# Patient Record
Sex: Male | Born: 1960 | Race: Black or African American | Hispanic: No | Marital: Married | Smoking: Former smoker
Health system: Southern US, Community
[De-identification: ages and names within clinical notes are randomized; demographics above are authoritative.]

## PROBLEM LIST (undated history)

## (undated) ENCOUNTER — Emergency Department (HOSPITAL_COMMUNITY): Discharge status: Absent without leave | Payer: 59

## (undated) DIAGNOSIS — D496 Neoplasm of unspecified behavior of brain: Secondary | ICD-10-CM

## (undated) DIAGNOSIS — M542 Cervicalgia: Secondary | ICD-10-CM

## (undated) DIAGNOSIS — E7800 Pure hypercholesterolemia, unspecified: Secondary | ICD-10-CM

## (undated) DIAGNOSIS — G4730 Sleep apnea, unspecified: Secondary | ICD-10-CM

## (undated) DIAGNOSIS — H538 Other visual disturbances: Secondary | ICD-10-CM

## (undated) DIAGNOSIS — M25519 Pain in unspecified shoulder: Secondary | ICD-10-CM

## (undated) DIAGNOSIS — I10 Essential (primary) hypertension: Secondary | ICD-10-CM

## (undated) DIAGNOSIS — R42 Dizziness and giddiness: Secondary | ICD-10-CM

## (undated) HISTORY — PX: ANKLE SURGERY: SHX546

## (undated) HISTORY — PX: SINOSCOPY: SHX187

## (undated) HISTORY — PX: OTHER SURGICAL HISTORY: SHX169

---

## 2008-03-06 HISTORY — PX: HERNIA REPAIR: SHX51

## 2012-03-06 HISTORY — PX: OTHER SURGICAL HISTORY: SHX169

## 2012-08-07 DIAGNOSIS — I10 Essential (primary) hypertension: Secondary | ICD-10-CM

## 2012-09-20 DIAGNOSIS — D329 Benign neoplasm of meninges, unspecified: Secondary | ICD-10-CM | POA: Insufficient documentation

## 2012-09-28 ENCOUNTER — Emergency Department (HOSPITAL_COMMUNITY)
Admission: EM | Admit: 2012-09-28 | Discharge: 2012-09-28 | Disposition: A | Discharge status: Home / Self Care | Payer: BC Managed Care – PPO | Attending: Emergency Medicine | Admitting: Emergency Medicine

## 2012-09-28 ENCOUNTER — Emergency Department (HOSPITAL_COMMUNITY): Payer: BC Managed Care – PPO

## 2012-09-28 ENCOUNTER — Encounter (HOSPITAL_COMMUNITY): Payer: Self-pay

## 2012-09-28 DIAGNOSIS — G9389 Other specified disorders of brain: Secondary | ICD-10-CM

## 2012-09-28 DIAGNOSIS — Z8639 Personal history of other endocrine, nutritional and metabolic disease: Secondary | ICD-10-CM | POA: Insufficient documentation

## 2012-09-28 DIAGNOSIS — Z862 Personal history of diseases of the blood and blood-forming organs and certain disorders involving the immune mechanism: Secondary | ICD-10-CM | POA: Insufficient documentation

## 2012-09-28 DIAGNOSIS — R220 Localized swelling, mass and lump, head: Secondary | ICD-10-CM | POA: Insufficient documentation

## 2012-09-28 DIAGNOSIS — Z85841 Personal history of malignant neoplasm of brain: Secondary | ICD-10-CM | POA: Insufficient documentation

## 2012-09-28 DIAGNOSIS — G936 Cerebral edema: Secondary | ICD-10-CM | POA: Insufficient documentation

## 2012-09-28 DIAGNOSIS — Z9889 Other specified postprocedural states: Secondary | ICD-10-CM | POA: Insufficient documentation

## 2012-09-28 DIAGNOSIS — G8918 Other acute postprocedural pain: Secondary | ICD-10-CM

## 2012-09-28 DIAGNOSIS — I10 Essential (primary) hypertension: Secondary | ICD-10-CM | POA: Insufficient documentation

## 2012-09-28 HISTORY — DX: Pure hypercholesterolemia, unspecified: E78.00

## 2012-09-28 HISTORY — DX: Neoplasm of unspecified behavior of brain: D49.6

## 2012-09-28 HISTORY — DX: Essential (primary) hypertension: I10

## 2012-09-28 LAB — BASIC METABOLIC PANEL
BUN: 16 mg/dL (ref 6–23)
GFR calc non Af Amer: 71 mL/min — ABNORMAL LOW (ref >90)
Glucose, Bld: 112 mg/dL — ABNORMAL HIGH (ref 70–99)
Potassium: 3.4 mEq/L — ABNORMAL LOW (ref 3.5–5.1)

## 2012-09-28 LAB — CBC WITH DIFFERENTIAL/PLATELET
Eosinophils Absolute: 0.1 10*3/uL (ref 0.0–0.7)
Eosinophils Relative: 2 % (ref 0–5)
HCT: 34.5 % — ABNORMAL LOW (ref 39.0–52.0)
Hemoglobin: 12.3 g/dL — ABNORMAL LOW (ref 13.0–17.0)
Lymphs Abs: 1.6 10*3/uL (ref 0.7–4.0)
MCH: 32.5 pg (ref 26.0–34.0)
MCV: 91 fL (ref 78.0–100.0)
Monocytes Relative: 8 % (ref 3–12)
Neutrophils Relative %: 65 % (ref 43–77)
RBC: 3.79 MIL/uL — ABNORMAL LOW (ref 4.22–5.81)

## 2012-09-28 IMAGING — CT CT HEAD W/O CM
1 series · 15 of 30 positions shown, 19 images · non-contrast
Comparison: Head CT [DATE].

CLINICAL DATA: Headache with swelling at surgical site status post
craniotomy for brain tumor removal [DATE].

CT HEAD WITHOUT CONTRAST
TECHNIQUE: Contiguous axial images were obtained from the base of
the skull through the vertex without contrast.

[Series 2: headseq 4.8 h37s · axial · 0.46mm/px · z∈[+143,+301]mm · 15 of 36 slices shown, 19 images]
[im 2/36  brain]
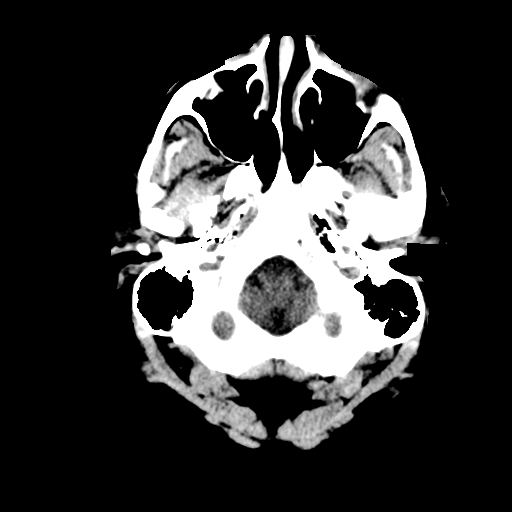
[im 2/36  bone]
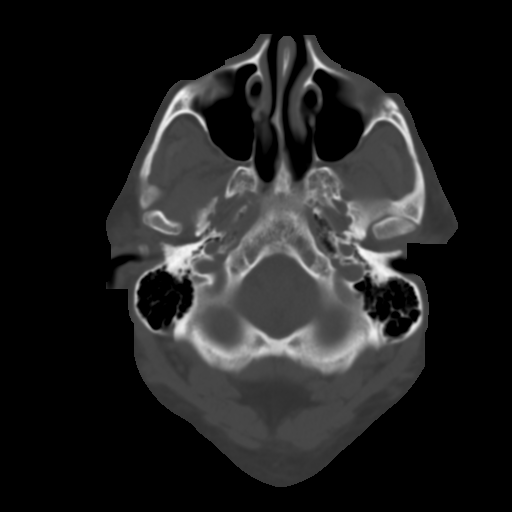
[im 4/36  brain]
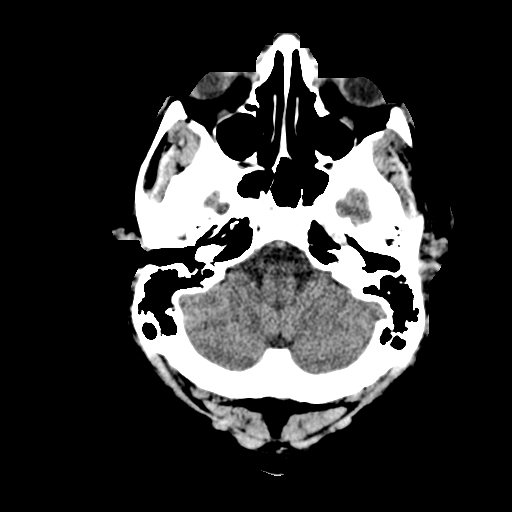
[im 7/36  brain]
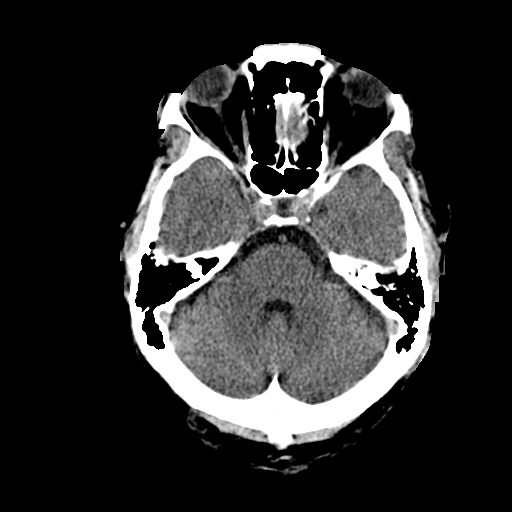
[im 9/36  brain]
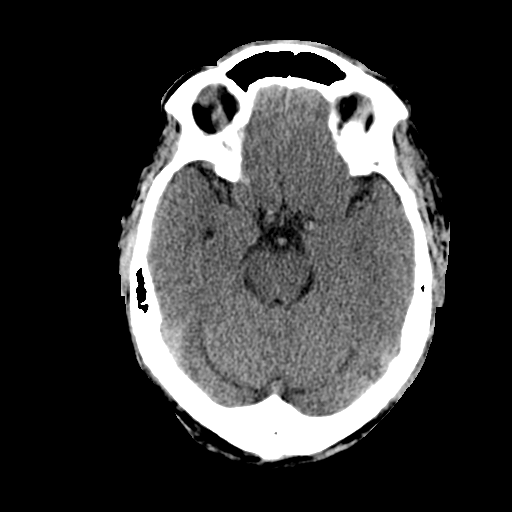
[im 11/36  brain]
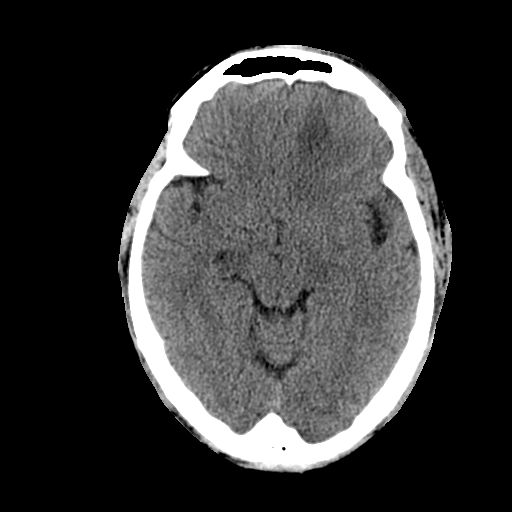
[im 11/36  bone]
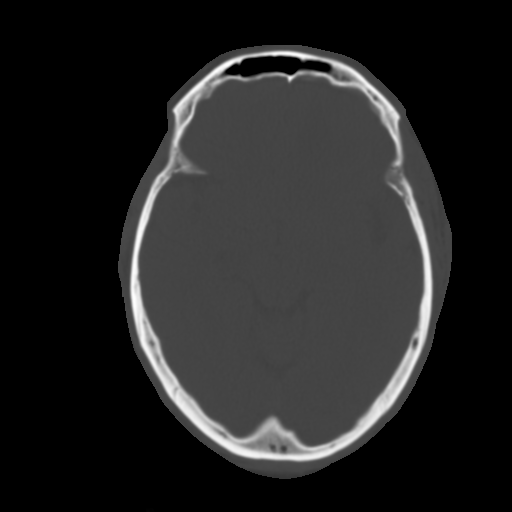
[im 14/36  brain]
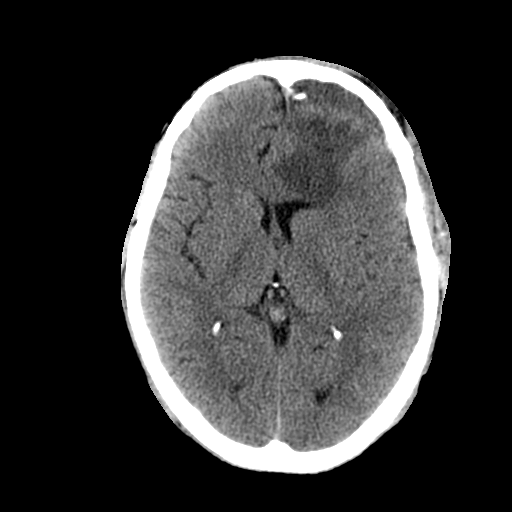
[im 16/36  brain]
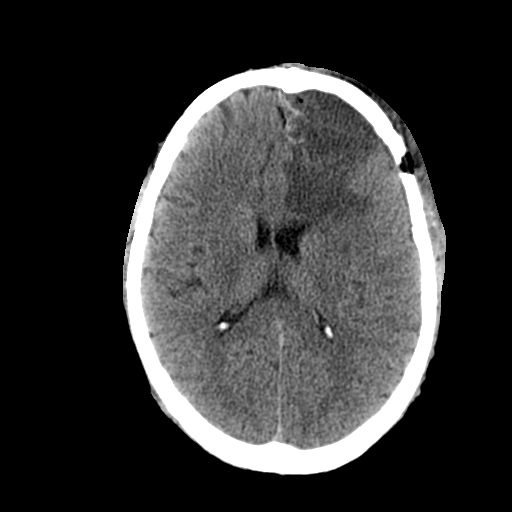
[im 19/36  brain]
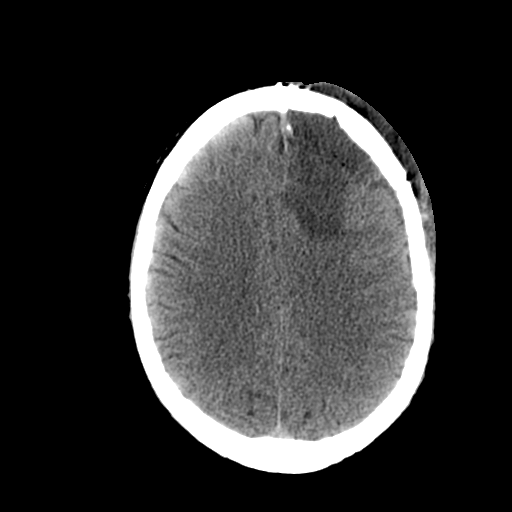
[im 20/36  brain]
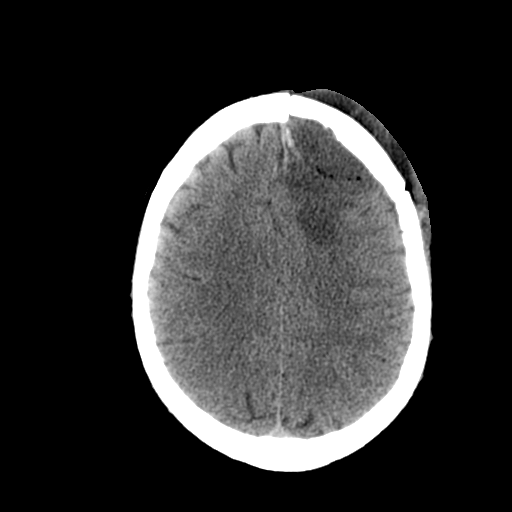
[im 20/36  bone]
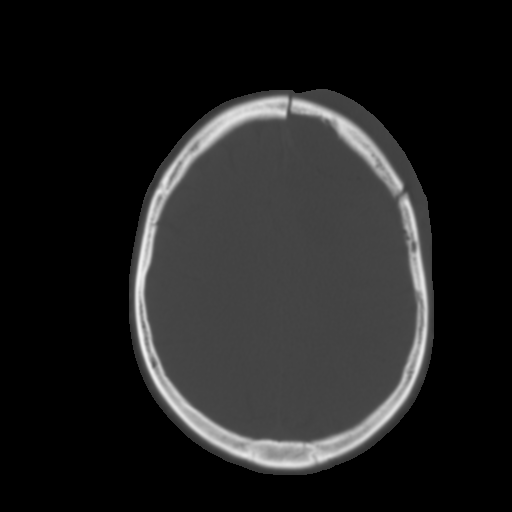
[im 22/36  brain]
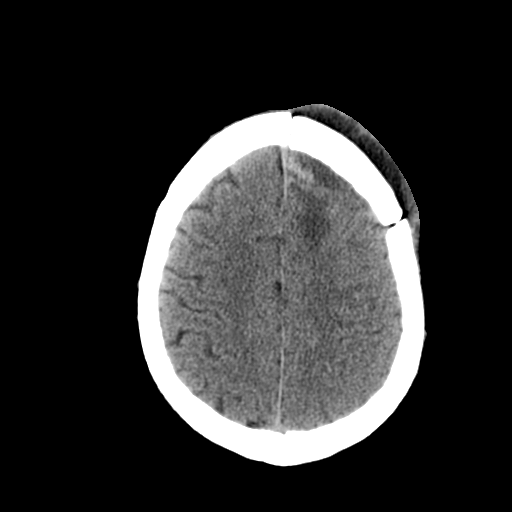
[im 25/36  brain]
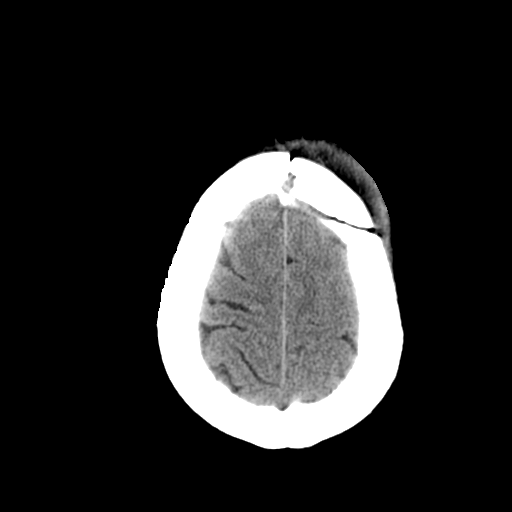
[im 27/36  brain]
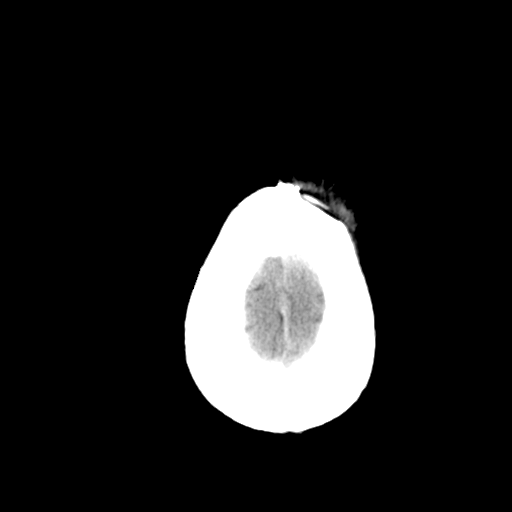
[im 29/36  brain]
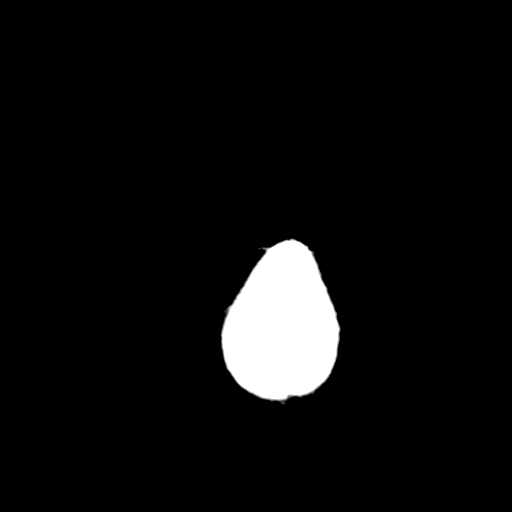
[im 29/36  bone]
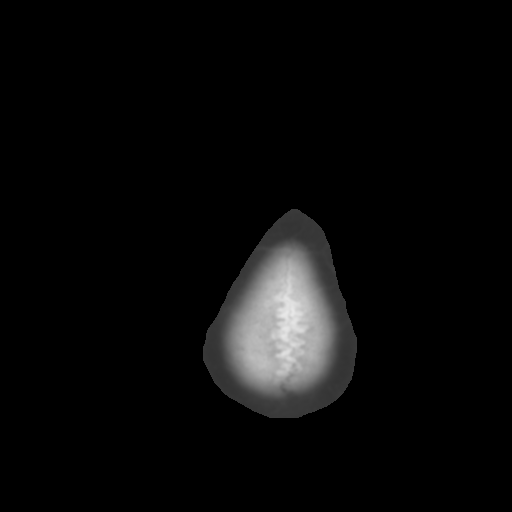
[im 32/36  brain]
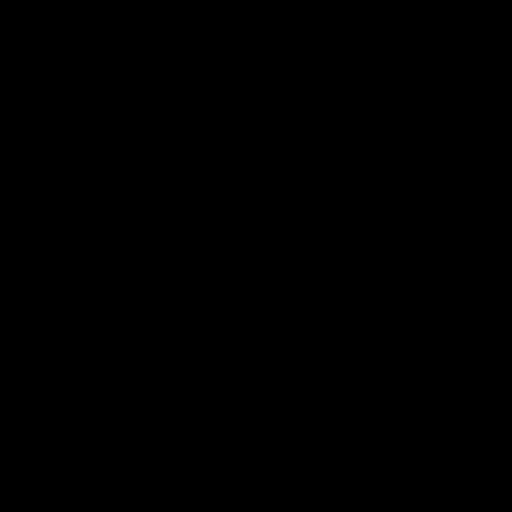
[im 34/36  brain]
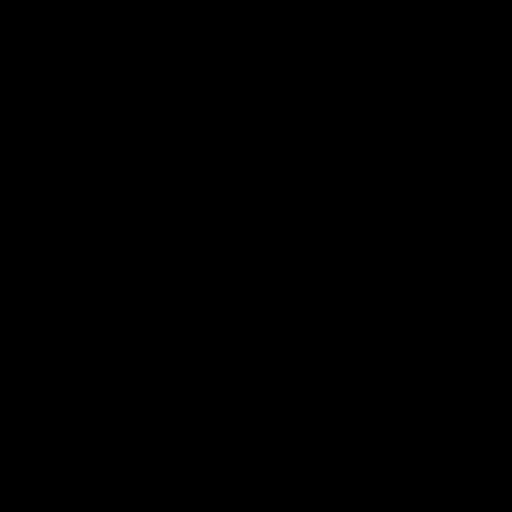

[15 of 30 positions shown; findings below may reference images not displayed]

FINDINGS: Patient is status post interval left frontal craniotomy
for resection of a hyperdense left frontal lobe mass.  There is
residual low density within the left frontal white matter.  There
is some residual left to right midline shift, now measuring 4 mm.
At the site of the resected tumor, there is an extra-axial fluid
collection measuring 1.7 x 4.7 cm on image 20.  There are small air
bubbles within this collection.  There is no high density component
to suggest recent hemorrhage.  There is an adjacent subgaleal fluid
collection in the left frontal scalp measuring up to 8 mm in
thickness.

No additional extra-axial fluid collections are demonstrated.
There is no evidence of acute infarction or hydrocephalus.  The
visualized paranasal sinuses, mastoid air cells and middle ears are
clear.  No osseous destruction is identified at the craniotomy.
IMPRESSION: 1.  Interval craniotomy for resection of a left frontal lobe mass.
2.  Lenticular shaped extra-axial fluid collection within the
craniotomy bed contains small air bubbles and is associated with an
adjacent subgaleal fluid collection.  These findings are suspicious
for possible postoperative infection with epidural abscess
formation.
3.  No evidence of acute intracranial hemorrhage or progressive
mass effect.

These results were called by telephone on [DATE] at [4R] hours
to Dr. FRUSTI, who verbally acknowledged these results.

## 2012-09-28 MED ORDER — ACETAMINOPHEN 325 MG PO TABS
650.0000 mg | ORAL_TABLET | Freq: Once | ORAL | Status: AC
  Administered 2012-09-28: 650 mg via ORAL
  Filled 2012-09-28: qty 2

## 2012-09-28 NOTE — ED Notes (Signed)
Pt had removal of a brain tumor on July 8th at [facility].  [place] were removed July 18th.  Pt noticed swelling around incision worse since last night.  Also c/o pain at the site and intermittent sharp pain in left eye.  Pt has problems with dizziness and walking, but is not new since the surgery per EMS.

## 2012-09-28 NOTE — ED Provider Notes (Signed)
CSN: [id number]     Arrival date & time 09/28/12  1432 History  This chart was scribed for Enid Skeens, MD by Bennett Scrape, ED Scribe. This patient was seen in room APA07/APA07 and the patient's care was started at 3:00 PM.   First MD Initiated Contact with Patient 09/28/12 1457     Chief Complaint  Patient presents with  . Edema    The history is provided by the patient. No language interpreter was used.    HPI Comments: Micheal Ross is a 52 y.o. male brought in by ambulance, who presents to the Emergency Department complaining of waxing and waning angioedema around the left forehead and perioribtal region near a brain surgery site that started overnight last night. Pt denies having prior episodes of similar symptoms. He recently had a brain tumor removed on 09/10/12 at [facility] with the staples removed on 09/20/12 with no complications. He states that he has ongoing intermittently felt pain behind the left eye described as sharp and stabbing since the surgery but denies any changes. He has a follow up appointment on 09/26/12 and denies any changes around the surgery site until today. He denies weakness, numbness, urinary symptoms, visual changes, rash, itching, fevers or chills as associated symptoms. He denies having a h/o CHF or kidney disease. Pt denies smoking and alcohol use.  Past Medical History  Diagnosis Date  . Brain tumor   . Hypertension   . Hypercholesterolemia    Past Surgical History  Procedure Laterality Date  . Brain tumor removal    . Hernia repair    . Ankle surgery     No family history on file. History  Substance Use Topics  . Smoking status: Never Smoker   . Smokeless tobacco: Not on file  . Alcohol Use: No    Review of Systems  HENT: Positive for facial swelling.   Eyes: Negative for visual disturbance.  Gastrointestinal: Negative for abdominal pain.  Neurological: Negative for speech difficulty, weakness and numbness.  All other systems  reviewed and are negative.    Allergies  Review of patient's allergies indicates no known allergies.  Home Medications  No current outpatient prescriptions on file.  Triage Vitals: BP 141/89  Pulse 111  Temp(Src) 98.8 F (37.1 C) (Oral)  Resp 23  SpO2 96%  Physical Exam  Nursing note and vitals reviewed. Constitutional: He is oriented to person, place, and time. He appears well-developed and well-nourished. No distress.  HENT:  Head: Atraumatic.  Swelling and mild tenderness to the left forehead that extends to the left upper orbit  Eyes: Pupils are equal, round, and reactive to light.  Neck: Neck supple. No tracheal deviation present.  Cardiovascular: Normal rate and regular rhythm.   No murmur heard. Pulmonary/Chest: Effort normal and breath sounds normal. No respiratory distress. He has no wheezes.  Abdominal: Soft. Bowel sounds are normal. There is no tenderness.  Musculoskeletal: Normal range of motion. He exhibits edema (no ankle swelling). He exhibits no tenderness.  Neurological: He is alert and oriented to person, place, and time. No cranial nerve deficit or sensory deficit. Coordination normal. GCS eye subscore is 4. GCS verbal subscore is 5. GCS motor subscore is 6.  No arm drift, sensation is grossly intact, equal smile  Skin: Skin is warm and dry. No rash noted.  Psychiatric: He has a normal mood and affect. His behavior is normal.    ED Course   Procedures (including critical care time)  DIAGNOSTIC STUDIES: Oxygen Saturation is  96% on room air, normal by my interpretation.    COORDINATION OF CARE: 3:03 PM-Advised pt that the symptoms are most likely changes from surgery. Discussed treatment plan which includes CT of head with pt at bedside and pt agreed to plan. Advised pt that he will have to follow up as scheduled and pt agreed.  Labs Reviewed  CBC WITH DIFFERENTIAL - Abnormal; Notable for the following:    RBC 3.79 (*)    Hemoglobin 12.3 (*)    HCT  34.5 (*)    All other components within normal limits  BASIC METABOLIC PANEL - Abnormal; Notable for the following:    Potassium 3.4 (*)    Glucose, Bld 112 (*)    GFR calc non Af Amer 71 (*)    GFR calc Af Amer 82 (*)    All other components within normal limits  CULTURE, BLOOD (ROUTINE X 2)  CULTURE, BLOOD (ROUTINE X 2)   No results found. No diagnosis found.  MDM  I personally performed the services described in this documentation, which was scribed in my presence. The recorded information has been reviewed and is accurate.  Pt well appearing, mild fluid collection without clinical signs of infection. No fevers, normal WBC.  Spoke with radiologist concern for post op fluid collection vs abscess/ infection.  Spoke with NSGY on call at [facility], this is common on CT, as long as no clinical signs of infection will fup in office, no ax at this time, no transfer.   Updated pt.  Well appearing.  DC Ct Head Wo Contrast  09/28/2012   *RADIOLOGY REPORT*  Clinical Data: Headache with swelling at surgical site status post craniotomy for brain tumor removal 09/10/2012.  CT HEAD WITHOUT CONTRAST  Technique:  Contiguous axial images were obtained from the base of the skull through the vertex without contrast.  Comparison: Head CT 09/08/2012.  Findings: Patient is status post interval left frontal craniotomy for resection of a hyperdense left frontal lobe mass.  There is residual low density within the left frontal white matter.  There is some residual left to right midline shift, now measuring 4 mm. At the site of the resected tumor, there is an extra-axial fluid collection measuring 1.7 x 4.7 cm on image 20.  There are small air bubbles within this collection.  There is no high density component to suggest recent hemorrhage.  There is an adjacent subgaleal fluid collection in the left frontal scalp measuring up to 8 mm in thickness.  No additional extra-axial fluid collections are demonstrated. There is  no evidence of acute infarction or hydrocephalus.  The visualized paranasal sinuses, mastoid air cells and middle ears are clear.  No osseous destruction is identified at the craniotomy.  IMPRESSION:  1.  Interval craniotomy for resection of a left frontal lobe mass. 2.  Lenticular shaped extra-axial fluid collection within the craniotomy bed contains small air bubbles and is associated with an adjacent subgaleal fluid collection.  These findings are suspicious for possible postoperative infection with epidural abscess formation. 3.  No evidence of acute intracranial hemorrhage or progressive mass effect.  These results were called by telephone on 09/28/2012 at 1625 hours to Dr. Jodi Mourning, who verbally acknowledged these results.   Original Report Authenticated By: Carey Bullocks, M.D.    Enid Skeens, MD 09/28/12 [phone]

## 2012-09-28 NOTE — ED Notes (Signed)
Pt presents with c/o post-op swelling/edema at frontal scalp incision that was placed on 7/8 and staple removal 7/18. Pt ambulates with unsteady gait, spouse reports pts gait is improving as well is his speech.  Denies N/V and fever at this time. PERTL. NAD noted

## 2012-10-03 LAB — CULTURE, BLOOD (ROUTINE X 2)
Culture: NO GROWTH
Culture: NO GROWTH

## 2017-04-20 DIAGNOSIS — R197 Diarrhea, unspecified: Secondary | ICD-10-CM | POA: Insufficient documentation

## 2017-04-20 DIAGNOSIS — J101 Influenza due to other identified influenza virus with other respiratory manifestations: Secondary | ICD-10-CM | POA: Insufficient documentation

## 2017-04-20 DIAGNOSIS — J159 Unspecified bacterial pneumonia: Secondary | ICD-10-CM | POA: Insufficient documentation

## 2018-03-22 ENCOUNTER — Ambulatory Visit: Payer: Self-pay | Admitting: Family Medicine

## 2018-03-29 ENCOUNTER — Ambulatory Visit (INDEPENDENT_AMBULATORY_CARE_PROVIDER_SITE_OTHER): Payer: PRIVATE HEALTH INSURANCE | Admitting: Family Medicine

## 2018-03-29 ENCOUNTER — Encounter: Payer: Self-pay | Admitting: Family Medicine

## 2018-03-29 VITALS — BP 119/72 | HR 80 | Temp 97.0°F | Ht 70.0 in | Wt 321.6 lb

## 2018-03-29 DIAGNOSIS — Z125 Encounter for screening for malignant neoplasm of prostate: Secondary | ICD-10-CM

## 2018-03-29 DIAGNOSIS — Z1159 Encounter for screening for other viral diseases: Secondary | ICD-10-CM

## 2018-03-29 DIAGNOSIS — E782 Mixed hyperlipidemia: Secondary | ICD-10-CM

## 2018-03-29 DIAGNOSIS — I10 Essential (primary) hypertension: Secondary | ICD-10-CM | POA: Insufficient documentation

## 2018-03-29 DIAGNOSIS — E785 Hyperlipidemia, unspecified: Secondary | ICD-10-CM | POA: Insufficient documentation

## 2018-03-29 MED ORDER — LOVASTATIN 20 MG PO TABS: 20.0000 mg | ORAL_TABLET | Freq: Every morning | ORAL | 1 refills | Status: DC

## 2018-03-29 MED ORDER — LISINOPRIL-HYDROCHLOROTHIAZIDE 20-25 MG PO TABS: 1.0000 | ORAL_TABLET | Freq: Every day | ORAL | 1 refills | Status: DC

## 2018-03-29 NOTE — Progress Notes (Signed)
BP 119/72   Pulse 80   Temp (!) 97 F (36.1 C) (Oral)   Ht 5' 10"  (1.778 m)   Wt (!) 321 lb 9.6 oz (145.9 kg)   BMI 46.14 kg/m    Subjective:    Patient ID: Micheal Sabal., male    DOB: April 02, 1960, 58 y.o.   MRN: [id number]  HPI: Micheal Banks. is a 58 y.o. male presenting on 03/29/2018 for New Patient (Initial Visit) (Patient has no complaints today- normal check up . Patient is fasting for lab work.) and Establish Care   HPI Hypertension Patient is currently on lisinopril hydrochlorothiazide, and their blood pressure today is 119/72. Patient denies any lightheadedness or dizziness. Patient denies headaches, blurred vision, chest pains, shortness of breath, or weakness. Denies any side effects from medication and is content with current medication.   Hyperlipidemia Patient is coming in for recheck of his hyperlipidemia. The patient is currently taking lovastatin. They deny any issues with myalgias or history of liver damage from it. They deny any focal numbness or weakness or chest pain.   Possible hepatitis C exposure, his wife was just diagnosed with hepatitis C chronic and they have been married for 36 years so he does not know if he has been exposed to or not and has never been tested.  Relevant past medical, surgical, family and social history reviewed and updated as indicated. Interim medical history since our last visit reviewed. Allergies and medications reviewed and updated.  Review of Systems  Constitutional: Negative for chills and fever.  HENT: Negative for ear pain and tinnitus.   Eyes: Negative for pain and discharge.  Respiratory: Negative for cough, shortness of breath and wheezing.   Cardiovascular: Negative for chest pain, palpitations and leg swelling.  Gastrointestinal: Negative for abdominal pain, blood in stool, constipation and diarrhea.  Genitourinary: Negative for dysuria and hematuria.  Musculoskeletal: Negative for back pain, gait problem  and myalgias.  Skin: Negative for rash.  Neurological: Negative for dizziness, weakness and headaches.  Psychiatric/Behavioral: Negative for suicidal ideas.  All other systems reviewed and are negative.  Per HPI unless specifically indicated above  Social History   Socioeconomic History  . Marital status: Married    Spouse name: Not on file  . Number of children: Not on file  . Years of education: Not on file  . Highest education level: Not on file  Occupational History  . Not on file  Social Needs  . Financial resource strain: Not on file  . Food insecurity:    Worry: Not on file    Inability: Not on file  . Transportation needs:    Medical: Not on file    Non-medical: Not on file  Tobacco Use  . Smoking status: Former Smoker    Packs/day: 0.50    Years: 2.00    Pack years: 1.00    Last attempt to quit: 03/30/1983    Years since quitting: 35.0  . Smokeless tobacco: Never Used  Substance and Sexual Activity  . Alcohol use: Yes    Comment: occ  . Drug use: No  . Sexual activity: Not on file    Comment: married for 1985, 6 grown kids  Lifestyle  . Physical activity:    Days per week: Not on file    Minutes per session: Not on file  . Stress: Not on file  Relationships  . Social connections:    Talks on phone: Not on file    Gets together: Not  on file    Attends religious service: Not on file    Active member of club or organization: Not on file    Attends meetings of clubs or organizations: Not on file    Relationship status: Not on file  . Intimate partner violence:    Fear of current or ex partner: Not on file    Emotionally abused: Not on file    Physically abused: Not on file    Forced sexual activity: Not on file  Other Topics Concern  . Not on file  Social History Narrative  . Not on file    Past Surgical History:  Procedure Laterality Date  . ANKLE SURGERY    . brain tumor removal    . HERNIA REPAIR  9371   umbilical    Family History    Problem Relation Age of Onset  . Thyroid disease Mother   . COPD Father   . Cancer Maternal Aunt   . Cancer Maternal Uncle        back  . Cancer Paternal Uncle        throat   . Cancer Cousin        prostate    Allergies as of 03/29/2018   No Known Allergies     Medication List       Accurate as of March 29, 2018 10:30 AM. Always use your most recent med list.        lisinopril-hydrochlorothiazide 20-25 MG tablet Commonly known as:  PRINZIDE,ZESTORETIC Take 1 tablet by mouth daily.   lovastatin 20 MG tablet Commonly known as:  MEVACOR Take 1 tablet (20 mg total) by mouth every morning.   ONE-A-DAY MENS 50+ ADVANTAGE Tabs Take 1 tablet by mouth daily.          Objective:    BP 119/72   Pulse 80   Temp (!) 97 F (36.1 C) (Oral)   Ht 5' 10"  (1.778 m)   Wt (!) 321 lb 9.6 oz (145.9 kg)   BMI 46.14 kg/m   Wt Readings from Last 3 Encounters:  03/29/18 (!) 321 lb 9.6 oz (145.9 kg)    Physical Exam Vitals signs reviewed.  Constitutional:      General: He is not in acute distress.    Appearance: He is well-developed. He is not diaphoretic.  HENT:     Right Ear: External ear normal.     Left Ear: External ear normal.     Nose: Nose normal.     Mouth/Throat:     Pharynx: No oropharyngeal exudate.  Eyes:     General: No scleral icterus.    Conjunctiva/sclera: Conjunctivae normal.  Neck:     Musculoskeletal: Neck supple.     Thyroid: No thyromegaly.  Cardiovascular:     Rate and Rhythm: Normal rate and regular rhythm.     Heart sounds: Normal heart sounds. No murmur.  Pulmonary:     Effort: Pulmonary effort is normal. No respiratory distress.     Breath sounds: Normal breath sounds. No wheezing.  Abdominal:     General: Bowel sounds are normal. There is no distension.     Palpations: Abdomen is soft.     Tenderness: There is no abdominal tenderness. There is no guarding or rebound.  Musculoskeletal: Normal range of motion.  Lymphadenopathy:      Cervical: No cervical adenopathy.  Skin:    General: Skin is warm and dry.     Findings: No rash.  Neurological:  Mental Status: He is alert and oriented to person, place, and time.     Coordination: Coordination normal.  Psychiatric:        Behavior: Behavior normal.        Assessment & Plan:   Problem List Items Addressed This Visit      Cardiovascular and Mediastinum   Hypertension - Primary   Relevant Medications   lisinopril-hydrochlorothiazide (PRINZIDE,ZESTORETIC) 20-25 MG tablet   lovastatin (MEVACOR) 20 MG tablet   Other Relevant Orders   CBC with Differential/Platelet   CMP14+EGFR     Other   Hyperlipidemia   Relevant Medications   lisinopril-hydrochlorothiazide (PRINZIDE,ZESTORETIC) 20-25 MG tablet   lovastatin (MEVACOR) 20 MG tablet   Other Relevant Orders   Lipid panel    Other Visit Diagnoses    Need for hepatitis C screening test       Relevant Orders   Hepatitis C antibody   Prostate cancer screening       Relevant Orders   PSA, total and free       Follow up plan: Return in about 1 year (around 03/30/2019), or if symptoms worsen or fail to improve, for Hypertension cholesterol and physical.  Caryl Pina, MD [facility] 03/29/2018, 10:30 AM

## 2018-03-30 LAB — CMP14+EGFR
A/G RATIO: 2 (ref 1.2–2.2)
ALT: 29 IU/L (ref 0–44)
AST: 25 IU/L (ref 0–40)
Albumin: 4.8 g/dL (ref 3.8–4.9)
Alkaline Phosphatase: 66 IU/L (ref 39–117)
BUN/Creatinine Ratio: 10 (ref 9–20)
BUN: 11 mg/dL (ref 6–24)
Bilirubin Total: 0.4 mg/dL (ref 0.0–1.2)
CALCIUM: 9.8 mg/dL (ref 8.7–10.2)
CO2: 25 mmol/L (ref 20–29)
Chloride: 100 mmol/L (ref 96–106)
Creatinine, Ser: 1.14 mg/dL (ref 0.76–1.27)
GFR, EST AFRICAN AMERICAN: 81 mL/min/{1.73_m2} (ref >59)
GFR, EST NON AFRICAN AMERICAN: 70 mL/min/{1.73_m2} (ref >59)
Globulin, Total: 2.4 g/dL (ref 1.5–4.5)
Glucose: 95 mg/dL (ref 65–99)
POTASSIUM: 4.1 mmol/L (ref 3.5–5.2)
Sodium: 142 mmol/L (ref 134–144)
TOTAL PROTEIN: 7.2 g/dL (ref 6.0–8.5)

## 2018-03-30 LAB — LIPID PANEL
Chol/HDL Ratio: 4.1 ratio (ref 0.0–5.0)
Cholesterol, Total: 169 mg/dL (ref 100–199)
HDL: 41 mg/dL (ref >39)
LDL CALC: 109 mg/dL — AB (ref 0–99)
Triglycerides: 97 mg/dL (ref 0–149)
VLDL CHOLESTEROL CAL: 19 mg/dL (ref 5–40)

## 2018-03-30 LAB — CBC WITH DIFFERENTIAL/PLATELET
Basophils Absolute: 0 10*3/uL (ref 0.0–0.2)
Basos: 1 %
EOS (ABSOLUTE): 0.1 10*3/uL (ref 0.0–0.4)
Eos: 2 %
Hematocrit: 40.3 % (ref 37.5–51.0)
Hemoglobin: 13.6 g/dL (ref 13.0–17.7)
IMMATURE GRANULOCYTES: 0 %
Immature Grans (Abs): 0 10*3/uL (ref 0.0–0.1)
LYMPHS ABS: 1.3 10*3/uL (ref 0.7–3.1)
LYMPHS: 29 %
MCH: 31.5 pg (ref 26.6–33.0)
MCHC: 33.7 g/dL (ref 31.5–35.7)
MCV: 93 fL (ref 79–97)
MONOCYTES: 8 %
Monocytes Absolute: 0.4 10*3/uL (ref 0.1–0.9)
NEUTROS PCT: 60 %
Neutrophils Absolute: 2.7 10*3/uL (ref 1.4–7.0)
PLATELETS: 219 10*3/uL (ref 150–450)
RBC: 4.32 x10E6/uL (ref 4.14–5.80)
RDW: 13.2 % (ref 11.6–15.4)
WBC: 4.5 10*3/uL (ref 3.4–10.8)

## 2018-03-30 LAB — HEPATITIS C ANTIBODY: Hep C Virus Ab: <0.1 s/co ratio (ref 0.0–0.9)

## 2018-03-30 LAB — PSA, TOTAL AND FREE
PROSTATE SPECIFIC AG, SERUM: 0.7 ng/mL (ref 0.0–4.0)
PSA, Free Pct: 52.9 %
PSA, Free: 0.37 ng/mL

## 2018-07-15 ENCOUNTER — Telehealth: Payer: Self-pay | Admitting: Family Medicine

## 2018-07-15 ENCOUNTER — Other Ambulatory Visit: Payer: Self-pay

## 2018-07-15 ENCOUNTER — Ambulatory Visit (INDEPENDENT_AMBULATORY_CARE_PROVIDER_SITE_OTHER): Payer: PRIVATE HEALTH INSURANCE | Admitting: Family Medicine

## 2018-07-15 ENCOUNTER — Encounter: Payer: Self-pay | Admitting: Family Medicine

## 2018-07-15 DIAGNOSIS — H9313 Tinnitus, bilateral: Secondary | ICD-10-CM | POA: Diagnosis not present

## 2018-07-15 DIAGNOSIS — R0981 Nasal congestion: Secondary | ICD-10-CM

## 2018-07-15 DIAGNOSIS — K219 Gastro-esophageal reflux disease without esophagitis: Secondary | ICD-10-CM | POA: Diagnosis not present

## 2018-07-15 MED ORDER — CETIRIZINE HCL 10 MG PO TABS: 10.0000 mg | ORAL_TABLET | Freq: Every day | ORAL | 1 refills | Status: DC

## 2018-07-15 MED ORDER — FLUTICASONE PROPIONATE 50 MCG/ACT NA SUSP: 1.0000 | Freq: Two times a day (BID) | NASAL | 6 refills | Status: DC | PRN

## 2018-07-15 MED ORDER — FAMOTIDINE 20 MG PO TABS: 20.0000 mg | ORAL_TABLET | Freq: Two times a day (BID) | ORAL | 1 refills | Status: DC

## 2018-07-15 NOTE — Telephone Encounter (Signed)
Aware. 

## 2018-07-15 NOTE — Progress Notes (Signed)
Virtual Visit via telephone Note  I connected with Micheal Ross. on 07/15/18 at 1438 by telephone and verified that I am speaking with the correct person using two identifiers. Micheal Ross. is currently located at home and no other people are currently with her during visit. The provider, Fransisca Kaufmann Sheikh Leverich, MD is located in their office at time of visit.  Call ended at 1451  I discussed the limitations, risks, security and privacy concerns of performing an evaluation and management service by telephone and the availability of in person appointments. I also discussed with the patient that there may be a patient responsible charge related to this service. The patient expressed understanding and agreed to proceed.   History and Present Illness: Patient has been a having a loud ringing in Micheal Ross head and ears.  Sometimes it gets louder.  He has been having nasal congestion and cough with mucous at night.  The cough is improving but still has been causing him issues.  Hs upper part of Micheal Ross stomach has been bothering him over the past 2 weeks.  Denies n/v/d/c.  He gets orthostatic hypotension sometimes.  He denies fevers or chills.  No diagnosis found.  Outpatient Encounter Medications as of 07/15/2018  Medication Sig  . lisinopril-hydrochlorothiazide (PRINZIDE,ZESTORETIC) 20-25 MG tablet Take 1 tablet by mouth daily.  [name] Kitchen lovastatin (MEVACOR) 20 MG tablet Take 1 tablet (20 mg total) by mouth every morning.  . Multiple Vitamins-Minerals (ONE-A-DAY MENS 50+ ADVANTAGE) TABS Take 1 tablet by mouth daily.   No facility-administered encounter medications on file as of 07/15/2018.     Review of Systems  Constitutional: Negative for chills and fever.  HENT: Positive for congestion, sinus pressure and tinnitus. Negative for sinus pain, sneezing and sore throat.   Respiratory: Positive for cough. Negative for shortness of breath and wheezing.   Cardiovascular: Negative for chest pain and leg  swelling.  Musculoskeletal: Negative for back pain and gait problem.  Skin: Negative for rash.  All other systems reviewed and are negative.   Observations/Objective: Patient sounds comfortable and in no acute distress  Assessment and Plan: Problem List Items Addressed This Visit    None    Visit Diagnoses    Tinnitus of both ears    -  Primary   Relevant Medications   cetirizine (ZYRTEC) 10 MG tablet   fluticasone (FLONASE) 50 MCG/ACT nasal spray   Gastroesophageal reflux disease without esophagitis       Relevant Medications   famotidine (PEPCID) 20 MG tablet   Sinus congestion       Relevant Orders   MyChart Temperature FLOWSHEET   MYCHART COVID-19 HOME MONITORING PROGRAM   Temperature monitoring       Follow Up Instructions:  There is a possibility coronavirus so recommended quarantine  Ordered monitoring for covid   I discussed the assessment and treatment plan with the patient. The patient was provided an opportunity to ask questions and all were answered. The patient agreed with the plan and demonstrated an understanding of the instructions.   The patient was advised to call back or seek an in-person evaluation if the symptoms worsen or if the condition fails to improve as anticipated.  The above assessment and management plan was discussed with the patient. The patient verbalized understanding of and has agreed to the management plan. Patient is aware to call the clinic if symptoms persist or worsen. Patient is aware when to return to the clinic for a follow-up visit. Patient educated  on when it is appropriate to go to the emergency department.    I provided 13 minutes of non-face-to-face time during this encounter.    [name], MD

## 2018-07-25 ENCOUNTER — Encounter: Payer: Self-pay | Admitting: Family Medicine

## 2018-07-25 ENCOUNTER — Ambulatory Visit (INDEPENDENT_AMBULATORY_CARE_PROVIDER_SITE_OTHER): Payer: PRIVATE HEALTH INSURANCE | Admitting: Family Medicine

## 2018-07-25 ENCOUNTER — Other Ambulatory Visit: Payer: Self-pay

## 2018-07-25 ENCOUNTER — Telehealth: Payer: Self-pay | Admitting: Hematology

## 2018-07-25 DIAGNOSIS — R0981 Nasal congestion: Secondary | ICD-10-CM

## 2018-07-25 DIAGNOSIS — H9313 Tinnitus, bilateral: Secondary | ICD-10-CM

## 2018-07-25 DIAGNOSIS — Z20822 Contact with and (suspected) exposure to covid-19: Secondary | ICD-10-CM

## 2018-07-25 NOTE — Telephone Encounter (Signed)
Request received from Dr. Warrick Parisian for [place] testing / Screening ordered for 07-26-2018 at 8:30 /

## 2018-07-25 NOTE — Progress Notes (Signed)
Virtual Visit via telephone Note  I connected with Micheal Ross. on 07/25/18 at 1540 by telephone and verified that I am speaking with the correct person using two identifiers. Micheal Ross. is currently located at home and no other people are currently with her during visit. The provider, Fransisca Kaufmann Ray Gervasi, MD is located in their office at time of visit.  Call ended at 1558  I discussed the limitations, risks, security and privacy concerns of performing an evaluation and management service by telephone and the availability of in person appointments. I also discussed with the patient that there may be a patient responsible charge related to this service. The patient expressed understanding and agreed to proceed.   History and Present Illness: Ringing is still there but sinus pressure is gone and headaches are gone but still having cough and phlegm.  Patient denies any fevers or chills.  His indigestion is much better and the pain in his stomach.  He was seen 07/15/2018 for this and is still having cough that is productive.  He does not feel short of breath or wheezing.  He does not have as much of a headache or sinus congestion and that is been improving.  No diagnosis found.  Outpatient Encounter Medications as of 07/25/2018  Medication Sig  . aspirin EC 81 MG tablet Take 81 mg by mouth every other day.  . cetirizine (ZYRTEC) 10 MG tablet Take 1 tablet (10 mg total) by mouth daily.  . famotidine (PEPCID) 20 MG tablet Take 1 tablet (20 mg total) by mouth 2 (two) times daily.  . fluticasone (FLONASE) 50 MCG/ACT nasal spray Place 1 spray into both nostrils 2 (two) times daily as needed for allergies or rhinitis.  [name] Kitchen lisinopril-hydrochlorothiazide (PRINZIDE,ZESTORETIC) 20-25 MG tablet Take 1 tablet by mouth daily.  [name] Kitchen lovastatin (MEVACOR) 20 MG tablet Take 1 tablet (20 mg total) by mouth every morning.  . Multiple Vitamins-Minerals (ONE-A-DAY MENS 50+ ADVANTAGE) TABS Take 1 tablet by  mouth daily.   No facility-administered encounter medications on file as of 07/25/2018.     Review of Systems  Constitutional: Negative for chills and fever.  HENT: Positive for congestion, postnasal drip, rhinorrhea, sinus pressure, sneezing and sore throat. Negative for ear discharge, ear pain and voice change.   Eyes: Negative for pain, discharge, redness and visual disturbance.  Respiratory: Positive for cough. Negative for shortness of breath and wheezing.   Cardiovascular: Negative for chest pain and leg swelling.  Musculoskeletal: Negative for back pain and gait problem.  Skin: Negative for rash.  All other systems reviewed and are negative.   Observations/Objective: Patient sounds comfortable on the phone and in no acute distress  Assessment and Plan: Problem List Items Addressed This Visit    None    Visit Diagnoses    Tinnitus of both ears    -  Primary   Sinus congestion           Follow Up Instructions:  Will refer patient to the [facility] for possible coronavirus testing because they are testing almost everybody that is symptomatic.   I discussed the assessment and treatment plan with the patient. The patient was provided an opportunity to ask questions and all were answered. The patient agreed with the plan and demonstrated an understanding of the instructions.   The patient was advised to call back or seek an in-person evaluation if the symptoms worsen or if the condition fails to improve as anticipated.  The above assessment and  management plan was discussed with the patient. The patient verbalized understanding of and has agreed to the management plan. Patient is aware to call the clinic if symptoms persist or worsen. Patient is aware when to return to the clinic for a follow-up visit. Patient educated on when it is appropriate to go to the emergency department.    I provided 18 minutes of non-face-to-face time during this encounter.    [name],  MD

## 2018-07-26 ENCOUNTER — Other Ambulatory Visit: Payer: Self-pay | Admitting: Family Medicine

## 2018-07-26 ENCOUNTER — Ambulatory Visit: Payer: PRIVATE HEALTH INSURANCE | Admitting: Family Medicine

## 2018-07-26 ENCOUNTER — Other Ambulatory Visit: Payer: Self-pay

## 2018-07-26 DIAGNOSIS — Z20822 Contact with and (suspected) exposure to covid-19: Secondary | ICD-10-CM

## 2018-07-31 ENCOUNTER — Telehealth: Payer: Self-pay | Admitting: Family Medicine

## 2018-07-31 LAB — NOVEL CORONAVIRUS, NAA: SARS-CoV-2, NAA: NOT DETECTED

## 2018-07-31 NOTE — Telephone Encounter (Signed)
Patient aware results are not back yet.

## 2018-08-01 ENCOUNTER — Telehealth: Payer: Self-pay | Admitting: Family Medicine

## 2018-08-01 NOTE — Telephone Encounter (Signed)
Letter faxed- patient aware.  

## 2018-08-01 NOTE — Telephone Encounter (Signed)
Please have your nurse address.

## 2018-08-01 NOTE — Telephone Encounter (Signed)
Yes that is fine, we can do a letter for his work stating that the results for the COVID-19 test were negative and that if he is asymptomatic with no further respiratory symptoms he can return to work.

## 2018-08-17 ENCOUNTER — Other Ambulatory Visit: Payer: Self-pay | Admitting: Family Medicine

## 2018-08-17 DIAGNOSIS — H9313 Tinnitus, bilateral: Secondary | ICD-10-CM

## 2018-09-02 ENCOUNTER — Ambulatory Visit (INDEPENDENT_AMBULATORY_CARE_PROVIDER_SITE_OTHER): Payer: PRIVATE HEALTH INSURANCE | Admitting: Otolaryngology

## 2018-09-02 DIAGNOSIS — H9209 Otalgia, unspecified ear: Secondary | ICD-10-CM

## 2018-09-02 DIAGNOSIS — H9313 Tinnitus, bilateral: Secondary | ICD-10-CM

## 2018-09-02 DIAGNOSIS — H6121 Impacted cerumen, right ear: Secondary | ICD-10-CM

## 2018-09-24 ENCOUNTER — Telehealth: Payer: Self-pay | Admitting: Family Medicine

## 2018-09-24 DIAGNOSIS — Z1211 Encounter for screening for malignant neoplasm of colon: Secondary | ICD-10-CM

## 2018-09-24 NOTE — Telephone Encounter (Signed)
Go ahead and do a referral for colonoscopy for screening.

## 2018-09-24 NOTE — Telephone Encounter (Signed)
Referral placed patient aware  

## 2018-09-25 ENCOUNTER — Encounter: Payer: Self-pay | Admitting: Internal Medicine

## 2018-10-21 ENCOUNTER — Telehealth: Payer: Self-pay | Admitting: Family Medicine

## 2018-10-21 NOTE — Telephone Encounter (Signed)
Yes I am fine for you to go ahead and do this letter.

## 2018-10-21 NOTE — Telephone Encounter (Signed)
Spoke with patient and he stated that he had a positive test at [facility] in [place]. Advised that we would write letter and fax to requested number. Patient verbalized understanding.

## 2018-11-05 ENCOUNTER — Telehealth: Payer: Self-pay | Admitting: Family Medicine

## 2018-11-05 DIAGNOSIS — I10 Essential (primary) hypertension: Secondary | ICD-10-CM

## 2018-11-05 DIAGNOSIS — E782 Mixed hyperlipidemia: Secondary | ICD-10-CM

## 2018-11-05 MED ORDER — LISINOPRIL-HYDROCHLOROTHIAZIDE 20-25 MG PO TABS: 1.0000 | ORAL_TABLET | Freq: Every day | ORAL | 0 refills | Status: DC

## 2018-11-05 MED ORDER — LOVASTATIN 20 MG PO TABS: 20.0000 mg | ORAL_TABLET | Freq: Every morning | ORAL | 0 refills | Status: DC

## 2018-11-05 NOTE — Telephone Encounter (Signed)
Prescription sent to pharmacy. Needs follow up appt.

## 2018-11-05 NOTE — Telephone Encounter (Signed)
Last chronic check up was 03/29/18.  Please advise

## 2018-11-05 NOTE — Telephone Encounter (Signed)
What is the name of the medication? Lisinopril 25 mg, Lovstatin 20 mg 3 month supply  Have you contacted your pharmacy to request a refill? yes  Which pharmacy would you like this sent to? [facility] in [place]   Patient notified that their request is being sent to the clinical staff for review and that they should receive a call once it is complete. If they do not receive a call within 24 hours they can check with their pharmacy or our office.

## 2018-11-06 ENCOUNTER — Other Ambulatory Visit: Payer: Self-pay

## 2018-11-07 ENCOUNTER — Ambulatory Visit (INDEPENDENT_AMBULATORY_CARE_PROVIDER_SITE_OTHER): Payer: PRIVATE HEALTH INSURANCE | Admitting: *Deleted

## 2018-11-07 DIAGNOSIS — Z23 Encounter for immunization: Secondary | ICD-10-CM | POA: Diagnosis not present

## 2018-11-12 NOTE — Progress Notes (Signed)
Referring Provider: Dettinger, Micheal Kaufmann, MD Primary Care Physician:  Micheal Ross, Micheal Kaufmann, MD Primary Gastroenterologist:  Dr. Gala Romney  Chief Complaint  Patient presents with  . Colonoscopy    last tcs approx 13 yrs ago in [place], [place]    HPI:   Micheal Popke. is a 58 y.o. male presenting today at the request of Micheal Ross, Micheal Kaufmann, MD for screening colonoscopy. Patient was brought in office due to BMI. Past medical history significant for HTN, HLD, and meningioma s/p resection in 2014.   Today he states he has no concerns at this time. Denies abdominal pain, constipation, diarrhea, bright red blood per rectum, melena, heartburn, acid reflux, indigestion, dysphasia, nausea, and vomiting.  No unintentional weight loss.  Bowel movements are daily.  Last colonoscopy about 13 years ago in [place], [place].  Reports 1-3 polyps.  States "Everything turned out good." Doesn't know how frequently he was supposed to have surveillance.   No fever, chills, cold or flu like symptoms, lightheadedness or dizziness. No chest pain, palpitations, or shortness of breath at rest. Admits to SOB with exertion at times. Denies cough or wheezing. No sleep apnea that he is aware of. He does snore at times.   Alcohol maybe once of month. No drug use.   No family history of colon cancer or colon polyps.   Past Medical History:  Diagnosis Date  . Brain tumor ([employer])    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)  . Hypercholesterolemia   . Hypertension     Past Surgical History:  Procedure Laterality Date  . ANKLE SURGERY    . brain tumor removal  2014  . HERNIA REPAIR  [id number]   umbilical    Current Outpatient Medications  Medication Sig Dispense Refill  . aspirin EC 81 MG tablet Take 81 mg by mouth every other day.    . lisinopril-hydrochlorothiazide (ZESTORETIC) 20-25 MG tablet Take 1 tablet by mouth daily. 90 tablet 0  . lovastatin (MEVACOR) 20 MG tablet Take 1 tablet (20 mg total) by mouth every morning.  90 tablet 0  . Multiple Vitamins-Minerals (ONE-A-DAY MENS 50+ ADVANTAGE) TABS Take 1 tablet by mouth daily.    . polyethylene glycol-electrolytes (NULYTELY/GOLYTELY) 420 g solution Take 4,000 mLs by mouth once for 1 dose. 4000 mL 0   No current facility-administered medications for this visit.     Allergies as of 11/14/2018  . (No Known Allergies)    Family History  Problem Relation Age of Onset  . Thyroid disease Mother   . COPD Father   . Cancer Maternal Aunt   . Cancer Maternal Uncle        back  . Cancer Paternal Uncle        throat   . Cancer Cousin        prostate  . Colon cancer Neg Hx   . Colon polyps Neg Hx     Social History   Socioeconomic History  . Marital status: Married    Spouse name: Not on file  . Number of children: Not on file  . Years of education: Not on file  . Highest education level: Not on file  Occupational History  . Not on file  Social Needs  . Financial resource strain: Not on file  . Food insecurity    Worry: Not on file    Inability: Not on file  . Transportation needs    Medical: Not on file    Non-medical: Not on file  Tobacco Use  .  Smoking status: Former Smoker    Packs/day: 0.50    Years: 2.00    Pack years: 1.00    Quit date: 03/30/1983    Years since quitting: 35.6  . Smokeless tobacco: Never Used  Substance and Sexual Activity  . Alcohol use: Yes    Comment: occ  . Drug use: No  . Sexual activity: Not on file    Comment: married for 1985, 6 grown kids  Lifestyle  . Physical activity    Days per week: Not on file    Minutes per session: Not on file  . Stress: Not on file  Relationships  . Social Herbalist on phone: Not on file    Gets together: Not on file    Attends religious service: Not on file    Active member of club or organization: Not on file    Attends meetings of clubs or organizations: Not on file    Relationship status: Not on file  . Intimate partner violence    Fear of current or ex  partner: Not on file    Emotionally abused: Not on file    Physically abused: Not on file    Forced sexual activity: Not on file  Other Topics Concern  . Not on file  Social History Narrative  . Not on file    Review of Systems: Gen: See HPI CV: See HPI  Resp: See HPI GI: See HPI GU : Denies urinary burning, urinary frequency, urinary hesitancy  MS: Denies joint pain, muscle weakness Derm: Denies rash, itching, dry skin Psych: Denies depression, anxiety Heme: Denies bruising, bleeding  Physical Exam: BP (!) 152/92   Pulse 96   Temp (!) 97.3 F (36.3 C) (Temporal)   Ht 5\' 10"  (1.778 m)   Wt (!) 326 lb 3.2 oz (148 kg)   BMI 46.80 kg/m  General:   Alert and oriented. Pleasant and cooperative. Morbid obesity.  Head:  Normocephalic and atraumatic. Eyes:  Without icterus, sclera clear and conjunctiva pink.  Ears:  Normal auditory acuity. Nose:  No deformity, discharge,  or lesions. Lungs:  Clear to auscultation bilaterally. No wheezes, rales, or rhonchi. No distress.  Heart:  S1, S2 present without murmurs appreciated.  Abdomen: Large abdomen. +BS, soft, non-tender. No HSM noted. No guarding or rebound. No masses appreciated.  Rectal:  Deferred to time of colonoscopy Msk:  Symmetrical without gross deformities. Normal posture. Extremities:  Without edema. Neurologic:  Alert and  oriented x4;  grossly normal neurologically. Skin:  Intact without significant lesions or rashes. Psych: Normal mood and affect.

## 2018-11-14 ENCOUNTER — Other Ambulatory Visit: Payer: Self-pay | Admitting: *Deleted

## 2018-11-14 ENCOUNTER — Telehealth: Payer: Self-pay | Admitting: *Deleted

## 2018-11-14 ENCOUNTER — Other Ambulatory Visit: Payer: Self-pay

## 2018-11-14 ENCOUNTER — Encounter: Payer: Self-pay | Admitting: Gastroenterology

## 2018-11-14 ENCOUNTER — Encounter: Payer: Self-pay | Admitting: *Deleted

## 2018-11-14 ENCOUNTER — Ambulatory Visit (INDEPENDENT_AMBULATORY_CARE_PROVIDER_SITE_OTHER): Payer: PRIVATE HEALTH INSURANCE | Admitting: Gastroenterology

## 2018-11-14 DIAGNOSIS — Z86010 Personal history of colonic polyps: Secondary | ICD-10-CM

## 2018-11-14 MED ORDER — PEG 3350-KCL-NA BICARB-NACL 420 G PO SOLR: 4000.0000 mL | Freq: Once | ORAL | 0 refills | Status: AC

## 2018-11-14 NOTE — Assessment & Plan Note (Addendum)
57 y.o. male with past medical history significant for HTN, HLD, meningioma, and colon polyps who presents to schedule his surveillance colonoscopy. He is overdue at this time. Last TCS 13 years ago in [place], [place].  Patient reports he had 1-3 polyps.  Does not remember the polyp type but states, "Everything turned out good."  He is not sure how frequently he was supposed to have surveillance.  He is without any upper or lower GI symptoms at this time.  No alarm symptoms.  No known sleep apnea but does report snoring.  No family history of colon cancer or colon polyps.  We will proceed with surveillance colonoscopy with propofol with Dr. Gala Romney in the near future.  Propofol due to BMI. The risks, benefits, and alternatives have been discussed in detail with patient. They have stated understanding and desire to proceed.  Follow-up after procedure.

## 2018-11-14 NOTE — Telephone Encounter (Signed)
No PA is required through [facility] for TCS since we are in network.

## 2018-11-14 NOTE — Patient Instructions (Signed)
We will get you scheduled for colonoscopy with Dr. Gala Romney in the near future.   We will see you back after your procedure.   Aliene Altes, PA-C [facility]

## 2018-11-15 ENCOUNTER — Encounter: Payer: Self-pay | Admitting: Family Medicine

## 2018-11-15 ENCOUNTER — Ambulatory Visit (INDEPENDENT_AMBULATORY_CARE_PROVIDER_SITE_OTHER): Payer: PRIVATE HEALTH INSURANCE | Admitting: Family Medicine

## 2018-11-15 VITALS — BP 130/87 | HR 105 | Temp 97.5°F | Ht 70.0 in | Wt 325.4 lb

## 2018-11-15 DIAGNOSIS — I10 Essential (primary) hypertension: Secondary | ICD-10-CM | POA: Diagnosis not present

## 2018-11-15 NOTE — Progress Notes (Signed)
BP 130/87   Pulse (!) 105   Temp (!) 97.5 F (36.4 C) (Temporal)   Ht 5\' 10"  (1.778 m)   Wt (!) 325 lb 6.4 oz (147.6 kg)   BMI 46.69 kg/m    Subjective:   Patient ID: Micheal Sabal., male    DOB: 1960-04-23, 58 y.o.   MRN: [id number]  HPI: Micheal Beyers. is a 58 y.o. male presenting on 11/15/2018 for Hypertension (150/92- yesterday )   HPI Hypertension Patient is currently on hydrochlorothiazide, and their blood pressure today is 130/87, he was concerned because yesterday was another doctor's office and it was 150/90 but it has looked good at our office.  He denies any headache or blurred vision. Patient denies any lightheadedness or dizziness. Patient denies headaches, blurred vision, chest pains, shortness of breath, or weakness. Denies any side effects from medication and is content with current medication.    Relevant past medical, surgical, family and social history reviewed and updated as indicated. Interim medical history since our last visit reviewed. Allergies and medications reviewed and updated.  Review of Systems  Constitutional: Negative for chills and fever.  Respiratory: Negative for shortness of breath and wheezing.   Cardiovascular: Negative for chest pain, palpitations and leg swelling.  Musculoskeletal: Negative for back pain and gait problem.  Skin: Negative for rash.  Neurological: Negative for dizziness, weakness and light-headedness.  All other systems reviewed and are negative.   Per HPI unless specifically indicated above   Allergies as of 11/15/2018   No Known Allergies     Medication List       Accurate as of November 15, 2018  2:52 PM. If you have any questions, ask your nurse or doctor.        aspirin EC 81 MG tablet Take 81 mg by mouth every other day.   lisinopril-hydrochlorothiazide 20-25 MG tablet Commonly known as: ZESTORETIC Take 1 tablet by mouth daily.   lovastatin 20 MG tablet Commonly known as: MEVACOR Take 1  tablet (20 mg total) by mouth every morning.   One-A-Day Mens 50+ Advantage Tabs Take 1 tablet by mouth daily.        Objective:   BP 130/87   Pulse (!) 105   Temp (!) 97.5 F (36.4 C) (Temporal)   Ht 5\' 10"  (1.778 m)   Wt (!) 325 lb 6.4 oz (147.6 kg)   BMI 46.69 kg/m   Wt Readings from Last 3 Encounters:  11/15/18 (!) 325 lb 6.4 oz (147.6 kg)  11/14/18 (!) 326 lb 3.2 oz (148 kg)  03/29/18 (!) 321 lb 9.6 oz (145.9 kg)    Physical Exam Vitals signs and nursing note reviewed.  Constitutional:      General: He is not in acute distress.    Appearance: He is well-developed. He is not diaphoretic.  Eyes:     General: No scleral icterus.    Conjunctiva/sclera: Conjunctivae normal.  Neck:     Musculoskeletal: Neck supple.     Thyroid: No thyromegaly.  Cardiovascular:     Rate and Rhythm: Normal rate and regular rhythm.     Heart sounds: Normal heart sounds. No murmur.  Pulmonary:     Effort: Pulmonary effort is normal. No respiratory distress.     Breath sounds: Normal breath sounds. No wheezing.  Musculoskeletal: Normal range of motion.  Lymphadenopathy:     Cervical: No cervical adenopathy.  Skin:    General: Skin is warm and dry.     Findings: No  rash.  Neurological:     Mental Status: He is alert and oriented to person, place, and time.     Coordination: Coordination normal.  Psychiatric:        Behavior: Behavior normal.       Assessment & Plan:   Problem List Items Addressed This Visit      Cardiovascular and Mediastinum   Hypertension - Primary      No change in blood pressure medication, he Artie has an appointment to come back next year some time, keep that appointment. Follow up plan: Return in about 6 months (around 05/15/2019), or if symptoms worsen or fail to improve, for Hypertension and cholesterol.  Counseling provided for all of the vaccine components No orders of the defined types were placed in this encounter.   Caryl Pina, MD  [facility] 11/15/2018, 2:52 PM

## 2018-12-18 ENCOUNTER — Other Ambulatory Visit: Payer: Self-pay

## 2018-12-19 ENCOUNTER — Ambulatory Visit (INDEPENDENT_AMBULATORY_CARE_PROVIDER_SITE_OTHER): Payer: PRIVATE HEALTH INSURANCE | Admitting: Family Medicine

## 2018-12-19 ENCOUNTER — Encounter: Payer: Self-pay | Admitting: Family Medicine

## 2018-12-19 ENCOUNTER — Other Ambulatory Visit: Payer: Self-pay

## 2018-12-19 VITALS — BP 130/89 | HR 100 | Temp 98.6°F | Ht 70.0 in | Wt 327.0 lb

## 2018-12-19 DIAGNOSIS — I10 Essential (primary) hypertension: Secondary | ICD-10-CM | POA: Diagnosis not present

## 2018-12-19 DIAGNOSIS — E782 Mixed hyperlipidemia: Secondary | ICD-10-CM

## 2018-12-19 DIAGNOSIS — H359 Unspecified retinal disorder: Secondary | ICD-10-CM

## 2018-12-19 MED ORDER — LOVASTATIN 20 MG PO TABS: 20.0000 mg | ORAL_TABLET | Freq: Every morning | ORAL | 3 refills | Status: DC

## 2018-12-19 MED ORDER — LISINOPRIL-HYDROCHLOROTHIAZIDE 20-25 MG PO TABS: 1.0000 | ORAL_TABLET | Freq: Every day | ORAL | 3 refills | Status: DC

## 2018-12-19 NOTE — Progress Notes (Signed)
BP 130/89   Pulse 100   Temp 98.6 F (37 C) (Temporal)   Ht 5' 10"  (1.778 m)   Wt (!) 327 lb (148.3 kg)   SpO2 96%   BMI 46.92 kg/m    Subjective:   Patient ID: Micheal Ross., male    DOB: 31-Mar-1960, 58 y.o.   MRN: [id number]  HPI: Micheal Ross. is a 58 y.o. male presenting on 12/19/2018 for Medical Management of Chronic Issues (Went to eye doctor and they said that he should get his cholesterol checked due to his eye pressure)   HPI Hypertension Patient is currently on lisinopril hydrochlorothiazide, and their blood pressure today is 130/89. Patient denies any lightheadedness or dizziness. Patient denies headaches, blurred vision, chest pains, shortness of breath, or weakness. Denies any side effects from medication and is content with current medication.   Hyperlipidemia Patient is coming in for recheck of his hyperlipidemia. The patient is currently taking lovastatin. They deny any issues with myalgias or history of liver damage from it. They deny any focal numbness or weakness or chest pain.   Patient said that his optometrist noticed retinal changes that he was concerned about cholesterol and probably his carotids and wanted to have these done.  Will order carotid ultrasound and check his cholesterol again.  Relevant past medical, surgical, family and social history reviewed and updated as indicated. Interim medical history since our last visit reviewed. Allergies and medications reviewed and updated.  Review of Systems  Constitutional: Negative for chills and fever.  Eyes: Negative for visual disturbance.  Respiratory: Negative for shortness of breath and wheezing.   Cardiovascular: Negative for chest pain and leg swelling.  Musculoskeletal: Negative for back pain and gait problem.  Skin: Negative for rash.  Neurological: Negative for dizziness, weakness and light-headedness.  All other systems reviewed and are negative.   Per HPI unless specifically  indicated above   Allergies as of 12/19/2018   No Known Allergies     Medication List       Accurate as of December 19, 2018  4:31 PM. If you have any questions, ask your nurse or doctor.        aspirin EC 81 MG tablet Take 81 mg by mouth every other day.   lisinopril-hydrochlorothiazide 20-25 MG tablet Commonly known as: ZESTORETIC Take 1 tablet by mouth daily.   lovastatin 20 MG tablet Commonly known as: MEVACOR Take 1 tablet (20 mg total) by mouth every morning.   One-A-Day Mens 50+ Advantage Tabs Take 1 tablet by mouth daily.        Objective:   BP 130/89   Pulse 100   Temp 98.6 F (37 C) (Temporal)   Ht 5' 10"  (1.778 m)   Wt (!) 327 lb (148.3 kg)   SpO2 96%   BMI 46.92 kg/m   Wt Readings from Last 3 Encounters:  12/19/18 (!) 327 lb (148.3 kg)  11/15/18 (!) 325 lb 6.4 oz (147.6 kg)  11/14/18 (!) 326 lb 3.2 oz (148 kg)    Physical Exam Vitals signs and nursing note reviewed.  Constitutional:      General: He is not in acute distress.    Appearance: He is well-developed. He is not diaphoretic.  Eyes:     General: No scleral icterus.    Conjunctiva/sclera: Conjunctivae normal.  Neck:     Musculoskeletal: Neck supple.     Thyroid: No thyromegaly.  Cardiovascular:     Rate and Rhythm: Normal rate and regular  rhythm.     Heart sounds: Normal heart sounds. No murmur.  Pulmonary:     Effort: Pulmonary effort is normal. No respiratory distress.     Breath sounds: Normal breath sounds. No wheezing.  Musculoskeletal: Normal range of motion.  Lymphadenopathy:     Cervical: No cervical adenopathy.  Skin:    General: Skin is warm and dry.     Findings: No rash.  Neurological:     Mental Status: He is alert and oriented to person, place, and time.     Coordination: Coordination normal.  Psychiatric:        Behavior: Behavior normal.       Assessment & Plan:   Problem List Items Addressed This Visit      Cardiovascular and Mediastinum    Hypertension - Primary   Relevant Medications   lisinopril-hydrochlorothiazide (ZESTORETIC) 20-25 MG tablet   lovastatin (MEVACOR) 20 MG tablet   Other Relevant Orders   CMP14+EGFR (Completed)     Other   Hyperlipidemia   Relevant Medications   lisinopril-hydrochlorothiazide (ZESTORETIC) 20-25 MG tablet   lovastatin (MEVACOR) 20 MG tablet   Other Relevant Orders   US Carotid Duplex Bilateral (Completed)   Lipid panel (Completed)    Other Visit Diagnoses    Retinal change       Relevant Orders   US Carotid Duplex Bilateral (Completed)   Lipid panel (Completed)      Ultrasound carotid because of ophthalmology recommendation.  Blood pressure looks great and will check cholesterol, continue current medication Follow up plan: Return in about 6 months (around 06/19/2019), or if symptoms worsen or fail to improve, for Hyperlipidemia and hypertension.  Counseling provided for all of the vaccine components No orders of the defined types were placed in this encounter.   Caryl Pina, MD [facility] 12/19/2018, 4:31 PM

## 2018-12-20 LAB — CMP14+EGFR
ALT: 32 IU/L (ref 0–44)
AST: 26 IU/L (ref 0–40)
Albumin/Globulin Ratio: 1.7 (ref 1.2–2.2)
Albumin: 4.7 g/dL (ref 3.8–4.9)
Alkaline Phosphatase: 71 IU/L (ref 39–117)
BUN/Creatinine Ratio: 12 (ref 9–20)
BUN: 14 mg/dL (ref 6–24)
Bilirubin Total: <0.2 mg/dL (ref 0.0–1.2)
CO2: 21 mmol/L (ref 20–29)
Calcium: 9.8 mg/dL (ref 8.7–10.2)
Chloride: 101 mmol/L (ref 96–106)
Creatinine, Ser: 1.17 mg/dL (ref 0.76–1.27)
GFR calc Af Amer: 78 mL/min/{1.73_m2} (ref >59)
GFR calc non Af Amer: 68 mL/min/{1.73_m2} (ref >59)
Globulin, Total: 2.8 g/dL (ref 1.5–4.5)
Glucose: 93 mg/dL (ref 65–99)
Potassium: 4.2 mmol/L (ref 3.5–5.2)
Sodium: 143 mmol/L (ref 134–144)
Total Protein: 7.5 g/dL (ref 6.0–8.5)

## 2018-12-20 LAB — LIPID PANEL
Chol/HDL Ratio: 4.8 ratio (ref 0.0–5.0)
Cholesterol, Total: 233 mg/dL — ABNORMAL HIGH (ref 100–199)
HDL: 49 mg/dL (ref >39)
LDL Chol Calc (NIH): 139 mg/dL — ABNORMAL HIGH (ref 0–99)
Triglycerides: 248 mg/dL — ABNORMAL HIGH (ref 0–149)
VLDL Cholesterol Cal: 45 mg/dL — ABNORMAL HIGH (ref 5–40)

## 2018-12-24 ENCOUNTER — Ambulatory Visit (HOSPITAL_COMMUNITY)
Admission: RE | Admit: 2018-12-24 | Discharge: 2018-12-24 | Disposition: A | Discharge status: Home / Self Care | Payer: PRIVATE HEALTH INSURANCE | Source: Ambulatory Visit | Attending: Family Medicine | Admitting: Family Medicine

## 2018-12-24 ENCOUNTER — Telehealth: Payer: Self-pay | Admitting: Family Medicine

## 2018-12-24 ENCOUNTER — Other Ambulatory Visit: Payer: Self-pay

## 2018-12-24 DIAGNOSIS — H359 Unspecified retinal disorder: Secondary | ICD-10-CM | POA: Insufficient documentation

## 2018-12-24 DIAGNOSIS — E782 Mixed hyperlipidemia: Secondary | ICD-10-CM | POA: Insufficient documentation

## 2018-12-24 IMAGING — US US CAROTID DUPLEX BILAT
1 series · 13 of 24 positions shown · non-contrast
Comparison: None.

CLINICAL DATA: Retinal changes by ophthalmic exam, hypertension,
hyperlipidemia, smoker

EXAM:
BILATERAL CAROTID DUPLEX ULTRASOUND
TECHNIQUE: Gray scale imaging, color Doppler and duplex ultrasound were
performed of bilateral carotid and vertebral arteries in the neck.

[Series 1: us carotid duplex bilat · 13 of 71 slices shown]
[im 1/71]
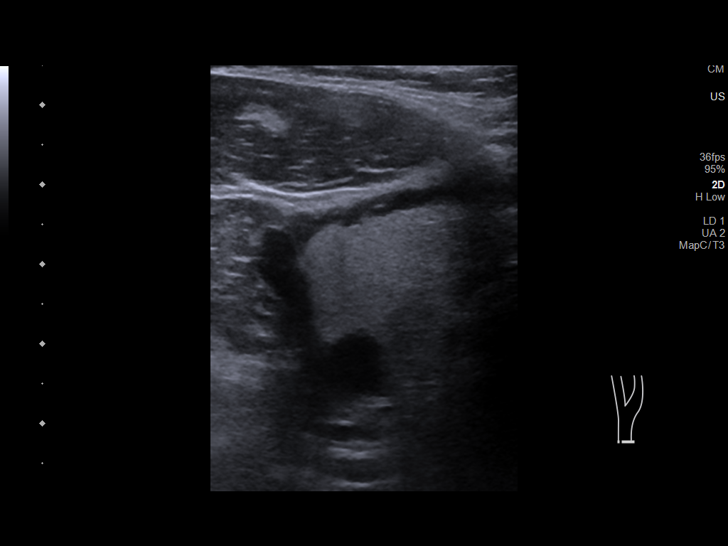
[im 7/71]
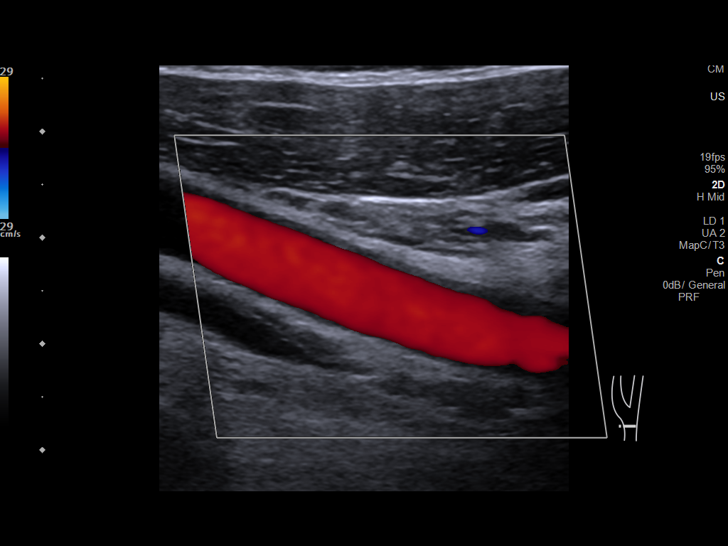
[im 13/71]
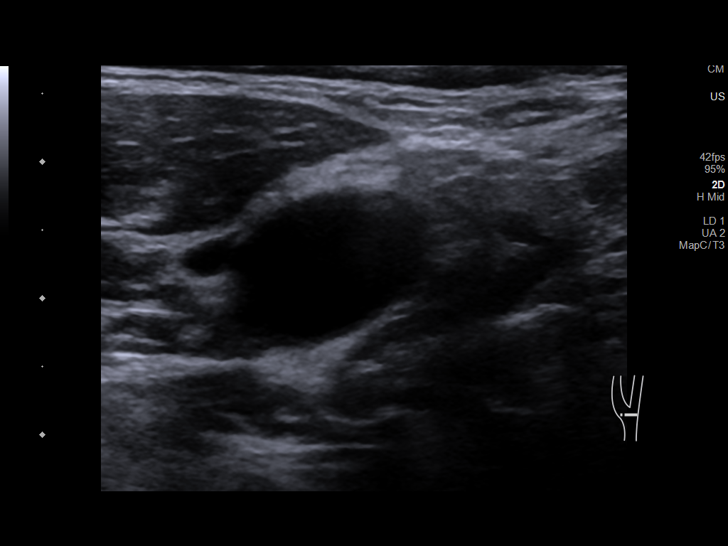
[im 19/71]
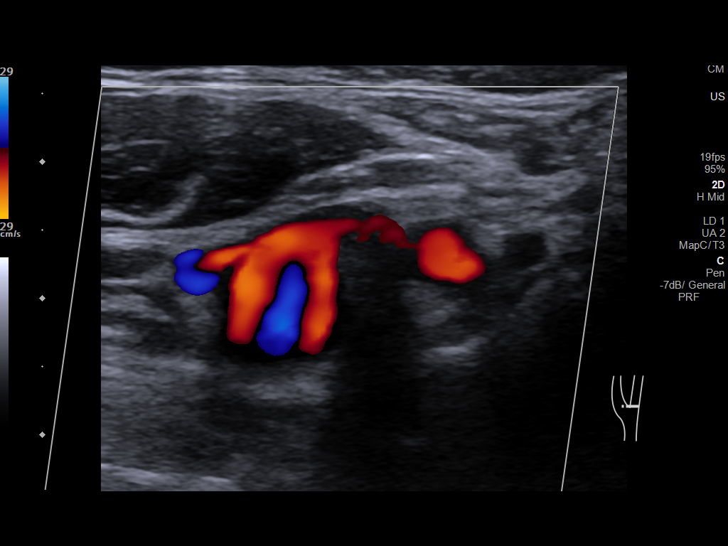
[im 25/71]
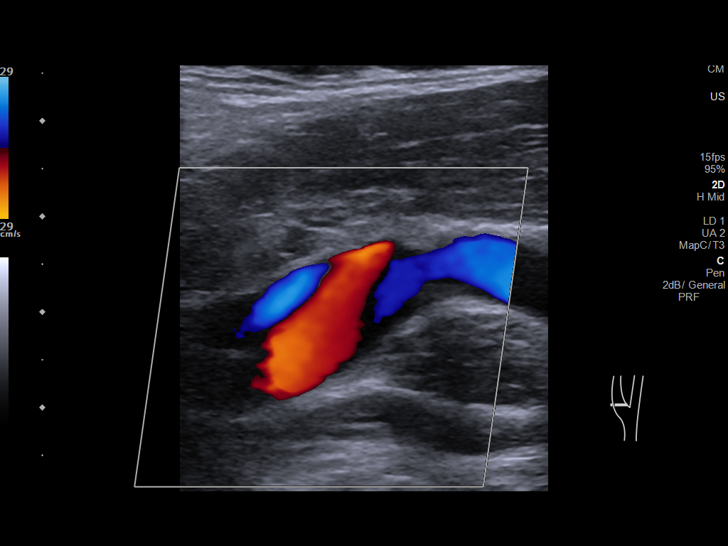
[im 31/71]
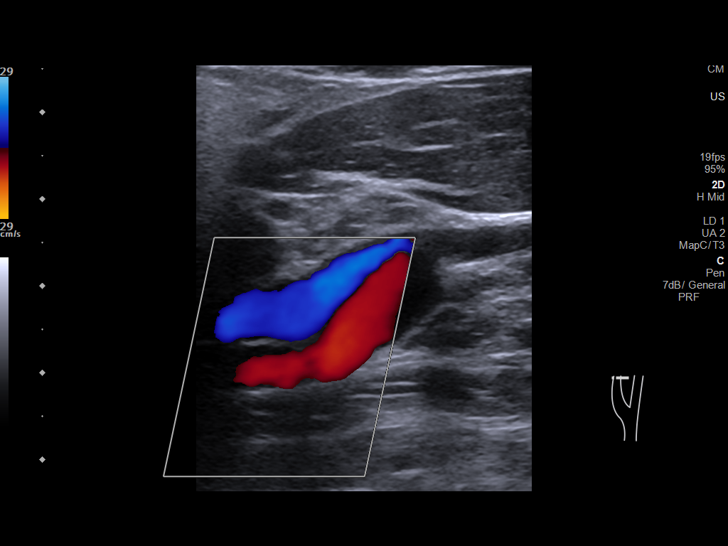
[im 37/71]
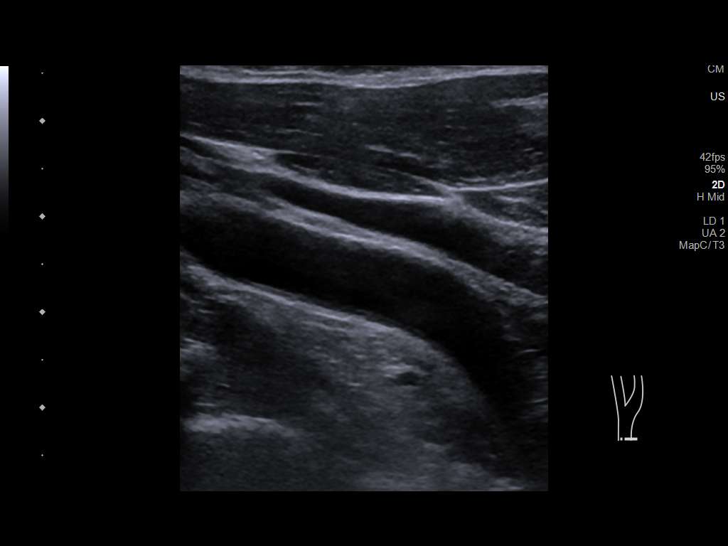
[im 40/71]
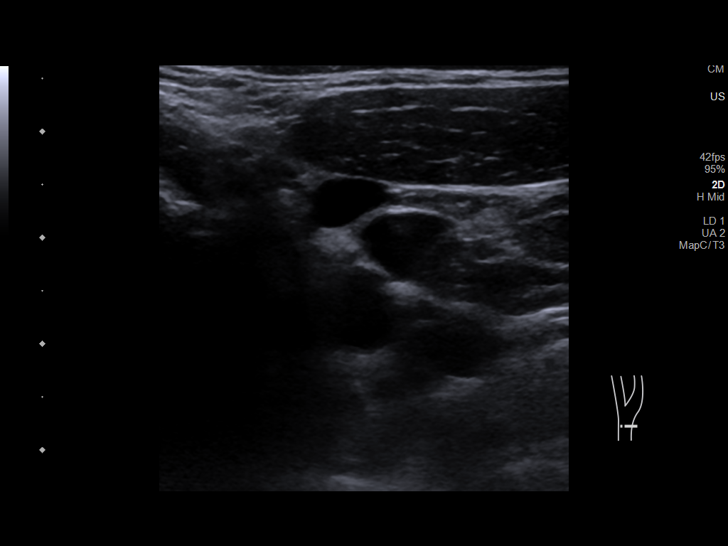
[im 46/71]
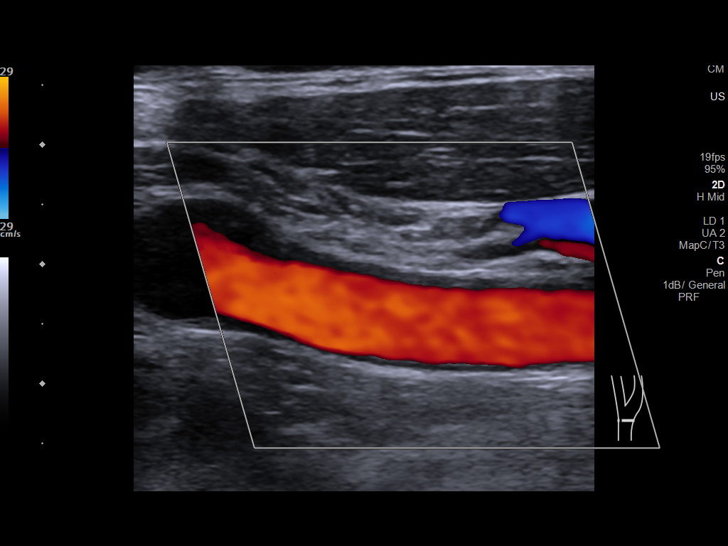
[im 52/71]
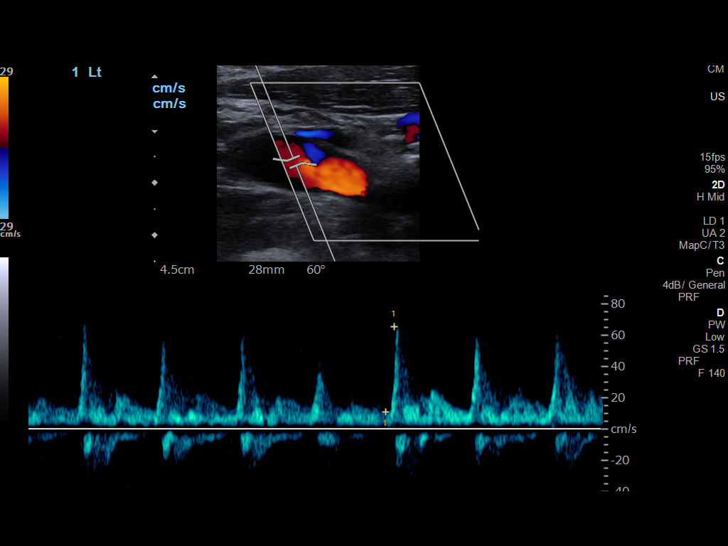
[im 58/71]
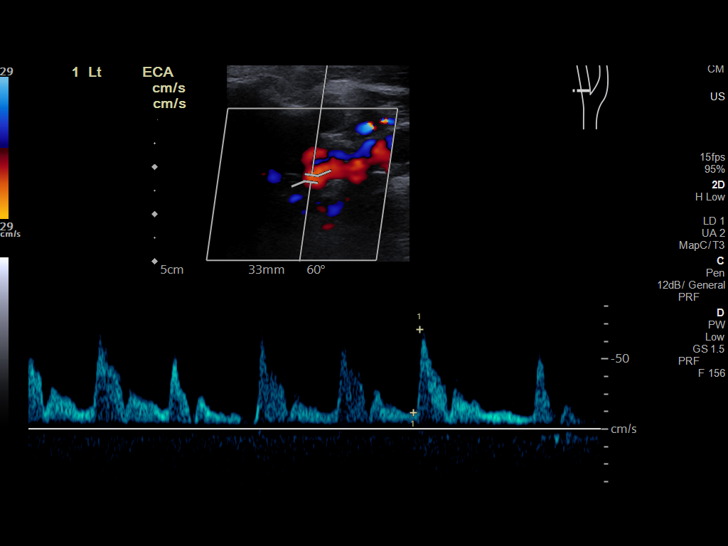
[im 64/71]
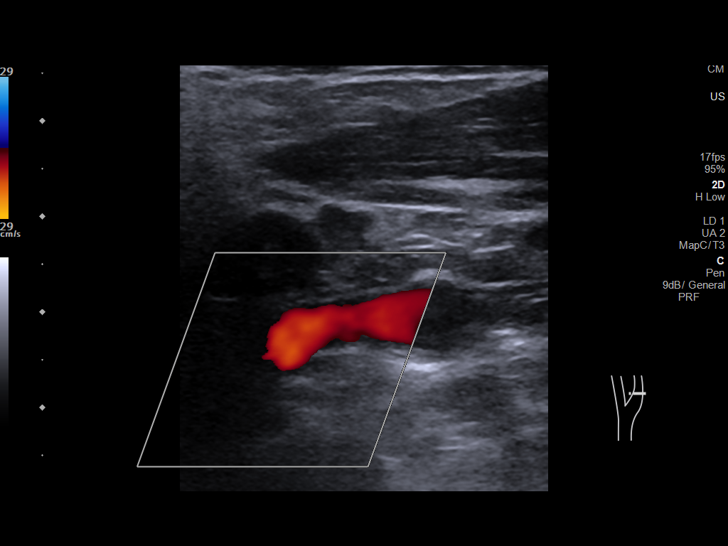
[im 71/71]
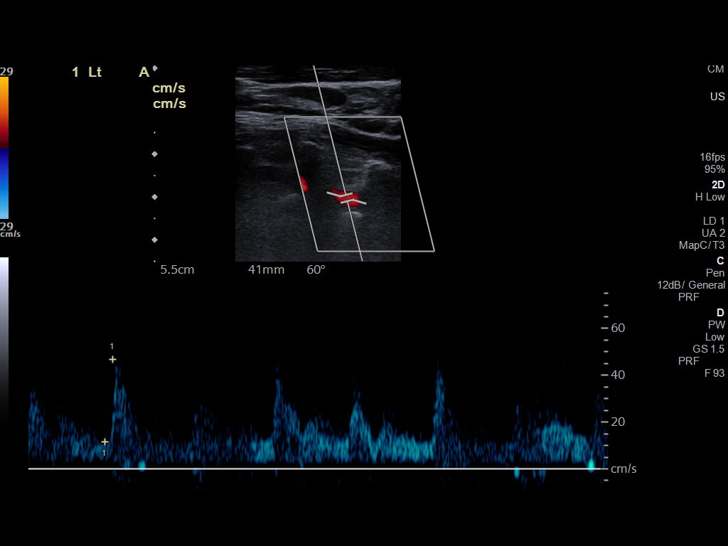

[13 of 24 positions shown; findings below may reference images not displayed]

FINDINGS: Criteria: Quantification of carotid stenosis is based on velocity
parameters that correlate the residual internal carotid diameter
with NASCET-based stenosis levels, using the diameter of the distal
internal carotid lumen as the denominator for stenosis measurement.

The following velocity measurements were obtained:

RIGHT

ICA: 86/15 cm/sec

CCA: 84/21 cm/sec

SYSTOLIC ICA/CCA RATIO:

ECA: 112 cm/sec

LEFT

ICA: 46/13 cm/sec

CCA: 87/12 cm/sec

SYSTOLIC ICA/CCA RATIO:

ECA: 71 cm/sec

RIGHT CAROTID ARTERY: Minor echogenic shadowing plaque formation. No
hemodynamically significant right ICA stenosis, velocity elevation,
or turbulent flow. Degree of narrowing less than 50%.

RIGHT VERTEBRAL ARTERY:  Antegrade

LEFT CAROTID ARTERY: Similar scattered minor echogenic plaque
formation. No hemodynamically significant left ICA stenosis,
velocity elevation, or turbulent flow.

LEFT VERTEBRAL ARTERY:  Antegrade
IMPRESSION: Minor carotid atherosclerosis. No hemodynamically significant ICA
stenosis by ultrasound. Degree of narrowing estimated less than 50%
bilaterally.

Patent antegrade vertebral flow bilaterally

## 2018-12-24 MED ORDER — BACLOFEN 10 MG PO TABS: 10.0000 mg | ORAL_TABLET | Freq: Three times a day (TID) | ORAL | 0 refills | Status: DC

## 2018-12-24 NOTE — Telephone Encounter (Signed)
Baclofen Prescription sent to pharmacy  ? ?

## 2018-12-24 NOTE — Telephone Encounter (Signed)
Aware of results. 

## 2018-12-24 NOTE — Telephone Encounter (Signed)
Patient states his back went this morning he has been here twice recently asking if he can get a muscle relaxer called intp [facility] in [place] with out him coming in.

## 2018-12-24 NOTE — Telephone Encounter (Signed)
Patient aware and verbalized understanding. °

## 2019-01-09 ENCOUNTER — Encounter (HOSPITAL_COMMUNITY): Payer: Self-pay

## 2019-01-09 ENCOUNTER — Other Ambulatory Visit: Payer: Self-pay

## 2019-01-09 ENCOUNTER — Telehealth: Payer: Self-pay | Admitting: Internal Medicine

## 2019-01-09 NOTE — Telephone Encounter (Signed)
Patient wanted how much he will have to pay for his procedure on Monday. I provided him with pre service center #

## 2019-01-09 NOTE — Telephone Encounter (Signed)
Pt said he is scheduled procedure with [facility] on Monday and he has questions about his Insurance. He called [facility] and was told to call us. I asked him if he had called his insurance and he said no. [phone]

## 2019-01-10 ENCOUNTER — Encounter (HOSPITAL_COMMUNITY)
Admission: RE | Admit: 2019-01-10 | Discharge: 2019-01-10 | Disposition: A | Discharge status: Home / Self Care | Payer: PRIVATE HEALTH INSURANCE | Source: Ambulatory Visit | Attending: Internal Medicine | Admitting: Internal Medicine

## 2019-01-10 ENCOUNTER — Other Ambulatory Visit (HOSPITAL_COMMUNITY)
Admission: RE | Admit: 2019-01-10 | Discharge: 2019-01-10 | Disposition: A | Discharge status: Home / Self Care | Payer: PRIVATE HEALTH INSURANCE | Source: Ambulatory Visit | Attending: Internal Medicine | Admitting: Internal Medicine

## 2019-01-10 DIAGNOSIS — Z20828 Contact with and (suspected) exposure to other viral communicable diseases: Secondary | ICD-10-CM | POA: Insufficient documentation

## 2019-01-10 DIAGNOSIS — Z01812 Encounter for preprocedural laboratory examination: Secondary | ICD-10-CM | POA: Diagnosis present

## 2019-01-10 LAB — SARS CORONAVIRUS 2 (TAT 6-24 HRS): SARS Coronavirus 2: NEGATIVE

## 2019-01-13 ENCOUNTER — Ambulatory Visit (HOSPITAL_COMMUNITY)
Admission: RE | Admit: 2019-01-13 | Discharge: 2019-01-13 | Disposition: A | Discharge status: Home / Self Care | Payer: PRIVATE HEALTH INSURANCE | Attending: Internal Medicine | Admitting: Internal Medicine

## 2019-01-13 ENCOUNTER — Encounter (HOSPITAL_COMMUNITY)
Admission: RE | Disposition: A | Discharge status: Home / Self Care | Payer: Self-pay | Source: Home / Self Care | Attending: Internal Medicine

## 2019-01-13 ENCOUNTER — Ambulatory Visit (HOSPITAL_COMMUNITY): Payer: PRIVATE HEALTH INSURANCE | Admitting: Anesthesiology

## 2019-01-13 ENCOUNTER — Encounter (HOSPITAL_COMMUNITY): Payer: Self-pay | Admitting: *Deleted

## 2019-01-13 DIAGNOSIS — Z87891 Personal history of nicotine dependence: Secondary | ICD-10-CM | POA: Diagnosis not present

## 2019-01-13 DIAGNOSIS — I10 Essential (primary) hypertension: Secondary | ICD-10-CM | POA: Diagnosis not present

## 2019-01-13 DIAGNOSIS — Z7982 Long term (current) use of aspirin: Secondary | ICD-10-CM | POA: Insufficient documentation

## 2019-01-13 DIAGNOSIS — Z86010 Personal history of colonic polyps: Secondary | ICD-10-CM

## 2019-01-13 DIAGNOSIS — Q438 Other specified congenital malformations of intestine: Secondary | ICD-10-CM | POA: Insufficient documentation

## 2019-01-13 DIAGNOSIS — D120 Benign neoplasm of cecum: Secondary | ICD-10-CM | POA: Diagnosis not present

## 2019-01-13 DIAGNOSIS — Z79899 Other long term (current) drug therapy: Secondary | ICD-10-CM | POA: Insufficient documentation

## 2019-01-13 DIAGNOSIS — Z8719 Personal history of other diseases of the digestive system: Secondary | ICD-10-CM | POA: Diagnosis not present

## 2019-01-13 DIAGNOSIS — Z1211 Encounter for screening for malignant neoplasm of colon: Secondary | ICD-10-CM | POA: Diagnosis present

## 2019-01-13 DIAGNOSIS — E7800 Pure hypercholesterolemia, unspecified: Secondary | ICD-10-CM | POA: Diagnosis not present

## 2019-01-13 DIAGNOSIS — K635 Polyp of colon: Secondary | ICD-10-CM | POA: Diagnosis not present

## 2019-01-13 HISTORY — PX: COLONOSCOPY WITH PROPOFOL: SHX5780

## 2019-01-13 HISTORY — PX: POLYPECTOMY: SHX5525

## 2019-01-13 SURGERY — COLONOSCOPY WITH PROPOFOL
Anesthesia: General

## 2019-01-13 MED ORDER — KETAMINE HCL 50 MG/5ML IJ SOSY
PREFILLED_SYRINGE | INTRAMUSCULAR | Status: AC
  Filled 2019-01-13: qty 5

## 2019-01-13 MED ORDER — LACTATED RINGERS IV SOLN
INTRAVENOUS | Status: DC
  Administered 2019-01-13: 12:00:00 via INTRAVENOUS

## 2019-01-13 MED ORDER — PROPOFOL 10 MG/ML IV BOLUS
INTRAVENOUS | Status: DC | PRN
  Administered 2019-01-13 (×2): 20 mg via INTRAVENOUS

## 2019-01-13 MED ORDER — PROPOFOL 10 MG/ML IV BOLUS
INTRAVENOUS | Status: AC
  Filled 2019-01-13: qty 60

## 2019-01-13 MED ORDER — PROMETHAZINE HCL 25 MG/ML IJ SOLN: 6.2500 mg | INTRAMUSCULAR | Status: DC | PRN

## 2019-01-13 MED ORDER — HYDROCODONE-ACETAMINOPHEN 7.5-325 MG PO TABS: 1.0000 | ORAL_TABLET | Freq: Once | ORAL | Status: DC | PRN

## 2019-01-13 MED ORDER — PROPOFOL 500 MG/50ML IV EMUL
INTRAVENOUS | Status: DC | PRN
  Administered 2019-01-13: 100 ug/kg/min via INTRAVENOUS
  Administered 2019-01-13: 75 ug/kg/min via INTRAVENOUS
  Administered 2019-01-13: 100 ug/kg/min via INTRAVENOUS
  Administered 2019-01-13: 200 ug/kg/min via INTRAVENOUS
  Administered 2019-01-13: 75 ug/kg/min via INTRAVENOUS

## 2019-01-13 MED ORDER — MIDAZOLAM HCL 2 MG/2ML IJ SOLN: 0.5000 mg | Freq: Once | INTRAMUSCULAR | Status: DC | PRN

## 2019-01-13 MED ORDER — CHLORHEXIDINE GLUCONATE CLOTH 2 % EX PADS: 6.0000 | MEDICATED_PAD | Freq: Once | CUTANEOUS | Status: DC

## 2019-01-13 MED ORDER — KETAMINE HCL 10 MG/ML IJ SOLN
INTRAMUSCULAR | Status: DC | PRN
  Administered 2019-01-13: 15 mg via INTRAVENOUS
  Administered 2019-01-13 (×8): 5 mg via INTRAVENOUS

## 2019-01-13 MED ORDER — HYDROMORPHONE HCL 1 MG/ML IJ SOLN: 0.2500 mg | INTRAMUSCULAR | Status: DC | PRN

## 2019-01-13 NOTE — Anesthesia Procedure Notes (Signed)
Date/Time: 01/13/2019 12:32 PM Performed by: Vista Deck, CRNA Pre-anesthesia Checklist: Patient identified, Emergency Drugs available, Suction available, Timeout performed and Patient being monitored Patient Re-evaluated:Patient Re-evaluated prior to induction Oxygen Delivery Method: Nasal Cannula

## 2019-01-13 NOTE — Discharge Instructions (Signed)
Colonoscopy Discharge Instructions  Read the instructions outlined below and refer to this sheet in the next few weeks. These discharge instructions provide you with general information on caring for yourself after you leave the hospital. Your doctor may also give you specific instructions. While your treatment has been planned according to the most current medical practices available, unavoidable complications occasionally occur. If you have any problems or questions after discharge, call Dr. Gala Romney at [phone]. ACTIVITY  You may resume your regular activity, but move at a slower pace for the next 24 hours.   Take frequent rest periods for the next 24 hours.   Walking will help get rid of the air and reduce the bloated feeling in your belly (abdomen).   No driving for 24 hours (because of the medicine (anesthesia) used during the test).    Do not sign any important legal documents or operate any machinery for 24 hours (because of the anesthesia used during the test).  NUTRITION  Drink plenty of fluids.   You may resume your normal diet as instructed by your doctor.   Begin with a light meal and progress to your normal diet. Heavy or fried foods are harder to digest and may make you feel sick to your stomach (nauseated).   Avoid alcoholic beverages for 24 hours or as instructed.  MEDICATIONS  You may resume your normal medications unless your doctor tells you otherwise.  WHAT YOU CAN EXPECT TODAY  Some feelings of bloating in the abdomen.   Passage of more gas than usual.   Spotting of blood in your stool or on the toilet paper.  IF YOU HAD POLYPS REMOVED DURING THE COLONOSCOPY:  No aspirin products for 7 days or as instructed.   No alcohol for 7 days or as instructed.   Eat a soft diet for the next 24 hours.  FINDING OUT THE RESULTS OF YOUR TEST Not all test results are available during your visit. If your test results are not back during the visit, make an appointment  with your caregiver to find out the results. Do not assume everything is normal if you have not heard from your caregiver or the medical facility. It is important for you to follow up on all of your test results.  SEEK IMMEDIATE MEDICAL ATTENTION IF:  You have more than a spotting of blood in your stool.   Your belly is swollen (abdominal distention).   You are nauseated or vomiting.   You have a temperature over 101.   You have abdominal pain or discomfort that is severe or gets worse throughout the day.    You had a very large polyp in your cecum (upstream end of your colon).  It was difficult to remove.  It is possible some of this polyp remains.  At a minimum, you will need a repeat colonoscopy in 4 months to look at the site to make sure all of it was removed.  If the specimen submitted to the lab show any high-grade findings such as early cancer, you will need an operation to remove this part of your colon.  Absolutely do not take any dose of aspirin or NSAIDs like Advil or Aleve for the next 10 days  Tylenol products will be okay  Further recommendations to follow.  At patient request, I spoke to Laurie Panda, wife, at length about findings [phone]      Monitored Anesthesia Care, Care After These instructions provide you with information about caring for yourself after  your procedure. Your health care provider may also give you more specific instructions. Your treatment has been planned according to current medical practices, but problems sometimes occur. Call your health care provider if you have any problems or questions after your procedure. What can I expect after the procedure? After your procedure, you may:  Feel sleepy for several hours.  Feel clumsy and have poor balance for several hours.  Feel forgetful about what happened after the procedure.  Have poor judgment for several hours.  Feel nauseous or vomit.  Have a sore throat if you had a breathing  tube during the procedure. Follow these instructions at home: For at least 24 hours after the procedure:      Have a responsible adult stay with you. It is important to have someone help care for you until you are awake and alert.  Rest as needed.  Do not: ? Participate in activities in which you could fall or become injured. ? Drive. ? Use heavy machinery. ? Drink alcohol. ? Take sleeping pills or medicines that cause drowsiness. ? Make important decisions or sign legal documents. ? Take care of children on your own. Eating and drinking  Follow the diet that is recommended by your health care provider.  If you vomit, drink water, juice, or soup when you can drink without vomiting.  Make sure you have little or no nausea before eating solid foods. General instructions  Take over-the-counter and prescription medicines only as told by your health care provider.  If you have sleep apnea, surgery and certain medicines can increase your risk for breathing problems. Follow instructions from your health care provider about wearing your sleep device: ? Anytime you are sleeping, including during daytime naps. ? While taking prescription pain medicines, sleeping medicines, or medicines that make you drowsy.  If you smoke, do not smoke without supervision.  Keep all follow-up visits as told by your health care provider. This is important. Contact a health care provider if:  You keep feeling nauseous or you keep vomiting.  You feel light-headed.  You develop a rash.  You have a fever. Get help right away if:  You have trouble breathing. Summary  For several hours after your procedure, you may feel sleepy and have poor judgment.  Have a responsible adult stay with you for at least 24 hours or until you are awake and alert. This information is not intended to replace advice given to you by your health care provider. Make sure you discuss any questions you have with your health  care provider. Document Released: 06/13/2015 Document Revised: 05/21/2017 Document Reviewed: 06/13/2015 Elsevier Patient Education  2020 [employer].

## 2019-01-13 NOTE — Op Note (Signed)
[facility] Patient Name: Micheal Ross Procedure Date: 01/13/2019 12:13 PM MRN: [id number] Date of Birth: Apr 06, 1960 Attending MD: Norvel Richards , MD CSN: [id number] Age: 58 Admit Type: Outpatient Procedure:                Colonoscopy Indications:              High risk colon cancer surveillance: Personal                            history of colonic polyps Providers:                Norvel Richards, MD, Gwynneth Albright RN,                            RN, Randa Spike, Technician Referring MD:              Medicines:                Propofol per Anesthesia Complications:            No immediate complications. Estimated Blood Loss:     Estimated blood loss was minimal. Procedure:                Pre-Anesthesia Assessment:                           - Prior to the procedure, a History and Physical                            was performed, and patient medications and                            allergies were reviewed. The patient's tolerance of                            previous anesthesia was also reviewed. The risks                            and benefits of the procedure and the sedation                            options and risks were discussed with the patient.                            All questions were answered, and informed consent                            was obtained. Prior Anticoagulants: The patient has                            taken no previous anticoagulant or antiplatelet                            agents. ASA Grade Assessment: III - A patient with  severe systemic disease. After reviewing the risks                            and benefits, the patient was deemed in                            satisfactory condition to undergo the procedure.                           After obtaining informed consent, the colonoscope                            was passed under direct vision. Throughout the   procedure, the patient's blood pressure, pulse, and                            oxygen saturations were monitored continuously. The                            CF-[id number]) scope was introduced through                            the anus and advanced to the the cecum, identified                            by appendiceal orifice and ileocecal valve. The                            colonoscopy was performed without difficulty. The                            patient tolerated the procedure well. The quality                            of the bowel preparation was adequate. The                            ileocecal valve, appendiceal orifice, and rectum                            were photographed. Scope In: 12:39:04 PM Scope Out: 1:34:51 PM Scope Withdrawal Time: 0 hours 51 minutes 46 seconds  Total Procedure Duration: 0 hours 55 minutes 47 seconds  Findings:      Redundant elongated colon.The perianal and digital rectal examinations       were normal.      A 20 x 20 mm polyp was found in the cecum. Approach was difficult.       Respiratory excursions made resection challenging. This lesion lifted       nicely away from the colonic wall with 11.5 cc of Eloview injected at       the periphery of the lesion and dynamically within the center.       Subsequently, piecemeal hot snare polypectomy ensued multiple polyps       fragments recovered for the pathologist. It was difficult to say if this  lesion was removed in its entirety but certainly the vast majority of it       was removed. The polyp was removed with a hot snare. I suspect nearly       all the tissue was removed but complete resection could not be assured.       Estimated blood loss was minimal.      The exam was otherwise without abnormality on direct and retroflexion       views. Impression:               - One 20 mm polyp in the cecum, removed with a hot                            snare. Incomplete resection. Resected  tissue                            retrieved. Done:                           - The examination was otherwise normal on direct                            and retroflexion views. Moderate Sedation:      Moderate (conscious) sedation was personally administered by an       anesthesia professional. The following parameters were monitored: oxygen       saturation, heart rate, blood pressure, respiratory rate, EKG, adequacy       of pulmonary ventilation, and response to care. Recommendation:           - Patient has a contact number available for                            emergencies. The signs and symptoms of potential                            delayed complications were discussed with the                            patient. Return to normal activities tomorrow.                            Written discharge instructions were provided to the                            patient.                           - Resume previous diet.                           - Continue present medications. No aspirin or                            NSAIDs for 10 days                           - Repeat colonoscopy in 4 months  for surveillance.                            Discussed with wife. If pathology has high-grade                            aplasia or overt cancer, patient will need a right                            hemicolectomy.- Return to GI office in 4 months. Procedure Code(s):        --- Professional ---                           [phone], Colonoscopy, flexible; with removal of                            tumor(s), polyp(s), or other lesion(s) by snare                            technique Diagnosis Code(s):        --- Professional ---                           Z86.010, Personal history of colonic polyps                           K63.5, Polyp of colon CPT copyright 2019 American Medical Association. All rights reserved. The codes documented in this report are preliminary and upon coder review may  be revised to  meet current compliance requirements. [name]. [name], MD Norvel Richards, MD 01/13/2019 2:04:31 PM This report has been signed electronically. Number of Addenda: 0

## 2019-01-13 NOTE — Anesthesia Postprocedure Evaluation (Signed)
Anesthesia Post Note  Patient: Micheal Ross.  Procedure(s) Performed: COLONOSCOPY WITH PROPOFOL (N/A ) POLYPECTOMY  Patient location during evaluation: PACU Anesthesia Type: General Level of consciousness: awake and alert and patient cooperative Pain management: satisfactory to patient Vital Signs Assessment: post-procedure vital signs reviewed and stable Cardiovascular status: stable Postop Assessment: no apparent nausea or vomiting Anesthetic complications: no     Last Vitals:  Vitals:   01/13/19 1343 01/13/19 1345  BP: 129/84 131/81  Pulse: 98 100  Resp: (!) 23 (!) 21  Temp: 36.9 C   SpO2: 93% 93%    Last Pain:  Vitals:   01/13/19 1343  TempSrc:   PainSc: 0-No pain                 Hines Kloss

## 2019-01-13 NOTE — Transfer of Care (Signed)
Immediate Anesthesia Transfer of Care Note  Patient: Micheal Ross.  Procedure(s) Performed: COLONOSCOPY WITH PROPOFOL (N/A ) POLYPECTOMY  Patient Location: PACU  Anesthesia Type:General  Level of Consciousness: awake and patient cooperative  Airway & Oxygen Therapy: Patient Spontanous Breathing  Post-op Assessment: Report given to RN and Post -op Vital signs reviewed and stable  Post vital signs: Reviewed and stable  Last Vitals:  Vitals Value Taken Time  BP    Temp    Pulse 99 01/13/19 1343  Resp 23 01/13/19 1343  SpO2 92 % 01/13/19 1343  Vitals shown include unvalidated device data.  Last Pain:  Vitals:   01/13/19 1151  TempSrc: Oral  PainSc: 0-No pain         Complications: No apparent anesthesia complications  SEE PACU FLOW SHEETS FOR VITAL SIGNS

## 2019-01-13 NOTE — Anesthesia Preprocedure Evaluation (Signed)
Anesthesia Evaluation  Patient identified by MRN, date of birth, ID band Patient awake    Reviewed: Allergy & Precautions, NPO status , Patient's Chart, lab work & pertinent test results  Airway Mallampati: II  TM Distance: >3 FB Neck ROM: Full    Dental no notable dental hx. (+) Partial Upper   Pulmonary neg pulmonary ROS, former smoker,  Denies OSA -never formally tested -snores   Pulmonary exam normal breath sounds clear to auscultation       Cardiovascular Exercise Tolerance: Good hypertension, + DOE  Normal cardiovascular examI Rhythm:Regular Rate:Normal     Neuro/Psych Reports h/o meningioma resection in 2014 without incident or sequelae  negative neurological ROS  negative psych ROS   GI/Hepatic negative GI ROS, Neg liver ROS,   Endo/Other  Morbid obesity  Renal/GU negative Renal ROS  negative genitourinary   Musculoskeletal negative musculoskeletal ROS (+)   Abdominal   Peds negative pediatric ROS (+)  Hematology negative hematology ROS (+)   Anesthesia Other Findings   Reproductive/Obstetrics negative OB ROS                             Anesthesia Physical Anesthesia Plan  ASA: III  Anesthesia Plan: General   Post-op Pain Management:    Induction: Intravenous  PONV Risk Score and Plan: 2 and Propofol infusion, TIVA, Treatment may vary due to age or medical condition and Ondansetron  Airway Management Planned: Simple Face Mask and Nasal Cannula  Additional Equipment:   Intra-op Plan:   Post-operative Plan: Extubation in OR  Informed Consent: I have reviewed the patients History and Physical, chart, labs and discussed the procedure including the risks, benefits and alternatives for the proposed anesthesia with the patient or authorized representative who has indicated his/her understanding and acceptance.     Dental advisory given  Plan Discussed with:  CRNA  Anesthesia Plan Comments: (Plan Full PPE use  Plan GA with GETA as needed d/w pt -WTP with same after Q&A)        Anesthesia Quick Evaluation

## 2019-01-13 NOTE — H&P (Signed)
@LOGO @   Primary Care Physician:  Dettinger, Fransisca Kaufmann, MD Primary Gastroenterologist:  Dr. Gala Romney  Pre-Procedure History & Physical: HPI:  Micheal Ross. is a 58 y.o. male here for surveillance colonoscopy.  History of polyps removed in [place] 13 years ago.  Currently, no GI symptoms.  Past Medical History:  Diagnosis Date  . Brain tumor ([place])    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)  . Hypercholesterolemia   . Hypertension     Past Surgical History:  Procedure Laterality Date  . ANKLE SURGERY    . brain tumor removal  2014  . HERNIA REPAIR  [id number]   umbilical    Prior to Admission medications   Medication Sig Start Date End Date Taking? Authorizing Provider  aspirin EC 81 MG tablet Take 81 mg by mouth every other day.   Yes [provider]  lisinopril-hydrochlorothiazide (ZESTORETIC) 20-25 MG tablet Take 1 tablet by mouth daily. 12/19/18  Yes Dettinger, Fransisca Kaufmann, MD  lovastatin (MEVACOR) 20 MG tablet Take 1 tablet (20 mg total) by mouth every morning. 12/19/18  Yes Dettinger, Fransisca Kaufmann, MD  Multiple Vitamins-Minerals (ONE-A-DAY MENS 50+ ADVANTAGE) TABS Take 1 tablet by mouth daily.   Yes [provider]    Allergies as of 11/14/2018  . (No Known Allergies)    Family History  Problem Relation Age of Onset  . Thyroid disease Mother   . COPD Father   . Cancer Maternal Aunt   . Cancer Maternal Uncle        back  . Cancer Paternal Uncle        throat   . Cancer Cousin        prostate  . Colon cancer Neg Hx   . Colon polyps Neg Hx     Social History   Socioeconomic History  . Marital status: Married    Spouse name: Not on file  . Number of children: Not on file  . Years of education: Not on file  . Highest education level: Not on file  Occupational History  . Not on file  Social Needs  . Financial resource strain: Not on file  . Food insecurity    Worry: Not on file    Inability: Not on file  . Transportation needs    Medical: Not  on file    Non-medical: Not on file  Tobacco Use  . Smoking status: Former Smoker    Packs/day: 0.50    Years: 2.00    Pack years: 1.00    Quit date: 03/30/1983    Years since quitting: 35.8  . Smokeless tobacco: Never Used  Substance and Sexual Activity  . Alcohol use: Yes    Comment: occ  . Drug use: No  . Sexual activity: Yes    Comment: married for 1985, 6 grown kids  Lifestyle  . Physical activity    Days per week: Not on file    Minutes per session: Not on file  . Stress: Not on file  Relationships  . Social Herbalist on phone: Not on file    Gets together: Not on file    Attends religious service: Not on file    Active member of club or organization: Not on file    Attends meetings of clubs or organizations: Not on file    Relationship status: Not on file  . Intimate partner violence    Fear of current or ex partner: Not on file    Emotionally  abused: Not on file    Physically abused: Not on file    Forced sexual activity: Not on file  Other Topics Concern  . Not on file  Social History Narrative  . Not on file    Review of Systems: See HPI, otherwise negative ROS  Physical Exam: BP (!) 134/93   Pulse 99   Temp 98.4 F (36.9 C) (Oral)   Resp 18   SpO2 94%  General:   Alert,  Well-developed, well-nourished, pleasant and cooperative in NAD Neck:  Supple; no masses or thyromegaly. No significant cervical adenopathy. Lungs:  Clear throughout to auscultation.   No wheezes, crackles, or rhonchi. No acute distress. Heart:  Regular rate and rhythm; no murmurs, clicks, rubs,  or gallops. Abdomen: Non-distended, normal bowel sounds.  Soft and nontender without appreciable mass or hepatosplenomegaly.  Pulses:  Normal pulses noted. Extremities:  Without clubbing or edema.  Impression/Plan:  *Pleasant 58 year old gentleman here for surveillance colonoscopy.  History colonic polyps.  The risks, benefits, limitations, alternatives and imponderables have  been reviewed with the patient. Questions have been answered. All parties are agreeable.      Notice: This dictation was prepared with Dragon dictation along with smaller phrase technology. Any transcriptional errors that result from this process are unintentional and may not be corrected upon review.

## 2019-01-14 ENCOUNTER — Encounter: Payer: Self-pay | Admitting: Internal Medicine

## 2019-01-14 LAB — SURGICAL PATHOLOGY

## 2019-01-14 NOTE — Addendum Note (Signed)
Addendum  created 01/14/19 1034 by Mickel Baas, CRNA   Charge Capture section accepted

## 2019-01-15 ENCOUNTER — Ambulatory Visit (INDEPENDENT_AMBULATORY_CARE_PROVIDER_SITE_OTHER): Payer: PRIVATE HEALTH INSURANCE | Admitting: Gastroenterology

## 2019-01-15 ENCOUNTER — Other Ambulatory Visit: Payer: Self-pay

## 2019-01-15 ENCOUNTER — Encounter: Payer: Self-pay | Admitting: Gastroenterology

## 2019-01-15 ENCOUNTER — Telehealth: Payer: Self-pay

## 2019-01-15 ENCOUNTER — Encounter: Payer: Self-pay | Admitting: Family Medicine

## 2019-01-15 ENCOUNTER — Ambulatory Visit (INDEPENDENT_AMBULATORY_CARE_PROVIDER_SITE_OTHER): Payer: PRIVATE HEALTH INSURANCE | Admitting: Family Medicine

## 2019-01-15 VITALS — BP 139/92 | HR 82 | Temp 97.1°F | Ht 70.0 in | Wt 327.2 lb

## 2019-01-15 DIAGNOSIS — R29898 Other symptoms and signs involving the musculoskeletal system: Secondary | ICD-10-CM | POA: Insufficient documentation

## 2019-01-15 DIAGNOSIS — R0681 Apnea, not elsewhere classified: Secondary | ICD-10-CM | POA: Diagnosis not present

## 2019-01-15 DIAGNOSIS — J029 Acute pharyngitis, unspecified: Secondary | ICD-10-CM

## 2019-01-15 DIAGNOSIS — K219 Gastro-esophageal reflux disease without esophagitis: Secondary | ICD-10-CM | POA: Diagnosis not present

## 2019-01-15 DIAGNOSIS — K625 Hemorrhage of anus and rectum: Secondary | ICD-10-CM

## 2019-01-15 DIAGNOSIS — M542 Cervicalgia: Secondary | ICD-10-CM | POA: Diagnosis not present

## 2019-01-15 DIAGNOSIS — D369 Benign neoplasm, unspecified site: Secondary | ICD-10-CM

## 2019-01-15 DIAGNOSIS — R0602 Shortness of breath: Secondary | ICD-10-CM | POA: Insufficient documentation

## 2019-01-15 LAB — CBC WITH DIFFERENTIAL/PLATELET
Absolute Monocytes: 495 cells/uL (ref 200–950)
Basophils Absolute: 33 cells/uL (ref 0–200)
Basophils Relative: 0.5 %
Eosinophils Absolute: 112 cells/uL (ref 15–500)
Eosinophils Relative: 1.7 %
HCT: 39.3 % (ref 38.5–50.0)
Hemoglobin: 13.4 g/dL (ref 13.2–17.1)
Lymphs Abs: 1439 cells/uL (ref 850–3900)
MCH: 31.8 pg (ref 27.0–33.0)
MCHC: 34.1 g/dL (ref 32.0–36.0)
MCV: 93.1 fL (ref 80.0–100.0)
MPV: 12.6 fL — ABNORMAL HIGH (ref 7.5–12.5)
Monocytes Relative: 7.5 %
Neutro Abs: 4521 cells/uL (ref 1500–7800)
Neutrophils Relative %: 68.5 %
Platelets: 213 10*3/uL (ref 140–400)
RBC: 4.22 10*6/uL (ref 4.20–5.80)
RDW: 13.3 % (ref 11.0–15.0)
Total Lymphocyte: 21.8 %
WBC: 6.6 10*3/uL (ref 3.8–10.8)

## 2019-01-15 MED ORDER — AMOXICILLIN 500 MG PO CAPS: 500.0000 mg | ORAL_CAPSULE | Freq: Two times a day (BID) | ORAL | 0 refills | Status: DC

## 2019-01-15 NOTE — Progress Notes (Signed)
Virtual Visit via telephone Note  I connected with Micheal Ross. on 01/15/19 at 1700 by telephone and verified that I am speaking with the correct person using two identifiers. Micheal Ross. is currently located at home and no other people are currently with her during visit. The provider, Fransisca Kaufmann Dettinger, MD is located in their office at time of visit.  Call ended at 1710  I discussed the limitations, risks, security and privacy concerns of performing an evaluation and management service by telephone and the availability of in person appointments. I also discussed with the patient that there may be a patient responsible charge related to this service. The patient expressed understanding and agreed to proceed.   History and Present Illness: Patient is calling in for swelling in his neck and throat after having moved him around a lot in the colonoscopy.  His throat is swollen and she feels like something white in the back of his throat.  This started the day after. He denies any fevers or chills. They were concerned about sleep apnea in the procedure.  He has been having a sore throat since then and during the procedure they had to move around a lot and were concerned about apnea and that may be why his neck is sore but he feels like he is swollen on the inside of his tonsils as well.  No diagnosis found.  Outpatient Encounter Medications as of 01/15/2019  Medication Sig  . lisinopril-hydrochlorothiazide (ZESTORETIC) 20-25 MG tablet Take 1 tablet by mouth daily.  [name] Kitchen lovastatin (MEVACOR) 20 MG tablet Take 1 tablet (20 mg total) by mouth every morning.  . Multiple Vitamins-Minerals (ONE-A-DAY MENS 50+ ADVANTAGE) TABS Take 1 tablet by mouth daily.   No facility-administered encounter medications on file as of 01/15/2019.     Review of Systems  Constitutional: Negative for chills and fever.  HENT: Positive for congestion, sore throat and trouble swallowing. Negative for mouth  sores, sinus pain and sneezing.   Eyes: Negative for visual disturbance.  Respiratory: Negative for cough, shortness of breath and wheezing.   Cardiovascular: Negative for chest pain and leg swelling.  Musculoskeletal: Negative for back pain and gait problem.  Skin: Negative for rash.  All other systems reviewed and are negative.   Observations/Objective: Patient sounds comfortable in no acute distress  Assessment and Plan: Problem List Items Addressed This Visit    None    Visit Diagnoses    Pharyngitis, unspecified etiology    -  Primary   Apnea           Follow Up Instructions: Follow up as needed    I discussed the assessment and treatment plan with the patient. The patient was provided an opportunity to ask questions and all were answered. The patient agreed with the plan and demonstrated an understanding of the instructions.   The patient was advised to call back or seek an in-person evaluation if the symptoms worsen or if the condition fails to improve as anticipated.  The above assessment and management plan was discussed with the patient. The patient verbalized understanding of and has agreed to the management plan. Patient is aware to call the clinic if symptoms persist or worsen. Patient is aware when to return to the clinic for a follow-up visit. Patient educated on when it is appropriate to go to the emergency department.    I provided 10 minutes of non-face-to-face time during this encounter.    [name], MD

## 2019-01-15 NOTE — Telephone Encounter (Signed)
Spoke with RMR, pt needs to be seen in office at 3 pm today 01/15/2019. Pt is aware and will see [place] per RMR.

## 2019-01-15 NOTE — Assessment & Plan Note (Addendum)
58 year old male who recently had colonoscopy 2 days ago who presents today with a constellation of new symptoms.  He reports worsening shortness of breath with exertion and fatigue. No shortness of breath at rest. No chest pain, heart palpitations, cough, or other URI symptoms. He does have submandibular tenderness to palpation.  Reports he had submandibular swelling yesterday that has improved today.  Also reporting bilateral weakness/soreness in his thighs.  Do not suspect the onset of the symptoms are related to his procedure.  On exam, he had significant submandibular tenderness to palpation, small white plaque versus tonsil stone in the left posterior pharynx (difficult to visualize), lungs were clear to auscultation bilaterally, regular heart rate and rhythm, no swelling in lower extremities, and strength was 5 out of 5 in lower extremities. No history of clotting or bleeding disorders. Do not suspect PE as patient is without tachycardia and without shortness of breath in the office.  Query whether patient may be developing a viral illness.  He did have Covid testing on 01/10/2019 that was negative.  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  Denies going out to any public places since his procedure.   Advised patient to proceed to the emergency room if he develops any worsening shortness of breath. He was also advised to call his PCP today to discuss his shortness of breath, leg weakness, and submandibular tenderness.  Patient reported he was supposed to be talking to his PCP this afternoon anyway.

## 2019-01-15 NOTE — Telephone Encounter (Signed)
Per RMR-  Send letter to patient.  Send copy of letter with path to referring provider and PCP.   Needs office visit in 4 months to set up [place]

## 2019-01-15 NOTE — Telephone Encounter (Signed)
Pt called and had a TCS on Monday 01/13/2019. Pt noticed swelling on lt side of neck. Pt states he recalls the nurse saying his neck needed to repositioned a few times and he may have some soreness. Pt had a bowel movement this morning and noticed a far amount of blood. Pt is very fatigue and is getting a little worse. Pt was lightheaded yesterday and it's a little better this morning. Pt reports mild upper and lower abdominal pain.  [phone]

## 2019-01-15 NOTE — Patient Instructions (Addendum)
Continue monitoring for bright red blood per rectum. Call us if this occurs again.   Continue a clear liquid diet today. If you continue to do well the rest of the day without any further bleeding or significant abdominal pain, you may advance your diet tomorrow.   Continue to avoid aspirin and other NSAIDs including ibuprofen, Alive, Advil, and goody powders.   Follow a GERD diet. Avoid fatty, greasy, fried, spicy, and citrus foods. Avoid carbonated beverages, chocolate, and caffeine. Do not eat within 3 hours of laying down. Prop the head of your bed up 6 inches with wood or bricks. See handout below.   Please call you PCP today to let them know about your shortness of breath, leg weakness, and neck/throat soreness.   I suspect your neck soreness may be related to the jaw thrust that had to be performed during your procedure.   You should proceed to the emergency room if your shortness of breath worsens!   We will plan to see you back in 3 months. Call if you have any questions or concerns prior to your next visit. We will get you scheduled for repeat colonoscopy at next visit.   Aliene Altes, PA-C [facility]    Food Choices for Gastroesophageal Reflux Disease, Adult When you have gastroesophageal reflux disease (GERD), the foods you eat and your eating habits are very important. Choosing the right foods can help ease your discomfort. Think about working with a nutrition specialist (dietitian) to help you make good choices. What are tips for following this plan?  Meals  Choose healthy foods that are low in fat, such as fruits, vegetables, whole grains, low-fat dairy products, and lean meat, fish, and poultry.  Eat small meals often instead of 3 large meals a day. Eat your meals slowly, and in a place where you are relaxed. Avoid bending over or lying down until 2-3 hours after eating.  Avoid eating meals 2-3 hours before bed.  Avoid drinking a lot of liquid with  meals.  Cook foods using methods other than frying. Bake, grill, or broil food instead.  Avoid or limit: ? Chocolate. ? Peppermint or spearmint. ? Alcohol. ? Pepper. ? Black and decaffeinated coffee. ? Black and decaffeinated tea. ? Bubbly (carbonated) soft drinks. ? Caffeinated energy drinks and soft drinks.  Limit high-fat foods such as: ? Fatty meat or fried foods. ? Whole milk, cream, butter, or ice cream. ? Nuts and nut butters. ? Pastries, donuts, and sweets made with butter or shortening.  Avoid foods that cause symptoms. These foods may be different for everyone. Common foods that cause symptoms include: ? Tomatoes. ? Oranges, lemons, and limes. ? Peppers. ? Spicy food. ? Onions and garlic. ? Vinegar. Lifestyle  Maintain a healthy weight. Ask your doctor what weight is healthy for you. If you need to lose weight, work with your doctor to do so safely.  Exercise for at least 30 minutes for 5 or more days each week, or as told by your doctor.  Wear loose-fitting clothes.  Do not smoke. If you need help quitting, ask your doctor.  Sleep with the head of your bed higher than your feet. Use a wedge under the mattress or blocks under the bed frame to raise the head of the bed. Summary  When you have gastroesophageal reflux disease (GERD), food and lifestyle choices are very important in easing your symptoms.  Eat small meals often instead of 3 large meals a day. Eat your meals slowly,  and in a place where you are relaxed.  Limit high-fat foods such as fatty meat or fried foods.  Avoid bending over or lying down until 2-3 hours after eating.  Avoid peppermint and spearmint, caffeine, alcohol, and chocolate. This information is not intended to replace advice given to you by your health care provider. Make sure you discuss any questions you have with your health care provider. Document Released: 08/22/2011 Document Revised: 06/13/2018 Document Reviewed: 03/28/2016  Elsevier Patient Education  2020 [employer].

## 2019-01-15 NOTE — Assessment & Plan Note (Signed)
New onset of GERD symptoms since his colonoscopy 2 days ago. Doubt this is related to his procedure and more of a coincidental occurrence. Most suspicious that this is related to body habitus and dietary choices. Patient admits to symptoms occurring after having fatty meals and at night when laying down.  When asking to recall the last 24 hours, he had symptoms after having sausage for breakfast, after having [employer] last night, and after mashed potatoes and gravy this morning. No dysphagia. Minimal upper abdominal pain since his colonoscopy that he only feels when getting up. No current NSAIDs. Had been on daily aspirin. Offered starting patient on a PPI but he prefers to make dietary changes first.   Discussed GERD diet and lifestyle and provided handout.  Follow up in 3 months.

## 2019-01-15 NOTE — Telephone Encounter (Signed)
Noted. Spoke with pt. He is going to have labs done this morning. Orders placed and released. Pt will call back today at 3-4 pm. Pt is also going to do a clear liquid diet and avoid NSAIDS today as directed.

## 2019-01-15 NOTE — Assessment & Plan Note (Signed)
58 year old male who recently underwent colonoscopy on 01/13/2019 with 20 mm polyp found in the cecum that was removed via piecemeal hot snare polypectomy.  Difficult to say if lesion was removed in its entirety.  Pathology revealed tubulovillous adenoma.  He was advised to have repeat colonoscopy in 4 months.  Today patient presents due to 1 episode of bright red blood per rectum this morning.  No bright red blood per rectum since.  Stat CBC today with hemoglobin stable at 13.4.  He is without any significant abdominal pain.  Suspect this was a minor post polypectomy bleed secondary to softening of eschar.  Patient was advised to continue to monitor for any ongoing bleeding and call our office if this returns.  He will also continue clear liquid diet today and advance his diet tomorrow if he has no further bright red blood per rectum or abdominal pain.  He will continue to avoid aspirin and other NSAIDs for the next 10 days.  Follow-up in 3 months.  We will plan to schedule repeat colonoscopy at that time.

## 2019-01-15 NOTE — Assessment & Plan Note (Signed)
Patient reporting weakness/soreness in his thighs bilaterally that started this morning. Etiology is not clear. No injury or fall. He had colonoscopy Monday requiring jaw thrust and he does have submandibular jaw pain with palpation. Reports his neck was swollen in this area but has improved today. He has no neck pain without palpation. No pain with rotation of his neck. No posterior cervical neck pain. On exam, he has full 5/5 strength in the lower extremities. Symtpoms are not consistent with a cervical neck/spinal injury. Suspect this is more of a coincidental occurrence. Do not suspect this is related to his procedure. He is on lovastatin but has been on this for years. Urine is pale yellow to clear.   Advised he call and discuss this further with his PCP along with his other constellation of new symptoms including SOB with exertion and neck soreness. Patient reports he is supposed to be talking with his PCP later today.

## 2019-01-15 NOTE — Assessment & Plan Note (Signed)
58 y.o. male presenting with 1 episode of rectal bleeding follow his colonoscopy on Monday, 11/9, with piecemeal hot snare polypectomy of 20 mm cecal polyp. Patient has had 3-4 BMs since his procedure that were mushy without bright red blood or melena. This morning he had one BM with bright red blood that turned the toilet water red. No BM since this morning at 9:30 am. No melena. No significant abdominal pain. Mild subjective mid lower abdominal pain when changing positions but no tenderness to palpation of the lower abdomen on exam. He has not resumed his aspirin and will not for at least 10 days. Stat CBC today with hemoglobin stable at 13.4.   Suspect patient may have had mild post polypectomy bleeding from sloughing off of an eschar. Do not suspect this is a significant bleed as hemoglobin is stable and he has had no further bleeding today. Recommend he continue a clear diet today and monitor for any ongoing bleeding. Should he have any further bleeding he is to call us. If he continues without any significant abdominal pain tomorrow, he can advance his diet.  Continue to avoid ASA and other NSAIDs for the next 10 days.

## 2019-01-15 NOTE — Progress Notes (Signed)
Referring Provider: Dettinger, Fransisca Kaufmann, MD Primary Care Physician:  Dettinger, Fransisca Kaufmann, MD Primary GI Physician: Dr. Gala Romney  Chief Complaint  Patient presents with  . Rectal Bleeding    had procedure done Monday. reports RB with BM this morning, bright red    HPI:   Micheal Ross. is a 58 y.o. male presenting today for acute visit following TCS on 01/13/19 with 20 mm polyp found in the cecum that was removed via piecemeal hot snare polypectomy. Difficult to say if lesion was removed in its entirety. Pathology with tubulovillous adenoma. Advised no NSAIDs for 10 days. Repeat TCS in 4 months for surveillance. Patient called the office today reporting neck pain, rectal bleeding with BM this morning, mild upper and lower abdominal pain, fatigue, and lightheadedness. CBC was ordered, patient was advised to back off to clear liquid diet, and advised to come into the office at 3 PM to be seen today.   CBC today: Hemoglobin stable at 13.4.   Patient reports since the procedure swelling in his neck with soreness of both sides. Pointing to his jaw line.This started the day after procedure. No sore throat. No nasal congestion or coughing. No pain with rotation of his neck. No posterior neck pain. Doesn't feel pain if he isn't touching his neck. Swelling has improved some today.   Had terrible headache after procedure. This has resolved today.   Mild acid reflux. This started Tuesday after his procedure. This started yesterday morning. Will drink water and this helps some. States it is a stinging feeling in his esophagus. No nausea or vomiting. No dysphagia. Had fried chicken the night of his procedure. Then scrambled egg with [place] sausage for breakfast. Last night had [employer] (burger and fries). Occurs a few hours after eating. Worse when laying down.    Soreness in legs and blood in stool started this morning. Has had about 3-4 BMs since procedure. This morning was the first time he saw  bright red blood. Occurred at 9:30am. No other BM since. Turned toilet water red. Couldn't tell if blood was in the stool. Stool was mushy. No straining. No rectal pain or burning. No black stools. This was the first time he has ever experienced anything like this. No abdominal pain. Isolated mild soreness in the epigastric area and mid lower abdomen. States it is a very light pain. Doesn't really bother him.  Just known's it is there at times. Depends on what he is doing and how he moves. When going from sitting to standing. Doesn't feel it when he is walking around, sitting,or laying down. Only with position change. Present since the colonoscopy.   Reports his legs feel more weak in his thighs than normal. No injury or fall. Some soreness. Feels he is more fatigued. Feels his energy is low. Averages about 4-5 hours of sleep a night. This is his baseline. States just getting groceries from the car to the house this morning, he became very winded. This is knew. No history of clotting or bleeding disorders. 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 Has not been around anyone who has been sick. Has not been out to any stores etc since his procedure. COVID test negative on 01/10/19. No shortness of breath at rest. No swelling in legs. No chest pain or heart palpitations. No fever or chills. Some lightheadedness. Started yesterday. Symptoms occur when he stop moving. Feels like he is still  moving for a couple seconds. Does not have the sensation that he may pass out.    Past Medical History:  Diagnosis Date  . 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 Hypercholesterolemia   . Hypertension     Past Surgical History:  Procedure Laterality Date  . ANKLE SURGERY    . brain tumor removal  2014  . COLONOSCOPY WITH PROPOFOL  01/13/2019   20 mm polyp found in the cecum that was removed via piecemeal hot snare polypectomy. Repeat in 4 months.    [name] Kitchen HERNIA REPAIR  [id number]   umbilical    Current Outpatient Medications  Medication Sig Dispense Refill  . lisinopril-hydrochlorothiazide (ZESTORETIC) 20-25 MG tablet Take 1 tablet by mouth daily. 90 tablet 3  . lovastatin (MEVACOR) 20 MG tablet Take 1 tablet (20 mg total) by mouth every morning. 90 tablet 3  . Multiple Vitamins-Minerals (ONE-A-DAY MENS 50+ ADVANTAGE) TABS Take 1 tablet by mouth daily.     No current facility-administered medications for this visit.     Allergies as of 01/15/2019  . (No Known Allergies)    Family History  Problem Relation Age of Onset  . Thyroid disease Mother   . COPD Father   . Cancer Maternal Aunt   . Cancer Maternal Uncle        back  . Cancer Paternal Uncle        throat   . Cancer Cousin        prostate  . Colon cancer Neg Hx   . Colon polyps Neg Hx     Social History   Socioeconomic History  . Marital status: Married    Spouse name: Not on file  . Number of children: Not on file  . Years of education: Not on file  . Highest education level: Not on file  Occupational History  . Not on file  Social Needs  . Financial resource strain: Not on file  . Food insecurity    Worry: Not on file    Inability: Not on file  . Transportation needs    Medical: Not on file    Non-medical: Not on file  Tobacco Use  . Smoking status: Former Smoker    Packs/day: 0.50    Years: 2.00    Pack years: 1.00    Quit date: 03/30/1983    Years since quitting: 35.8  . Smokeless tobacco: Never Used  Substance and Sexual Activity  . Alcohol use: Yes    Comment: occ  . Drug use: No  . Sexual activity: Yes    Comment: married for 1985, 6 grown kids  Lifestyle  . Physical activity    Days per week: Not on file    Minutes per session: Not on file  . Stress: Not on file  Relationships  . Social Herbalist on phone: Not on file    Gets together: Not on file    Attends religious service: Not on file    Active member of club or  organization: Not on file    Attends meetings of clubs or organizations: Not on file    Relationship status: Not on file  Other Topics Concern  . Not on file  Social History Narrative  . Not on file    Review of Systems: Gen: See HPI CV: See HPI Resp: See HPI GI: See HPI GU: Urine is pale yellow to clear.  Derm: Denies rash Psych: Denies depression, anxiety Heme:  See HPI  Physical Exam: BP (!) 139/92   Pulse 82   Temp (!) 97.1 F (36.2 C) (Oral)   Ht 5\' 10"  (1.778 m)   Wt (!) 327 lb 3.2 oz (148.4 kg)   BMI 46.95 kg/m  General:   Alert and oriented. No distress noted. Pleasant and cooperative.   Head:  Normocephalic and atraumatic. Eyes:  Conjuctiva clear without scleral icterus. Mouth:  Oral mucosa pink and moist. Difficult to visualize posterior pharynx; however, brief visualization of white lesion in left posterior pharynx, tonsil stone vs white plaque.  Neck: Tenderness to palpation around the angle of the mandible/submandibular area. No tenderness to palpation in the area of anterior or posterior cervical lymph node chains. No pain with 90 degree neck rotation to the left and right. No posterior cervical spine tenderness to palpation.  Heart:  S1, S2 present without murmurs appreciated. Lungs:  Clear to auscultation bilaterally. No wheezes, rales, or rhonchi. No distress.  Abdomen:  +BS, soft, and non-distended. Minimal tenderness to deep palpation in the epigastric area. No rebound or guarding. No HSM or masses noted. Msk:  Symmetrical without gross deformities. Normal posture. Strength 5/5 in the lower extremities.  Extremities:  Without edema. Neurologic:  Alert and  oriented x4 Psych:  Normal mood and affect.

## 2019-01-15 NOTE — Telephone Encounter (Signed)
Back off to a clear liquid diet.  CBC now; progress report later today 3 or 4 PM.  Neck discomfort should go away.  Remember, no aspirin Advil or Aleve. Review letter with patient.

## 2019-01-15 NOTE — Assessment & Plan Note (Addendum)
Patient reports new onset of neck swelling/pain in the submandibular area that started after his colonoscopy. Swelling has improved. Pain is only present with palpation around the mandible/submandiobuklar area. No pain with neck rotation or posterior cervical neck tenderness on exam. No pain/trouble with with swallowing. No trouble breathing. Difficult to visualize the back of his throat on exam. Small white area briefly seen in the left posterior pharnyx, may have bene a tonsil stone. Denies sore throat or other URI symptoms. No Of note, after discussing with patient and Dr. Gala Romney, sounds like anesthesia did have to do a jaw thrust during procedure to help with airway management as patient likely has sleep apnea.   Suspect patients submandibular pain may be secondary to jaw thrust and will continue to improve with time. However, I have advised he call his PCP to discuss further. Can't rule out infectious process.

## 2019-01-16 ENCOUNTER — Encounter: Payer: Self-pay | Admitting: Internal Medicine

## 2019-01-16 ENCOUNTER — Telehealth: Payer: Self-pay | Admitting: Internal Medicine

## 2019-01-16 ENCOUNTER — Encounter (HOSPITAL_COMMUNITY): Payer: Self-pay | Admitting: Internal Medicine

## 2019-01-16 NOTE — Telephone Encounter (Signed)
Noted. Pt notified of RMR's recommendations.

## 2019-01-16 NOTE — Telephone Encounter (Signed)
Spoke with pt. Pt is still seeing  Blood in the toilet and on the tissue 3 times today. Blood is dark red. The amount of blood see is about a tablespoon. Blood isn't pouring. Pt feels a little better than he was feeling yesterday.

## 2019-01-16 NOTE — Telephone Encounter (Signed)
Hemoglobin looks really good yesterday.  Please make sure he is avoiding aspirin and all nonsteroidal agents.  I suspect this is a very small amount residual from the large polypectomy site.  Should disappear in the next 24 to 48 hours

## 2019-01-16 NOTE — Telephone Encounter (Signed)
OV made °

## 2019-01-16 NOTE — Telephone Encounter (Signed)
Pt was here yesterday and was calling [place] to let her know how is bowels were doing. [phone]

## 2019-01-17 ENCOUNTER — Other Ambulatory Visit: Payer: Self-pay | Admitting: Internal Medicine

## 2019-01-17 ENCOUNTER — Telehealth: Payer: Self-pay

## 2019-01-17 ENCOUNTER — Other Ambulatory Visit: Payer: Self-pay

## 2019-01-17 DIAGNOSIS — K625 Hemorrhage of anus and rectum: Secondary | ICD-10-CM

## 2019-01-17 LAB — CBC WITH DIFFERENTIAL/PLATELET
Absolute Monocytes: 748 cells/uL (ref 200–950)
Basophils Absolute: 26 cells/uL (ref 0–200)
Basophils Relative: 0.3 %
Eosinophils Absolute: 106 cells/uL (ref 15–500)
Eosinophils Relative: 1.2 %
HCT: 37.8 % — ABNORMAL LOW (ref 38.5–50.0)
Hemoglobin: 12.9 g/dL — ABNORMAL LOW (ref 13.2–17.1)
Lymphs Abs: 1478 cells/uL (ref 850–3900)
MCH: 31.9 pg (ref 27.0–33.0)
MCHC: 34.1 g/dL (ref 32.0–36.0)
MCV: 93.6 fL (ref 80.0–100.0)
MPV: 12.5 fL (ref 7.5–12.5)
Monocytes Relative: 8.5 %
Neutro Abs: 6442 cells/uL (ref 1500–7800)
Neutrophils Relative %: 73.2 %
Platelets: 249 10*3/uL (ref 140–400)
RBC: 4.04 10*6/uL — ABNORMAL LOW (ref 4.20–5.80)
RDW: 13.4 % (ref 11.0–15.0)
Total Lymphocyte: 16.8 %
WBC: 8.8 10*3/uL (ref 3.8–10.8)

## 2019-01-17 NOTE — Telephone Encounter (Signed)
Patient presented to the office and showed our nurse some cell phone photos of blood in his toilet.  I looked at the pictures as well.  Went to see him in room 1.  Appeared just fine ambulatory.  He has no abdominal pain.  He has been eating okay.  I reviewed the procedure note findings and the path with him I showed him pictures and printed him a copy of his colonoscopy report.  CBC on Wednesday look just fine.  He does have some ongoing trickling no doubt from the excavation side of the large cecal polyp removed.  I told him that it is likely that it may still stop on its own.  Worse case scenario, he may need a therapeutic colonoscopy in the near future.  At a minimum, he will need to follow-up colonoscopy in 4 months.  We will do a repeat CBC to today and see where he stands.  No need to go to the emergency room at this time.

## 2019-01-17 NOTE — Telephone Encounter (Signed)
Routing to [employer] for [employer]

## 2019-01-17 NOTE — Telephone Encounter (Signed)
Called the patient. Slight drop in hgb (from 13.4 to 12.9). Still with some trickling bleeding.  I gave him ER precautions/on-call GI precautions for any worsening or voluminous bleeding over the weekend especially if associated with chest pain, dyspnea, weakness, fatigue, dizziness, syncope.  AM: Please call the patient and check on him to see if he's still having bleeding. ALSO, he mentioned recent antibiotics by PCP and having some diarrhea with that. Likely antibiotic effect. If still having diarrhea (especially worsening) he may need stool studies.  Cc: RMR, JL for FYI Cc: [place] for follow-up on Monday

## 2019-01-17 NOTE — Telephone Encounter (Signed)
Per RMR- pt needs stat cbc and copy of procedure report. Orders done. Orders and copy of procedure and path given to the pt.

## 2019-01-17 NOTE — Telephone Encounter (Signed)
Pt came to the office, he is still seeing a lot of blood with bowel movements. Pt had a picture on his phone which shows quite a bit of blood in the toilet bowl. Pt is not having any pain or cramping. He is not currently dizzy or SOB. He stated right after he has a BM he does get a little "light headed" but that goes away. Showed picture to Dr.Rourk, who is going to go in and speak with the pt.

## 2019-01-18 NOTE — Telephone Encounter (Signed)
communication noted

## 2019-01-19 ENCOUNTER — Telehealth (INDEPENDENT_AMBULATORY_CARE_PROVIDER_SITE_OTHER): Payer: Self-pay | Admitting: Internal Medicine

## 2019-01-19 ENCOUNTER — Observation Stay (HOSPITAL_COMMUNITY)
Admission: EM | Admit: 2019-01-19 | Discharge: 2019-01-21 | Disposition: A | Discharge status: Home / Self Care | Payer: PRIVATE HEALTH INSURANCE | Attending: Gastroenterology | Admitting: Gastroenterology

## 2019-01-19 ENCOUNTER — Encounter (HOSPITAL_COMMUNITY): Payer: Self-pay | Admitting: *Deleted

## 2019-01-19 ENCOUNTER — Other Ambulatory Visit: Payer: Self-pay

## 2019-01-19 DIAGNOSIS — E7800 Pure hypercholesterolemia, unspecified: Secondary | ICD-10-CM | POA: Diagnosis not present

## 2019-01-19 DIAGNOSIS — Z87891 Personal history of nicotine dependence: Secondary | ICD-10-CM | POA: Diagnosis not present

## 2019-01-19 DIAGNOSIS — Z8042 Family history of malignant neoplasm of prostate: Secondary | ICD-10-CM | POA: Diagnosis not present

## 2019-01-19 DIAGNOSIS — I10 Essential (primary) hypertension: Secondary | ICD-10-CM | POA: Diagnosis not present

## 2019-01-19 DIAGNOSIS — E785 Hyperlipidemia, unspecified: Secondary | ICD-10-CM | POA: Insufficient documentation

## 2019-01-19 DIAGNOSIS — K922 Gastrointestinal hemorrhage, unspecified: Secondary | ICD-10-CM | POA: Diagnosis present

## 2019-01-19 DIAGNOSIS — Z79899 Other long term (current) drug therapy: Secondary | ICD-10-CM | POA: Diagnosis not present

## 2019-01-19 DIAGNOSIS — R000 Tachycardia, unspecified: Secondary | ICD-10-CM | POA: Insufficient documentation

## 2019-01-19 DIAGNOSIS — Z825 Family history of asthma and other chronic lower respiratory diseases: Secondary | ICD-10-CM | POA: Insufficient documentation

## 2019-01-19 DIAGNOSIS — Q438 Other specified congenital malformations of intestine: Secondary | ICD-10-CM | POA: Diagnosis not present

## 2019-01-19 DIAGNOSIS — D120 Benign neoplasm of cecum: Secondary | ICD-10-CM | POA: Diagnosis not present

## 2019-01-19 DIAGNOSIS — Z20828 Contact with and (suspected) exposure to other viral communicable diseases: Secondary | ICD-10-CM | POA: Diagnosis not present

## 2019-01-19 DIAGNOSIS — Y839 Surgical procedure, unspecified as the cause of abnormal reaction of the patient, or of later complication, without mention of misadventure at the time of the procedure: Secondary | ICD-10-CM | POA: Diagnosis not present

## 2019-01-19 DIAGNOSIS — K625 Hemorrhage of anus and rectum: Secondary | ICD-10-CM | POA: Diagnosis not present

## 2019-01-19 DIAGNOSIS — N179 Acute kidney failure, unspecified: Secondary | ICD-10-CM | POA: Diagnosis not present

## 2019-01-19 DIAGNOSIS — Z6841 Body Mass Index (BMI) 40.0 and over, adult: Secondary | ICD-10-CM | POA: Diagnosis not present

## 2019-01-19 DIAGNOSIS — R0602 Shortness of breath: Secondary | ICD-10-CM | POA: Insufficient documentation

## 2019-01-19 DIAGNOSIS — K219 Gastro-esophageal reflux disease without esophagitis: Secondary | ICD-10-CM | POA: Diagnosis not present

## 2019-01-19 DIAGNOSIS — K633 Ulcer of intestine: Secondary | ICD-10-CM | POA: Diagnosis not present

## 2019-01-19 DIAGNOSIS — Z800 Family history of malignant neoplasm of digestive organs: Secondary | ICD-10-CM | POA: Diagnosis not present

## 2019-01-19 DIAGNOSIS — K91840 Postprocedural hemorrhage and hematoma of a digestive system organ or structure following a digestive system procedure: Principal | ICD-10-CM | POA: Insufficient documentation

## 2019-01-19 DIAGNOSIS — D62 Acute posthemorrhagic anemia: Secondary | ICD-10-CM | POA: Insufficient documentation

## 2019-01-19 DIAGNOSIS — Z8349 Family history of other endocrine, nutritional and metabolic diseases: Secondary | ICD-10-CM | POA: Insufficient documentation

## 2019-01-19 DIAGNOSIS — K644 Residual hemorrhoidal skin tags: Secondary | ICD-10-CM | POA: Insufficient documentation

## 2019-01-19 DIAGNOSIS — K648 Other hemorrhoids: Secondary | ICD-10-CM | POA: Insufficient documentation

## 2019-01-19 DIAGNOSIS — Z86010 Personal history of colonic polyps: Secondary | ICD-10-CM | POA: Diagnosis not present

## 2019-01-19 HISTORY — DX: Sleep apnea, unspecified: G47.30

## 2019-01-19 LAB — CBC WITH DIFFERENTIAL/PLATELET
Abs Immature Granulocytes: 0.03 10*3/uL (ref 0.00–0.07)
Basophils Absolute: 0.1 10*3/uL (ref 0.0–0.1)
Basophils Relative: 1 %
Eosinophils Absolute: 0.1 10*3/uL (ref 0.0–0.5)
Eosinophils Relative: 1 %
HCT: 30.5 % — ABNORMAL LOW (ref 39.0–52.0)
Hemoglobin: 10.2 g/dL — ABNORMAL LOW (ref 13.0–17.0)
Immature Granulocytes: 0 %
Lymphocytes Relative: 21 %
Lymphs Abs: 1.7 10*3/uL (ref 0.7–4.0)
MCH: 32.2 pg (ref 26.0–34.0)
MCHC: 33.4 g/dL (ref 30.0–36.0)
MCV: 96.2 fL (ref 80.0–100.0)
Monocytes Absolute: 0.6 10*3/uL (ref 0.1–1.0)
Monocytes Relative: 8 %
Neutro Abs: 5.7 10*3/uL (ref 1.7–7.7)
Neutrophils Relative %: 69 %
Platelets: 207 10*3/uL (ref 150–400)
RBC: 3.17 MIL/uL — ABNORMAL LOW (ref 4.22–5.81)
RDW: 12.5 % (ref 11.5–15.5)
WBC: 8.2 10*3/uL (ref 4.0–10.5)
nRBC: 0 % (ref 0.0–0.2)

## 2019-01-19 LAB — BASIC METABOLIC PANEL
Anion gap: 8 (ref 5–15)
BUN: 18 mg/dL (ref 6–20)
CO2: 26 mmol/L (ref 22–32)
Calcium: 8.8 mg/dL — ABNORMAL LOW (ref 8.9–10.3)
Chloride: 102 mmol/L (ref 98–111)
Creatinine, Ser: 1.45 mg/dL — ABNORMAL HIGH (ref 0.61–1.24)
GFR calc Af Amer: >60 mL/min (ref >60)
GFR calc non Af Amer: 52 mL/min — ABNORMAL LOW (ref >60)
Glucose, Bld: 136 mg/dL — ABNORMAL HIGH (ref 70–99)
Potassium: 3.4 mmol/L — ABNORMAL LOW (ref 3.5–5.1)
Sodium: 136 mmol/L (ref 135–145)

## 2019-01-19 LAB — I-STAT CHEM 8, ED
BUN: 19 mg/dL (ref 6–20)
Calcium, Ion: 0.96 mmol/L — ABNORMAL LOW (ref 1.15–1.40)
Chloride: 101 mmol/L (ref 98–111)
Creatinine, Ser: 1.6 mg/dL — ABNORMAL HIGH (ref 0.61–1.24)
Glucose, Bld: 128 mg/dL — ABNORMAL HIGH (ref 70–99)
HCT: 30 % — ABNORMAL LOW (ref 39.0–52.0)
Hemoglobin: 10.2 g/dL — ABNORMAL LOW (ref 13.0–17.0)
Potassium: 4 mmol/L (ref 3.5–5.1)
Sodium: 136 mmol/L (ref 135–145)
TCO2: 28 mmol/L (ref 22–32)

## 2019-01-19 LAB — PROTIME-INR
INR: 1 (ref 0.8–1.2)
Prothrombin Time: 13.5 seconds (ref 11.4–15.2)

## 2019-01-19 LAB — APTT: aPTT: 28 seconds (ref 24–36)

## 2019-01-19 MED ORDER — SODIUM CHLORIDE 0.9 % IV BOLUS
1000.0000 mL | Freq: Once | INTRAVENOUS | Status: AC
  Administered 2019-01-19: 19:00:00 1000 mL via INTRAVENOUS

## 2019-01-19 MED ORDER — ONDANSETRON HCL 4 MG/2ML IJ SOLN: 4.0000 mg | Freq: Four times a day (QID) | INTRAMUSCULAR | Status: DC | PRN

## 2019-01-19 MED ORDER — PRAVASTATIN SODIUM 10 MG PO TABS
20.0000 mg | ORAL_TABLET | Freq: Every day | ORAL | Status: DC
  Filled 2019-01-19 (×2): qty 1

## 2019-01-19 MED ORDER — ONDANSETRON HCL 4 MG PO TABS: 4.0000 mg | ORAL_TABLET | Freq: Four times a day (QID) | ORAL | Status: DC | PRN

## 2019-01-19 MED ORDER — ACETAMINOPHEN 325 MG PO TABS: 650.0000 mg | ORAL_TABLET | Freq: Four times a day (QID) | ORAL | Status: DC | PRN

## 2019-01-19 MED ORDER — ACETAMINOPHEN 650 MG RE SUPP: 650.0000 mg | Freq: Four times a day (QID) | RECTAL | Status: DC | PRN

## 2019-01-19 MED ORDER — LACTATED RINGERS IV SOLN
INTRAVENOUS | Status: DC
  Administered 2019-01-19: 23:00:00 via INTRAVENOUS

## 2019-01-19 NOTE — H&P (Signed)
TRH H&P    Patient Demographics:    Micheal Ross, is a 58 y.o. male  MRN: [id number]  DOB - 02/16/1961  Admit Date - 01/19/2019  Referring MD/NP/PA: Dr. Roderic Palau  Outpatient Primary MD for the patient is Dettinger, Fransisca Kaufmann, MD  Patient coming from: Home  Chief complaint-rectal bleeding   HPI:    Micheal Ross  is a 58 y.o. 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  Patient states that he started passing blood in the stool day after colonoscopy.  And had 4-6 loose BM every day.  Today he had 6 episodes of loose BM which were grossly bloody.  Incidentally patient was put on amoxicillin by PCP on 01/15/2019 for pharyngitis. He denies nausea or vomiting. Denies chest pain or shortness of breath. Complains of lower abdominal cramping but no fever. He denies any previous history of bleeding problems, No family history of bleeding problems Patient called gastroenterologist office and recommended to come to the ED for further evaluation. In the ED, patient's hemoglobin was 10.2, it was 13.4 on 01/15/2019.    Review of systems:    In addition to the HPI above,    All other systems reviewed and are negative.    Past History of the following :    Past Medical History:  Diagnosis Date  . Brain tumor ([place])    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)  . Hypercholesterolemia   . Hypertension       Past Surgical History:  Procedure Laterality Date  . ANKLE SURGERY    . brain tumor removal  2014  . COLONOSCOPY WITH PROPOFOL  01/13/2019   20 mm polyp found in the cecum that was removed via piecemeal hot snare polypectomy. Repeat in 4 months.   .  COLONOSCOPY WITH PROPOFOL N/A 01/13/2019   Procedure: COLONOSCOPY WITH PROPOFOL;  Surgeon: Daneil Dolin, MD;  [facility];  Service: Endoscopy;  Laterality: N/A;  1:15pm  . HERNIA REPAIR  [id number]   umbilical  . POLYPECTOMY  01/13/2019   Procedure: POLYPECTOMY;  Surgeon: Daneil Dolin, MD;  Location: [facility];  Service: Endoscopy;;  Large Cecal Polyp       Social History:      Social History   Tobacco Use  . Smoking status: Former Smoker    Packs/day: 0.50    Years: 2.00    Pack years: 1.00    Quit date: 03/30/1983    Years since quitting: 35.8  . Smokeless tobacco: Never Used  Substance Use Topics  . Alcohol use: Yes    Comment: occ       Family History :     Family History  Problem Relation Age of Onset  . Thyroid disease Mother   . COPD Father   . Cancer Maternal Aunt   . Cancer Maternal Uncle  back  . Cancer Paternal Uncle        throat   . Cancer Cousin        prostate  . Colon cancer Neg Hx   . Colon polyps Neg Hx       Home Medications:   Prior to Admission medications   Medication Sig Start Date End Date Taking? Authorizing Provider  amoxicillin (AMOXIL) 500 MG capsule Take 1 capsule (500 mg total) by mouth 2 (two) times daily. 01/15/19  Yes Dettinger, Fransisca Kaufmann, MD  lisinopril-hydrochlorothiazide (ZESTORETIC) 20-25 MG tablet Take 1 tablet by mouth daily. 12/19/18  Yes Dettinger, Fransisca Kaufmann, MD  lovastatin (MEVACOR) 20 MG tablet Take 1 tablet (20 mg total) by mouth every morning. 12/19/18  Yes Dettinger, Fransisca Kaufmann, MD  Multiple Vitamins-Minerals (ONE-A-DAY MENS 50+ ADVANTAGE) TABS Take 1 tablet by mouth daily.   Yes [provider]     Allergies:    No Known Allergies   Physical Exam:   Vitals  Blood pressure 115/76, pulse (!) 114, temperature 98.5 F (36.9 C), temperature source Oral, resp. rate 20, height 5\' 10"  (1.778 m), weight (!) 147.4 kg, SpO2 98 %.  1.  General: Appears in no acute distress  2.  Psychiatric: Alert, oriented x3, intact insight and judgment  3. Neurologic: Cranial nerves II through XII grossly intact, no focal deficit noted at this time  4. HEENMT:  Atraumatic normocephalic, extraocular muscles are intact  5. Respiratory : Clear to auscultation bilaterally  6. Cardiovascular : S1-S2, regular, no murmur auscultated  7. Gastrointestinal:  Abdomen is soft, mild tenderness in right and left lower quadrant, no rigidity or guarding, no organomegaly  8. Skin:  No rashes noted      Data Review:    CBC Recent Labs  Lab 01/15/19 1021 01/17/19 1131 01/19/19 1821 01/19/19 1837  WBC 6.6 8.8 8.2  --   HGB 13.4 12.9* 10.2* 10.2*  HCT 39.3 37.8* 30.5* 30.0*  PLT 213 249 207  --   MCV 93.1 93.6 96.2  --   MCH 31.8 31.9 32.2  --   MCHC 34.1 34.1 33.4  --   RDW 13.3 13.4 12.5  --   LYMPHSABS 1,439 1,478 1.7  --   MONOABS  --   --  0.6  --   EOSABS 112 106 0.1  --   BASOSABS 33 26 0.1  --    ------------------------------------------------------------------------------------------------------------------  Results for orders placed or performed during the hospital encounter of 01/19/19 (from the past 48 hour(s))  CBC with Differential     Status: Abnormal   Collection Time: 01/19/19  6:21 PM  Result Value Ref Range   WBC 8.2 4.0 - 10.5 K/uL   RBC 3.17 (L) 4.22 - 5.81 MIL/uL   Hemoglobin 10.2 (L) 13.0 - 17.0 g/dL   HCT 30.5 (L) 39.0 - 52.0 %   MCV 96.2 80.0 - 100.0 fL   MCH 32.2 26.0 - 34.0 pg   MCHC 33.4 30.0 - 36.0 g/dL   RDW 12.5 11.5 - 15.5 %   Platelets 207 150 - 400 K/uL   nRBC 0.0 0.0 - 0.2 %   Neutrophils Relative % 69 %   Neutro Abs 5.7 1.7 - 7.7 K/uL   Lymphocytes Relative 21 %   Lymphs Abs 1.7 0.7 - 4.0 K/uL   Monocytes Relative 8 %   Monocytes Absolute 0.6 0.1 - 1.0 K/uL   Eosinophils Relative 1 %   Eosinophils Absolute 0.1 0.0 - 0.5 K/uL  Basophils Relative 1 %   Basophils Absolute 0.1 0.0 - 0.1 K/uL   Immature Granulocytes 0 %    Abs Immature Granulocytes 0.03 0.00 - 0.07 K/uL    Comment: Performed at [facility], [address], [place], [place] [id number]  Basic metabolic panel     Status: Abnormal   Collection Time: 01/19/19  6:21 PM  Result Value Ref Range   Sodium 136 135 - 145 mmol/L   Potassium 3.4 (L) 3.5 - 5.1 mmol/L   Chloride 102 98 - 111 mmol/L   CO2 26 22 - 32 mmol/L   Glucose, Bld 136 (H) 70 - 99 mg/dL   BUN 18 6 - 20 mg/dL   Creatinine, Ser 1.45 (H) 0.61 - 1.24 mg/dL   Calcium 8.8 (L) 8.9 - 10.3 mg/dL   GFR calc non Af Amer 52 (L) >60 mL/min   GFR calc Af Amer >60 >60 mL/min   Anion gap 8 5 - 15    Comment: Performed at [facility], [address], [place],  [address]  I-stat chem 8, ED (not at [facility] or [facility])     Status: Abnormal   Collection Time: 01/19/19  6:37 PM  Result Value Ref Range   Sodium 136 135 - 145 mmol/L   Potassium 4.0 3.5 - 5.1 mmol/L   Chloride 101 98 - 111 mmol/L   BUN 19 6 - 20 mg/dL   Creatinine, Ser 1.60 (H) 0.61 - 1.24 mg/dL   Glucose, Bld 128 (H) 70 - 99 mg/dL   Calcium, Ion 0.96 (L) 1.15 - 1.40 mmol/L   TCO2 28 22 - 32 mmol/L   Hemoglobin 10.2 (L) 13.0 - 17.0 g/dL   HCT 30.0 (L) 39.0 - 52.0 %    Chemistries  Recent Labs  Lab 01/19/19 1821 01/19/19 1837  NA 136 136  K 3.4* 4.0  CL 102 101  CO2 26  --   GLUCOSE 136* 128*  BUN 18 19  CREATININE 1.45* 1.60*  CALCIUM 8.8*  --    ------------------------------------------------------------------------------------------------------------------  ------------------------------------------------------------------------------------------------------------------ GFR: Estimated Creatinine Clearance: 72.3 mL/min (A) (by C-G formula based on SCr of 1.6 mg/dL (H)). Liver Function Tests: No results for input(s): AST, ALT, ALKPHOS, BILITOT, PROT, ALBUMIN in the last 168 hours. No results for input(s): LIPASE, AMYLASE in the last 168 hours. No results for input(s): AMMONIA in the last 168 hours. Coagulation  Profile: No results for input(s): INR, PROTIME in the last 168 hours. Cardiac Enzymes: No results for input(s): CKTOTAL, CKMB, CKMBINDEX, TROPONINI in the last 168 hours. BNP (last 3 results) No results for input(s): PROBNP in the last 8760 hours. HbA1C: No results for input(s): HGBA1C in the last 72 hours. CBG: No results for input(s): GLUCAP in the last 168 hours. Lipid Profile: No results for input(s): CHOL, HDL, LDLCALC, TRIG, CHOLHDL, LDLDIRECT in the last 72 hours. Thyroid Function Tests: No results for input(s): TSH, T4TOTAL, FREET4, T3FREE, THYROIDAB in the last 72 hours. Anemia Panel: No results for input(s): VITAMINB12, FOLATE, FERRITIN, TIBC, IRON, RETICCTPCT in the last 72 hours.  --------------------------------------------------------------------------------------------------------------- Urine analysis: No results found for: COLORURINE, APPEARANCEUR, LABSPEC, PHURINE, GLUCOSEU, HGBUR, BILIRUBINUR, KETONESUR, PROTEINUR, UROBILINOGEN, NITRITE, LEUKOCYTESUR    Imaging Results:    No results found.  My personal review of EKG: Rhythm NSR, no ST-T changes   Assessment & Plan:    Active Problems:   Rectal bleeding   1. Rectal bleeding-most likely post polypectomy bleed, he does not have any fever, focal tenderness or leukocytosis.  Patient also had diarrhea likely from amoxicillin, which could have exacerbated bleeding.  Will obtain PT/PTT, check CBC in a.m.   2. Diarrhea-likely antibiotic associated.  He was started on amoxicillin by PCP for pharyngitis.  Will hold amoxicillin at this time.  3. Anemia, acute blood loss-patient has ABL anemia from rectal bleeding, post polypectomy.  Hemoglobin is 10.2.  Check CBC in a.m.  Hemodynamically patient is stable as above.  No immediate need for PRBC transfusion.  4. Acute kidney injury-patient baseline creatinine is around 1.17, today presenting with creatinine of 1.60.  Likely from diarrhea, ongoing GI losses.  Will start LR  at 75 mL/h.  Check BMP in a.m.  Hold Zestoretic.  5. Hypertension-blood pressure is soft,  lisinopril/HCTZ on hold as above.  We will continue to monitor patient's blood pressure.  6. Hyperlipidemia-continue lovastatin.    DVT Prophylaxis-   SCDs   AM Labs Ordered, also please review Full Orders  Family Communication: Admission, patients condition and plan of care including tests being ordered have been discussed with the patient who indicate understanding and agree with the plan and Code Status.  Code Status: Full code  Admission status: Observation: Based on patients clinical presentation and evaluation of above clinical data, I have made determination that patient meets Inpatient criteria at this time.  Time spent in minutes : 60 minutes   Oswald Hillock M.D on 01/19/2019 at 9:57 PM         ;

## 2019-01-19 NOTE — ED Triage Notes (Signed)
Pt with dark stools to more brighter stools since Monday.  colonoscopy done on Monday.  Pt with mild sob and c/o dizziness since ambulating to triage room.

## 2019-01-19 NOTE — Telephone Encounter (Signed)
Patient called stating that he has been passing bright red blood per rectum over the last 5 days. He has been to the bathroom 4 times today.  He feels lightheaded and dizzy. Patient had colonoscopy with polypectomy.  He had a very large polyp removed. Patient is not taking aspirin or other NSAIDs. Patient advised to come to emergency room for evaluation.

## 2019-01-19 NOTE — ED Provider Notes (Signed)
[facility] EMERGENCY DEPARTMENT Provider Note   CSN: [id number] Arrival date & time: 01/19/19  1721     History   Chief Complaint Chief Complaint  Patient presents with  . GI Bleeding    HPI Micheal Ross. is a 58 y.o. male.     States that he has been having bleeding since Monday about 4-6 times a day.  Patient had a colonoscopy with a biopsy done on Monday.  The history is provided by the patient. No language interpreter was used.  Weakness Severity:  Moderate Onset quality:  Sudden Timing:  Constant Progression:  Worsening Chronicity:  New Context: not alcohol use   Relieved by:  Nothing Worsened by:  Nothing Ineffective treatments:  None tried Associated symptoms: no abdominal pain, no chest pain, no cough, no diarrhea, no frequency, no headaches and no seizures     Past Medical History:  Diagnosis Date  . Brain tumor ([place])    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)  . Hypercholesterolemia   . Hypertension     Patient Active Problem List   Diagnosis Date Noted  . Neck pain 01/15/2019  . Weakness of both lower extremities 01/15/2019  . 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 Rectal bleeding 01/15/2019  . Gastroesophageal reflux disease 01/15/2019  . Shortness of breath 01/15/2019  . History of colonic polyps 11/14/2018  . Hypertension 03/29/2018  . Hyperlipidemia 03/29/2018  . Influenza A 04/20/2017  . Meningioma ([place]) 09/20/2012    Past Surgical History:  Procedure Laterality Date  . ANKLE SURGERY    . brain tumor removal  2014  . COLONOSCOPY WITH PROPOFOL  01/13/2019   20 mm polyp found in the cecum that was removed via piecemeal hot snare polypectomy. Repeat in 4 months.   . COLONOSCOPY WITH PROPOFOL N/A 01/13/2019   Procedure: COLONOSCOPY WITH PROPOFOL;  Surgeon: Daneil Dolin, MD;  Location[facility];  Service: Endoscopy;  Laterality: N/A;  1:15pm  . HERNIA REPAIR  [id number]   umbilical  . POLYPECTOMY  01/13/2019   Procedure: POLYPECTOMY;   Surgeon: Daneil Dolin, MD;  Location: [facility];  Service: Endoscopy;;  Large Cecal Polyp         Home Medications    Prior to Admission medications   Medication Sig Start Date End Date Taking? Authorizing Provider  amoxicillin (AMOXIL) 500 MG capsule Take 1 capsule (500 mg total) by mouth 2 (two) times daily. 01/15/19   Dettinger, Fransisca Kaufmann, MD  lisinopril-hydrochlorothiazide (ZESTORETIC) 20-25 MG tablet Take 1 tablet by mouth daily. 12/19/18   Dettinger, Fransisca Kaufmann, MD  lovastatin (MEVACOR) 20 MG tablet Take 1 tablet (20 mg total) by mouth every morning. 12/19/18   Dettinger, Fransisca Kaufmann, MD  Multiple Vitamins-Minerals (ONE-A-DAY MENS 50+ ADVANTAGE) TABS Take 1 tablet by mouth daily.    [provider]    Family History Family History  Problem Relation Age of Onset  . Thyroid disease Mother   . COPD Father   . Cancer Maternal Aunt   . Cancer Maternal Uncle        back  . Cancer Paternal Uncle        throat   . Cancer Cousin        prostate  . Colon cancer Neg Hx   . Colon polyps Neg Hx     Social History Social History   Tobacco Use  . Smoking status: Former Smoker    Packs/day: 0.50    Years: 2.00    Pack years: 1.00  Quit date: 03/30/1983    Years since quitting: 35.8  . Smokeless tobacco: Never Used  Substance Use Topics  . Alcohol use: Yes    Comment: occ  . Drug use: No     Allergies   Patient has no known allergies.   Review of Systems Review of Systems  Constitutional: Negative for appetite change and fatigue.  HENT: Negative for congestion, ear discharge and sinus pressure.   Eyes: Negative for discharge.  Respiratory: Negative for cough.   Cardiovascular: Negative for chest pain.  Gastrointestinal: Negative for abdominal pain and diarrhea.       GI bleeding  Genitourinary: Negative for frequency and hematuria.  Musculoskeletal: Negative for back pain.  Skin: Negative for rash.  Neurological: Positive for weakness. Negative for  seizures and headaches.  Psychiatric/Behavioral: Negative for hallucinations.     Physical Exam Updated Vital Signs BP 115/76 (BP Location: Right Arm)   Pulse (!) 114   Temp 98.5 F (36.9 C) (Oral)   Resp 20   Ht 5\' 10"  (1.778 m)   Wt (!) 147.4 kg   SpO2 98%   BMI 46.63 kg/m   Physical Exam Vitals signs and nursing note reviewed.  Constitutional:      Appearance: He is well-developed.  HENT:     Head: Normocephalic.     Nose: Nose normal.  Eyes:     General: No scleral icterus.    Conjunctiva/sclera: Conjunctivae normal.  Neck:     Musculoskeletal: Neck supple.     Thyroid: No thyromegaly.  Cardiovascular:     Rate and Rhythm: Regular rhythm. Tachycardia present.     Heart sounds: No murmur. No friction rub. No gallop.   Pulmonary:     Breath sounds: No stridor. No wheezing or rales.  Chest:     Chest wall: No tenderness.  Abdominal:     General: There is no distension.     Tenderness: There is no abdominal tenderness. There is no rebound.  Musculoskeletal: Normal range of motion.  Lymphadenopathy:     Cervical: No cervical adenopathy.  Skin:    Findings: No erythema or rash.  Neurological:     Mental Status: He is alert and oriented to person, place, and time.     Motor: No abnormal muscle tone.     Coordination: Coordination normal.  Psychiatric:        Behavior: Behavior normal.      ED Treatments / Results  Labs (all labs ordered are listed, but only abnormal results are displayed) Labs Reviewed  CBC WITH DIFFERENTIAL/PLATELET - Abnormal; Notable for the following components:      Result Value   RBC 3.17 (*)    Hemoglobin 10.2 (*)    HCT 30.5 (*)    All other components within normal limits  BASIC METABOLIC PANEL - Abnormal; Notable for the following components:   Potassium 3.4 (*)    Glucose, Bld 136 (*)    Creatinine, Ser 1.45 (*)    Calcium 8.8 (*)    GFR calc non Af Amer 52 (*)    All other components within normal limits  I-STAT CHEM  8, ED - Abnormal; Notable for the following components:   Creatinine, Ser 1.60 (*)    Glucose, Bld 128 (*)    Calcium, Ion 0.96 (*)    Hemoglobin 10.2 (*)    HCT 30.0 (*)    All other components within normal limits  SARS CORONAVIRUS 2 (TAT 6-24 HRS)    EKG None  Radiology No results found.  Procedures Procedures (including critical care time)  Medications Ordered in ED Medications  sodium chloride 0.9 % bolus 1,000 mL (1,000 mLs Intravenous New Bag/Given 01/19/19 1845)     Initial Impression / Assessment and Plan / ED Course  I have reviewed the triage vital signs and the nursing notes.  Pertinent labs & imaging results that were available during my care of the patient were reviewed by me and considered in my medical decision making (see chart for details).        Patient is tachycardic and has lost 3 g of blood since last week.  His hemoglobin is around 10.  I spoke with Dr. Laural Golden and he stated the hospital should admit and GI will see the patient tomorrow  Final Clinical Impressions(s) / ED Diagnoses   Final diagnoses:  Lower GI bleed    ED Discharge Orders    None       Milton Ferguson, MD 01/19/19 1949

## 2019-01-20 ENCOUNTER — Encounter (HOSPITAL_COMMUNITY): Payer: Self-pay | Admitting: Gastroenterology

## 2019-01-20 ENCOUNTER — Observation Stay (HOSPITAL_COMMUNITY): Payer: PRIVATE HEALTH INSURANCE | Admitting: Anesthesiology

## 2019-01-20 ENCOUNTER — Encounter (HOSPITAL_COMMUNITY)
Admission: EM | Disposition: A | Discharge status: Home / Self Care | Payer: Self-pay | Source: Home / Self Care | Attending: Emergency Medicine

## 2019-01-20 ENCOUNTER — Other Ambulatory Visit: Payer: Self-pay

## 2019-01-20 DIAGNOSIS — K633 Ulcer of intestine: Secondary | ICD-10-CM | POA: Diagnosis not present

## 2019-01-20 DIAGNOSIS — R000 Tachycardia, unspecified: Secondary | ICD-10-CM | POA: Diagnosis present

## 2019-01-20 DIAGNOSIS — K635 Polyp of colon: Secondary | ICD-10-CM | POA: Diagnosis not present

## 2019-01-20 DIAGNOSIS — I10 Essential (primary) hypertension: Secondary | ICD-10-CM | POA: Diagnosis not present

## 2019-01-20 DIAGNOSIS — E782 Mixed hyperlipidemia: Secondary | ICD-10-CM

## 2019-01-20 DIAGNOSIS — K922 Gastrointestinal hemorrhage, unspecified: Secondary | ICD-10-CM | POA: Insufficient documentation

## 2019-01-20 DIAGNOSIS — N179 Acute kidney failure, unspecified: Secondary | ICD-10-CM

## 2019-01-20 DIAGNOSIS — D62 Acute posthemorrhagic anemia: Secondary | ICD-10-CM | POA: Diagnosis not present

## 2019-01-20 HISTORY — PX: COLONOSCOPY WITH PROPOFOL: SHX5780

## 2019-01-20 LAB — COMPREHENSIVE METABOLIC PANEL
ALT: 24 U/L (ref 0–44)
AST: 19 U/L (ref 15–41)
Albumin: 3.6 g/dL (ref 3.5–5.0)
Alkaline Phosphatase: 47 U/L (ref 38–126)
Anion gap: 11 (ref 5–15)
BUN: 15 mg/dL (ref 6–20)
CO2: 25 mmol/L (ref 22–32)
Calcium: 8.6 mg/dL — ABNORMAL LOW (ref 8.9–10.3)
Chloride: 101 mmol/L (ref 98–111)
Creatinine, Ser: 1.06 mg/dL (ref 0.61–1.24)
GFR calc Af Amer: >60 mL/min (ref >60)
GFR calc non Af Amer: >60 mL/min (ref >60)
Glucose, Bld: 117 mg/dL — ABNORMAL HIGH (ref 70–99)
Potassium: 3.7 mmol/L (ref 3.5–5.1)
Sodium: 137 mmol/L (ref 135–145)
Total Bilirubin: 0.5 mg/dL (ref 0.3–1.2)
Total Protein: 6 g/dL — ABNORMAL LOW (ref 6.5–8.1)

## 2019-01-20 LAB — CBC
HCT: 26.6 % — ABNORMAL LOW (ref 39.0–52.0)
HCT: 23.8 % — ABNORMAL LOW (ref 39.0–52.0)
Hemoglobin: 8.8 g/dL — ABNORMAL LOW (ref 13.0–17.0)
Hemoglobin: 7.8 g/dL — ABNORMAL LOW (ref 13.0–17.0)
MCH: 31.7 pg (ref 26.0–34.0)
MCH: 31.8 pg (ref 26.0–34.0)
MCHC: 33.1 g/dL (ref 30.0–36.0)
MCHC: 32.8 g/dL (ref 30.0–36.0)
MCV: 95.7 fL (ref 80.0–100.0)
MCV: 97.1 fL (ref 80.0–100.0)
Platelets: 193 K/uL (ref 150–400)
Platelets: ADEQUATE 10*3/uL (ref 150–400)
RBC: 2.78 MIL/uL — ABNORMAL LOW (ref 4.22–5.81)
RBC: 2.45 MIL/uL — ABNORMAL LOW (ref 4.22–5.81)
RDW: 12.4 % (ref 11.5–15.5)
RDW: 13 % (ref 11.5–15.5)
WBC: 7.7 K/uL (ref 4.0–10.5)
WBC: 7 10*3/uL (ref 4.0–10.5)
nRBC: 0 % (ref 0.0–0.2)
nRBC: 0 % (ref 0.0–0.2)

## 2019-01-20 LAB — SARS CORONAVIRUS 2 BY RT PCR (HOSPITAL ORDER, PERFORMED IN ~~LOC~~ HOSPITAL LAB): SARS Coronavirus 2: NEGATIVE

## 2019-01-20 SURGERY — COLONOSCOPY WITH PROPOFOL
Anesthesia: General

## 2019-01-20 MED ORDER — PEG 3350-KCL-NA BICARB-NACL 420 G PO SOLR
2000.0000 mL | Freq: Once | ORAL | Status: AC
  Administered 2019-01-20: 10:00:00 2000 mL via ORAL
  Filled 2019-01-20: qty 4000

## 2019-01-20 MED ORDER — PROPOFOL 10 MG/ML IV BOLUS
INTRAVENOUS | Status: DC | PRN
  Administered 2019-01-20: 10 mg via INTRAVENOUS
  Administered 2019-01-20: 20 mg via INTRAVENOUS
  Administered 2019-01-20: 10 mg via INTRAVENOUS

## 2019-01-20 MED ORDER — PROPOFOL 500 MG/50ML IV EMUL
INTRAVENOUS | Status: DC | PRN
  Administered 2019-01-20: 125 ug/kg/min via INTRAVENOUS

## 2019-01-20 MED ORDER — KETAMINE HCL 50 MG/5ML IJ SOSY
PREFILLED_SYRINGE | INTRAMUSCULAR | Status: AC
  Filled 2019-01-20: qty 5

## 2019-01-20 MED ORDER — PROPOFOL 10 MG/ML IV BOLUS
INTRAVENOUS | Status: AC
  Filled 2019-01-20: qty 20

## 2019-01-20 MED ORDER — SODIUM CHLORIDE 0.9 % IV SOLN: INTRAVENOUS | Status: DC

## 2019-01-20 MED ORDER — LACTATED RINGERS IV SOLN: Freq: Once | INTRAVENOUS | Status: DC

## 2019-01-20 MED ORDER — LIDOCAINE HCL (CARDIAC) PF 100 MG/5ML IV SOSY
PREFILLED_SYRINGE | INTRAVENOUS | Status: DC | PRN
  Administered 2019-01-20: 50 mg via INTRAVENOUS

## 2019-01-20 MED ORDER — KETAMINE HCL 10 MG/ML IJ SOLN
INTRAMUSCULAR | Status: DC | PRN
  Administered 2019-01-20 (×2): 10 mg via INTRAVENOUS

## 2019-01-20 NOTE — H&P (View-Only) (Signed)
Referring Provider: Dr. Roderic Palau Primary Care Physician:  Dettinger, Fransisca Kaufmann, MD Primary Gastroenterologist:  Dr. Gala Romney   Date of Admission: 01/19/2019 Date of Consultation: 01/20/2019  Reason for Consultation:  GI bleed  HPI:  Micheal Ross. is a 58 y.o. year old male presenting with worsening lower GI bleeding s/p colonoscopy one week ago with large cecal polyp removed via piecemeal fashion. 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 He was seen in the clinic last week with rectal bleeding starting day after colonoscopy that appeared to be low-volume. Hgb 12.9 last week (down from baseline of mid 13 range), and presented with another 2 gram drop in Hgb to 10.2 yesterday in ED. Hgb now 8.8. Received NS bolus yesterday. Currently pulse is in the low 100s, 90-100 while talking with patient. Earlier this morning while getting up to go to restroom, BP 88/62. Most recent 92/73.  Patient states he had persistent rectal bleeding that worsened over the weekend, with 6 episodes of burgundy stools yesterday. Associated weakness and dizziness. Mild abdominal cramping but no outright pain. Has noted new onset dyspnea on exertion since Wednesday. Since presentation to the ED, he reports 3 episodes of burgundy stools. Most recent one at 0630 that he describes as lesser amount than previous episodes. No fever or chills. Last food intake 1pm yesterday: lasagna. Denies any NSAIDs or aspirin. He normally is on 81 mg aspirin every other day but has not taken any since prior to the colonoscopy. Prescribed amoxicillin by PCP 11/11 for pharyngitis.   Prior complaints of neck pain, thigh weakness that were reported last week are now resolved.     Past Medical History:  Diagnosis Date  . 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 Hypercholesterolemia   . Hypertension     Past Surgical History:  Procedure Laterality Date  . ANKLE SURGERY    . brain tumor removal  2014  .  COLONOSCOPY WITH PROPOFOL  01/13/2019   20 mm polyp found in the cecum that was removed via piecemeal hot snare polypectomy. Repeat in 4 months. 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 COLONOSCOPY WITH PROPOFOL N/A 01/13/2019   Procedure: COLONOSCOPY WITH PROPOFOL;  Surgeon: Daneil Dolin, MD;  Location: [facility];  Service: Endoscopy;  Laterality: N/A;  1:15pm  . HERNIA REPAIR  [id number]   umbilical  . POLYPECTOMY  01/13/2019   Procedure: POLYPECTOMY;  Surgeon: Daneil Dolin, MD;  Location: [facility];  Service: Endoscopy;;  Large Cecal Polyp     Prior to Admission medications   Medication Sig Start Date End Date Taking? Authorizing Provider  amoxicillin (AMOXIL) 500 MG capsule Take 1 capsule (500 mg total) by mouth 2 (two) times daily. 01/15/19  Yes Dettinger, Fransisca Kaufmann, MD  lisinopril-hydrochlorothiazide (ZESTORETIC) 20-25 MG tablet Take 1 tablet by mouth daily. 12/19/18  Yes Dettinger, Fransisca Kaufmann, MD  lovastatin (MEVACOR) 20 MG tablet Take 1 tablet (20 mg total) by mouth every morning. 12/19/18  Yes Dettinger, Fransisca Kaufmann, MD  Multiple Vitamins-Minerals (ONE-A-DAY MENS 50+ ADVANTAGE) TABS Take 1 tablet by mouth daily.   Yes [provider]    Current Facility-Administered Medications  Medication Dose Route Frequency Provider Last Rate Last Dose  . acetaminophen (TYLENOL) tablet 650 mg  650 mg Oral Q6H PRN Oswald Hillock, MD       Or  . acetaminophen (TYLENOL) suppository 650 mg  650 mg Rectal Q6H PRN Oswald Hillock, MD      .  lactated ringers infusion   Intravenous Continuous Oswald Hillock, MD 100 mL/hr at 01/20/19 0640    . ondansetron (ZOFRAN) tablet 4 mg  4 mg Oral Q6H PRN Oswald Hillock, MD       Or  . ondansetron (ZOFRAN) injection 4 mg  4 mg Intravenous Q6H PRN Oswald Hillock, MD      . pravastatin (PRAVACHOL) tablet 20 mg  20 mg Oral q1800 Oswald Hillock, MD       Current Outpatient Medications  Medication Sig Dispense Refill  . amoxicillin  (AMOXIL) 500 MG capsule Take 1 capsule (500 mg total) by mouth 2 (two) times daily. 20 capsule 0  . lisinopril-hydrochlorothiazide (ZESTORETIC) 20-25 MG tablet Take 1 tablet by mouth daily. 90 tablet 3  . lovastatin (MEVACOR) 20 MG tablet Take 1 tablet (20 mg total) by mouth every morning. 90 tablet 3  . Multiple Vitamins-Minerals (ONE-A-DAY MENS 50+ ADVANTAGE) TABS Take 1 tablet by mouth daily.      Allergies as of 01/19/2019  . (No Known Allergies)    Family History  Problem Relation Age of Onset  . Thyroid disease Mother   . COPD Father   . Cancer Maternal Aunt   . Cancer Maternal Uncle        back  . Cancer Paternal Uncle        throat   . Cancer Cousin        prostate  . Colon cancer Neg Hx   . Colon polyps Neg Hx     Social History   Socioeconomic History  . Marital status: Married    Spouse name: Not on file  . Number of children: Not on file  . Years of education: Not on file  . Highest education level: Not on file  Occupational History  . Not on file  Social Needs  . Financial resource strain: Not on file  . Food insecurity    Worry: Not on file    Inability: Not on file  . Transportation needs    Medical: Not on file    Non-medical: Not on file  Tobacco Use  . Smoking status: Former Smoker    Packs/day: 0.50    Years: 2.00    Pack years: 1.00    Quit date: 03/30/1983    Years since quitting: 35.8  . Smokeless tobacco: Never Used  Substance and Sexual Activity  . Alcohol use: Yes    Comment: occ  . Drug use: No  . Sexual activity: Yes    Comment: married for 1985, 6 grown kids  Lifestyle  . Physical activity    Days per week: Not on file    Minutes per session: Not on file  . Stress: Not on file  Relationships  . Social Herbalist on phone: Not on file    Gets together: Not on file    Attends religious service: Not on file    Active member of club or organization: Not on file    Attends meetings of clubs or organizations: Not on  file    Relationship status: Not on file  . Intimate partner violence    Fear of current or ex partner: Not on file    Emotionally abused: Not on file    Physically abused: Not on file    Forced sexual activity: Not on file  Other Topics Concern  . Not on file  Social History Narrative  . Not on file  Review of Systems: Gen: see HPI CV: +dizziness Resp: +DOE GI: see HPI GU : Denies urinary burning, urinary frequency, urinary incontinence.  MS: Denies joint pain,swelling, cramping Derm: Denies rash, itching, dry skin Psych: Denies depression, anxiety,confusion, or memory loss Heme: see HPI  Physical Exam: Vital signs in last 24 hours: Temp:  [98.4 F (36.9 C)-98.5 F (36.9 C)] 98.5 F (36.9 C) (11/15 1839) Pulse Rate:  [90-132] 121 (11/16 0642) Resp:  [19-24] 24 (11/16 0642) BP: (88-138)/(62-82) 138/82 (11/16 0642) SpO2:  [95 %-100 %] 98 % (11/16 0642) Weight:  [147.4 kg] 147.4 kg (11/15 1741)   General:   Alert,  Well-developed, well-nourished, pleasant and cooperative in NAD Head:  Normocephalic and atraumatic. Eyes:  Sclera clear, no icterus.   Conjunctiva pink. Ears:  Normal auditory acuity. Lungs:  Clear throughout to auscultation.   Heart:  S1 S2 present without murmurs Abdomen:  Soft, nontender and nondistended. No masses, hepatosplenomegaly or hernias noted. Normal bowel sounds, without guarding, and without rebound.   Rectal:  Deferred  Msk:  Symmetrical without gross deformities. Normal posture. Extremities:  Without  edema. Neurologic:  Alert and  oriented x4 Skin:  Intact without significant lesions or rashes. Psych:  Alert and cooperative. Normal mood and affect.  Intake/Output from previous day: No intake/output data recorded. Intake/Output this shift: No intake/output data recorded.  Lab Results: Recent Labs    01/17/19 1131 01/19/19 1821 01/19/19 1837 01/20/19 0415  WBC 8.8 8.2  --  7.7  HGB 12.9* 10.2* 10.2* 8.8*  HCT 37.8* 30.5* 30.0*  26.6*  PLT 249 207  --  193   BMET Recent Labs    01/19/19 1821 01/19/19 1837 01/20/19 0415  NA 136 136 137  K 3.4* 4.0 3.7  CL 102 101 101  CO2 26  --  25  GLUCOSE 136* 128* 117*  BUN 18 19 15   CREATININE 1.45* 1.60* 1.06  CALCIUM 8.8*  --  8.6*   LFT Recent Labs    01/20/19 0415  PROT 6.0*  ALBUMIN 3.6  AST 19  ALT 24  ALKPHOS 47  BILITOT 0.5   PT/INR Recent Labs    01/19/19 1821  LABPROT 13.5  INR 1.0    Impression: Pleasant 58 year old male presenting with acute blood loss anemia due to post-polypectomy bleed, with colonoscopy a week ago with 20 mm cecal polyp removed in piecemeal fashion (tubulovillous adenoma). He had presented to the clinic last week but was hemodynamically stable with low-volume bleeding and Hgb stable: supportive measures recommended. Due to worsening bleeding and frequency, reporting 6 grossly blood stools yesterday, he presented to the ED. Now with Hgb 8.8 this morning, previously in 13 range as baseline. Hypotensive with BP 92/73, pulse 104. As of note, he denies any NSAIDs, and 81 mg aspirin has been on hold since prior to colonoscopy.   Discussed with patient likely need for therapeutic colonoscopy. His last meal was lasagna yesterday at 1pm. Will keep NPO. Discuss further with Dr. Oneida Alar.   Plan: Serial H/H Transfuse as needed Remain NPO Last COVID testing 11/6 negative in preparation for outpatient colonoscopy. Inpatient COVID testing remains pending: spoke with Nursing Supervisor who has ordered the in-house rapid testing in preparation for procedure today Colonoscopy with Propofol by Dr. Oneida Alar today. Risks and benefits discussed with patient who states understanding and agreeable to proceed.    Annitta Needs, PhD, ANP-BC [facility]     LOS: 0 days    01/20/2019, 8:47 AM

## 2019-01-20 NOTE — Interval H&P Note (Signed)
History and Physical Interval Note:  01/20/2019 3:09 PM  Micheal Ross.  has presented today for surgery, with the diagnosis of acute blood loss anemia, post-polypectomy bleed.  The various methods of treatment have been discussed with the patient and family. After consideration of risks, benefits and other options for treatment, the patient has consented to  Procedure(s): COLONOSCOPY WITH PROPOFOL (N/A) as a surgical intervention.  The patient's history has been reviewed, patient examined, no change in status, stable for surgery.  I have reviewed the patient's chart and labs.  Questions were answered to the patient's satisfaction.     [employer]

## 2019-01-20 NOTE — Telephone Encounter (Signed)
Looks like pt is currently admitted and is having tcs today. Micheal Ross consulted this morning.

## 2019-01-20 NOTE — Transfer of Care (Signed)
Immediate Anesthesia Transfer of Care Note  Patient: Micheal Ross.  Procedure(s) Performed: COLONOSCOPY WITH PROPOFOL (N/A )  Patient Location: PACU  Anesthesia Type:MAC  Level of Consciousness: awake, alert , oriented and patient cooperative  Airway & Oxygen Therapy: Patient Spontanous Breathing  Post-op Assessment: Report given to RN and Post -op Vital signs reviewed and stable  Post vital signs: Reviewed and stable  Last Vitals:  Vitals Value Taken Time  BP    Temp    Pulse 111 01/20/19 1616  Resp 21 01/20/19 1616  SpO2 98 % 01/20/19 1616  Vitals shown include unvalidated device data.  Last Pain:  Vitals:   01/20/19 1408  TempSrc:   PainSc: 0-No pain         Complications: No apparent anesthesia complications

## 2019-01-20 NOTE — Telephone Encounter (Signed)
FYI Noted. Called pt this morning to check on him this morning 01/20/2020, pt is currently in the hospital at [facility]. Pt stated he continued to have a lot of bleeding.

## 2019-01-20 NOTE — Anesthesia Preprocedure Evaluation (Addendum)
Anesthesia Evaluation  Patient identified by MRN, date of birth, ID band Patient awake    Reviewed: Allergy & Precautions, NPO status , Patient's Chart, lab work & pertinent test results  History of Anesthesia Complications Negative for: history of anesthetic complications  Airway Mallampati: II  TM Distance: >3 FB Neck ROM: Full    Dental  (+) Partial Upper, Dental Advisory Given   Pulmonary shortness of breath, former smoker,    Pulmonary exam normal breath sounds clear to auscultation       Cardiovascular Exercise Tolerance: Poor hypertension, Normal cardiovascular exam Rhythm:Regular Rate:Normal  19-Jan-2019 17:54:24 [place] [facility] ROUTINE RECORD Sinus tachycardia Ventricular premature complex Borderline left axis deviation Low voltage, precordial leads Abnormal R-wave progression, late transition Borderline T abnormalities, anterior leads No old tracing to compare Confirmed by Delora Fuel ([id number]) on 01/20/2019 1:59:08 AM   Neuro/Psych Craniotomy for meningioma - 2014 negative psych ROS   GI/Hepatic GERD  Medicated,  Endo/Other  Morbid obesity  Renal/GU      Musculoskeletal   Abdominal   Peds  Hematology   Anesthesia Other Findings   Reproductive/Obstetrics                          Anesthesia Physical Anesthesia Plan  ASA: III  Anesthesia Plan: General   Post-op Pain Management:    Induction: Intravenous  PONV Risk Score and Plan: TIVA  Airway Management Planned: Nasal Cannula, Natural Airway and Simple Face Mask  Additional Equipment:   Intra-op Plan:   Post-operative Plan:   Informed Consent: I have reviewed the patients History and Physical, chart, labs and discussed the procedure including the risks, benefits and alternatives for the proposed anesthesia with the patient or authorized representative who has indicated his/her understanding and  acceptance.     Dental advisory given  Plan Discussed with: CRNA  Anesthesia Plan Comments:       Anesthesia Quick Evaluation

## 2019-01-20 NOTE — Op Note (Addendum)
[facility] Patient Name: Micheal Ross Procedure Date: 01/20/2019 3:30 PM MRN: [id number] Date of Birth: 06-18-1960 Attending MD: Barney Drain MD, MD CSN: [id number] Age: 58 Admit Type: Inpatient Procedure:                Colonoscopy WITH CONTROL BLEEDING/COLD SNARE                            POLYPECTOMY Indications:              Hematochezia-HAD TCS NOV 6 AND WAS DIFFICULT TO                            REACH CECUM WITHOUT THE [place]. Providers:                Barney Drain MD, MD, Janeece Riggers, RN, Aram Candela Referring MD:             Fransisca Kaufmann Dettinger Medicines:                Propofol per Anesthesia Complications:            No immediate complications. Estimated Blood Loss:     Estimated blood loss was minimal. Procedure:                Pre-Anesthesia Assessment:                           - Prior to the procedure, a History and Physical                            was performed, and patient medications and                            allergies were reviewed. The patient's tolerance of                            previous anesthesia was also reviewed. The risks                            and benefits of the procedure and the sedation                            options and risks were discussed with the patient.                            All questions were answered, and informed consent                            was obtained. Prior Anticoagulants: The patient has                            taken no previous anticoagulant or antiplatelet                            agents. ASA Grade Assessment: II - A patient with  mild systemic disease. After reviewing the risks                            and benefits, the patient was deemed in                            satisfactory condition to undergo the procedure.                            After obtaining informed consent, the colonoscope                            was passed under direct vision. Throughout  the                            procedure, the patient's blood pressure, pulse, and                            oxygen saturations were monitored continuously. The                            [id number] scope was introduced through                            the anus and advanced to the the cecum, identified                            by appendiceal orifice and ileocecal valve. The                            colonoscopy was somewhat difficult due to a                            tortuous colon. Successful completion of the                            procedure was aided by straightening and shortening                            the scope to obtain bowel loop reduction and                            COLOWRAP. The patient tolerated the procedure well.                            The quality of the bowel preparation was excellent.                            The ileocecal valve, appendiceal orifice, and                            rectum were photographed. Scope In: 3:50:59 PM Scope Out: [id number] PM Scope Withdrawal Time: 0 hours 13 minutes 52 seconds  Total Procedure  Duration: 0 hours 16 minutes 13 seconds  Findings:      LARGE DEFECT(2 CM x 1.5 CM IDENTIFIED WITH SMALL VISIBLE VESSEL with       stigmata of recent bleeding were present in the cecum. To close a defect       after polypectomy, three hemostatic clips were successfully placed (MR       conditional) AT THE MARGINS OF THE DEFECT. THE DEFECT WAS TOO LARGE TO       BE CLOSED WITH CLIPS AND ATTEMPTED TO MAINTAIN HEMOSTASIS WITH HEMOSPRAY       APPLICATION. Estimated blood loss was minimal.      A 3 mm polyp was found in the cecum. The polyp was sessile. The polyp       was removed with a cold snare. Resection and retrieval were complete.      External and internal hemorrhoids were found.      The recto-sigmoid colon and sigmoid colon were mildly tortuous. Impression:               - RECTAL BLEEDING DUE TO LARGE DEFECT IN THE CECUM                             AFTER POLYPECTOMY .                           - One 3 mm polyp in the cecum, removed with a cold                            snare. Resected and retrieved.                           - External and internal hemorrhoids.                           - Tortuous colon. Moderate Sedation:      Per Anesthesia Care Recommendation:           - Return patient to hospital ward for ongoing care.                           - Cardiac diet.                           - Continue present medications.                           - Await pathology results. IF PT REBLEEDS HE WILL                            NEED TRANSFER FOR CT ANGIOGRAPHY/EMBOLIZATION.                           - Repeat colonoscopy date to be determined after                            pending pathology results are reviewed for  surveillance based on pathology results. Procedure Code(s):        --- Professional ---                           [phone], Colonoscopy, flexible; with removal of                            tumor(s), polyp(s), or other lesion(s) by snare                            technique Diagnosis Code(s):        --- Professional ---                           K63.3, Ulcer of intestine                           K63.5, Polyp of colon                           K64.8, Other hemorrhoids                           K92.1, Melena (includes Hematochezia)                           Q43.8, Other specified congenital malformations of                            intestine CPT copyright 2019 American Medical Association. All rights reserved. The codes documented in this report are preliminary and upon coder review may  be revised to meet current compliance requirements. Barney Drain, MD Barney Drain MD, MD 01/20/2019 4:22:51 PM This report has been signed electronically. Number of Addenda: 0

## 2019-01-20 NOTE — Progress Notes (Signed)
PROGRESS NOTE    Micheal Ross.  LK:[id number] DOB: [birth year] DOA: 01/19/2019 PCP: Dettinger, Fransisca Kaufmann, MD    Brief Narrative:  58 year old male who recently underwent colonoscopy on 11/9 and had a polyp removed, began having blood per rectum after his colonoscopy.  He was having 4-6 loose bowel movements every day.  When his bleeding persisted, he presented to the emergency for evaluation where he was noted to have acute blood loss anemia secondary to rectal bleeding.  He was admitted for further management.   Assessment & Plan:   Active Problems:   Hypertension   Hyperlipidemia   Rectal bleeding   Acute blood loss anemia   Tachycardia   AKI (acute kidney injury) ([place])   1. Rectal bleeding.  Most likely post polypectomy bleed.  GI consulted and plans on colonoscopy later today. 2. Acute blood loss anemia.  Has trended down from 12.9-8.8.  Repeat hemoglobin pending.  Transfuse for hemoglobin less than 7. 3. Acute kidney injury.  Likely related to anemia and volume depletion.  Improved with IV fluids. 4. Hypertension.  Continue to hold lisinopril/hydrochlorothiazide since blood pressures were soft on admission. 5. Hyperlipidemia.  Continue statin.   DVT prophylaxis: SCDs Code Status: Full code Family Communication: None present Disposition Plan: Discharge home tomorrow if hemoglobin is stable.   Consultants:   Gastroenterology  Procedures:     Antimicrobials:       Subjective: Continues to have dark stools.  Has had approximately 4 stools since arriving to the emergency room.  Feels lightheaded and dizzy on standing.  Objective: Vitals:   01/20/19 1615 01/20/19 1630 01/20/19 1703 01/20/19 1704  BP: 113/72 117/84 (!) 142/82   Pulse: 80 (!) 101 98   Resp: (!) 21 (!) 25 18   Temp: 98.8 F (37.1 C)   98.6 F (37 C)  TempSrc:      SpO2: 97% 99% 100%   Weight:      Height:        Intake/Output Summary (Last 24 hours) at 01/20/2019 2020 Last data  filed at 01/20/2019 1841 Gross per 24 hour  Intake 1140 ml  Output -  Net 1140 ml   Filed Weights   01/19/19 1741 01/20/19 1408  Weight: (!) 147.4 kg (!) 147.4 kg    Examination:  General exam: Appears calm and comfortable  Respiratory system: Clear to auscultation. Respiratory effort normal. Cardiovascular system: S1 & S2 heard, RRR. No JVD, murmurs, rubs, gallops or clicks. No pedal edema. Gastrointestinal system: Abdomen is nondistended, soft and nontender. No organomegaly or masses felt. Normal bowel sounds heard. Central nervous system: Alert and oriented. No focal neurological deficits. Extremities: Symmetric 5 x 5 power. Skin: No rashes, lesions or ulcers Psychiatry: Judgement and insight appear normal. Mood & affect appropriate.     Data Reviewed: I have personally reviewed following labs and imaging studies  CBC: Recent Labs  Lab 01/15/19 1021 01/17/19 1131 01/19/19 1821 01/19/19 1837 01/20/19 0415  WBC 6.6 8.8 8.2  --  7.7  NEUTROABS 4,521 6,442 5.7  --   --   HGB 13.4 12.9* 10.2* 10.2* 8.8*  HCT 39.3 37.8* 30.5* 30.0* 26.6*  MCV 93.1 93.6 96.2  --  95.7  PLT 213 249 207  --  0000000   Basic Metabolic Panel: Recent Labs  Lab 01/19/19 1821 01/19/19 1837 01/20/19 0415  NA 136 136 137  K 3.4* 4.0 3.7  CL 102 101 101  CO2 26  --  25  GLUCOSE 136* 128*  117*  BUN 18 19 15   CREATININE 1.45* 1.60* 1.06  CALCIUM 8.8*  --  8.6*   GFR: Estimated Creatinine Clearance: 109.1 mL/min (by C-G formula based on SCr of 1.06 mg/dL). Liver Function Tests: Recent Labs  Lab 01/20/19 0415  AST 19  ALT 24  ALKPHOS 47  BILITOT 0.5  PROT 6.0*  ALBUMIN 3.6   No results for input(s): LIPASE, AMYLASE in the last 168 hours. No results for input(s): AMMONIA in the last 168 hours. Coagulation Profile: Recent Labs  Lab 01/19/19 1821  INR 1.0   Cardiac Enzymes: No results for input(s): CKTOTAL, CKMB, CKMBINDEX, TROPONINI in the last 168 hours. BNP (last 3 results)  No results for input(s): PROBNP in the last 8760 hours. HbA1C: No results for input(s): HGBA1C in the last 72 hours. CBG: No results for input(s): GLUCAP in the last 168 hours. Lipid Profile: No results for input(s): CHOL, HDL, LDLCALC, TRIG, CHOLHDL, LDLDIRECT in the last 72 hours. Thyroid Function Tests: No results for input(s): TSH, T4TOTAL, FREET4, T3FREE, THYROIDAB in the last 72 hours. Anemia Panel: No results for input(s): VITAMINB12, FOLATE, FERRITIN, TIBC, IRON, RETICCTPCT in the last 72 hours. Sepsis Labs: No results for input(s): PROCALCITON, LATICACIDVEN in the last 168 hours.  Recent Results (from the past 240 hour(s))  SARS Coronavirus 2 by RT PCR (hospital order, performed in [facility] hospital lab) Nasopharyngeal Nasopharyngeal Swab     Status: None   Collection Time: 01/20/19  9:16 AM   Specimen: Nasopharyngeal Swab  Result Value Ref Range Status   SARS Coronavirus 2 NEGATIVE NEGATIVE Final    Comment: (NOTE) If result is NEGATIVE SARS-CoV-2 target nucleic acids are NOT DETECTED. The SARS-CoV-2 RNA is generally detectable in upper and lower  respiratory specimens during the acute phase of infection. The lowest  concentration of SARS-CoV-2 viral copies this assay can detect is 250  copies / mL. A negative result does not preclude SARS-CoV-2 infection  and should not be used as the sole basis for treatment or other  patient management decisions.  A negative result may occur with  improper specimen collection / handling, submission of specimen other  than nasopharyngeal swab, presence of viral mutation(s) within the  areas targeted by this assay, and inadequate number of viral copies  (<250 copies / mL). A negative result must be combined with clinical  observations, patient history, and epidemiological information. If result is POSITIVE SARS-CoV-2 target nucleic acids are DETECTED. The SARS-CoV-2 RNA is generally detectable in upper and lower  respiratory  specimens dur ing the acute phase of infection.  Positive  results are indicative of active infection with SARS-CoV-2.  Clinical  correlation with patient history and other diagnostic information is  necessary to determine patient infection status.  Positive results do  not rule out bacterial infection or co-infection with other viruses. If result is PRESUMPTIVE POSTIVE SARS-CoV-2 nucleic acids MAY BE PRESENT.   A presumptive positive result was obtained on the submitted specimen  and confirmed on repeat testing.  While 2019 novel coronavirus  (SARS-CoV-2) nucleic acids may be present in the submitted sample  additional confirmatory testing may be necessary for epidemiological  and / or clinical management purposes  to differentiate between  SARS-CoV-2 and other Sarbecovirus currently known to infect humans.  If clinically indicated additional testing with an alternate test  methodology [phone]) is advised. The SARS-CoV-2 RNA is generally  detectable in upper and lower respiratory sp ecimens during the acute  phase of infection. The expected  result is Negative. Fact Sheet for Patients:  StrictlyIdeas.no Fact Sheet for Healthcare Providers: BankingDealers.co.za This test is not yet approved or cleared by the [place] FDA and has been authorized for detection and/or diagnosis of SARS-CoV-2 by FDA under an Emergency Use Authorization (EUA).  This EUA will remain in effect (meaning this test can be used) for the duration of the COVID-19 declaration under Section 564(b)(1) of the Act, 21 U.S.C. section 360bbb-3(b)(1), unless the authorization is terminated or revoked sooner. Performed at [facility], [address], [place], [place] [address]          Radiology Studies: No results found.      Scheduled Meds: Continuous Infusions:   LOS: 0 days    Time spent: 20mins    Kathie Dike, MD [facility]   If  7PM-7AM, please contact night-coverage [email or web address]  01/20/2019, 8:20 PM

## 2019-01-20 NOTE — Telephone Encounter (Signed)
Noted  

## 2019-01-20 NOTE — ED Notes (Signed)
Pt stated he was light headed when getting up to go to restroom, BP when standing 88/62, MD made aware.

## 2019-01-20 NOTE — Consult Note (Addendum)
Referring Provider: Dr. Roderic Palau Primary Care Physician:  Dettinger, Fransisca Kaufmann, MD Primary Gastroenterologist:  Dr. Gala Romney   Date of Admission: 01/19/2019 Date of Consultation: 01/20/2019  Reason for Consultation:  GI bleed  HPI:  L…O Ridenhour. is a 58 y.o. year old male presenting with worsening lower GI bleeding s/p colonoscopy one week ago with large cecal polyp removed via piecemeal fashion. 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 He was seen in the clinic last week with rectal bleeding starting day after colonoscopy that appeared to be low-volume. Hgb 12.9 last week (down from baseline of mid 13 range), and presented with another 2 gram drop in Hgb to 10.2 yesterday in ED. Hgb now 8.8. Received NS bolus yesterday. Currently pulse is in the low 100s, 90-100 while talking with patient. Earlier this morning while getting up to go to restroom, BP 88/62. Most recent 92/73.  Patient states he had persistent rectal bleeding that worsened over the weekend, with 6 episodes of burgundy stools yesterday. Associated weakness and dizziness. Mild abdominal cramping but no outright pain. Has noted new onset dyspnea on exertion since Wednesday. Since presentation to the ED, he reports 3 episodes of burgundy stools. Most recent one at 0630 that he describes as lesser amount than previous episodes. No fever or chills. Last food intake 1pm yesterday: lasagna. Denies any NSAIDs or aspirin. He normally is on 81 mg aspirin every other day but has not taken any since prior to the colonoscopy. Prescribed amoxicillin by PCP 11/11 for pharyngitis.   Prior complaints of neck pain, thigh weakness that were reported last week are now resolved.     Past Medical History:  Diagnosis Date  . 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 Hypercholesterolemia   . Hypertension     Past Surgical History:  Procedure Laterality Date  . ANKLE SURGERY    . brain tumor removal  2014  .  COLONOSCOPY WITH PROPOFOL  01/13/2019   20 mm polyp found in the cecum that was removed via piecemeal hot snare polypectomy. Repeat in 4 months. 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 COLONOSCOPY WITH PROPOFOL N/A 01/13/2019   Procedure: COLONOSCOPY WITH PROPOFOL;  Surgeon: Daneil Dolin, MD;  Location: [facility];  Service: Endoscopy;  Laterality: N/A;  1:15pm  . HERNIA REPAIR  [id number]   umbilical  . POLYPECTOMY  01/13/2019   Procedure: POLYPECTOMY;  Surgeon: Daneil Dolin, MD;  Location: [facility];  Service: Endoscopy;;  Large Cecal Polyp     Prior to Admission medications   Medication Sig Start Date End Date Taking? Authorizing Provider  amoxicillin (AMOXIL) 500 MG capsule Take 1 capsule (500 mg total) by mouth 2 (two) times daily. 01/15/19  Yes Dettinger, Fransisca Kaufmann, MD  lisinopril-hydrochlorothiazide (ZESTORETIC) 20-25 MG tablet Take 1 tablet by mouth daily. 12/19/18  Yes Dettinger, Fransisca Kaufmann, MD  lovastatin (MEVACOR) 20 MG tablet Take 1 tablet (20 mg total) by mouth every morning. 12/19/18  Yes Dettinger, Fransisca Kaufmann, MD  Multiple Vitamins-Minerals (ONE-A-DAY MENS 50+ ADVANTAGE) TABS Take 1 tablet by mouth daily.   Yes [provider]    Current Facility-Administered Medications  Medication Dose Route Frequency Provider Last Rate Last Dose  . acetaminophen (TYLENOL) tablet 650 mg  650 mg Oral Q6H PRN Oswald Hillock, MD       Or  . acetaminophen (TYLENOL) suppository 650 mg  650 mg Rectal Q6H PRN Oswald Hillock, MD      .  lactated ringers infusion   Intravenous Continuous Oswald Hillock, MD 100 mL/hr at 01/20/19 0640    . ondansetron (ZOFRAN) tablet 4 mg  4 mg Oral Q6H PRN Oswald Hillock, MD       Or  . ondansetron (ZOFRAN) injection 4 mg  4 mg Intravenous Q6H PRN Oswald Hillock, MD      . pravastatin (PRAVACHOL) tablet 20 mg  20 mg Oral q1800 Oswald Hillock, MD       Current Outpatient Medications  Medication Sig Dispense Refill  . amoxicillin  (AMOXIL) 500 MG capsule Take 1 capsule (500 mg total) by mouth 2 (two) times daily. 20 capsule 0  . lisinopril-hydrochlorothiazide (ZESTORETIC) 20-25 MG tablet Take 1 tablet by mouth daily. 90 tablet 3  . lovastatin (MEVACOR) 20 MG tablet Take 1 tablet (20 mg total) by mouth every morning. 90 tablet 3  . Multiple Vitamins-Minerals (ONE-A-DAY MENS 50+ ADVANTAGE) TABS Take 1 tablet by mouth daily.      Allergies as of 01/19/2019  . (No Known Allergies)    Family History  Problem Relation Age of Onset  . Thyroid disease Mother   . COPD Father   . Cancer Maternal Aunt   . Cancer Maternal Uncle        back  . Cancer Paternal Uncle        throat   . Cancer Cousin        prostate  . Colon cancer Neg Hx   . Colon polyps Neg Hx     Social History   Socioeconomic History  . Marital status: Married    Spouse name: Not on file  . Number of children: Not on file  . Years of education: Not on file  . Highest education level: Not on file  Occupational History  . Not on file  Social Needs  . Financial resource strain: Not on file  . Food insecurity    Worry: Not on file    Inability: Not on file  . Transportation needs    Medical: Not on file    Non-medical: Not on file  Tobacco Use  . Smoking status: Former Smoker    Packs/day: 0.50    Years: 2.00    Pack years: 1.00    Quit date: 03/30/1983    Years since quitting: 35.8  . Smokeless tobacco: Never Used  Substance and Sexual Activity  . Alcohol use: Yes    Comment: occ  . Drug use: No  . Sexual activity: Yes    Comment: married for 1985, 6 grown kids  Lifestyle  . Physical activity    Days per week: Not on file    Minutes per session: Not on file  . Stress: Not on file  Relationships  . Social Herbalist on phone: Not on file    Gets together: Not on file    Attends religious service: Not on file    Active member of club or organization: Not on file    Attends meetings of clubs or organizations: Not on  file    Relationship status: Not on file  . Intimate partner violence    Fear of current or ex partner: Not on file    Emotionally abused: Not on file    Physically abused: Not on file    Forced sexual activity: Not on file  Other Topics Concern  . Not on file  Social History Narrative  . Not on file  Review of Systems: Gen: see HPI CV: +dizziness Resp: +DOE GI: see HPI GU : Denies urinary burning, urinary frequency, urinary incontinence.  MS: Denies joint pain,swelling, cramping Derm: Denies rash, itching, dry skin Psych: Denies depression, anxiety,confusion, or memory loss Heme: see HPI  Physical Exam: Vital signs in last 24 hours: Temp:  [98.4 F (36.9 C)-98.5 F (36.9 C)] 98.5 F (36.9 C) (11/15 1839) Pulse Rate:  [90-132] 121 (11/16 0642) Resp:  [19-24] 24 (11/16 0642) BP: (88-138)/(62-82) 138/82 (11/16 0642) SpO2:  [95 %-100 %] 98 % (11/16 0642) Weight:  [147.4 kg] 147.4 kg (11/15 1741)   General:   Alert,  Well-developed, well-nourished, pleasant and cooperative in NAD Head:  Normocephalic and atraumatic. Eyes:  Sclera clear, no icterus.   Conjunctiva pink. Ears:  Normal auditory acuity. Lungs:  Clear throughout to auscultation.   Heart:  S1 S2 present without murmurs Abdomen:  Soft, nontender and nondistended. No masses, hepatosplenomegaly or hernias noted. Normal bowel sounds, without guarding, and without rebound.   Rectal:  Deferred  Msk:  Symmetrical without gross deformities. Normal posture. Extremities:  Without  edema. Neurologic:  Alert and  oriented x4 Skin:  Intact without significant lesions or rashes. Psych:  Alert and cooperative. Normal mood and affect.  Intake/Output from previous day: No intake/output data recorded. Intake/Output this shift: No intake/output data recorded.  Lab Results: Recent Labs    01/17/19 1131 01/19/19 1821 01/19/19 1837 01/20/19 0415  WBC 8.8 8.2  --  7.7  HGB 12.9* 10.2* 10.2* 8.8*  HCT 37.8* 30.5* 30.0*  26.6*  PLT 249 207  --  193   BMET Recent Labs    01/19/19 1821 01/19/19 1837 01/20/19 0415  NA 136 136 137  K 3.4* 4.0 3.7  CL 102 101 101  CO2 26  --  25  GLUCOSE 136* 128* 117*  BUN 18 19 15   CREATININE 1.45* 1.60* 1.06  CALCIUM 8.8*  --  8.6*   LFT Recent Labs    01/20/19 0415  PROT 6.0*  ALBUMIN 3.6  AST 19  ALT 24  ALKPHOS 47  BILITOT 0.5   PT/INR Recent Labs    01/19/19 1821  LABPROT 13.5  INR 1.0    Impression: Pleasant 58 year old male presenting with acute blood loss anemia due to post-polypectomy bleed, with colonoscopy a week ago with 20 mm cecal polyp removed in piecemeal fashion (tubulovillous adenoma). He had presented to the clinic last week but was hemodynamically stable with low-volume bleeding and Hgb stable: supportive measures recommended. Due to worsening bleeding and frequency, reporting 6 grossly blood stools yesterday, he presented to the ED. Now with Hgb 8.8 this morning, previously in 13 range as baseline. Hypotensive with BP 92/73, pulse 104. As of note, he denies any NSAIDs, and 81 mg aspirin has been on hold since prior to colonoscopy.   Discussed with patient likely need for therapeutic colonoscopy. His last meal was lasagna yesterday at 1pm. Will keep NPO. Discuss further with Dr. Oneida Alar.   Plan: Serial H/H Transfuse as needed Remain NPO Last COVID testing 11/6 negative in preparation for outpatient colonoscopy. Inpatient COVID testing remains pending: spoke with Nursing Supervisor who has ordered the in-house rapid testing in preparation for procedure today Colonoscopy with Propofol by Dr. Oneida Alar today. Risks and benefits discussed with patient who states understanding and agreeable to proceed.    Annitta Needs, PhD, ANP-BC [facility]     LOS: 0 days    01/20/2019, 8:47 AM

## 2019-01-20 NOTE — Anesthesia Postprocedure Evaluation (Signed)
Anesthesia Post Note  Patient: Micheal Ross.  Procedure(s) Performed: COLONOSCOPY WITH PROPOFOL (N/A )  Patient location during evaluation: PACU Anesthesia Type: General Level of consciousness: awake and alert Pain management: pain level controlled Vital Signs Assessment: post-procedure vital signs reviewed and stable Respiratory status: spontaneous breathing Cardiovascular status: stable Postop Assessment: no apparent nausea or vomiting Anesthetic complications: no     Last Vitals:  Vitals:   01/20/19 1353 01/20/19 1408  BP: 133/76 133/87  Pulse: (!) 104 (!) 107  Resp: 20 20  Temp: 37 C   SpO2: 100% 99%    Last Pain:  Vitals:   01/20/19 1408  TempSrc:   PainSc: 0-No pain                 Everette Rank

## 2019-01-21 ENCOUNTER — Other Ambulatory Visit: Payer: Self-pay

## 2019-01-21 ENCOUNTER — Telehealth: Payer: Self-pay | Admitting: Gastroenterology

## 2019-01-21 DIAGNOSIS — K625 Hemorrhage of anus and rectum: Secondary | ICD-10-CM | POA: Diagnosis not present

## 2019-01-21 DIAGNOSIS — E782 Mixed hyperlipidemia: Secondary | ICD-10-CM | POA: Diagnosis not present

## 2019-01-21 DIAGNOSIS — D62 Acute posthemorrhagic anemia: Secondary | ICD-10-CM | POA: Diagnosis not present

## 2019-01-21 DIAGNOSIS — D649 Anemia, unspecified: Secondary | ICD-10-CM

## 2019-01-21 DIAGNOSIS — K922 Gastrointestinal hemorrhage, unspecified: Secondary | ICD-10-CM | POA: Diagnosis not present

## 2019-01-21 DIAGNOSIS — N179 Acute kidney failure, unspecified: Secondary | ICD-10-CM | POA: Diagnosis not present

## 2019-01-21 LAB — CBC
HCT: 23 % — ABNORMAL LOW (ref 39.0–52.0)
Hemoglobin: 7.8 g/dL — ABNORMAL LOW (ref 13.0–17.0)
MCH: 32.6 pg (ref 26.0–34.0)
MCHC: 33.9 g/dL (ref 30.0–36.0)
MCV: 96.2 fL (ref 80.0–100.0)
Platelets: 175 10*3/uL (ref 150–400)
RBC: 2.39 MIL/uL — ABNORMAL LOW (ref 4.22–5.81)
RDW: 13 % (ref 11.5–15.5)
WBC: 5.6 10*3/uL (ref 4.0–10.5)
nRBC: 0 % (ref 0.0–0.2)

## 2019-01-21 LAB — HIV ANTIBODY (ROUTINE TESTING W REFLEX): HIV Screen 4th Generation wRfx: NONREACTIVE — AB

## 2019-01-21 MED ORDER — ACETAMINOPHEN 325 MG PO TABS
650.0000 mg | ORAL_TABLET | Freq: Four times a day (QID) | ORAL | Status: DC | PRN
  Administered 2019-01-21: 02:00:00 650 mg via ORAL
  Filled 2019-01-21: qty 2

## 2019-01-21 NOTE — Telephone Encounter (Signed)
Noted  

## 2019-01-21 NOTE — Telephone Encounter (Signed)
Noted. Lab orders placed for 1 week. Will notify pt tomorrow 01/22/2019.

## 2019-01-21 NOTE — Progress Notes (Signed)
Patient states understanding of discharge instructions.  

## 2019-01-21 NOTE — Discharge Summary (Signed)
Physician Discharge Summary  Micheal Ross. [id number] DOB: Jul 03, 1960 DOA: 01/19/2019  PCP: Dettinger, Fransisca Kaufmann, MD  Admit date: 01/19/2019 Discharge date: 01/21/2019  Admitted From: Home Disposition: Home  Recommendations for Outpatient Follow-up:  1. Follow up with PCP in 1-2 weeks 2. Please obtain BMP/CBC in one week 3. Follow-up with GI as an outpatient   Discharge Condition: Stable CODE STATUS: Full code Diet recommendation: Heart healthy  Brief/Interim Summary: 58 year old male who recently underwent colonoscopy on 11/19 and had a polypectomy, began having blood per rectum after his colonoscopy.  He was having several loose bowel movements on a daily basis.  These began to turn into bloody bowel movements.  He presented emergently for evaluation where he was noted to have significant acute blood loss anemia due to rectal bleeding.  He had a significant decline in hemoglobin.  He was seen by gastroenterology and underwent colonoscopy.  It was noted that rectal bleeding was due to large defect in cecum after polypectomy.  Three hemostatic clips were placed to help control the bleeding.  He also received hemospray to help maintain hemostasis.  Since his procedure, has not had any further bleeding.  Hemoglobin appears to have stabilized at 7.8.  Patient will follow-up with GI as an outpatient.  The remainder of his medical problems remained stable.  Discharge Diagnoses:  Active Problems:   Hypertension   Hyperlipidemia   Rectal bleeding   Acute blood loss anemia   Tachycardia   AKI (acute kidney injury) [facility])    Discharge Instructions  Discharge Instructions    Diet - low sodium heart healthy   Complete by: As directed    Increase activity slowly   Complete by: As directed      Allergies as of 01/21/2019   No Known Allergies     Medication List    STOP taking these medications   amoxicillin 500 MG capsule Commonly known as: AMOXIL    lisinopril-hydrochlorothiazide 20-25 MG tablet Commonly known as: ZESTORETIC     TAKE these medications   lovastatin 20 MG tablet Commonly known as: MEVACOR Take 1 tablet (20 mg total) by mouth every morning.   One-A-Day Mens 50+ Advantage Tabs Take 1 tablet by mouth daily.      Follow-up Information    Fields, Marga Melnick, MD Follow up.   Specialty: Gastroenterology Why: office will call you with appointment Contact information: [address] [place] [address] [phone]          No Known Allergies  Consultations:  Gastroenterology   Procedures/Studies: US Carotid Duplex Bilateral  Result Date: 12/24/2018 CLINICAL DATA:  Retinal changes by ophthalmic exam, hypertension, hyperlipidemia, smoker EXAM: BILATERAL CAROTID DUPLEX ULTRASOUND TECHNIQUE: Pearline Cables scale imaging, color Doppler and duplex ultrasound were performed of bilateral carotid and vertebral arteries in the neck. COMPARISON:  None. FINDINGS: Criteria: Quantification of carotid stenosis is based on velocity parameters that correlate the residual internal carotid diameter with NASCET-based stenosis levels, using the diameter of the distal internal carotid lumen as the denominator for stenosis measurement. The following velocity measurements were obtained: RIGHT ICA: 86/15 cm/sec CCA: [id number] cm/sec SYSTOLIC ICA/CCA RATIO:  1.0 ECA: 112 cm/sec LEFT ICA: 46/13 cm/sec CCA: [id number] cm/sec SYSTOLIC ICA/CCA RATIO:  0.5 ECA: 71 cm/sec RIGHT CAROTID ARTERY: Minor echogenic shadowing plaque formation. No hemodynamically significant right ICA stenosis, velocity elevation, or turbulent flow. Degree of narrowing less than 50%. RIGHT VERTEBRAL ARTERY:  Antegrade LEFT CAROTID ARTERY: Similar scattered minor echogenic plaque formation. No hemodynamically significant left ICA  stenosis, velocity elevation, or turbulent flow. LEFT VERTEBRAL ARTERY:  Antegrade IMPRESSION: Minor carotid atherosclerosis. No hemodynamically significant ICA  stenosis by ultrasound. Degree of narrowing estimated less than 50% bilaterally. Patent antegrade vertebral flow bilaterally Electronically Signed   By: Jerilynn Mages.  Shick M.D.   On: 12/24/2018 11:39       Subjective: No further blood per rectum since yesterday.  Had a small bowel movement earlier today that was nonbloody.  He is not feeling dizzy or lightheaded.  Discharge Exam: Vitals:   01/20/19 1703 01/20/19 1704 01/20/19 2152 01/21/19 0632  BP: (!) 142/82  117/75 117/78  Pulse: 98  98 93  Resp: 18  20 20   Temp:  98.6 F (37 C) 99 F (37.2 C) 98.7 F (37.1 C)  TempSrc:   Oral Oral  SpO2: 100%  97% 94%  Weight:      Height:        General: Pt is alert, awake, not in acute distress Cardiovascular: RRR, S1/S2 +, no rubs, no gallops Respiratory: CTA bilaterally, no wheezing, no rhonchi Abdominal: Soft, NT, ND, bowel sounds + Extremities: no edema, no cyanosis    The results of significant diagnostics from this hospitalization (including imaging, microbiology, ancillary and laboratory) are listed below for reference.     Microbiology: Recent Results (from the past 240 hour(s))  SARS Coronavirus 2 by RT PCR (hospital order, performed in [facility] hospital lab) Nasopharyngeal Nasopharyngeal Swab     Status: None   Collection Time: 01/20/19  9:16 AM   Specimen: Nasopharyngeal Swab  Result Value Ref Range Status   SARS Coronavirus 2 NEGATIVE NEGATIVE Final    Comment: (NOTE) If result is NEGATIVE SARS-CoV-2 target nucleic acids are NOT DETECTED. The SARS-CoV-2 RNA is generally detectable in upper and lower  respiratory specimens during the acute phase of infection. The lowest  concentration of SARS-CoV-2 viral copies this assay can detect is 250  copies / mL. A negative result does not preclude SARS-CoV-2 infection  and should not be used as the sole basis for treatment or other  patient management decisions.  A negative result may occur with  improper specimen collection /  handling, submission of specimen other  than nasopharyngeal swab, presence of viral mutation(s) within the  areas targeted by this assay, and inadequate number of viral copies  (<250 copies / mL). A negative result must be combined with clinical  observations, patient history, and epidemiological information. If result is POSITIVE SARS-CoV-2 target nucleic acids are DETECTED. The SARS-CoV-2 RNA is generally detectable in upper and lower  respiratory specimens dur ing the acute phase of infection.  Positive  results are indicative of active infection with SARS-CoV-2.  Clinical  correlation with patient history and other diagnostic information is  necessary to determine patient infection status.  Positive results do  not rule out bacterial infection or co-infection with other viruses. If result is PRESUMPTIVE POSTIVE SARS-CoV-2 nucleic acids MAY BE PRESENT.   A presumptive positive result was obtained on the submitted specimen  and confirmed on repeat testing.  While 2019 novel coronavirus  (SARS-CoV-2) nucleic acids may be present in the submitted sample  additional confirmatory testing may be necessary for epidemiological  and / or clinical management purposes  to differentiate between  SARS-CoV-2 and other Sarbecovirus currently known to infect humans.  If clinically indicated additional testing with an alternate test  methodology [phone]) is advised. The SARS-CoV-2 RNA is generally  detectable in upper and lower respiratory sp ecimens during the acute  phase of infection. The expected result is Negative. Fact Sheet for Patients:  StrictlyIdeas.no Fact Sheet for Healthcare Providers: BankingDealers.co.za This test is not yet approved or cleared by the [place] FDA and has been authorized for detection and/or diagnosis of SARS-CoV-2 by FDA under an Emergency Use Authorization (EUA).  This EUA will remain in effect (meaning this  test can be used) for the duration of the COVID-19 declaration under Section 564(b)(1) of the Act, 21 U.S.C. section 360bbb-3(b)(1), unless the authorization is terminated or revoked sooner. Performed at [facility], [address] [place], [place] [address]      Labs: BNP (last 3 results) No results for input(s): BNP in the last 8760 hours. Basic Metabolic Panel: Recent Labs  Lab 01/19/19 1821 01/19/19 1837 01/20/19 0415  NA 136 136 137  K 3.4* 4.0 3.7  CL 102 101 101  CO2 26  --  25  GLUCOSE 136* 128* 117*  BUN 18 19 15   CREATININE 1.45* 1.60* 1.06  CALCIUM 8.8*  --  8.6*   Liver Function Tests: Recent Labs  Lab 01/20/19 0415  AST 19  ALT 24  ALKPHOS 47  BILITOT 0.5  PROT 6.0*  ALBUMIN 3.6   No results for input(s): LIPASE, AMYLASE in the last 168 hours. No results for input(s): AMMONIA in the last 168 hours. CBC: Recent Labs  Lab 01/15/19 1021 01/17/19 1131 01/19/19 1821 01/19/19 1837 01/20/19 0415 01/20/19 2220 01/21/19 0602  WBC 6.6 8.8 8.2  --  7.7 7.0 5.6  NEUTROABS 4,521 6,442 5.7  --   --   --   --   HGB 13.4 12.9* 10.2* 10.2* 8.8* 7.8* 7.8*  HCT 39.3 37.8* 30.5* 30.0* 26.6* 23.8* 23.0*  MCV 93.1 93.6 96.2  --  95.7 97.1 96.2  PLT 213 249 207  --  193 PLATELET CLUMPS NOTED ON SMEAR, COUNT APPEARS ADEQUATE 175   Cardiac Enzymes: No results for input(s): CKTOTAL, CKMB, CKMBINDEX, TROPONINI in the last 168 hours. BNP: Invalid input(s): POCBNP CBG: No results for input(s): GLUCAP in the last 168 hours. D-Dimer No results for input(s): DDIMER in the last 72 hours. Hgb A1c No results for input(s): HGBA1C in the last 72 hours. Lipid Profile No results for input(s): CHOL, HDL, LDLCALC, TRIG, CHOLHDL, LDLDIRECT in the last 72 hours. Thyroid function studies No results for input(s): TSH, T4TOTAL, T3FREE, THYROIDAB in the last 72 hours.  Invalid input(s): FREET3 Anemia work up No results for input(s): VITAMINB12, FOLATE, FERRITIN, TIBC, IRON,  RETICCTPCT in the last 72 hours. Urinalysis No results found for: COLORURINE, APPEARANCEUR, [place], [place], [place], [place], [place], [place], PROTEINUR, UROBILINOGEN, NITRITE, LEUKOCYTESUR Sepsis Labs Invalid input(s): PROCALCITONIN,  WBC,  LACTICIDVEN Microbiology Recent Results (from the past 240 hour(s))  SARS Coronavirus 2 by RT PCR (hospital order, performed in [facility] hospital lab) Nasopharyngeal Nasopharyngeal Swab     Status: None   Collection Time: 01/20/19  9:16 AM   Specimen: Nasopharyngeal Swab  Result Value Ref Range Status   SARS Coronavirus 2 NEGATIVE NEGATIVE Final    Comment: (NOTE) If result is NEGATIVE SARS-CoV-2 target nucleic acids are NOT DETECTED. The SARS-CoV-2 RNA is generally detectable in upper and lower  respiratory specimens during the acute phase of infection. The lowest  concentration of SARS-CoV-2 viral copies this assay can detect is 250  copies / mL. A negative result does not preclude SARS-CoV-2 infection  and should not be used as the sole basis for treatment or other  patient management decisions.  A negative result may occur with  improper specimen collection / handling, submission of specimen other  than nasopharyngeal swab, presence of viral mutation(s) within the  areas targeted by this assay, and inadequate number of viral copies  (<250 copies / mL). A negative result must be combined with clinical  observations, patient history, and epidemiological information. If result is POSITIVE SARS-CoV-2 target nucleic acids are DETECTED. The SARS-CoV-2 RNA is generally detectable in upper and lower  respiratory specimens dur ing the acute phase of infection.  Positive  results are indicative of active infection with SARS-CoV-2.  Clinical  correlation with patient history and other diagnostic information is  necessary to determine patient infection status.  Positive results do  not rule out bacterial infection or co-infection with other  viruses. If result is PRESUMPTIVE POSTIVE SARS-CoV-2 nucleic acids MAY BE PRESENT.   A presumptive positive result was obtained on the submitted specimen  and confirmed on repeat testing.  While 2019 novel coronavirus  (SARS-CoV-2) nucleic acids may be present in the submitted sample  additional confirmatory testing may be necessary for epidemiological  and / or clinical management purposes  to differentiate between  SARS-CoV-2 and other Sarbecovirus currently known to infect humans.  If clinically indicated additional testing with an alternate test  methodology [phone]) is advised. The SARS-CoV-2 RNA is generally  detectable in upper and lower respiratory sp ecimens during the acute  phase of infection. The expected result is Negative. Fact Sheet for Patients:  StrictlyIdeas.no Fact Sheet for Healthcare Providers: BankingDealers.co.za This test is not yet approved or cleared by the [place] FDA and has been authorized for detection and/or diagnosis of SARS-CoV-2 by FDA under an Emergency Use Authorization (EUA).  This EUA will remain in effect (meaning this test can be used) for the duration of the COVID-19 declaration under Section 564(b)(1) of the Act, 21 U.S.C. section 360bbb-3(b)(1), unless the authorization is terminated or revoked sooner. Performed at [facility], [address], [place], [place] [address]      Time coordinating discharge: 34mins  SIGNED:   Kathie Dike, MD  [facility] 01/21/2019, 10:29 PM   If 7PM-7AM, please contact night-coverage [email or web address]

## 2019-01-21 NOTE — Telephone Encounter (Signed)
Patient has been discharged from hospital today for acute blood loss anemia due to post-polypectomy bleed. He needs repeat CBC in 1 week. Would like to see him in the office for follow-up in 4 weeks. He should let us know if he has return of rectal bleeding.   Micheal Ross, can you place orders?   [name], can you schedule patient for follow-up?

## 2019-01-21 NOTE — Progress Notes (Signed)
Subjective: Denies any brbpr since hs colonoscopy. Has only passed a small amount of stool per rectum since procedure which was yellow in color. No abdominal pain. No nausea or vomiting. Tolerating diet fine.    Objective: Vital signs in last 24 hours: Temp:  [98.6 F (37 C)-99 F (37.2 C)] 98.7 F (37.1 C) (11/17 [id number]) Pulse Rate:  [80-107] 93 (11/17 0632) Resp:  [16-25] 20 (11/17 [id number]) BP: (92-142)/(72-93) 117/78 (11/17 [id number]) SpO2:  [94 %-100 %] 94 % (11/17 [id number]) Weight:  [147.4 kg] 147.4 kg (11/16 1408) Last BM Date: 01/20/19 General:   Alert and oriented, pleasant Head:  Normocephalic and atraumatic. Eyes:  No icterus, sclera clear. Conjuctiva pink.  Abdomen:  Bowel sounds present, soft, non-tender, non-distended. No HSM or hernias noted. No rebound or guarding. No masses appreciated  Msk:  Symmetrical without gross deformities. Normal posture. Extremities:  Without edema. Neurologic:  Alert and  oriented x4;  grossly normal neurologically. Skin:  Warm and dry, intact without significant lesions.  Psych: Normal mood and affect.  Intake/Output from previous day: 11/16 0701 - 11/17 0700 In: 1140 [P.O.:240; I.V.:900] Out: 400 [Urine:400] Intake/Output this shift: No intake/output data recorded.  Lab Results: Recent Labs    01/20/19 0415 01/20/19 2220 01/21/19 0602  WBC 7.7 7.0 5.6  HGB 8.8* 7.8* 7.8*  HCT 26.6* 23.8* 23.0*  PLT 193 PLATELET CLUMPS NOTED ON SMEAR, COUNT APPEARS ADEQUATE 175   BMET Recent Labs    01/19/19 1821 01/19/19 1837 01/20/19 0415  NA 136 136 137  K 3.4* 4.0 3.7  CL 102 101 101  CO2 26  --  25  GLUCOSE 136* 128* 117*  BUN 18 19 15   CREATININE 1.45* 1.60* 1.06  CALCIUM 8.8*  --  8.6*   LFT Recent Labs    01/20/19 0415  PROT 6.0*  ALBUMIN 3.6  AST 19  ALT 24  ALKPHOS 47  BILITOT 0.5   PT/INR Recent Labs    01/19/19 1821  LABPROT 13.5  INR 1.0   Assessment: 58 year old male admitted with acute blood loss anemia due  to post polypectomy bleed.  Colonoscopy 1 week ago with 20 mm cecal polyp removed in piecemeal fashion.  Pathology with tubulovillous adenoma.  He presented to the clinic last week for low-volume hematochezia and was hemodynamically stable with stable hemoglobin.  Supportive measures were recommended at that time.  Due to worsening bleeding, he presented to the emergency department on 01/19/2019.  Hemoglobin 10.2 on admission, trended down to 8.8 on 11/16.  Baseline hemoglobin around 13 range. His aspirin has been on hold since his procedure and he denies any NSAIDs.  He underwent colonoscopy on 11/16 revealing a large defect with small visible vessel with stigmata of recent bleeding in the cecum.  3 hemostatic clips were placed to close the defect.  Defect was too large to be closed with clips and attempted to maintain hemostasis with hemospray.  A 3 mm polyp was also found in the cecum and resected.  It was recommended that if patient rebleeds, he will need transfer for CT angiography/embolization.   Hemoglobin 7.8 this morning which is stable from yesterday evening. 1 g drop compared to Monday morning at 4 am; however, patient notes he did have a few episodes of rectal bleeding early yesterday prior to his procedure; he also continued to receive IV fluids yesterday. No further bleeding noted since his colonoscopy.   Plan: Continue to monitor H/H and for any overt GI bleeding today. Patient  was advised to let nursing staff know if he passes any blood per rectum.  Continue to avoid NASIDs including aspirin for now.  If patient rebleeds, he will need transfer for CT angiography/embolization.  Repeat colonoscopy date to be determined after pending pathology results are reviewed.   LOS: 0 days    01/21/2019, 7:57 AM   Aliene Altes, PA-C [facility] 2 Gastroenterology

## 2019-01-22 LAB — SURGICAL PATHOLOGY

## 2019-01-23 ENCOUNTER — Encounter (HOSPITAL_COMMUNITY): Payer: Self-pay | Admitting: Gastroenterology

## 2019-01-23 ENCOUNTER — Encounter: Payer: Self-pay | Admitting: Internal Medicine

## 2019-01-23 NOTE — Telephone Encounter (Signed)
PATIENT SCHEDULED  °

## 2019-01-24 NOTE — Telephone Encounter (Signed)
Spoke with pt. He will have his labs drawn 01/28/2019 @ [place] lab as directed per [facility].

## 2019-01-26 ENCOUNTER — Telehealth: Payer: Self-pay | Admitting: Family Medicine

## 2019-01-26 NOTE — Telephone Encounter (Signed)
Patient called with concerns about his blood pressure.  He reports in 1 week he had to have 2 colonoscopies.  On 01/13/2019 he had a colonoscopy and reports they removed a very large polyp.  He continued to have bleeding for the next 5 days at which time they went back in and and some staples to stop the bleeding per his report.  He was taken off of his blood pressure medicine due to low readings, he gives the example of 107/76.  He has been keeping track of his blood pressure since that time and has gotten the following readings: 134/87, 122/74, 129/91, 129/95, and then this morning 159/96.  He reports he did have a headache this morning with that elevated BP.  He is going to have labs done next week with [facility] that his gastroenterologist is going to follow-up on.  He was advised to have a follow-up with his PCP in 1 to 2 weeks.  He has an appointment scheduled with PCP, Dr. Warrick Parisian, on 03/31/2019.  I have advised him to continue keeping a log of his blood pressure readings once daily.  Since he has only had one elevated reading I did not restart his blood pressure medication today.  Also encouraged him to call the office tomorrow and let them know that he needs a follow-up with his PCP, or another provider if PCP does not have anything.  Advised that he needs to bring his blood pressure log when he comes to that appointment.  Encouraged to call back if he finds that his blood pressure readings are consistently greater than 150/90.

## 2019-01-27 ENCOUNTER — Telehealth: Payer: Self-pay

## 2019-01-27 ENCOUNTER — Telehealth: Payer: Self-pay | Admitting: Family Medicine

## 2019-01-27 NOTE — Telephone Encounter (Signed)
Appointment scheduled for hospital follow up via televisit on 02/05/2019 with Dr. Warrick Parisian.  Patient aware.

## 2019-01-27 NOTE — Telephone Encounter (Signed)
Patient was recently hospitalized with a GI bleed.  While he was in the hospital he was taken off of his Lisinopril/HCT 20/25 because of hypotension caused by the GI bleed.  Since coming home his blood pressures have been running around 150-160/95-98.  He has a hospital follow up appointment with you on 02/05/19 but wanted to ask if he should go ahead and restart his Lisinopril until he is seen by you on that date.

## 2019-01-27 NOTE — Telephone Encounter (Signed)
Patient aware and verbalizes understanding. 

## 2019-01-27 NOTE — Telephone Encounter (Signed)
Have him go ahead and restart by taking a half a tablet and seeing how that goes and then let me know how the blood pressures are running.

## 2019-01-28 LAB — CBC WITH DIFFERENTIAL/PLATELET
Absolute Monocytes: 464 cells/uL (ref 200–950)
Basophils Absolute: 22 cells/uL (ref 0–200)
Basophils Relative: 0.4 %
Eosinophils Absolute: 119 cells/uL (ref 15–500)
Eosinophils Relative: 2.2 %
HCT: 28.5 % — ABNORMAL LOW (ref 38.5–50.0)
Hemoglobin: 9.6 g/dL — ABNORMAL LOW (ref 13.2–17.1)
Lymphs Abs: 1442 cells/uL (ref 850–3900)
MCH: 32 pg (ref 27.0–33.0)
MCHC: 33.7 g/dL (ref 32.0–36.0)
MCV: 95 fL (ref 80.0–100.0)
MPV: 11.9 fL (ref 7.5–12.5)
Monocytes Relative: 8.6 %
Neutro Abs: 3353 cells/uL (ref 1500–7800)
Neutrophils Relative %: 62.1 %
Platelets: 264 10*3/uL (ref 140–400)
RBC: 3 10*6/uL — ABNORMAL LOW (ref 4.20–5.80)
RDW: 14.1 % (ref 11.0–15.0)
Total Lymphocyte: 26.7 %
WBC: 5.4 10*3/uL (ref 3.8–10.8)

## 2019-01-29 ENCOUNTER — Telehealth: Payer: Self-pay | Admitting: Family Medicine

## 2019-01-29 NOTE — Telephone Encounter (Signed)
Dr Dettinger - sending note with BP #s  C. Leafy Ro - send for you to check on referral = placed 01/15/19.

## 2019-01-29 NOTE — Telephone Encounter (Signed)
BPs do sound a lot better, continue with current course

## 2019-02-03 NOTE — Telephone Encounter (Signed)
LM with [facility] to see where they are with scheduling Patient.

## 2019-02-05 ENCOUNTER — Encounter: Payer: Self-pay | Admitting: Family Medicine

## 2019-02-05 ENCOUNTER — Ambulatory Visit (INDEPENDENT_AMBULATORY_CARE_PROVIDER_SITE_OTHER): Payer: PRIVATE HEALTH INSURANCE | Admitting: Family Medicine

## 2019-02-05 DIAGNOSIS — I10 Essential (primary) hypertension: Secondary | ICD-10-CM | POA: Diagnosis not present

## 2019-02-05 DIAGNOSIS — D62 Acute posthemorrhagic anemia: Secondary | ICD-10-CM

## 2019-02-05 DIAGNOSIS — R42 Dizziness and giddiness: Secondary | ICD-10-CM

## 2019-02-05 NOTE — Progress Notes (Signed)
Virtual Visit via telephone Note  I connected with Micheal Ross. on 02/05/19 at 1517 by telephone and verified that I am speaking with the correct person using two identifiers. Micheal Ross. is currently located at home and no other people are currently with her during visit. The provider, Fransisca Kaufmann Dettinger, MD is located in their office at time of visit.  Call ended at 1527  I discussed the limitations, risks, security and privacy concerns of performing an evaluation and management service by telephone and the availability of in person appointments. I also discussed with the patient that there may be a patient responsible charge related to this service. The patient expressed understanding and agreed to proceed.   History and Present Illness: Patient was having rectal bleeding and was feeling lightheaded and dizzy.  He had 01/19/2019-01/21/2019. His bp is fluctuating up and down and is 120-140/ 70-95. He is still tired and blood counts that are still down.  He is not having any more bleeding.  His cramping and abdominal pain.   Hypertension Patient is currently on lisinopril-hctz, 20/25 taking 1/2 tab, and their blood pressure today is 145/89. Patient denies any lightheadedness or dizziness. Patient denies headaches, blurred vision, chest pains, shortness of breath, or weakness. Denies any side effects from medication and is content with current medication.   No diagnosis found.  Outpatient Encounter Medications as of 02/05/2019  Medication Sig  . lovastatin (MEVACOR) 20 MG tablet Take 1 tablet (20 mg total) by mouth every morning.  . Multiple Vitamins-Minerals (ONE-A-DAY MENS 50+ ADVANTAGE) TABS Take 1 tablet by mouth daily.   No facility-administered encounter medications on file as of 02/05/2019.     Review of Systems  Constitutional: Positive for fatigue. Negative for chills and fever.  Respiratory: Negative for shortness of breath and wheezing.   Cardiovascular:  Negative for chest pain and leg swelling.  Gastrointestinal: Negative for abdominal pain, blood in stool, nausea and vomiting.  Musculoskeletal: Negative for back pain and gait problem.  Skin: Negative for rash.  Neurological: Positive for weakness. Negative for dizziness, speech difficulty, light-headedness and numbness.  All other systems reviewed and are negative.   Observations/Objective: Patient sounds comfortable and in no acute distress  Assessment and Plan: Problem List Items Addressed This Visit      Cardiovascular and Mediastinum   Hypertension   Relevant Medications   lisinopril-hydrochlorothiazide (ZESTORETIC) 20-25 MG tablet   Other Relevant Orders   CBC with Differential/Platelet   CMP14+EGFR     Other   Acute blood loss anemia - Primary   Relevant Orders   Ambulatory referral to Gastroenterology    Other Visit Diagnoses    Lightheadedness       Relevant Orders   CBC with Differential/Platelet   CMP14+EGFR       Follow Up Instructions:  Follow up with mychart bp in 1 week, if bps start to run up then will resume whole tablet.    Patient wants new gastroenterologist, no further bleeding, check cbc and chem in 1 week.   I discussed the assessment and treatment plan with the patient. The patient was provided an opportunity to ask questions and all were answered. The patient agreed with the plan and demonstrated an understanding of the instructions.   The patient was advised to call back or seek an in-person evaluation if the symptoms worsen or if the condition fails to improve as anticipated.  The above assessment and management plan was discussed with the patient. The patient  verbalized understanding of and has agreed to the management plan. Patient is aware to call the clinic if symptoms persist or worsen. Patient is aware when to return to the clinic for a follow-up visit. Patient educated on when it is appropriate to go to the emergency department.    I  provided 10 minutes of non-face-to-face time during this encounter.    [name], MD

## 2019-02-10 ENCOUNTER — Other Ambulatory Visit: Payer: Self-pay

## 2019-02-10 ENCOUNTER — Other Ambulatory Visit: Payer: PRIVATE HEALTH INSURANCE

## 2019-02-10 DIAGNOSIS — R42 Dizziness and giddiness: Secondary | ICD-10-CM

## 2019-02-10 DIAGNOSIS — I10 Essential (primary) hypertension: Secondary | ICD-10-CM

## 2019-02-11 LAB — CBC WITH DIFFERENTIAL/PLATELET
Basophils Absolute: 0 10*3/uL (ref 0.0–0.2)
Basos: 1 %
EOS (ABSOLUTE): 0.1 10*3/uL (ref 0.0–0.4)
Eos: 2 %
Hematocrit: 34.1 % — ABNORMAL LOW (ref 37.5–51.0)
Hemoglobin: 11.4 g/dL — ABNORMAL LOW (ref 13.0–17.7)
Immature Grans (Abs): 0 10*3/uL (ref 0.0–0.1)
Immature Granulocytes: 0 %
Lymphocytes Absolute: 1.3 10*3/uL (ref 0.7–3.1)
Lymphs: 23 %
MCH: 31.7 pg (ref 26.6–33.0)
MCHC: 33.4 g/dL (ref 31.5–35.7)
MCV: 95 fL (ref 79–97)
Monocytes Absolute: 0.4 10*3/uL (ref 0.1–0.9)
Monocytes: 8 %
Neutrophils Absolute: 3.7 10*3/uL (ref 1.4–7.0)
Neutrophils: 66 %
Platelets: 264 10*3/uL (ref 150–450)
RBC: 3.6 x10E6/uL — ABNORMAL LOW (ref 4.14–5.80)
RDW: 13.7 % (ref 11.6–15.4)
WBC: 5.6 10*3/uL (ref 3.4–10.8)

## 2019-02-11 LAB — CMP14+EGFR
ALT: 22 IU/L (ref 0–44)
AST: 19 IU/L (ref 0–40)
Albumin/Globulin Ratio: 2.1 (ref 1.2–2.2)
Albumin: 4.6 g/dL (ref 3.8–4.9)
Alkaline Phosphatase: 72 IU/L (ref 39–117)
BUN/Creatinine Ratio: 11 (ref 9–20)
BUN: 13 mg/dL (ref 6–24)
Bilirubin Total: 0.3 mg/dL (ref 0.0–1.2)
CO2: 25 mmol/L (ref 20–29)
Calcium: 9.7 mg/dL (ref 8.7–10.2)
Chloride: 103 mmol/L (ref 96–106)
Creatinine, Ser: 1.2 mg/dL (ref 0.76–1.27)
GFR calc Af Amer: 76 mL/min/{1.73_m2} (ref >59)
GFR calc non Af Amer: 66 mL/min/{1.73_m2} (ref >59)
Globulin, Total: 2.2 g/dL (ref 1.5–4.5)
Glucose: 101 mg/dL — ABNORMAL HIGH (ref 65–99)
Potassium: 4.3 mmol/L (ref 3.5–5.2)
Sodium: 142 mmol/L (ref 134–144)
Total Protein: 6.8 g/dL (ref 6.0–8.5)

## 2019-02-17 ENCOUNTER — Telehealth: Payer: Self-pay | Admitting: Family Medicine

## 2019-02-17 NOTE — Telephone Encounter (Signed)
Patients bp is running around 138/99. This is the last one he took this morning instead of taking a half of his lisinopril should he go back to a whole pill. Please advise so we can let patient know.

## 2019-02-17 NOTE — Telephone Encounter (Signed)
Yes go ahead and have him go back to the whole pill and keep an eye on it.

## 2019-02-17 NOTE — Telephone Encounter (Signed)
Patient aware and verbalized understanding. °

## 2019-02-17 NOTE — Telephone Encounter (Signed)
Patient would like call back regarding his medications. Says he was taken off of his BP meds.

## 2019-02-19 NOTE — Telephone Encounter (Signed)
Spoke with Forestine Na - His Sleep Study is pending authorization

## 2019-02-19 NOTE — Progress Notes (Deleted)
Referring Provider: Dettinger, Fransisca Kaufmann, MD Primary Care Physician:  Dettinger, Fransisca Kaufmann, MD Primary GI Physician: Dr. Rayne Du chief complaint on file.   HPI:   Micheal Ross. is a 58 y.o. male presenting today for hospital follow-up.  He was admitted from 01/19/2019-01/21/2019 for acute blood loss anemia secondary to post polypectomy bleed.  Original colonoscopy 01/13/2019 with one 20 mm polyp in the cecum removed with hot snare, resection incomplete.  Pathology revealed tubulovillous adenoma.  Was recommended to have repeat colonoscopy in 4 months.  Unfortunately, patient had persistent rectal bleeding after polypectomy.  Repeat colonoscopy on 01/20/2019 with a large defect (2 cm x 1.5 cm with small visible vessel with stigmata of recent bleeding) in the cecum.  Defect closed using 3 hemostatic clips, MR conditional, at the margins of the defect.  Defect was too large to be closed with clips and attempted to maintain hemostasis with him with spray application.  One 3 mm polyp in the cecum also removed.  Other findings included external and internal hemorrhoids, tortuous colon.  Pathology with tubular adenoma.  Hemoglobin at discharge was 7.8.  Most recent hemoglobin 11.4 on 02/10/2019.    Past Medical History:  Diagnosis Date  . 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 Hypercholesterolemia   . Hypertension   . Sleep apnea    set to get tested soon 01/20/19    Past Surgical History:  Procedure Laterality Date  . ANKLE SURGERY    . brain tumor removal  2014  . COLONOSCOPY WITH PROPOFOL  01/13/2019   20 mm polyp found in the cecum that was removed via piecemeal hot snare polypectomy. Repeat in 4 months. 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 COLONOSCOPY WITH PROPOFOL N/A 01/13/2019   Procedure: COLONOSCOPY WITH PROPOFOL;  Surgeon: Daneil Dolin, MD;  [facility];  Service: Endoscopy;  Laterality: N/A;  1:15pm  . COLONOSCOPY WITH  PROPOFOL N/A 01/20/2019   Procedure: COLONOSCOPY WITH PROPOFOL;  Surgeon: Danie Binder, MD;  Location: [facility];  Service: Endoscopy;  Laterality: N/A;  . HERNIA REPAIR  [id number]   umbilical  . POLYPECTOMY  01/13/2019   Procedure: POLYPECTOMY;  Surgeon: Daneil Dolin, MD;  Location: [facility];  Service: Endoscopy;;  Large Cecal Polyp     Current Outpatient Medications  Medication Sig Dispense Refill  . lisinopril-hydrochlorothiazide (ZESTORETIC) 20-25 MG tablet Take 1 tablet by mouth daily.    [name] Kitchen lovastatin (MEVACOR) 20 MG tablet Take 1 tablet (20 mg total) by mouth every morning. 90 tablet 3  . Multiple Vitamins-Minerals (ONE-A-DAY MENS 50+ ADVANTAGE) TABS Take 1 tablet by mouth daily.     No current facility-administered medications for this visit.    Allergies as of 02/20/2019  . (No Known Allergies)    Family History  Problem Relation Age of Onset  . Thyroid disease Mother   . COPD Father   . Cancer Maternal Aunt   . Cancer Maternal Uncle        back  . Cancer Paternal Uncle        throat   . Cancer Cousin        prostate  . Colon cancer Neg Hx   . Colon polyps Neg Hx     Social History   Socioeconomic History  . Marital status: Married    Spouse name: Not on file  . Number of children: Not on file  . Years  of education: Not on file  . Highest education level: Not on file  Occupational History  . Not on file  Tobacco Use  . Smoking status: Former Smoker    Packs/day: 0.50    Years: 2.00    Pack years: 1.00    Quit date: 03/30/1983    Years since quitting: 35.9  . Smokeless tobacco: Never Used  Substance and Sexual Activity  . Alcohol use: Yes    Comment: occ  . Drug use: No  . Sexual activity: Yes    Comment: married for 1985, 6 grown kids  Other Topics Concern  . Not on file  Social History Narrative  . Not on file   Social Determinants of Health   Financial Resource Strain:   . Difficulty of Paying Living Expenses: Not on file  Food  Insecurity:   . Worried About Charity fundraiser in the Last Year: Not on file  . Ran Out of Food in the Last Year: Not on file  Transportation Needs:   . Lack of Transportation (Medical): Not on file  . Lack of Transportation (Non-Medical): Not on file  Physical Activity:   . Days of Exercise per Week: Not on file  . Minutes of Exercise per Session: Not on file  Stress:   . Feeling of Stress : Not on file  Social Connections:   . Frequency of Communication with Friends and Family: Not on file  . Frequency of Social Gatherings with Friends and Family: Not on file  . Attends Religious Services: Not on file  . Active Member of Clubs or Organizations: Not on file  . Attends Archivist Meetings: Not on file  . Marital Status: Not on file    Review of Systems: Gen: Denies fever, chills, anorexia. Denies fatigue, weakness, weight loss.  CV: Denies chest pain, palpitations, syncope, peripheral edema, and claudication. Resp: Denies dyspnea at rest, cough, wheezing, coughing up blood, and pleurisy. GI: Denies vomiting blood, jaundice, and fecal incontinence.   Denies dysphagia or odynophagia. Derm: Denies rash, itching, dry skin Psych: Denies depression, anxiety, memory loss, confusion. No homicidal or suicidal ideation.  Heme: Denies bruising, bleeding, and enlarged lymph nodes.  Physical Exam: There were no vitals taken for this visit. General:   Alert and oriented. No distress noted. Pleasant and cooperative.  Head:  Normocephalic and atraumatic. Eyes:  Conjuctiva clear without scleral icterus. Mouth:  Oral mucosa pink and moist. Good dentition. No lesions. Heart:  S1, S2 present without murmurs appreciated. Lungs:  Clear to auscultation bilaterally. No wheezes, rales, or rhonchi. No distress.  Abdomen:  +BS, soft, non-tender and non-distended. No rebound or guarding. No HSM or masses noted. Msk:  Symmetrical without gross deformities. Normal posture. Extremities:  Without  edema. Neurologic:  Alert and  oriented x4 Psych:  Alert and cooperative. Normal mood and affect.

## 2019-02-20 ENCOUNTER — Ambulatory Visit: Payer: PRIVATE HEALTH INSURANCE | Admitting: Gastroenterology

## 2019-02-20 ENCOUNTER — Telehealth: Payer: Self-pay | Admitting: Internal Medicine

## 2019-02-20 ENCOUNTER — Encounter: Payer: Self-pay | Admitting: Gastroenterology

## 2019-02-20 ENCOUNTER — Encounter: Payer: Self-pay | Admitting: Internal Medicine

## 2019-02-20 NOTE — Telephone Encounter (Signed)
Patient was a no show and letter sent  °

## 2019-02-20 NOTE — Telephone Encounter (Signed)
Noted  

## 2019-02-25 ENCOUNTER — Telehealth: Payer: Self-pay | Admitting: Family Medicine

## 2019-02-25 ENCOUNTER — Other Ambulatory Visit: Payer: Self-pay

## 2019-02-25 NOTE — Telephone Encounter (Signed)
Pt called stating that he needs an appt asap regarding his BP because his BP has been out of control since having colonoscopy on Nov 9th. Says BP this morning was 151/101. Wants to be called back asap.

## 2019-02-25 NOTE — Telephone Encounter (Signed)
Appointment given for [place] in the am.

## 2019-02-26 ENCOUNTER — Ambulatory Visit (INDEPENDENT_AMBULATORY_CARE_PROVIDER_SITE_OTHER): Payer: PRIVATE HEALTH INSURANCE | Admitting: Family Medicine

## 2019-02-26 ENCOUNTER — Encounter: Payer: Self-pay | Admitting: Family Medicine

## 2019-02-26 VITALS — BP 126/88 | HR 93 | Temp 97.8°F | Ht 70.0 in | Wt 329.0 lb

## 2019-02-26 DIAGNOSIS — I10 Essential (primary) hypertension: Secondary | ICD-10-CM

## 2019-02-26 MED ORDER — LISINOPRIL-HYDROCHLOROTHIAZIDE 20-25 MG PO TABS: 1.5000 | ORAL_TABLET | Freq: Every day | ORAL | 2 refills | Status: DC

## 2019-02-26 NOTE — Progress Notes (Signed)
   Assessment & Plan:  1. Essential hypertension - Consistently elevated. Increased lisinopril-hydrochlorothiazide from 1 tablet to 1.5 tablets daily. Encouraged patient to continue to keep a log of his BP and bring with him to his next appointment. Education provided on the DASH diet. Goal < 140/90.  - lisinopril-hydrochlorothiazide (ZESTORETIC) 20-25 MG tablet; Take 1.5 tablets by mouth daily.  Dispense: 45 tablet; Refill: 2   Follow up plan: Return in about 3 weeks (around 03/19/2019) for HTN.  Hendricks Limes, MSN, APRN, FNP-C [facility]  Subjective:   Patient ID: Micheal Ross., male    DOB: September 12, 1960, 58 y.o.   MRN: [id number]  HPI: Jakwon Hacke. is a 58 y.o. male presenting on 02/26/2019 for Hypertension  Patient has been keeping a log of his blood pressure at home and has been getting elevated readings. He reports this is new since his colonoscopy last week. His systolic ranges from [id number] with 0000000 > [id number]; diastolic ranges 0000000 with 7/9 > 90; HR ranges 72-87. He has been taking the lisinopril-hydrochlorothiazide without missing any dosages. His BP readings at home are after he takes his medication.    ROS: Negative unless specifically indicated above in HPI.   Relevant past medical history reviewed and updated as indicated.   Allergies and medications reviewed and updated.   Current Outpatient Medications:  .  lisinopril-hydrochlorothiazide (ZESTORETIC) 20-25 MG tablet, Take 1.5 tablets by mouth daily., Disp: 45 tablet, Rfl: 2 .  lovastatin (MEVACOR) 20 MG tablet, Take 1 tablet (20 mg total) by mouth every morning., Disp: 90 tablet, Rfl: 3 .  Multiple Vitamins-Minerals (ONE-A-DAY MENS 50+ ADVANTAGE) TABS, Take 1 tablet by mouth daily., Disp: , Rfl:   No Known Allergies  Objective:   BP 126/88   Pulse 93   Temp 97.8 F (36.6 C) (Temporal)   Ht 5\' 10"  (1.778 m)   Wt (!) 329 lb (149.2 kg)   SpO2 99%   BMI 47.21 kg/m    Physical  Exam Vitals reviewed.  Constitutional:      General: He is not in acute distress.    Appearance: Normal appearance. He is morbidly obese. He is not ill-appearing, toxic-appearing or diaphoretic.  HENT:     Head: Normocephalic and atraumatic.  Eyes:     General: No scleral icterus.       Right eye: No discharge.        Left eye: No discharge.     Conjunctiva/sclera: Conjunctivae normal.  Cardiovascular:     Rate and Rhythm: Normal rate and regular rhythm.     Heart sounds: Normal heart sounds. No murmur. No friction rub. No gallop.   Pulmonary:     Effort: Pulmonary effort is normal. No respiratory distress.     Breath sounds: Normal breath sounds. No stridor. No wheezing, rhonchi or rales.  Musculoskeletal:        General: Normal range of motion.     Cervical back: Normal range of motion.  Skin:    General: Skin is warm and dry.  Neurological:     Mental Status: He is alert and oriented to person, place, and time. Mental status is at baseline.  Psychiatric:        Mood and Affect: Mood normal.        Behavior: Behavior normal.        Thought Content: Thought content normal.        Judgment: Judgment normal.

## 2019-02-26 NOTE — Patient Instructions (Signed)

## 2019-03-04 ENCOUNTER — Other Ambulatory Visit (HOSPITAL_BASED_OUTPATIENT_CLINIC_OR_DEPARTMENT_OTHER): Payer: Self-pay

## 2019-03-04 DIAGNOSIS — G4730 Sleep apnea, unspecified: Secondary | ICD-10-CM

## 2019-03-04 DIAGNOSIS — R0683 Snoring: Secondary | ICD-10-CM

## 2019-03-25 ENCOUNTER — Other Ambulatory Visit (HOSPITAL_COMMUNITY)
Admission: RE | Admit: 2019-03-25 | Discharge: 2019-03-25 | Disposition: A | Discharge status: Home / Self Care | Payer: 59 | Source: Ambulatory Visit | Attending: Neurology | Admitting: Neurology

## 2019-03-25 ENCOUNTER — Other Ambulatory Visit: Payer: Self-pay

## 2019-03-25 DIAGNOSIS — Z20822 Contact with and (suspected) exposure to covid-19: Secondary | ICD-10-CM | POA: Diagnosis not present

## 2019-03-25 DIAGNOSIS — Z01812 Encounter for preprocedural laboratory examination: Secondary | ICD-10-CM | POA: Diagnosis not present

## 2019-03-25 LAB — SARS CORONAVIRUS 2 (TAT 6-24 HRS): SARS Coronavirus 2: NEGATIVE

## 2019-03-28 ENCOUNTER — Ambulatory Visit: Payer: 59 | Attending: Family Medicine | Admitting: Neurology

## 2019-03-28 ENCOUNTER — Other Ambulatory Visit: Payer: Self-pay

## 2019-03-28 DIAGNOSIS — G4730 Sleep apnea, unspecified: Secondary | ICD-10-CM | POA: Diagnosis not present

## 2019-03-28 DIAGNOSIS — R0683 Snoring: Secondary | ICD-10-CM | POA: Insufficient documentation

## 2019-03-31 ENCOUNTER — Other Ambulatory Visit: Payer: Self-pay

## 2019-03-31 ENCOUNTER — Encounter: Payer: Self-pay | Admitting: Family Medicine

## 2019-03-31 ENCOUNTER — Ambulatory Visit (INDEPENDENT_AMBULATORY_CARE_PROVIDER_SITE_OTHER): Payer: 59 | Admitting: Family Medicine

## 2019-03-31 VITALS — BP 124/82 | HR 82 | Temp 98.7°F | Ht 70.0 in | Wt 331.0 lb

## 2019-03-31 DIAGNOSIS — I10 Essential (primary) hypertension: Secondary | ICD-10-CM

## 2019-03-31 DIAGNOSIS — K219 Gastro-esophageal reflux disease without esophagitis: Secondary | ICD-10-CM

## 2019-03-31 DIAGNOSIS — E782 Mixed hyperlipidemia: Secondary | ICD-10-CM | POA: Diagnosis not present

## 2019-03-31 DIAGNOSIS — G4733 Obstructive sleep apnea (adult) (pediatric): Secondary | ICD-10-CM | POA: Insufficient documentation

## 2019-03-31 DIAGNOSIS — Z0000 Encounter for general adult medical examination without abnormal findings: Secondary | ICD-10-CM

## 2019-03-31 MED ORDER — LOVASTATIN 20 MG PO TABS: 20.0000 mg | ORAL_TABLET | Freq: Every morning | ORAL | 3 refills | Status: DC

## 2019-03-31 MED ORDER — LISINOPRIL-HYDROCHLOROTHIAZIDE 20-25 MG PO TABS: 1.5000 | ORAL_TABLET | Freq: Every day | ORAL | 3 refills | Status: DC

## 2019-03-31 NOTE — Progress Notes (Signed)
BP 124/82   Pulse 82   Temp 98.7 F (37.1 C) (Temporal)   Ht 5' 10"  (1.778 m)   Wt (!) 331 lb (150.1 kg)   SpO2 96%   BMI 47.49 kg/m    Subjective:   Patient ID: Micheal Sabal., male    DOB: 1960-08-28, 59 y.o.   MRN: [id number]  HPI: Micheal Rosier. is a 59 y.o. male presenting on 03/31/2019 for Annual Exam   HPI Patient is coming in today for adult well exam and physical and recheck on hypertension and cholesterol and blood work Patient denies any chest pain, shortness of breath, headaches or vision issues, abdominal complaints, diarrhea, nausea, vomiting, or joint issues.   Hypertension Patient is currently on lisinopril hydrochlorothiazide and it was just recently increased to 1-1/2 tablets by mouth daily and his blood pressures are running a lot better, and their blood pressure today is 124/82. Patient denies any lightheadedness or dizziness. Patient denies headaches, blurred vision, chest pains, shortness of breath, or weakness. Denies any side effects from medication and is content with current medication.   Hyperlipidemia Patient is coming in for recheck of his hyperlipidemia. The patient is currently taking lovastatin. They deny any issues with myalgias or history of liver damage from it. They deny any focal numbness or weakness or chest pain.   Relevant past medical, surgical, family and social history reviewed and updated as indicated. Interim medical history since our last visit reviewed. Allergies and medications reviewed and updated.  Review of Systems  Constitutional: Negative for chills and fever.  HENT: Negative for ear pain and tinnitus.   Eyes: Negative for pain.  Respiratory: Negative for cough, shortness of breath and wheezing.   Cardiovascular: Negative for chest pain, palpitations and leg swelling.  Gastrointestinal: Negative for abdominal pain, blood in stool, constipation and diarrhea.  Genitourinary: Negative for dysuria and hematuria.    Musculoskeletal: Negative for back pain and myalgias.  Skin: Negative for rash.  Neurological: Negative for dizziness, weakness and headaches.  Psychiatric/Behavioral: Negative for suicidal ideas.    Per HPI unless specifically indicated above   Allergies as of 03/31/2019   No Known Allergies     Medication List       Accurate as of March 31, 2019 10:41 AM. If you have any questions, ask your nurse or doctor.        lisinopril-hydrochlorothiazide 20-25 MG tablet Commonly known as: ZESTORETIC Take 1.5 tablets by mouth daily.   lovastatin 20 MG tablet Commonly known as: MEVACOR Take 1 tablet (20 mg total) by mouth every morning.   One-A-Day Mens 50+ Advantage Tabs Take 1 tablet by mouth daily.        Objective:   BP 124/82   Pulse 82   Temp 98.7 F (37.1 C) (Temporal)   Ht 5' 10"  (1.778 m)   Wt (!) 331 lb (150.1 kg)   SpO2 96%   BMI 47.49 kg/m   Wt Readings from Last 3 Encounters:  03/31/19 (!) 331 lb (150.1 kg)  02/26/19 (!) 329 lb (149.2 kg)  01/20/19 (!) 325 lb (147.4 kg)    Physical Exam Vitals and nursing note reviewed.  Constitutional:      General: He is not in acute distress.    Appearance: He is well-developed. He is not diaphoretic.  Eyes:     General: No scleral icterus.    Conjunctiva/sclera: Conjunctivae normal.  Neck:     Thyroid: No thyromegaly.  Cardiovascular:     Rate  and Rhythm: Normal rate and regular rhythm.     Heart sounds: Normal heart sounds. No murmur.  Pulmonary:     Effort: Pulmonary effort is normal. No respiratory distress.     Breath sounds: Normal breath sounds. No wheezing.  Musculoskeletal:        General: Normal range of motion.     Cervical back: Neck supple.  Lymphadenopathy:     Cervical: No cervical adenopathy.  Skin:    General: Skin is warm and dry.     Findings: No rash.  Neurological:     Mental Status: He is alert and oriented to person, place, and time.     Coordination: Coordination normal.   Psychiatric:        Behavior: Behavior normal.       Assessment & Plan:   Problem List Items Addressed This Visit      Cardiovascular and Mediastinum   Hypertension   Relevant Medications   lisinopril-hydrochlorothiazide (ZESTORETIC) 20-25 MG tablet   lovastatin (MEVACOR) 20 MG tablet   Other Relevant Orders   CMP14+EGFR     Digestive   Gastroesophageal reflux disease   Relevant Orders   CBC with Differential/Platelet     Other   Hyperlipidemia   Relevant Medications   lisinopril-hydrochlorothiazide (ZESTORETIC) 20-25 MG tablet   lovastatin (MEVACOR) 20 MG tablet   Other Relevant Orders   Lipid panel    Other Visit Diagnoses    Well adult exam    -  Primary      Continue current medication blood pressure is looking better on exam and a half and will keep that going    Follow up plan: Return in about 6 months (around 09/28/2019), or if symptoms worsen or fail to improve, for Hypertension and cholesterol GERD.  Counseling provided for all of the vaccine components Orders Placed This Encounter  Procedures  . CBC with Differential/Platelet  . CMP14+EGFR  . Lipid panel    Micheal Pina, MD [facility] 03/31/2019, 10:41 AM

## 2019-04-01 ENCOUNTER — Other Ambulatory Visit: Payer: Self-pay | Admitting: *Deleted

## 2019-04-01 DIAGNOSIS — E782 Mixed hyperlipidemia: Secondary | ICD-10-CM

## 2019-04-01 LAB — CBC WITH DIFFERENTIAL/PLATELET
Basophils Absolute: 0 10*3/uL (ref 0.0–0.2)
Basos: 1 %
EOS (ABSOLUTE): 0.1 10*3/uL (ref 0.0–0.4)
Eos: 2 %
Hematocrit: 39.7 % (ref 37.5–51.0)
Hemoglobin: 13.4 g/dL (ref 13.0–17.7)
Immature Grans (Abs): 0 10*3/uL (ref 0.0–0.1)
Immature Granulocytes: 0 %
Lymphocytes Absolute: 1.7 10*3/uL (ref 0.7–3.1)
Lymphs: 26 %
MCH: 29.8 pg (ref 26.6–33.0)
MCHC: 33.8 g/dL (ref 31.5–35.7)
MCV: 88 fL (ref 79–97)
Monocytes Absolute: 0.6 10*3/uL (ref 0.1–0.9)
Monocytes: 10 %
Neutrophils Absolute: 3.9 10*3/uL (ref 1.4–7.0)
Neutrophils: 61 %
Platelets: 257 10*3/uL (ref 150–450)
RBC: 4.5 x10E6/uL (ref 4.14–5.80)
RDW: 14 % (ref 11.6–15.4)
WBC: 6.3 10*3/uL (ref 3.4–10.8)

## 2019-04-01 LAB — CMP14+EGFR
ALT: 28 IU/L (ref 0–44)
AST: 23 IU/L (ref 0–40)
Albumin/Globulin Ratio: 1.7 (ref 1.2–2.2)
Albumin: 4.8 g/dL (ref 3.8–4.9)
Alkaline Phosphatase: 84 IU/L (ref 39–117)
BUN/Creatinine Ratio: 10 (ref 9–20)
BUN: 13 mg/dL (ref 6–24)
Bilirubin Total: <0.2 mg/dL (ref 0.0–1.2)
CO2: 23 mmol/L (ref 20–29)
Calcium: 9.9 mg/dL (ref 8.7–10.2)
Chloride: 97 mmol/L (ref 96–106)
Creatinine, Ser: 1.25 mg/dL (ref 0.76–1.27)
GFR calc Af Amer: 72 mL/min/{1.73_m2} (ref >59)
GFR calc non Af Amer: 63 mL/min/{1.73_m2} (ref >59)
Globulin, Total: 2.8 g/dL (ref 1.5–4.5)
Glucose: 100 mg/dL — ABNORMAL HIGH (ref 65–99)
Potassium: 4.1 mmol/L (ref 3.5–5.2)
Sodium: 139 mmol/L (ref 134–144)
Total Protein: 7.6 g/dL (ref 6.0–8.5)

## 2019-04-01 LAB — LIPID PANEL
Chol/HDL Ratio: 4.9 ratio (ref 0.0–5.0)
Cholesterol, Total: 224 mg/dL — ABNORMAL HIGH (ref 100–199)
HDL: 46 mg/dL (ref >39)
LDL Chol Calc (NIH): 147 mg/dL — ABNORMAL HIGH (ref 0–99)
Triglycerides: 173 mg/dL — ABNORMAL HIGH (ref 0–149)
VLDL Cholesterol Cal: 31 mg/dL (ref 5–40)

## 2019-04-01 MED ORDER — LOVASTATIN 40 MG PO TABS: 40.0000 mg | ORAL_TABLET | Freq: Every morning | ORAL | 3 refills | Status: DC

## 2019-04-06 NOTE — Procedures (Signed)
  [place] A. Merlene Laughter, MD     [email or web address]             NOCTURNAL POLYSOMNOGRAPHY   LOCATION: [facility]   Patient Name: Micheal Ross, Micheal Ross Date: 03/28/2019 Gender: Male D.O.B: 1960-08-11 Age (years): 58 Referring Provider: Vonna Kotyk A Dettinger Height (inches): 56 Interpreting Physician: Phillips Odor MD, ABSM Weight (lbs): 329 RPSGT: Peak, Robert BMI: 47 MRN: [id number] Neck Size: 18.50 CLINICAL INFORMATION Sleep Study Type: NPSG     Indication for sleep study: Snoring, Witnesses Apnea / Gasping During Sleep     Epworth Sleepiness Score: 7     SLEEP STUDY TECHNIQUE As per the AASM Manual for the Scoring of Sleep and Associated Events v2.3 (April 2016) with a hypopnea requiring 4% desaturations.  The channels recorded and monitored were frontal, central and occipital EEG, electrooculogram (EOG), submentalis EMG (chin), nasal and oral airflow, thoracic and abdominal wall motion, anterior tibialis EMG, snore microphone, electrocardiogram, and pulse oximetry.  MEDICATIONS Medications self-administered by patient taken the night of the study : N/A  Current Outpatient Medications:  .  lisinopril-hydrochlorothiazide (ZESTORETIC) 20-25 MG tablet, Take 1.5 tablets by mouth daily., Disp: 135 tablet, Rfl: 3 .  lovastatin (MEVACOR) 40 MG tablet, Take 1 tablet (40 mg total) by mouth every morning., Disp: 90 tablet, Rfl: 3 .  Multiple Vitamins-Minerals (ONE-A-DAY MENS 50+ ADVANTAGE) TABS, Take 1 tablet by mouth daily., Disp: , Rfl:      SLEEP ARCHITECTURE The study was initiated at 9:20:36 PM and ended at 3:26:49 AM.  Sleep onset time was 13.6 minutes and the sleep efficiency was 63.8%%. The total sleep time was 233.5 minutes.  Stage REM latency was N/A minutes.  The patient spent 3.9%% of the night in stage N1 sleep, 96.1%% in stage N2 sleep, 0.0%% in stage N3 and 0% in REM.  Alpha intrusion was absent.  Supine sleep was  44.73%.  RESPIRATORY PARAMETERS The overall apnea/hypopnea index (AHI) was 105.6 per hour. There were 273 total apneas, including 262 obstructive, 10 central and 1 mixed apneas. There were 138 hypopneas and 0 RERAs.  The AHI during Stage REM sleep was N/A per hour.  AHI while supine was 114.9 per hour.  The mean oxygen saturation was 87.3%. The minimum SpO2 during sleep was 69.0%.  loud snoring was noted during this study.  CARDIAC DATA The 2 lead EKG demonstrated sinus rhythm. The mean heart rate was 59.0 beats per minute. Other EKG findings include: PVCs.   LEG MOVEMENT DATA The total PLMS were 0 with a resulting PLMS index of 0.0. Associated arousal with leg movement index was 0.0.  IMPRESSIONS 1.  Severe obstructive sleep apnea syndrome is documented with this study. AutoPAP 10-20 is recommended.  Delano Metz, MD Diplomate, American Board of Sleep Medicine. ELECTRONICALLY SIGNED ON:  04/06/2019, 7:38 PM [place] PH: [phone]   FX: [phone] [place]

## 2019-04-08 ENCOUNTER — Encounter: Payer: PRIVATE HEALTH INSURANCE | Admitting: Family Medicine

## 2019-04-18 ENCOUNTER — Telehealth: Payer: Self-pay | Admitting: Family Medicine

## 2019-04-21 NOTE — Telephone Encounter (Signed)
Not sure why Patient is calling in regarding Ref's - Per Epic patient had sleep study done on 03/28/2019 and he was seen by Gastro on 11/14/2018.

## 2019-04-21 NOTE — Telephone Encounter (Signed)
Returned patient's call and he will call back if he needs a referral to GI.

## 2019-04-21 NOTE — Telephone Encounter (Signed)
I do not recall either, we may have to give them a call to see.

## 2019-04-23 ENCOUNTER — Ambulatory Visit: Payer: PRIVATE HEALTH INSURANCE | Admitting: Gastroenterology

## 2019-05-04 ENCOUNTER — Ambulatory Visit: Payer: 59 | Attending: Internal Medicine

## 2019-05-04 DIAGNOSIS — Z23 Encounter for immunization: Secondary | ICD-10-CM | POA: Insufficient documentation

## 2019-05-04 NOTE — Progress Notes (Addendum)
   [id number]  Name:  Micheal Ross.    MRN: [id number] DOB: 07/21/1960  05/04/2019  Micheal Ross was observed post Covid-19 immunization for 15 minutes without incidence. He was provided with Vaccine Information Sheet and instruction to access the V-Safe system.   Micheal Ross was instructed to call 911 with any severe reactions post vaccine: [name] Kitchen Difficulty breathing  . Swelling of your face and throat  . A fast heartbeat  . A bad rash all over your body  . Dizziness and weakness    Immunizations Administered    Name Date Dose VIS Date Route   Moderna COVID-19 Vaccine 05/04/2019 11:06 AM 0.5 mL 02/04/2019 Intramuscular   Manufacturer: Moderna   Lot: [id number]   [id number]

## 2019-05-13 ENCOUNTER — Telehealth: Payer: Self-pay | Admitting: Family Medicine

## 2019-05-13 NOTE — Telephone Encounter (Signed)
Patient needs appointment for orders.

## 2019-05-14 NOTE — Telephone Encounter (Signed)
Left detailed message for patient to be seen.

## 2019-05-26 ENCOUNTER — Encounter: Payer: Self-pay | Admitting: Family Medicine

## 2019-05-26 ENCOUNTER — Telehealth (INDEPENDENT_AMBULATORY_CARE_PROVIDER_SITE_OTHER): Payer: 59 | Admitting: Family Medicine

## 2019-05-26 DIAGNOSIS — G4733 Obstructive sleep apnea (adult) (pediatric): Secondary | ICD-10-CM

## 2019-05-26 NOTE — Progress Notes (Signed)
   Virtual Visit via Mychart video Note  I connected with Micheal Ross. on 05/26/19 at [phone] by video and verified that I am speaking with the correct person using two identifiers. Micheal Ross. is currently located at car and no other people are currently with her during visit. The provider, Fransisca Kaufmann Destin Vinsant, MD is located in their office at time of visit.  Call ended at [phone]  I discussed the limitations, risks, security and privacy concerns of performing an evaluation and management service by video and the availability of in person appointments. I also discussed with the patient that there may be a patient responsible charge related to this service. The patient expressed understanding and agreed to proceed.   History and Present Illness: Patient is calling in with diagnosis for severe sleep apnea after test in January.  he got information that was mixed on where he needs to get the sleep titration study and how to get it set up, we will get that ordered for him.  1. Obstructive sleep apnea     Outpatient Encounter Medications as of 05/26/2019  Medication Sig  . lisinopril-hydrochlorothiazide (ZESTORETIC) 20-25 MG tablet Take 1.5 tablets by mouth daily.  [name] Kitchen lovastatin (MEVACOR) 40 MG tablet Take 1 tablet (40 mg total) by mouth every morning.  . Multiple Vitamins-Minerals (ONE-A-DAY MENS 50+ ADVANTAGE) TABS Take 1 tablet by mouth daily.   No facility-administered encounter medications on file as of 05/26/2019.    Review of Systems  Constitutional: Negative for chills and fever.  Eyes: Negative for visual disturbance.  Respiratory: Negative for shortness of breath and wheezing.   Cardiovascular: Negative for chest pain and leg swelling.  Musculoskeletal: Negative for back pain and gait problem.  Skin: Negative for rash.  Neurological: Negative for dizziness, weakness and numbness.  All other systems reviewed and are negative.   Observations/Objective: Patient sounds  comfortable and in no acute distress  Assessment and Plan: Problem List Items Addressed This Visit      Respiratory   Obstructive sleep apnea - Primary   Relevant Orders   Ambulatory referral to Sleep Studies   Cpap titration      Has severe sleep apnea based on test, will set up for sleep titration so we can get the orders for him. Follow up plan: Return if symptoms worsen or fail to improve.     I discussed the assessment and treatment plan with the patient. The patient was provided an opportunity to ask questions and all were answered. The patient agreed with the plan and demonstrated an understanding of the instructions.   The patient was advised to call back or seek an in-person evaluation if the symptoms worsen or if the condition fails to improve as anticipated.  The above assessment and management plan was discussed with the patient. The patient verbalized understanding of and has agreed to the management plan. Patient is aware to call the clinic if symptoms persist or worsen. Patient is aware when to return to the clinic for a follow-up visit. Patient educated on when it is appropriate to go to the emergency department.    I provided 15 minutes of non-face-to-face time during this encounter.    [name], MD

## 2019-06-03 ENCOUNTER — Other Ambulatory Visit (HOSPITAL_BASED_OUTPATIENT_CLINIC_OR_DEPARTMENT_OTHER): Payer: Self-pay

## 2019-06-03 DIAGNOSIS — G4733 Obstructive sleep apnea (adult) (pediatric): Secondary | ICD-10-CM

## 2019-06-03 NOTE — Progress Notes (Signed)
-  op cv

## 2019-06-05 ENCOUNTER — Telehealth: Payer: Self-pay

## 2019-06-05 ENCOUNTER — Telehealth: Payer: Self-pay | Admitting: Family Medicine

## 2019-06-05 NOTE — Telephone Encounter (Signed)
Faxed sleep study to [facility] to approve titration study.  [phone]

## 2019-06-05 NOTE — Telephone Encounter (Signed)
Discussed over the counter medications.

## 2019-06-07 ENCOUNTER — Ambulatory Visit: Payer: 59 | Attending: Internal Medicine

## 2019-06-07 DIAGNOSIS — Z23 Encounter for immunization: Secondary | ICD-10-CM

## 2019-06-07 NOTE — Progress Notes (Signed)
   [id number]  Name:  Micheal Ross.    MRN: [id number] DOB: May 14, 1960  06/07/2019  Micheal Ross was observed post Covid-19 immunization for 15 minutes without incident. He was provided with Vaccine Information Sheet and instruction to access the V-Safe system.   Micheal Ross was instructed to call 911 with any severe reactions post vaccine: [name] Kitchen Difficulty breathing  . Swelling of face and throat  . A fast heartbeat  . A bad rash all over body  . Dizziness and weakness   Immunizations Administered    Name Date Dose VIS Date Route   Moderna COVID-19 Vaccine 06/07/2019 10:29 AM 0.5 mL 02/04/2019 Intramuscular   Manufacturer: Moderna   Lot: [id number]   [id number]

## 2019-06-10 ENCOUNTER — Other Ambulatory Visit: Payer: Self-pay

## 2019-06-10 ENCOUNTER — Other Ambulatory Visit (HOSPITAL_COMMUNITY)
Admission: RE | Admit: 2019-06-10 | Discharge: 2019-06-10 | Disposition: A | Discharge status: Home / Self Care | Payer: 59 | Source: Ambulatory Visit | Attending: Neurology | Admitting: Neurology

## 2019-06-10 DIAGNOSIS — Z01812 Encounter for preprocedural laboratory examination: Secondary | ICD-10-CM | POA: Insufficient documentation

## 2019-06-10 DIAGNOSIS — Z20822 Contact with and (suspected) exposure to covid-19: Secondary | ICD-10-CM | POA: Insufficient documentation

## 2019-06-11 LAB — SARS CORONAVIRUS 2 (TAT 6-24 HRS): SARS Coronavirus 2: NEGATIVE

## 2019-06-13 ENCOUNTER — Other Ambulatory Visit: Payer: Self-pay

## 2019-06-13 ENCOUNTER — Ambulatory Visit: Payer: 59 | Attending: Family Medicine | Admitting: Neurology

## 2019-06-13 DIAGNOSIS — G4733 Obstructive sleep apnea (adult) (pediatric): Secondary | ICD-10-CM | POA: Diagnosis not present

## 2019-06-16 ENCOUNTER — Telehealth: Payer: Self-pay | Admitting: Family Medicine

## 2019-06-16 NOTE — Telephone Encounter (Signed)
Taking zestoretic medication.  What are the best medicines from OTC for patient to use for allergy symptoms? Thanks for suggestions.

## 2019-06-17 NOTE — Telephone Encounter (Signed)
He can use Claritin or Zyrtec or Allegra or Xyzal and should do good for him.  Just avoid Sudafed and anything with dextromethorphan or the D, for example Claritin-D or Zyrtec-D or Allegra-D should be avoided but he can do the plain Claritin or Zyrtec or Allegra or generic for them.  He can also use Flonase to help with allergy symptoms as well. Caryl Pina, MD [facility] 06/17/2019, 8:51 AM

## 2019-06-17 NOTE — Telephone Encounter (Signed)
Patient aware and verbalized understanding. °

## 2019-06-19 ENCOUNTER — Telehealth: Payer: Self-pay | Admitting: Family Medicine

## 2019-06-22 NOTE — Procedures (Signed)
  Slaughters A. Merlene Laughter, MD     [email or web address]             NOCTURNAL POLYSOMNOGRAPHY TITRATION  LOCATION: [facility]  Patient Name: Micheal Ross, Micheal Ross Date: 06/13/2019 Gender: Male D.O.B: Jun 28, 1960 Age (years): 58 Referring Provider: Vonna Kotyk A Dettinger Height (inches): 70 Interpreting Physician: Phillips Odor MD, ABSM Weight (lbs): 329 RPSGT: Peak, Robert BMI: 47 MRN: [id number] Neck Size: 18.50 CLINICAL INFORMATION The patient is referred for a BiPAP titration to treat sleep apnea.     Date of NPSG, Split Night or HST:  SLEEP STUDY TECHNIQUE As per the AASM Manual for the Scoring of Sleep and Associated Events v2.3 (April 2016) with a hypopnea requiring 4% desaturations.  The channels recorded and monitored were frontal, central and occipital EEG, electrooculogram (EOG), submentalis EMG (chin), nasal and oral airflow, thoracic and abdominal wall motion, anterior tibialis EMG, snore microphone, electrocardiogram, and pulse oximetry. Bilevel positive airway pressure (BPAP) was initiated at the beginning of the study and titrated to treat sleep-disordered breathing.  MEDICATIONS Medications self-administered by patient taken the night of the study : N/A  Current Outpatient Medications:  .  lisinopril-hydrochlorothiazide (ZESTORETIC) 20-25 MG tablet, Take 1.5 tablets by mouth daily., Disp: 135 tablet, Rfl: 3 .  lovastatin (MEVACOR) 40 MG tablet, Take 1 tablet (40 mg total) by mouth every morning., Disp: 90 tablet, Rfl: 3 .  Multiple Vitamins-Minerals (ONE-A-DAY MENS 50+ ADVANTAGE) TABS, Take 1 tablet by mouth daily., Disp: , Rfl:    RESPIRATORY PARAMETERS Optimal IPAP Pressure (cm): 18 AHI at Optimal Pressure (/hr) 5.2 Optimal EPAP Pressure (cm): 14   Overall Minimal O2 (%): 82.0 Minimal O2 at Optimal Pressure (%): 88.0 SLEEP ARCHITECTURE Start Time: 9:31:52 PM Stop Time: 5:30:40 AM Total Time (min): 478.8 Total Sleep Time (min): 443.6 Sleep  Latency (min): 6.2 Sleep Efficiency (%): 92.6% REM Latency (min): 120.5 WASO (min): 29.0 Stage N1 (%): 8.2% Stage N2 (%): 69.0% Stage N3 (%): 0.0% Stage R (%): 22.8 Supine (%): 36.92 Arousal Index (/hr): 18.3     CARDIAC DATA The 2 lead EKG demonstrated sinus rhythm. The mean heart rate was 73.6 beats per minute. Other EKG findings include: PVCs.  LEG MOVEMENT DATA The total Periodic Limb Movements of Sleep (PLMS) were 0. The PLMS index was 0.0. A PLMS index of <15 is considered normal in adults.  IMPRESSIONS The optimal BIPAP selected for this patient are ( 18 / 14 cm of water).    Delano Metz, MD Diplomate, American Board of Sleep Medicine.  ELECTRONICALLY SIGNED ON:  06/22/2019, 10:41 PM [place] PH: [phone]   FX: [phone] [place]

## 2019-06-23 NOTE — Telephone Encounter (Signed)
Please forward for BiPAP machine

## 2019-06-23 NOTE — Addendum Note (Signed)
Addended by: Caryl Pina on: 06/23/2019 07:56 AM   Modules accepted: Orders

## 2019-06-23 NOTE — Progress Notes (Signed)
Placed order for BiPAP for severe sleep apnea and supplies.

## 2019-06-23 NOTE — Telephone Encounter (Signed)
Community message sent to [place] with [facility] Pt aware he should hear from them

## 2019-09-09 ENCOUNTER — Telehealth: Payer: Self-pay | Admitting: Family Medicine

## 2019-09-10 NOTE — Telephone Encounter (Signed)
A dentist would be the best way to obtain those mouthpieces, they are usually 1 the ones that make that, not all dentist makes them.  The only problem with this is they are good for mild to moderate sleep apnea but not usually for severe and he has severe sleep apnea if I recall.

## 2019-09-11 NOTE — Telephone Encounter (Signed)
He will check with a dentist.

## 2019-11-13 ENCOUNTER — Ambulatory Visit (INDEPENDENT_AMBULATORY_CARE_PROVIDER_SITE_OTHER): Payer: 59 | Admitting: Family Medicine

## 2019-11-13 ENCOUNTER — Encounter: Payer: Self-pay | Admitting: Family Medicine

## 2019-11-13 ENCOUNTER — Other Ambulatory Visit: Payer: Self-pay

## 2019-11-13 DIAGNOSIS — R0981 Nasal congestion: Secondary | ICD-10-CM

## 2019-11-13 MED ORDER — FLUTICASONE PROPIONATE 50 MCG/ACT NA SUSP: 1.0000 | Freq: Two times a day (BID) | NASAL | 6 refills | Status: DC | PRN

## 2019-11-13 NOTE — Progress Notes (Signed)
Virtual Visit via telephone Note  I connected with Micheal Ross. on 11/13/19 at 1052 by telephone and verified that I am speaking with the correct person using two identifiers. Micheal Ross. is currently located at car and no other people are currently with her during visit. The provider, Fransisca Kaufmann Koree Schopf, MD is located in their office at time of visit.  Call ended at 1100  I discussed the limitations, risks, security and privacy concerns of performing an evaluation and management service by telephone and the availability of in person appointments. I also discussed with the patient that there may be a patient responsible charge related to this service. The patient expressed understanding and agreed to proceed.   History and Present Illness: Yesterday he awoke with a sore throat and now he is having a lot of congestion and sinus pressure today.  He has a cough at rest and runny nose last night.  He is using coricidin. He denies any shortness of breath or fevers or chills. 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   1. Sinus congestion     Outpatient Encounter Medications as of 11/13/2019  Medication Sig  . fluticasone (FLONASE) 50 MCG/ACT nasal spray Place 1 spray into both nostrils 2 (two) times daily as needed for allergies or rhinitis.  [name] Kitchen lisinopril-hydrochlorothiazide (ZESTORETIC) 20-25 MG tablet Take 1.5 tablets by mouth daily.  [name] Kitchen lovastatin (MEVACOR) 40 MG tablet Take 1 tablet (40 mg total) by mouth every morning.  . Multiple Vitamins-Minerals (ONE-A-DAY MENS 50+ ADVANTAGE) TABS Take 1 tablet by mouth daily.   No facility-administered encounter medications on file as of 11/13/2019.    Review of Systems  Constitutional: Negative for chills and fever.  HENT: Positive for congestion, postnasal drip, rhinorrhea, sinus pressure and sneezing. Negative for ear discharge, ear pain, sore throat and voice change.   Eyes: Negative for pain,  discharge, redness and visual disturbance.  Respiratory: Positive for cough. Negative for shortness of breath and wheezing.   Cardiovascular: Negative for chest pain and leg swelling.  Musculoskeletal: Negative for gait problem and myalgias.  Skin: Negative for rash.  All other systems reviewed and are negative.   Observations/Objective: Patient sounds comfortable and in no acute distress  Assessment and Plan: Problem List Items Addressed This Visit    None    Visit Diagnoses    Sinus congestion    -  Primary   Relevant Medications   fluticasone (FLONASE) 50 MCG/ACT nasal spray   Other Relevant Orders   Novel Coronavirus, NAA ([facility])       Follow up plan: Return if symptoms worsen or fail to improve.     I discussed the assessment and treatment plan with the patient. The patient was provided an opportunity to ask questions and all were answered. The patient agreed with the plan and demonstrated an understanding of the instructions.   The patient was advised to call back or seek an in-person evaluation if the symptoms worsen or if the condition fails to improve as anticipated.  The above assessment and management plan was discussed with the patient. The patient verbalized understanding of and has agreed to the management plan. Patient is aware to call the clinic if symptoms persist or worsen. Patient is aware when to return to the clinic for a follow-up visit. Patient educated on when it is appropriate to go to the emergency department.    I provided 8 minutes of non-face-to-face time during  this encounter.    [name], MD

## 2019-11-17 ENCOUNTER — Telehealth: Payer: Self-pay | Admitting: Family Medicine

## 2019-11-17 ENCOUNTER — Other Ambulatory Visit: Payer: 59

## 2019-11-17 DIAGNOSIS — R0981 Nasal congestion: Secondary | ICD-10-CM

## 2019-11-17 LAB — NOVEL CORONAVIRUS, NAA

## 2019-11-17 NOTE — Telephone Encounter (Signed)
Please let the patient know that I placed an order and he can come back in and be tested again

## 2019-11-17 NOTE — Telephone Encounter (Signed)
Patient aware and would like to come back in for re test so he can go back to work.  Patient will come in today for the drive up.

## 2019-11-17 NOTE — Telephone Encounter (Signed)
Do you want patient to come back in and get retested per the last test Test not performed. Insufficient specimen to perform or complete  Analysis. If aso you will have to order and we will have to call patient and get them to drive up to COVID teasting spot. Please advise

## 2019-11-18 ENCOUNTER — Telehealth: Payer: Self-pay | Admitting: Family Medicine

## 2019-11-18 ENCOUNTER — Other Ambulatory Visit: Payer: Self-pay | Admitting: Family Medicine

## 2019-11-18 NOTE — Telephone Encounter (Signed)
Patient had ov 11/13/2019 for congestion, sinus pressure, and cough - given Flonase  Please review and advised

## 2019-11-18 NOTE — Telephone Encounter (Signed)
  Prescription Request  11/18/2019  What is the name of the medication or equipment? Pt was just seen the other day. He has dry cough at night and wants something called in  Have you contacted your pharmacy to request a refill? (if applicable) no  Which pharmacy would you like this sent to? [facility]   Patient notified that their request is being sent to the clinical staff for review and that they should receive a response within 2 business days.

## 2019-11-19 ENCOUNTER — Encounter: Payer: Self-pay | Admitting: *Deleted

## 2019-11-19 LAB — SARS-COV-2, NAA 2 DAY TAT

## 2019-11-19 LAB — NOVEL CORONAVIRUS, NAA: SARS-CoV-2, NAA: NOT DETECTED

## 2019-11-19 MED ORDER — BENZONATATE 100 MG PO CAPS: 100.0000 mg | ORAL_CAPSULE | Freq: Two times a day (BID) | ORAL | 1 refills | Status: DC | PRN

## 2019-11-19 NOTE — Telephone Encounter (Signed)
Patient aware and verbalized understanding. °

## 2019-11-19 NOTE — Telephone Encounter (Signed)
I sent in [facility] for the patient.

## 2019-12-05 ENCOUNTER — Ambulatory Visit (INDEPENDENT_AMBULATORY_CARE_PROVIDER_SITE_OTHER): Payer: 59 | Admitting: Nurse Practitioner

## 2019-12-05 DIAGNOSIS — T63441A Toxic effect of venom of bees, accidental (unintentional), initial encounter: Secondary | ICD-10-CM | POA: Diagnosis not present

## 2019-12-05 MED ORDER — PREDNISONE 10 MG (21) PO TBPK: ORAL_TABLET | ORAL | 0 refills | Status: DC

## 2019-12-05 NOTE — Progress Notes (Signed)
   Virtual Visit via telephone Note Due to COVID-19 pandemic this visit was conducted virtually. This visit type was conducted due to national recommendations for restrictions regarding the COVID-19 Pandemic (e.g. social distancing, sheltering in place) in an effort to limit this patient's exposure and mitigate transmission in our community. All issues noted in this document were discussed and addressed.  A physical exam was not performed with this format.  I connected with Micheal Ross. on 12/05/19 at 11.52 am by telephone and verified that I am speaking with the correct person using two identifiers. Micheal Ross. is currently located at home and no one  is currently with patient  during visit. The provider, Ivy Lynn, NP is located in their office at time of visit.  I discussed the limitations, risks, security and privacy concerns of performing an evaluation and management service by telephone and the availability of in person appointments. I also discussed with the patient that there may be a patient responsible charge related to this service. The patient expressed understanding and agreed to proceed.   History and Present Illness:  Patient is reporting new insect bite in the last 3 days.  Patient reports he was doing his exercise sitting on the floor a black and yellow wasp stung him.  Patient is reporting very mild pain.  Patient has applied ice and cortisone cream, and Benadryl pills that has provided moderate relief.  Patient is reporting that sting site is red today and increased in size with a rash.      Review of Systems  Constitutional: Negative for chills and malaise/fatigue.  Eyes: Negative for blurred vision.  Respiratory: Negative for cough, shortness of breath and wheezing.   Skin: Positive for itching and rash.  All other systems reviewed and are negative.    Observations/Objective: Tele virtual visit  Assessment and Plan:  Bee sting Patient is  reporting new wasp bite in the last 3 days.  Patient reports he was doing his exercise sitting on the floor and a black and yellow wasp stung him.  Patient is reporting mild pain, patient is reporting increased redness at the sting site and itching.  Patient is using ice and cortisone cream with Benadryl pill.  Patient is reporting moderate relief.  Started patient on prednisone pack.  Patient denies any shortness of breath, wheezing fever or chills. Advised patient to follow-up with worsening or unresolved symptoms.  And in the future for anaphylactic reaction to go to the ED.  Follow Up Instructions: Worsening or unresolved symptoms.    I discussed the assessment and treatment plan with the patient. The patient was provided an opportunity to ask questions and all were answered. The patient agreed with the plan and demonstrated an understanding of the instructions.   The patient was advised to call back or seek an in-person evaluation if the symptoms worsen or if the condition fails to improve as anticipated.  The above assessment and management plan was discussed with the patient. The patient verbalized understanding of and has agreed to the management plan. Patient is aware to call the clinic if symptoms persist or worsen. Patient is aware when to return to the clinic for a follow-up visit. Patient educated on when it is appropriate to go to the emergency department.   Time call ended:  12.3 pm I provided 11 minutes of non-face-to-face time during this encounter.    Ivy Lynn, NP

## 2019-12-05 NOTE — Assessment & Plan Note (Signed)
Patient is reporting new wasp bite in the last 3 days.  Patient reports he was doing his exercise sitting on the floor and a black and yellow wasp stung him.  Patient is reporting mild pain, patient is reporting increased redness at the sting site and itching.  Patient is using ice and cortisone cream with Benadryl pill.  Patient is reporting moderate relief.  Started patient on prednisone pack.  Patient denies any shortness of breath, wheezing fever or chills. Advised patient to follow-up with worsening or unresolved symptoms.  And in the future for anaphylactic reaction to go to the ED.

## 2020-01-05 ENCOUNTER — Other Ambulatory Visit: Payer: Self-pay

## 2020-01-05 ENCOUNTER — Ambulatory Visit (INDEPENDENT_AMBULATORY_CARE_PROVIDER_SITE_OTHER): Payer: 59 | Admitting: Family Medicine

## 2020-01-05 ENCOUNTER — Ambulatory Visit (INDEPENDENT_AMBULATORY_CARE_PROVIDER_SITE_OTHER): Payer: 59

## 2020-01-05 ENCOUNTER — Encounter: Payer: Self-pay | Admitting: Family Medicine

## 2020-01-05 VITALS — BP 114/77 | HR 86 | Temp 98.1°F | Ht 70.0 in | Wt 322.4 lb

## 2020-01-05 DIAGNOSIS — M542 Cervicalgia: Secondary | ICD-10-CM | POA: Diagnosis not present

## 2020-01-05 IMAGING — DX DG CERVICAL SPINE COMPLETE 4+V
6 series · 6 of 6 positions shown · non-contrast
Comparison: None.

CLINICAL DATA: Neck and shoulder pain.  No reported injury.

EXAM:
CERVICAL SPINE - COMPLETE 4+ VIEW

[c-spine lat]
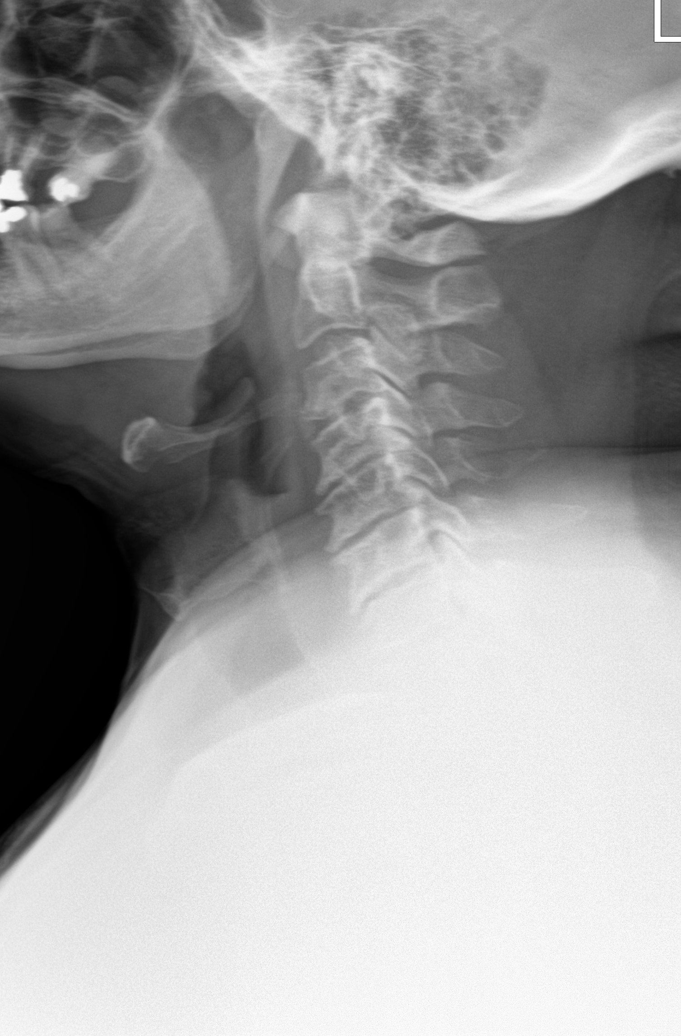

[c-spine obl (1 of 2)]
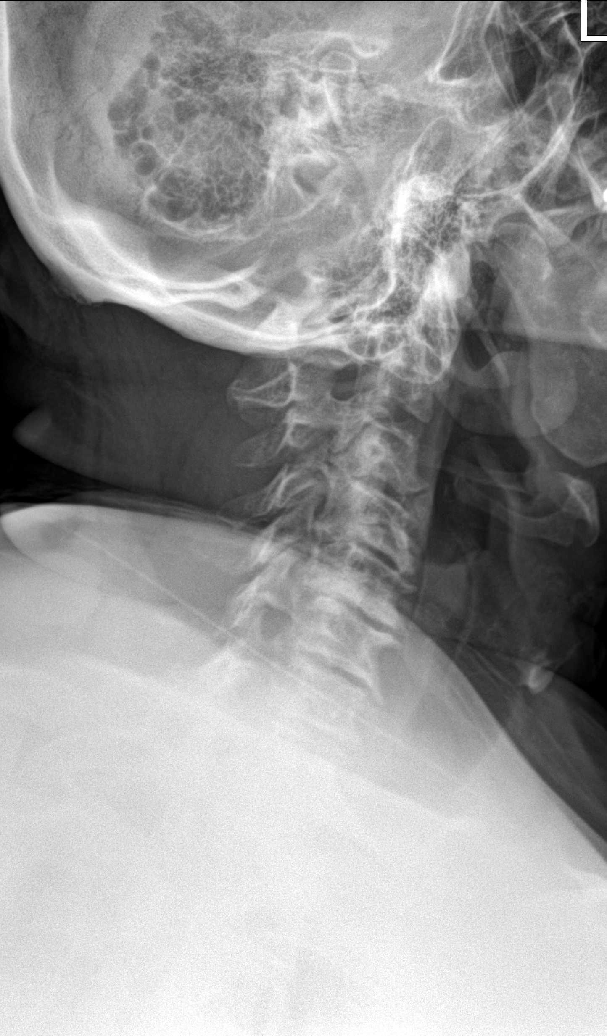

[c-spine obl (2 of 2)]
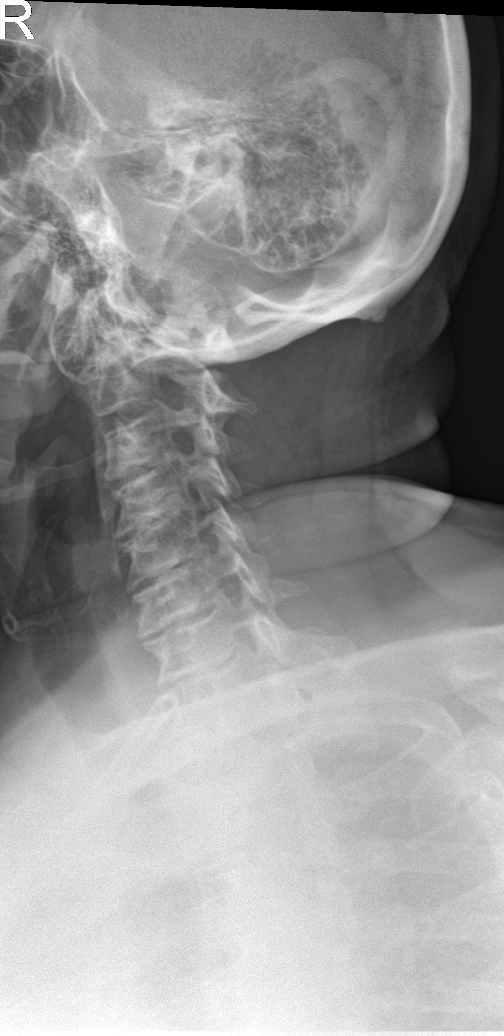

[c-spine ap]
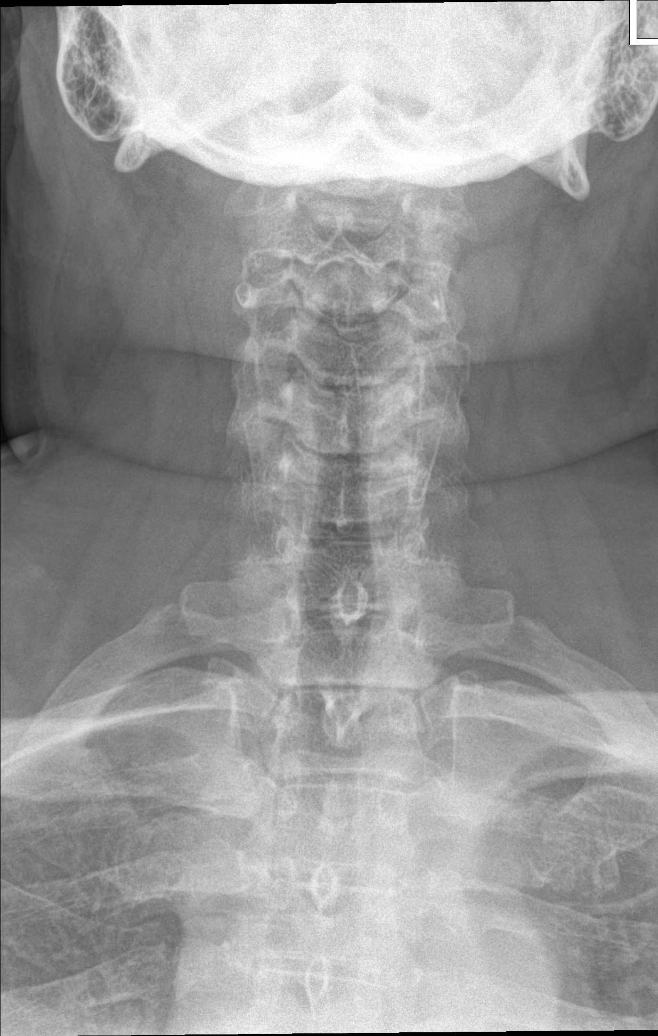

[c-spine open mouth]
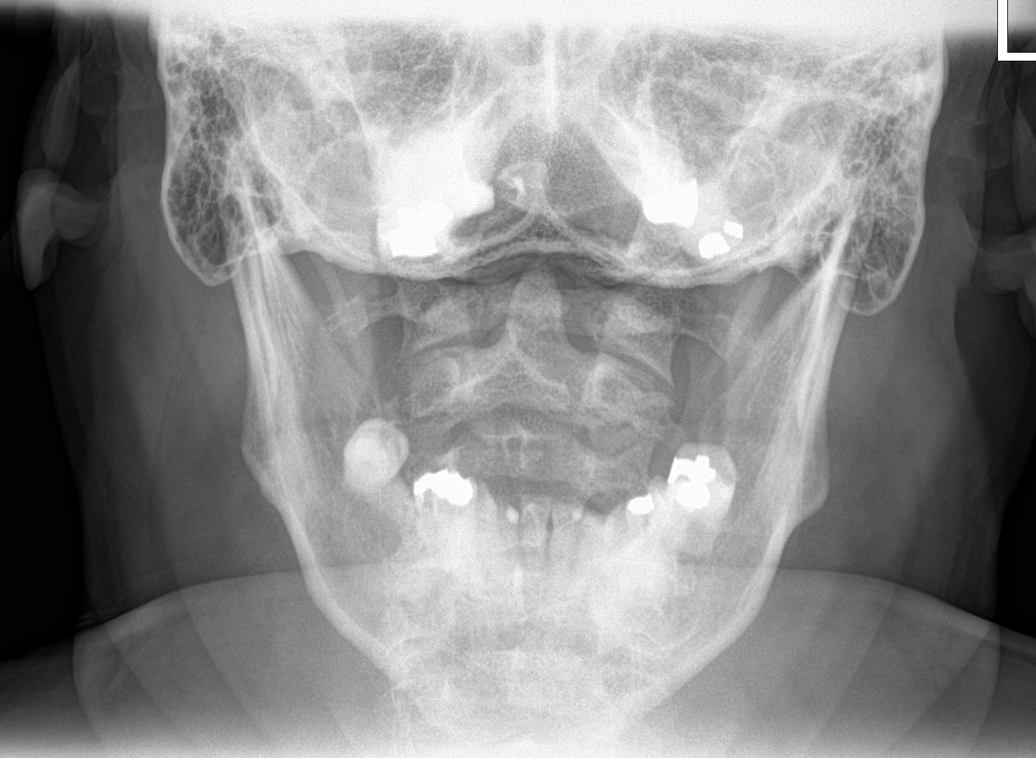

[t-spine lat swimmers]
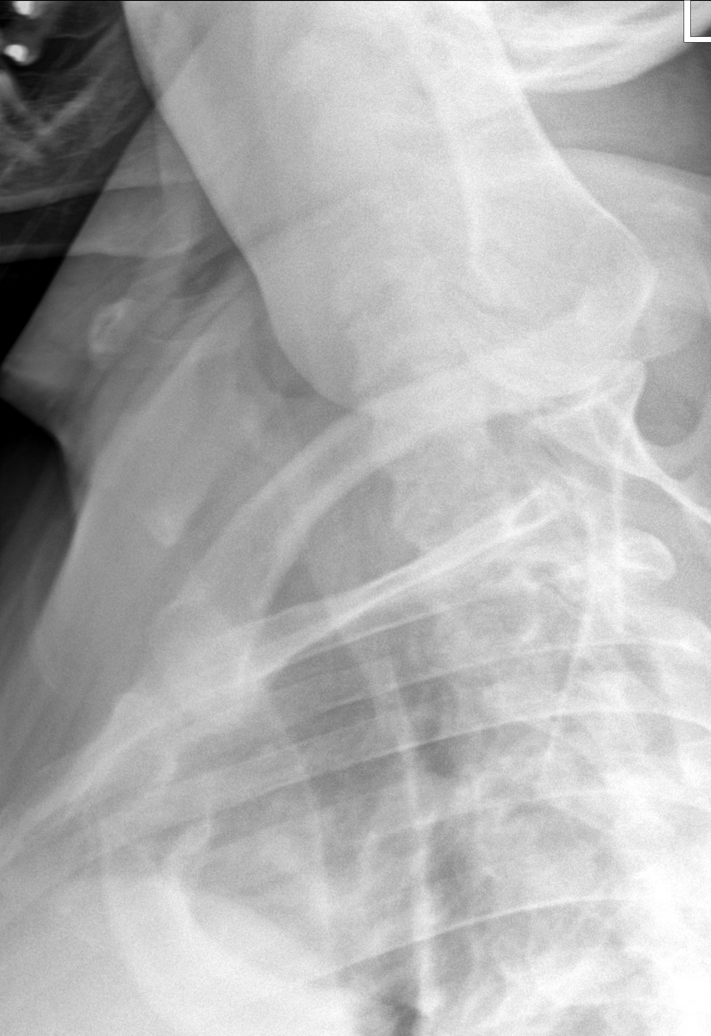

[6 of 6 positions shown; findings below may reference images not displayed]

FINDINGS: On the lateral view the cervical spine is visualized to the level of
C6-7 with improved visualization of C7-T1 on the swimmer's view.
Straightening of the cervical spine. Pre-vertebral soft tissues are
within normal limits. No fracture is detected in the cervical spine.
Dens is well positioned between the lateral masses of C1. Moderate
multilevel cervical degenerative disc disease, most prominent at
C4-5 and C6-7. No subluxation. No significant facet arthropathy.
Moderate degenerative foraminal stenosis on the right at C3-4, C4-5
and C5-6 due to uncovertebral hypertrophy. Mild degenerative
foraminal stenosis on the left at C4-5 due to uncovertebral
hypertrophy. No aggressive-appearing focal osseous lesions.
IMPRESSION: 1. Moderate multilevel cervical degenerative disc disease, most
prominent at C4-5 and C6-7.
2. Mild-to-moderate bilateral degenerative foraminal stenosis in the
cervical spine as detailed, right greater than left.

## 2020-01-05 MED ORDER — PREDNISONE 20 MG PO TABS: ORAL_TABLET | ORAL | 0 refills | Status: DC

## 2020-01-05 MED ORDER — MELOXICAM 15 MG PO TABS: 15.0000 mg | ORAL_TABLET | Freq: Every day | ORAL | 0 refills | Status: DC

## 2020-01-05 NOTE — Patient Instructions (Signed)
Cervical Radiculopathy  Cervical radiculopathy happens when a nerve in the neck (a cervical nerve) is pinched or bruised. This condition can happen because of an injury to the cervical spine (vertebrae) in the neck, or as part of the normal aging process. Pressure on the cervical nerves can cause pain or numbness that travels from the neck all the way down into the arm and fingers. Usually, this condition gets better with rest. Treatment may be needed if the condition does not improve. What are the causes? This condition may be caused by:  A neck injury.  A bulging (herniated) disk.  Muscle spasms.  Muscle tightness in the neck because of overuse.  Arthritis.  Breakdown or degeneration in the bones and joints of the spine (spondylosis) due to aging.  Bone spurs that may develop near the cervical nerves. What are the signs or symptoms? Symptoms of this condition include:  Pain. The pain may travel from the neck to the arm and hand. The pain can be severe or irritating. It may be worse when you move your neck.  Numbness or tingling in your arm or hand.  Weakness in the affected arm and hand, in severe cases. How is this diagnosed? This condition may be diagnosed based on your symptoms, your medical history, and a physical exam. You may also have tests, including:  X-rays.  A CT scan.  An MRI.  An electromyogram (EMG).  Nerve conduction tests. How is this treated? In many cases, treatment is not needed for this condition. With rest, the condition usually gets better over time. If treatment is needed, options may include:  Wearing a soft neck collar (cervical collar) for short periods of time, as told by your health care provider.  Doing physical therapy to strengthen your neck muscles.  Taking medicines, such as NSAIDs or oral corticosteroids.  Having spinal injections, in severe cases.  Having surgery. This may be needed if other treatments do not help. Different types  of surgery may be done depending on the cause of this condition. Follow these instructions at home: If you have a cervical collar:  Wear it as told by your health care provider. Remove it only as told by your health care provider.  Ask your health care provider if you can remove the collar for cleaning and bathing. If you are allowed to remove the collar for cleaning or bathing: ? Follow instructions from your health care provider about how to remove the collar safely. ? Clean the collar by wiping it with mild soap and water and drying it completely. ? Take out any removable pads in the collar every 1-2 days, and wash them by hand with soap and water. Let them air-dry completely before you put them back in the collar. ? Check your skin under the collar for irritation or sores. If you see any, tell your health care provider. Managing pain      Take over-the-counter and prescription medicines only as told by your health care provider.  If directed, put ice on the affected area. ? If you have a soft neck collar, remove it as told by your health care provider. ? Put ice in a plastic bag. ? Place a towel between your skin and the bag. ? Leave the ice on for 20 minutes, 2-3 times a day.  If applying ice does not help, you can try using heat. Use the heat source that your health care provider recommends, such as a moist heat pack or a heating pad. ?  Place a towel between your skin and the heat source. ? Leave the heat on for 20-30 minutes. ? Remove the heat if your skin turns bright red. This is especially important if you are unable to feel pain, heat, or cold. You may have a greater risk of getting burned.  Try a gentle neck and shoulder massage to help relieve symptoms. Activity  Rest as needed.  Return to your normal activities as told by your health care provider. Ask your health care provider what activities are safe for you.  Do stretching and strengthening exercises as told by  your health care provider or physical therapist.  Do not lift anything that is heavier than 10 lb (4.5 kg) until your health care provider tells you that it is safe. General instructions  Use a flat pillow when you sleep.  Do not drive while wearing a cervical collar. If you do not have a cervical collar, ask your health care provider if it is safe to drive while your neck heals.  Ask your health care provider if the medicine prescribed to you requires you to avoid driving or using heavy machinery.  Do not use any products that contain nicotine or tobacco, such as cigarettes, e-cigarettes, and chewing tobacco. These can delay healing. If you need help quitting, ask your health care provider.  Keep all follow-up visits as told by your health care provider. This is important. Contact a health care provider if:  Your condition does not improve with treatment. Get help right away if:  Your pain gets much worse and cannot be controlled with medicines.  You have weakness or numbness in your hand, arm, face, or leg.  You have a high fever.  You have a stiff, rigid neck.  You lose control of your bowels or your bladder (have incontinence).  You have trouble with walking, balance, or speaking. Summary  Cervical radiculopathy happens when a nerve in the neck is pinched or bruised.  A nerve can get pinched from a bulging disk, arthritis, muscle spasms, or an injury to the neck.  Symptoms include pain, tingling, or numbness radiating from the neck into the arm or hand. Weakness can also occur in severe cases.  Treatment may include rest, wearing a cervical collar, and physical therapy. Medicines may be prescribed to help with pain. In severe cases, injections or surgery may be needed. This information is not intended to replace advice given to you by your health care provider. Make sure you discuss any questions you have with your health care provider. Document Revised: 01/11/2018  Document Reviewed: 01/11/2018 Elsevier Patient Education  2020 [employer].

## 2020-01-05 NOTE — Progress Notes (Signed)
Subjective: CC: neck pain PCP: Dettinger, Micheal Kaufmann, MD  KDX:[id number] Micheal Ross. is a 59 y.o. male presenting to clinic today for:  1. Neck pain [name] reports pain on the top of his right shoulder up along his neck for the last 4 months. The pain in his shoulder and neck feels like pins and needles. The pain has gotten worse over the last 4 months. The pain is constant.  The pain is a 7/10. He has been taking ibuprofen 800 mg with a little relief. Laying down makes the pain worse. He also starting have sharp pain in his left pinky, ring, and middle finger about a week ago. He denies injury, warmth, swelling, headaches, warmth, or tenderness.    Relevant past medical, surgical, family, and social history reviewed and updated as indicated.  Allergies and medications reviewed and updated.  No Known Allergies Past Medical History:  Diagnosis Date  . 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 Hypercholesterolemia   . Hypertension   . Sleep apnea    set to get tested soon 01/20/19    Current Outpatient Medications:  .  aspirin EC 81 MG tablet, Take 81 mg by mouth every other day. Swallow whole., Disp: , Rfl:  .  fluticasone (FLONASE) 50 MCG/ACT nasal spray, Place 1 spray into both nostrils 2 (two) times daily as needed for allergies or rhinitis., Disp: 16 g, Rfl: 6 .  ibuprofen (ADVIL) 800 MG tablet, Take 800 mg by mouth as needed., Disp: , Rfl:  .  lisinopril-hydrochlorothiazide (ZESTORETIC) 20-25 MG tablet, Take 1.5 tablets by mouth daily., Disp: 135 tablet, Rfl: 3 .  lovastatin (MEVACOR) 40 MG tablet, Take 1 tablet (40 mg total) by mouth every morning., Disp: 90 tablet, Rfl: 3 .  Multiple Vitamins-Minerals (ONE-A-DAY MENS 50+ ADVANTAGE) TABS, Take 1 tablet by mouth daily., Disp: , Rfl:  Social History   Socioeconomic History  . Marital status: Married    Spouse name: Not on file  . Number of children: Not on file  . Years of education: Not on file  . Highest  education level: Not on file  Occupational History  . Not on file  Tobacco Use  . Smoking status: Former Smoker    Packs/day: 0.50    Years: 2.00    Pack years: 1.00    Quit date: 03/30/1983    Years since quitting: 36.7  . Smokeless tobacco: Never Used  Vaping Use  . Vaping Use: Never used  Substance and Sexual Activity  . Alcohol use: Yes    Comment: occ  . Drug use: No  . Sexual activity: Yes    Comment: married for 1985, 6 grown kids  Other Topics Concern  . Not on file  Social History Narrative  . Not on file   Social Determinants of Health   Financial Resource Strain:   . Difficulty of Paying Living Expenses: Not on file  Food Insecurity:   . Worried About Charity fundraiser in the Last Year: Not on file  . Ran Out of Food in the Last Year: Not on file  Transportation Needs:   . Lack of Transportation (Medical): Not on file  . Lack of Transportation (Non-Medical): Not on file  Physical Activity:   . Days of Exercise per Week: Not on file  . Minutes of Exercise per Session: Not on file  Stress:   . Feeling of Stress : Not on file  Social Connections:   . Frequency  of Communication with Friends and Family: Not on file  . Frequency of Social Gatherings with Friends and Family: Not on file  . Attends Religious Services: Not on file  . Active Member of Clubs or Organizations: Not on file  . Attends Archivist Meetings: Not on file  . Marital Status: Not on file  Intimate Partner Violence:   . Fear of Current or Ex-Partner: Not on file  . Emotionally Abused: Not on file  . Physically Abused: Not on file  . Sexually Abused: Not on file   Family History  Problem Relation Age of Onset  . Thyroid disease Mother   . COPD Father   . Cancer Maternal Aunt   . Cancer Maternal Uncle        back  . Cancer Paternal Uncle        throat   . Cancer Cousin        prostate  . Colon cancer Neg Hx   . Colon polyps Neg Hx     Review of Systems  Per  HPI.  Objective: Office vital signs reviewed. BP 114/77   Pulse 86   Temp 98.1 F (36.7 C) (Temporal)   Ht 5\' 10"  (1.778 m)   Wt (!) 322 lb 6 oz (146.2 kg)   BMI 46.26 kg/m   Physical Examination:  Physical Exam Vitals and nursing note reviewed.  Constitutional:      General: He is not in acute distress.    Appearance: Normal appearance. He is not ill-appearing, toxic-appearing or diaphoretic.  HENT:     Head: Normocephalic and atraumatic.  Neck:   Pulmonary:     Effort: Pulmonary effort is normal. No respiratory distress.  Musculoskeletal:     Right shoulder: No swelling, effusion, tenderness, bony tenderness or crepitus. Normal range of motion. Normal strength.     Cervical back: Normal range of motion and neck supple. No edema, erythema, rigidity or crepitus. Muscular tenderness present. No pain with movement or spinous process tenderness. Normal range of motion.     Comments: Negative empty can test.   Skin:    General: Skin is dry.  Neurological:     General: No focal deficit present.     Mental Status: He is alert and oriented to person, place, and time.  Psychiatric:        Mood and Affect: Mood normal.        Behavior: Behavior normal.      Results for orders placed or performed in visit on 11/17/19  Novel Coronavirus, NAA ([facility])   Specimen: Nasopharyngeal(NP) swabs in vial transport medium  Result Value Ref Range   SARS-CoV-2, NAA Not Detected Not Detected  SARS-COV-2, NAA 2 DAY TAT  Result Value Ref Range   SARS-CoV-2, NAA 2 DAY TAT Performed      Assessment/ Plan: [name] was seen today for neck pain.  Diagnoses and all orders for this visit:  Neck pain Cervical spine Xray today in office, radiology report pending. Likely cervical radiculopathy. Discontinue ibuprofen, start Mobic. Prednisone burst. PT referral. If not improvement, he may need further imaging and/or referral to ortho.  -     DG Cervical Spine Complete; Future -     predniSONE  (DELTASONE) 20 MG tablet; 2 po at sametime daily for 5 days -     meloxicam (MOBIC) 15 MG tablet; Take 1 tablet (15 mg total) by mouth daily. -     Ambulatory referral to Physical Therapy  Return to office for  new or worsening symptoms, or if symptoms persist.   The above assessment and management plan was discussed with the patient. The patient verbalized understanding of and has agreed to the management plan. Patient is aware to call the clinic if symptoms persist or worsen. Patient is aware when to return to the clinic for a follow-up visit. Patient educated on when it is appropriate to go to the emergency department.   Marjorie Smolder, FNP-C [facility] [address] [place], [place] [address] [phone]

## 2020-01-06 NOTE — Progress Notes (Signed)
Patient notified

## 2020-01-07 ENCOUNTER — Telehealth: Payer: Self-pay | Admitting: Family Medicine

## 2020-01-14 ENCOUNTER — Telehealth: Payer: Self-pay

## 2020-01-14 NOTE — Telephone Encounter (Signed)
Typically we like people to wait a week or 2 after taking prednisone before getting vaccinations because it decreases their effectiveness so I would wait until he is done with the prednisone for at least a week or 2

## 2020-01-14 NOTE — Telephone Encounter (Signed)
Patient states that he completed prednisone on Friday 11/5.  Advised patient he will need to wait 2 week of completing therapy before receiving COVID booster

## 2020-01-14 NOTE — Telephone Encounter (Signed)
Pt called stating that he was seen on 01/05/20 for neck pain and is currently taking some medicine to help with that. Wants to know if it is ok for him to get his covid booster shot while taking the medicine.  Please advise. [phone]

## 2020-01-16 ENCOUNTER — Telehealth: Payer: Self-pay

## 2020-01-16 NOTE — Telephone Encounter (Signed)
Pt says that [facility] told him to call if meloxicam (MOBIC) 15 MG tablet rx is not working. He wants to move forward with MRI and the shot. Please call back  Referral information below in case referral is placed  REFERRAL REQUEST Telephone Note  Have you been seen at our office for this problem? yes (Advise that they may need an appointment with their PCP before a referral can be done)  Reason for Referral: neck and shoulder pain Referral discussed with patient: yes Best contact number of patient for referral team:   [phone] Has patient been seen by a specialist for this issue before: he had an MRI about 5 years for something though Patient provider preference for referral:  Patient location preference for referral: [facility] [place]   Patient notified that referrals can take up to a week or longer to process. If they haven't heard anything within a week they should call back and speak with the referral department.

## 2020-01-16 NOTE — Telephone Encounter (Signed)
MRI will not be covered by insurance until 6 weeks of conservative therapy like PT. I can place a referral to ortho or sports medicine for him. Does he have a preference for the location?

## 2020-01-16 NOTE — Telephone Encounter (Signed)
Patient states he has not started PT yet but he will just start that and see how it goes.

## 2020-01-22 ENCOUNTER — Telehealth: Payer: Self-pay

## 2020-01-22 NOTE — Telephone Encounter (Signed)
Yes that is fine to go ahead and get the booster, just the prednisone that causes the issue not the meloxicam,

## 2020-01-22 NOTE — Telephone Encounter (Signed)
Patient aware and verbalizes understanding. 

## 2020-01-27 ENCOUNTER — Encounter (HOSPITAL_COMMUNITY): Payer: Self-pay | Admitting: Physical Therapy

## 2020-01-27 ENCOUNTER — Ambulatory Visit (HOSPITAL_COMMUNITY): Payer: 59 | Attending: Family Medicine | Admitting: Physical Therapy

## 2020-01-27 ENCOUNTER — Other Ambulatory Visit: Payer: Self-pay

## 2020-01-27 DIAGNOSIS — M542 Cervicalgia: Secondary | ICD-10-CM | POA: Diagnosis not present

## 2020-01-27 DIAGNOSIS — R293 Abnormal posture: Secondary | ICD-10-CM | POA: Insufficient documentation

## 2020-01-27 DIAGNOSIS — M25511 Pain in right shoulder: Secondary | ICD-10-CM | POA: Diagnosis present

## 2020-01-27 NOTE — Therapy (Signed)
[facility] [address] [place], [place], [address] Phone: [phone]   Fax:  [phone]  Physical Therapy Evaluation  Patient Details  Name: Micheal Ross. MRN: [id number] Date of Birth: 1961-01-15 Referring Provider (PT): Gwenlyn Perking   Encounter Date: 01/27/2020   PT End of Session - 01/27/20 0841    Visit Number 1    Number of Visits 12    Date for PT Re-Evaluation 03/23/20    Authorization Type [employer], VL 30 0 required auth, VL 30    Authorization - Visit Number 1    Authorization - Number of Visits 30    Progress Note Due on Visit 10    PT Start Time 0845    PT Stop Time 0935    PT Time Calculation (min) 50 min    Activity Tolerance Patient tolerated treatment well    Behavior During Therapy WFL for tasks assessed/performed           Past Medical History:  Diagnosis Date   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 Repeat in 4 months. path tubulovillous adenoma, negative for high-grade dysplasia   COLONOSCOPY WITH PROPOFOL N/A 01/20/2019   Procedure: COLONOSCOPY WITH PROPOFOL;  Surgeon: Danie Binder, MD; large defect (2 cm x 1.5 cm with small visible vessel with stigmata of recent bleed) in the cecum.  3 hemostatic clips placed (MR conditional).  One 3 mm polyp in the cecum, tortuous colon, external and internal hemorrhoids.  Pathology revealed tubular adenoma.   HERNIA REPAIR  [id number]   umbilical   POLYPECTOMY  01/13/2019   Procedure: POLYPECTOMY;  Surgeon: Daneil Dolin, MD;  [facility];  Service: Endoscopy;;  Large Cecal Polyp     There were no vitals filed for this visit.    Subjective Assessment - 01/27/20 0942    Subjective States he started having pain in his right shoulder about 4 months ago. States that it is starting to to feel it in his left shoulder too. States that he has tingling in his neck and right is worse then left. States he the MD took an xray and there is a bone. States he doesnt have a follow up apt. States that he hasnt had an MRI until he has had at least 6 weeks of therapy. States he has been on steroids and has been muscle relaxers with no benefits. States that he was having some symptoms in his left hand. Current pain in right shoulder is 4/10, states it hurts ,more at the end of the day. States mornings are better. States when he prays at the end of the day the pressure goes up to his shoulder. 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 States that he has his right arm elevated during the day (shoulder abd and IR) while driving and that increases his symptoms. Hugging himself takes his pain away in his arm. States he has constant symptoms in his right shoulder (UT region) as numbness and tingling. States  pain was first in shoulder joint. States he puts rubbing alcohol on his right shoulder and that helps a bit. States when he sleeps on his right side his right arm is more uncomfortable.    Pertinent History HTN, brain tumor removal 2014    Limitations Sitting;House hold activities;Lifting    Diagnostic tests xray    Patient Stated Goals to have less pain    Currently in Pain? Yes    Pain Score 4     Pain Location Shoulder    Pain Orientation Right;Anterior;Upper    Pain Descriptors / Indicators Aching;Sharp;Sore;Pins and needles;Tender    Pain Type Acute pain    Pain Radiating Towards up into neck and down into arm    Pain Onset More than a month ago    Aggravating Factors  shoulder ROM, neck ROM, end of the day    Pain Relieving Factors shulder  add/IR (hugging self to unweight arm)    Effect of Pain on Daily Activities difficulty with work              [facility] PT Assessment - 01/27/20 0001      Assessment   Medical Diagnosis neck and shoulder pain    Referring Provider (PT) Gwenlyn Perking    Next MD Visit none scheduled     Prior Therapy after tumor removed in 2014.       Balance Screen   Has the patient fallen in the past 6 months No      Observation/Other Assessments   Observations holds head in right SB and left ROT, rocking side to side (trunk) helps with symptoms.; Cervical spine stuck in excessive lordosis    Focus on Therapeutic Outcomes (FOTO)  54% function       ROM / Strength   AROM / PROM / Strength AROM;Strength      AROM   AROM Assessment Site Shoulder;Cervical    Right/Left Shoulder Right;Left    Right Shoulder Flexion 130 Degrees   pain in right shoulder ( not as bad as abd)   Right Shoulder ABduction 130 Degrees   pain in right shoulder   Right Shoulder Internal Rotation --   L1 increased in right shoulder symptoms.    Right Shoulder External Rotation --   C7 SP no increase in symptoms   Left Shoulder Flexion 140 Degrees   no pain noted    Left Shoulder ABduction 135 Degrees    Left Shoulder Internal Rotation --   L1 SP no increase in symptoms   Left Shoulder External Rotation --   C7 SP no increase in symptoms   Cervical Flexion 45   no change in symptoms   Cervical Extension 38   no change in symptoms    Cervical - Right Side Bend 18   increase in pain in UT on Right   Cervical - Left Side Bend 30   no change in symptoms   Cervical - Right Rotation 55   pain along right side of neck    Cervical - Left Rotation 50   no change in symptoms      Palpation   Spinal mobility hypomobility noted with PA and medial glides throughout cervical spine    Palpation comment tenderness to light touch along right SCM, UT, anterior scalenes and cervical paraspinals      Special Tests   Other special tests  reduced symptoms with traction (stretch, no improvement in radicular symptoms)  Objective measurements completed on examination: See above findings.       [facility] PT Treatment/Exercise - 01/27/20 0001      Exercises   Exercises Neck      Neck Exercises: Supine   Neck Retraction --   10 min - tactile cues, with cervical lengthening/flexion                  PT Education - 01/27/20 0941    Education Details on anatomy, degeneration and normal spinal changes, current presentation and POC    Person(s) Educated Patient    Methods Explanation    Comprehension Verbalized understanding            PT Short Term Goals - 01/27/20 0939      PT SHORT TERM GOAL #1   Title Patient will be independent in self management strategies to improve quality of life and functional outcomes.    Time 4    Period Weeks    Status New    Target Date 02/24/20      PT SHORT TERM GOAL #2   Title Patient will report at least 25% improvement in overall symptoms and/or function to demonstrate improved functional mobility    Time 4    Period Weeks    Status New    Target Date 02/24/20      PT SHORT TERM GOAL #3   Title Patient will be able to demonstrate active shoulder ROM without increase in pain in right shoulder    Time 4    Period Weeks    Status New    Target Date 02/24/20             PT Long Term Goals - 01/27/20 0940      PT LONG TERM GOAL #1   Title Patient will be able to demonstrate cervical ROM without increase in neck or shoulder symptoms    Time 8    Period Weeks    Status New    Target Date 03/23/20      PT LONG TERM GOAL #2   Title Patient will improve on FOTO score to meet predicted outcomes to demonstrate improved functional mobility.    Time 8    Period Weeks    Status New    Target Date 03/23/20      PT LONG TERM GOAL #3   Title Patient will report at least 50% improvement in overall symptoms and/or function to  demonstrate improved functional mobility    Time 8    Period Weeks    Status New    Target Date 03/23/20                  Plan - 01/27/20 0943    Clinical Impression Statement Patient presents to therapy with complaints of right shoulder pain radiating up into his neck that started 4 months ago. Patient with signs and symptoms consistent with postural and muscular dysfunction. Tolerated postural exercise well with reduced symptoms in shoulder noted, which was the first time in 4 months. Heavy education on anatomy and how PT will help with current condition. Patient would greatly benefit from skilled physical therapy to improve functional mobility and quality of life.    Personal Factors and Comorbidities Comorbidity 1;Comorbidity 2    Comorbidities HTN    Examination-Activity Limitations Reach Overhead;Bed Mobility;Lift    Examination-Participation Restrictions Driving;Occupation    Stability/Clinical Decision Making Stable/Uncomplicated    Clinical Decision Making Low    Rehab Potential Good  PT Frequency Other (comment)   1-2x/week for total of 12 visits over 8 week certification   PT Duration 8 weeks    PT Treatment/Interventions ADLs/Self Care Home Management;Cryotherapy;Electrical Stimulation;Moist Heat;Traction;Therapeutic exercise;Therapeutic activities;Manual techniques;Neuromuscular re-education;Patient/family education;Joint Manipulations    PT Next Visit Plan f/u with chin tuck, traction, protraction with lower rib closure, cervical mobilizations, assess t-spine    PT Home Exercise Plan chin tuck with cervical lengthening    Consulted and Agree with Plan of Care Patient           Patient will benefit from skilled therapeutic intervention in order to improve the following deficits and impairments:  Pain, Decreased activity tolerance, Postural dysfunction, Decreased range of motion, Decreased strength, Impaired UE functional use  Visit Diagnosis: Neck  pain  Abnormal posture  Acute pain of right shoulder     Problem List Patient Active Problem List   Diagnosis Date Noted   Bee sting 12/05/2019   Obstructive sleep apnea 03/31/2019   Acute blood loss anemia 01/20/2019   Tachycardia 01/20/2019   Lower GI bleed    Weakness of both lower extremities 01/15/2019   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 [place], [place], [address] Phone: [phone]   Fax:  [phone]  Name: Micheal Ross. MRN: [id number] Date of Birth: 10/29/1960

## 2020-02-02 ENCOUNTER — Ambulatory Visit (HOSPITAL_COMMUNITY): Payer: 59 | Admitting: Physical Therapy

## 2020-02-02 ENCOUNTER — Telehealth (HOSPITAL_COMMUNITY): Payer: Self-pay | Admitting: Physical Therapy

## 2020-02-02 NOTE — Telephone Encounter (Signed)
pt lmonvm to cx today's appt will cal back to reschedule.

## 2020-02-04 ENCOUNTER — Telehealth (HOSPITAL_COMMUNITY): Payer: Self-pay | Admitting: Physical Therapy

## 2020-02-04 ENCOUNTER — Telehealth: Payer: Self-pay | Admitting: Family Medicine

## 2020-02-04 ENCOUNTER — Ambulatory Visit (HOSPITAL_COMMUNITY): Payer: 59 | Admitting: Physical Therapy

## 2020-02-04 NOTE — Telephone Encounter (Signed)
Pt is having swelling and pain in shoulder and neck when sits straight up, is unsure if this has to do with arthritis or if it is something more serious. Would like to come in for blood work

## 2020-02-04 NOTE — Telephone Encounter (Signed)
pt called to cx all the rest of his appts and he stated that he will call us back when he is ready to reschedule.

## 2020-02-04 NOTE — Telephone Encounter (Signed)
Attempted to contact patient - NA °

## 2020-02-04 NOTE — Telephone Encounter (Signed)
If patient is still having issues and these medicines did not help the please have him return for an office visit.

## 2020-02-04 NOTE — Telephone Encounter (Signed)
The pt called to cancel all of his remaining appts and wants this referral to be closed. he may need to be discharged.

## 2020-02-04 NOTE — Telephone Encounter (Signed)
Patient had an appointment with Tiffany on 01/05/20 for neck pain and swelling.  He was given meloxicam and prednisone.  States the meloxicam only helped him sleep at night and the prednisone did not help any.  Please advise what the next steps should be. PCP

## 2020-02-11 ENCOUNTER — Ambulatory Visit (HOSPITAL_COMMUNITY): Payer: 59 | Admitting: Physical Therapy

## 2020-02-22 ENCOUNTER — Encounter (HOSPITAL_COMMUNITY): Payer: Self-pay | Admitting: Emergency Medicine

## 2020-02-22 ENCOUNTER — Emergency Department (HOSPITAL_COMMUNITY)
Admission: EM | Admit: 2020-02-22 | Discharge: 2020-02-23 | Disposition: A | Discharge status: Home / Self Care | Payer: 59 | Attending: Emergency Medicine | Admitting: Emergency Medicine

## 2020-02-22 ENCOUNTER — Other Ambulatory Visit: Payer: Self-pay

## 2020-02-22 DIAGNOSIS — M4722 Other spondylosis with radiculopathy, cervical region: Secondary | ICD-10-CM

## 2020-02-22 DIAGNOSIS — I10 Essential (primary) hypertension: Secondary | ICD-10-CM | POA: Diagnosis not present

## 2020-02-22 DIAGNOSIS — Z7982 Long term (current) use of aspirin: Secondary | ICD-10-CM | POA: Diagnosis not present

## 2020-02-22 DIAGNOSIS — R519 Headache, unspecified: Secondary | ICD-10-CM | POA: Insufficient documentation

## 2020-02-22 DIAGNOSIS — M5013 Cervical disc disorder with radiculopathy, cervicothoracic region: Secondary | ICD-10-CM | POA: Insufficient documentation

## 2020-02-22 DIAGNOSIS — Z87891 Personal history of nicotine dependence: Secondary | ICD-10-CM | POA: Insufficient documentation

## 2020-02-22 DIAGNOSIS — R200 Anesthesia of skin: Secondary | ICD-10-CM | POA: Diagnosis not present

## 2020-02-22 DIAGNOSIS — Z79899 Other long term (current) drug therapy: Secondary | ICD-10-CM | POA: Diagnosis not present

## 2020-02-22 DIAGNOSIS — M25511 Pain in right shoulder: Secondary | ICD-10-CM | POA: Diagnosis present

## 2020-02-22 HISTORY — DX: Cervicalgia: M54.2

## 2020-02-22 HISTORY — DX: Pain in unspecified shoulder: M25.519

## 2020-02-22 NOTE — ED Triage Notes (Signed)
Pt c/o bilateral shoulder pain that he describes as pins and needles.  Pt states the pain runs up the left side of his neck.  Pt states he had brain surgery to remove a tumor in 2014 and he has not had any follow since 2016.

## 2020-02-23 ENCOUNTER — Telehealth: Payer: Self-pay | Admitting: *Deleted

## 2020-02-23 LAB — CBG MONITORING, ED: Glucose-Capillary: 106 mg/dL — ABNORMAL HIGH (ref 70–99)

## 2020-02-23 MED ORDER — METHOCARBAMOL 500 MG PO TABS: ORAL_TABLET | ORAL | 0 refills | Status: DC

## 2020-02-23 MED ORDER — KETOROLAC TROMETHAMINE 30 MG/ML IJ SOLN
30.0000 mg | Freq: Once | INTRAMUSCULAR | Status: AC
  Administered 2020-02-23: 02:00:00 30 mg via INTRAMUSCULAR
  Filled 2020-02-23: qty 1

## 2020-02-23 MED ORDER — NAPROXEN SODIUM 550 MG PO TABS: ORAL_TABLET | ORAL | 0 refills | Status: DC

## 2020-02-23 MED ORDER — DEXAMETHASONE SODIUM PHOSPHATE 10 MG/ML IJ SOLN
10.0000 mg | Freq: Once | INTRAMUSCULAR | Status: AC
  Administered 2020-02-23: 02:00:00 10 mg via INTRAMUSCULAR
  Filled 2020-02-23: qty 1

## 2020-02-23 MED ORDER — PREDNISONE 20 MG PO TABS: ORAL_TABLET | ORAL | 0 refills | Status: DC

## 2020-02-23 NOTE — Discharge Instructions (Signed)
Use ice and heat for comfort.  Take the medication as prescribed.  Please call [phone] after 8:30 in the morning to schedule your MRI of your brain and cervical spine.  Please follow-up with your primary care doctor if your symptoms are not improving.

## 2020-02-23 NOTE — Telephone Encounter (Signed)
Patient states that referral was placed at the ER.

## 2020-02-23 NOTE — ED Provider Notes (Signed)
[facility] EMERGENCY DEPARTMENT Provider Note   CSN: [id number] Arrival date & time: 02/22/20  2024   Time seen 1:12 AM  History Chief Complaint  Patient presents with  . Shoulder Pain    Micheal Shanta Dorvil. is a 59 y.o. male.  HPI   Patient states about 4 months ago he started having pain in the muscle between his right neck and shoulder and about 1-1/2 weeks ago he started having pain on the left side with pins-and-needles sticking him in his left hand.  Patient is right-handed.  He states he has never had anything like this before.  He has no involvement of his legs or incontinence.  He has seen his primary care doctor and states about 4 weeks ago he was placed on steroids for about 5 days and a muscle relaxer without relief.  He states sleeping on his sides makes the pain worse and sitting still in 1 position too long makes it worse.  He states turning his head to the left makes it hurt more on the right side of his neck.  He also states he started having a left-sided headache and he is concerned because he had a tumor removed there in [place] in 2014.  He states he took ibuprofen this morning, December 19, December 18 with mild improvement.  PCP Dettinger, Fransisca Kaufmann, MD   Past Medical History:  Diagnosis Date  . 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 Hypercholesterolemia   . Hypertension   . Neck pain   . Shoulder pain   . Sleep apnea    set to get tested soon 01/20/19    Patient Active Problem List   Diagnosis Date Noted  . Bee sting 12/05/2019  . Obstructive sleep apnea 03/31/2019  . Acute blood loss anemia 01/20/2019  . Tachycardia 01/20/2019  . Lower GI bleed   . Weakness of both lower extremities 01/15/2019  . 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 Rectal bleeding 01/15/2019  . Gastroesophageal reflux disease 01/15/2019  . Shortness of breath 01/15/2019  . History of colonic polyps 11/14/2018  . Hypertension 03/29/2018  . Hyperlipidemia  03/29/2018  . Influenza A 04/20/2017  . Meningioma ([place]) 09/20/2012    Past Surgical History:  Procedure Laterality Date  . ANKLE SURGERY    . brain tumor removal  2014  . COLONOSCOPY WITH PROPOFOL N/A 01/13/2019   Procedure: COLONOSCOPY WITH PROPOFOL;  Surgeon: Daneil Dolin, MD;  20 mm polyp found in the cecum that was removed via piecemeal hot snare polypectomy. Repeat in 4 months. 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 COLONOSCOPY WITH PROPOFOL N/A 01/20/2019   Procedure: COLONOSCOPY WITH PROPOFOL;  Surgeon: Danie Binder, MD; large defect (2 cm x 1.5 cm with small visible vessel with stigmata of recent bleed) in the cecum.  3 hemostatic clips placed (MR conditional).  One 3 mm polyp in the cecum, tortuous colon, external and internal hemorrhoids.  Pathology revealed tubular adenoma.  [name] Kitchen HERNIA REPAIR  [id number]   umbilical  . POLYPECTOMY  01/13/2019   Procedure: POLYPECTOMY;  Surgeon: Daneil Dolin, MD;  Location[facility];  Service: Endoscopy;;  Large Cecal Polyp        Family History  Problem Relation Age of Onset  . Thyroid disease Mother   . COPD Father   . Cancer Maternal Aunt   . Cancer Maternal Uncle        back  . Cancer Paternal Uncle  throat   . Cancer Cousin        prostate  . Colon cancer Neg Hx   . Colon polyps Neg Hx     Social History   Tobacco Use  . Smoking status: Former Smoker    Packs/day: 0.50    Years: 2.00    Pack years: 1.00    Quit date: 03/30/1983    Years since quitting: 36.9  . Smokeless tobacco: Never Used  Vaping Use  . Vaping Use: Never used  Substance Use Topics  . Alcohol use: Yes    Comment: occ  . Drug use: No  Drives a school bus  Home Medications Prior to Admission medications   Medication Sig Start Date End Date Taking? Authorizing Provider  aspirin EC 81 MG tablet Take 81 mg by mouth every other day. Swallow whole.    [provider]  fluticasone (FLONASE) 50 MCG/ACT nasal  spray Place 1 spray into both nostrils 2 (two) times daily as needed for allergies or rhinitis. 11/13/19   Dettinger, Fransisca Kaufmann, MD  lisinopril-hydrochlorothiazide (ZESTORETIC) 20-25 MG tablet Take 1.5 tablets by mouth daily. 03/31/19   Dettinger, Fransisca Kaufmann, MD  lovastatin (MEVACOR) 40 MG tablet Take 1 tablet (40 mg total) by mouth every morning. 04/01/19   Dettinger, Fransisca Kaufmann, MD  meloxicam (MOBIC) 15 MG tablet Take 1 tablet (15 mg total) by mouth daily. 01/05/20   Gwenlyn Perking, FNP  methocarbamol (ROBAXIN) 500 MG tablet Take 1 or 2 po Q 6hrs for muscle  pain 02/23/20   Micheal Porter, MD  Multiple Vitamins-Minerals (ONE-A-DAY MENS 50+ ADVANTAGE) TABS Take 1 tablet by mouth daily.    [provider]  naproxen sodium (ANAPROX) 550 MG tablet Take 1 po BID with food prn pain 02/23/20   Micheal Porter, MD  predniSONE (DELTASONE) 20 MG tablet Take 3 po QD x 3d , then 2 po QD x 3d then 1 po QD x 3d 02/23/20   Micheal Porter, MD    Allergies    Patient has no known allergies.  Review of Systems   Review of Systems  All other systems reviewed and are negative.   Physical Exam Updated Vital Signs BP 132/88   Pulse 79   Temp 98 F (36.7 C) (Oral)   Resp 18   Ht 5\' 10"  (1.778 m)   Wt (!) 145.2 kg   SpO2 99%   BMI 45.92 kg/m   Physical Exam Vitals and nursing note reviewed.  Constitutional:      Appearance: Normal appearance. He is obese.  HENT:     Head: Normocephalic and atraumatic.     Comments: Patient has a well-healed scar on the left side of his scalp from his prior craniotomy.    Right Ear: External ear normal.     Left Ear: External ear normal.  Eyes:     Extraocular Movements: Extraocular movements intact.     Conjunctiva/sclera: Conjunctivae normal.     Pupils: Pupils are equal, round, and reactive to light.  Neck:      Comments: Nontender in the midline cervical spine.  He has tenderness over the trapezius muscle bilaterally but the right is worse than the left.  He has no  pain on abduction of his arms on the left but some tenderness in the trapezius muscle when he does it on the right.  He is right-handed but his right grip is mildly weaker than the left. Cardiovascular:     Rate and Rhythm: Normal  rate.  Pulmonary:     Effort: Pulmonary effort is normal. No respiratory distress.  Musculoskeletal:     Cervical back: Normal range of motion and neck supple. No tenderness.  Skin:    General: Skin is warm and dry.  Neurological:     General: No focal deficit present.     Mental Status: He is alert and oriented to person, place, and time.     Cranial Nerves: No cranial nerve deficit.  Psychiatric:        Mood and Affect: Mood normal.        Behavior: Behavior normal.        Thought Content: Thought content normal.     ED Results / Procedures / Treatments   Labs (all labs ordered are listed, but only abnormal results are displayed) Labs Reviewed  CBG MONITORING, ED - Abnormal; Notable for the following components:      Result Value   Glucose-Capillary 106 (*)    All other components within normal limits    EKG None  Radiology No results found.   Cervical spine x-rays January 05, 2020 IMPRESSION: 1. Moderate multilevel cervical degenerative disc disease, most prominent at C4-5 and C6-7. 2. Mild-to-moderate bilateral degenerative foraminal stenosis in the cervical spine as detailed, right greater than left.   Electronically Signed   By: Ilona Sorrel M.D.   On: 01/05/2020 16:40   Procedures Procedures (including critical care time)  Medications Ordered in ED Medications  ketorolac (TORADOL) 30 MG/ML injection 30 mg (30 mg Intramuscular Given 02/23/20 0139)  dexamethasone (DECADRON) injection 10 mg (10 mg Intramuscular Given 02/23/20 0139)    ED Course  I have reviewed the triage vital signs and the nursing notes.  Pertinent labs & imaging results that were available during my care of the patient were reviewed by me and considered  in my medical decision making (see chart for details).    MDM Rules/Calculators/A&P                          Patient has multiple concerns, he is concerned he may have had a stroke with the tingling in his left hand and the pain in the left side of his head where he had priors brain surgery from a benign brain tumor.  I explained to him that the right side of the brain controls the left side of the body so that would not fit.  Reviewing his prior x-ray shows he does have a lot of degenerative changes of his cervical spine.  Patient put him on meloxicam and steroids, I do not see a muscle relaxer.  He also had physical therapy evaluation done on November 23 and then he canceled all of his subsequent appointments.  They had made a plan for 8 weeks of therapy.  We discussed getting an MRI and he is agreeable.  It would be done as an outpatient, is not available here at night.   Final Clinical Impression(s) / ED Diagnoses Final diagnoses:  Cervical radiculopathy due to degenerative joint disease of spine  Left-sided headache    Rx / DC Orders ED Discharge Orders         Ordered    MR BRAIN WO CONTRAST        02/23/20 0127    MR CERVICAL SPINE WO CONTRAST        02/23/20 0127    naproxen sodium (ANAPROX) 550 MG tablet  02/23/20 0140    predniSONE (DELTASONE) 20 MG tablet        02/23/20 0140    methocarbamol (ROBAXIN) 500 MG tablet        02/23/20 0140         Plan discharge  Micheal Porter, MD, Barbette Or, MD 02/23/20 [phone]

## 2020-02-23 NOTE — Telephone Encounter (Signed)
Patient called stating that he has had worsening neck pain and headaches since last seen by Marjorie Smolder, FNP on 01/05/20.  Patient was seen in the ER last night and early this morning.  MRI was ordered by ER doctor but unable to obtain the images due ER not being able to do MRI images at night.  Patient was advised by ER doctor to have these images ordered by PCP today.  No availability

## 2020-02-23 NOTE — Telephone Encounter (Signed)
We are looking in this but there is no such protocol and we would not be able to justify the MRIs because we do not have any notes that would be able to be used for prior authorization.  Please schedule an appointment for the patient as soon as possible, I am off for a week so he will likely have to schedule with somebody else.

## 2020-02-25 ENCOUNTER — Encounter (HOSPITAL_COMMUNITY): Payer: 59 | Admitting: Physical Therapy

## 2020-02-26 ENCOUNTER — Other Ambulatory Visit: Payer: Self-pay

## 2020-02-26 ENCOUNTER — Ambulatory Visit (INDEPENDENT_AMBULATORY_CARE_PROVIDER_SITE_OTHER): Payer: 59 | Admitting: Family

## 2020-02-26 ENCOUNTER — Encounter: Payer: Self-pay | Admitting: Family

## 2020-02-26 VITALS — BP 130/89 | HR 87 | Temp 97.1°F | Ht 70.0 in | Wt 324.0 lb

## 2020-02-26 DIAGNOSIS — R200 Anesthesia of skin: Secondary | ICD-10-CM

## 2020-02-26 DIAGNOSIS — Z09 Encounter for follow-up examination after completed treatment for conditions other than malignant neoplasm: Secondary | ICD-10-CM

## 2020-02-26 DIAGNOSIS — G44201 Tension-type headache, unspecified, intractable: Secondary | ICD-10-CM

## 2020-02-26 DIAGNOSIS — M25511 Pain in right shoulder: Secondary | ICD-10-CM | POA: Diagnosis not present

## 2020-02-26 DIAGNOSIS — R202 Paresthesia of skin: Secondary | ICD-10-CM

## 2020-02-26 DIAGNOSIS — M4722 Other spondylosis with radiculopathy, cervical region: Secondary | ICD-10-CM | POA: Diagnosis not present

## 2020-02-26 DIAGNOSIS — D329 Benign neoplasm of meninges, unspecified: Secondary | ICD-10-CM

## 2020-02-26 NOTE — Patient Instructions (Signed)
Meningioma Meningioma is a tumor that occurs in the thin tissue that covers the brain and spinal cord (meninges). Meningiomas are usually not cancerous (benign) and do not spread to other areas. In rare cases, a meningioma may become cancerous (malignant). What are the causes? In many cases, the cause of this condition is not known. In some cases, meningioma may be caused by:  Having a genetic disorder that causes multiple soft tumors (neurofibromatosis 2).  A change in certain genes (genetic mutation). What increases the risk? You are more likely to develop this condition if:  You have been exposed to radiation.  You are an older woman. Older women have a higher risk of meningiomas than men or children. However, men have a higher risk of malignant meningiomas.  You have injured your head in the past.  You have a history of breast cancer. What are the signs or symptoms? Symptoms of this condition usually begin very slowly. The symptoms may depend on the size and location of the tumor. Possible symptoms include:  Headaches.  Nausea and vomiting.  Vision changes.  Hearing changes.  Loss of the sense of smell.  Fits of uncontrolled movements (seizures).  Weakness or numbness on one side of the body or in an arm or leg.  Mood or personality changes.  Problems with memory or thinking. How is this diagnosed? This condition is diagnosed based on:  Results of brain imaging tests, such as a CT scan or MRI.  Removal and testing of a sample of the tumor (biopsy). This may be done to confirm the diagnosis and to help determine the best treatment for the condition. How is this treated? You may not have treatment until your symptoms start to affect your daily activities. This is because meningioma grows so slowly, and your health care provider may prefer to monitor its growth before starting treatment. If you do need treatment, it may include:  Medicines to decrease brain swelling  and improve symptoms (steroids).  High-energy rays (radiation therapy) to shrink or kill the tumor.  Anti-cancer medicines (chemotherapy) to shrink or kill the tumor. Chemotherapy has many side effects because it also kills healthy cells.  Targeted therapy. This kills cancerous cells without affecting normal cells.  Surgery to remove as much of the tumor as possible. Follow these instructions at home:   Take over-the-counter and prescription medicines only as told by your health care provider.  Keep all follow-up visits as told by your health care provider. This is important. You may need regular visits to monitor the growth of your tumor. Contact a health care provider if:  You have symptoms that come back.  You have diarrhea.  You vomit.  You have abdominal pain.  You cannot eat or drink as much as you need.  You are weaker or more tired than usual.  You are losing weight without trying. Get help right away if:  Your diarrhea, vomiting, or abdominal pain does not go away.  You have new symptoms, such as vision problems or difficulty walking.  You have a seizure.  You have bleeding that does not stop.  You have trouble breathing.  You have a fever. Summary  Meningioma is a tumor that occurs in the thin tissue that covers the brain and spinal cord (meninges).  Meningiomas are usually benign, which means they are not cancerous and do not spread to other areas.  Symptoms of this condition usually begin very slowly. The symptoms may depend on the size and location of   the tumor.  Your tumor may be monitored over time. You may not need treatment until your tumor starts to affect your daily life. This information is not intended to replace advice given to you by your health care provider. Make sure you discuss any questions you have with your health care provider. Document Revised: 02/02/2017 Document Reviewed: 02/25/2016 Elsevier Patient Education  2020 Elsevier  Inc.   

## 2020-02-26 NOTE — Progress Notes (Signed)
Subjective:    Patient ID: Micheal Sabal., male    DOB: 05/13/1960, 59 y.o.   MRN: [id number]  Chief Complaint  Patient presents with  . ER F/U    Back pain, neck pain, shoulder and headache     HPI PT presents to the office today for ED follow up and right shoulder pain that radiates down to his hand.   He reports that about 4 months ago he started having constatn right shoulder pain that is aching pain of  8 out 10. He reports he has intermittent numbness and needle like pain in the tips of his left hand.   He reports a sharp tension pain in his temporal head that started 8-9 days ago. He reports the pain is constant. Denies any vision changes, gait change, or speech. He does report photosensitivity. Denies any fever, nausea and vomiting.    He had cervical x-ray on 01/05/20 that showed  IMPRESSION: 1. Moderate multilevel cervical degenerative disc disease, most prominent at C4-5 and C6-7. 2. Mild-to-moderate bilateral degenerative foraminal stenosis in the cervical spine as detailed, right greater than left.  He went to the ED on 02/22/20 and was given meloxicam and steroids. He went to PT once and states his pain was worse.   They recommended a MRI outpatient given his hx of meningioma. He had this removed 2014.     Review of Systems  All other systems reviewed and are negative.      Objective:   Physical Exam Vitals reviewed.  Constitutional:      General: He is not in acute distress.    Appearance: He is well-developed and well-nourished. He is obese.  HENT:     Head: Normocephalic.     Right Ear: Tympanic membrane normal.     Left Ear: Tympanic membrane normal.     Mouth/Throat:     Mouth: Oropharynx is clear and moist.  Eyes:     General:        Right eye: No discharge.        Left eye: No discharge.     Pupils: Pupils are equal, round, and reactive to light.  Neck:     Thyroid: No thyromegaly.  Cardiovascular:     Rate and Rhythm: Normal rate  and regular rhythm.     Pulses: Intact distal pulses.     Heart sounds: Normal heart sounds. No murmur heard.   Pulmonary:     Effort: Pulmonary effort is normal. No respiratory distress.     Breath sounds: Normal breath sounds. No wheezing.  Abdominal:     General: Bowel sounds are normal. There is no distension.     Palpations: Abdomen is soft.     Tenderness: There is no abdominal tenderness.  Musculoskeletal:        General: No tenderness or edema. Normal range of motion.     Cervical back: Normal range of motion and neck supple.  Skin:    General: Skin is warm and dry.     Findings: No erythema or rash.  Neurological:     General: No focal deficit present.     Mental Status: He is alert and oriented to person, place, and time.     Cranial Nerves: No cranial nerve deficit.     Motor: No weakness.     Coordination: Coordination normal.     Gait: Gait normal.     Deep Tendon Reflexes: Reflexes are normal and symmetric. Reflexes normal.  Psychiatric:  Mood and Affect: Mood and affect normal.        Behavior: Behavior normal.        Thought Content: Thought content normal.        Judgment: Judgment normal.          BP 130/89   Pulse 87   Temp (!) 97.1 F (36.2 C) (Temporal)   Ht 5\' 10"  (1.778 m)   Wt (!) 324 lb (147 kg)   BMI 46.49 kg/m   Assessment & Plan:  Micheal Sabal. comes in today with chief complaint of ER F/U (Back pain, neck pain, shoulder and headache/)   Diagnosis and orders addressed:  1. Hospital discharge follow-up Hospital notes reviewed  2. Acute intractable tension-type headache - MR Brain W Wo Contrast; Future  3. Acute pain of right shoulder - Ambulatory referral to Orthopedic Surgery  4. Numbness and tingling in left hand - MR Brain W Wo Contrast; Future - Ambulatory referral to Orthopedic Surgery  5. Osteoarthritis of spine with radiculopathy, cervical region - Ambulatory referral to Orthopedic Surgery  6. Meningioma  ([place]) - MR Brain W Wo Contrast; Future   Will order MRI given symptoms and hx of meningioma  Referral to Ortho pending for degenerative changes and pain. Keep follow up with PCP   Evelina Dun, FNP

## 2020-03-10 ENCOUNTER — Encounter (HOSPITAL_COMMUNITY): Payer: 59 | Admitting: Physical Therapy

## 2020-03-12 ENCOUNTER — Other Ambulatory Visit: Payer: Self-pay

## 2020-03-12 ENCOUNTER — Ambulatory Visit (HOSPITAL_COMMUNITY)
Admission: RE | Admit: 2020-03-12 | Discharge: 2020-03-12 | Disposition: A | Discharge status: Home / Self Care | Payer: 59 | Source: Ambulatory Visit | Attending: Emergency Medicine | Admitting: Emergency Medicine

## 2020-03-12 DIAGNOSIS — I6782 Cerebral ischemia: Secondary | ICD-10-CM | POA: Diagnosis present

## 2020-03-12 IMAGING — MR MR HEAD W/O CM
6 of 11 series · 24 of 48 positions shown · non-contrast
Comparison: Head CT [DATE].

CLINICAL DATA: Brain mass or lesion. Additional history obtained
from electronic medical record: Meningioma resection in [[place]].

EXAM:
MRI HEAD WITHOUT CONTRAST
TECHNIQUE: Multiplanar, multiecho pulse sequences of the brain and surrounding
structures were obtained without intravenous contrast.

[Series 3: DWI · axial · 3.0mm · 0.94mm/px · z∈[-90,+69]mm · 7 of 108 slices shown (1 of 2)]
[im 1/108]
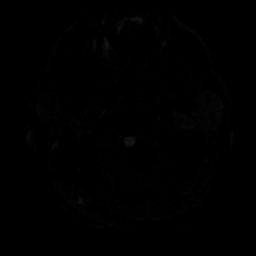
[im 18/108]
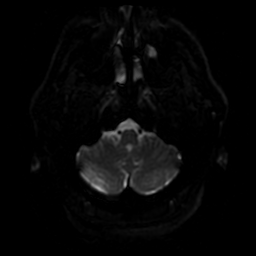
[im 36/108]
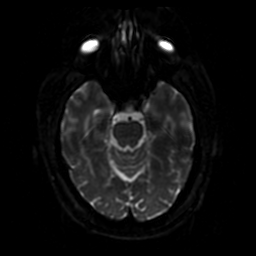
[im 54/108]
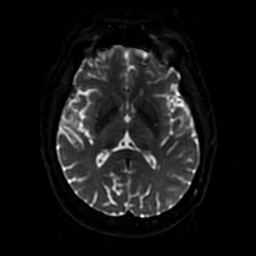
[im 72/108]
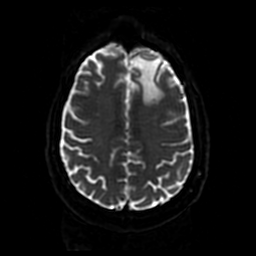
[im 90/108]
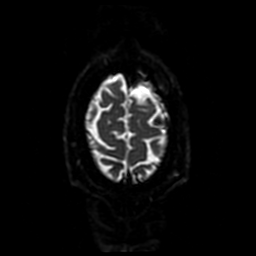
[im 108/108]
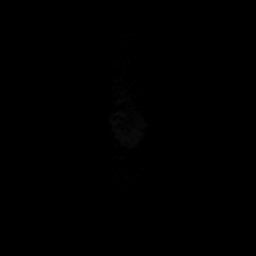

[Series 4: DWI · coronal · 4.0mm · 0.94mm/px · 6 of 78 slices shown (2 of 2)]
[im 1/78]
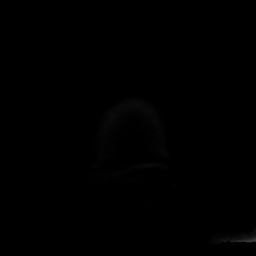
[im 16/78]
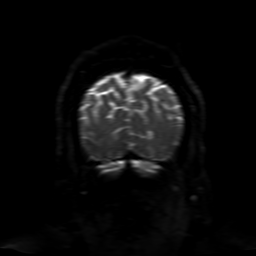
[im 31/78]
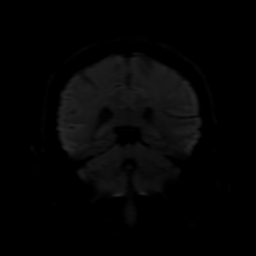
[im 47/78]
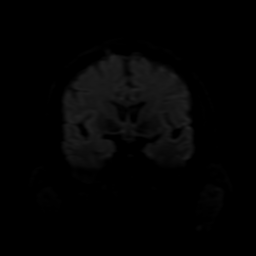
[im 62/78]
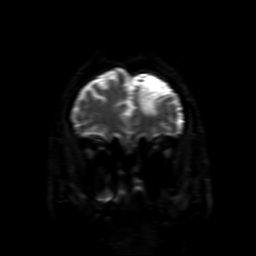
[im 78/78]
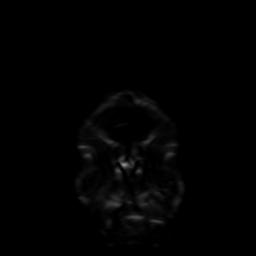

[Series 5: FLAIR · sagittal · 5.0mm · 0.23mm/px · 2 of 26 slices shown (1 of 2)]
[im 1/26]
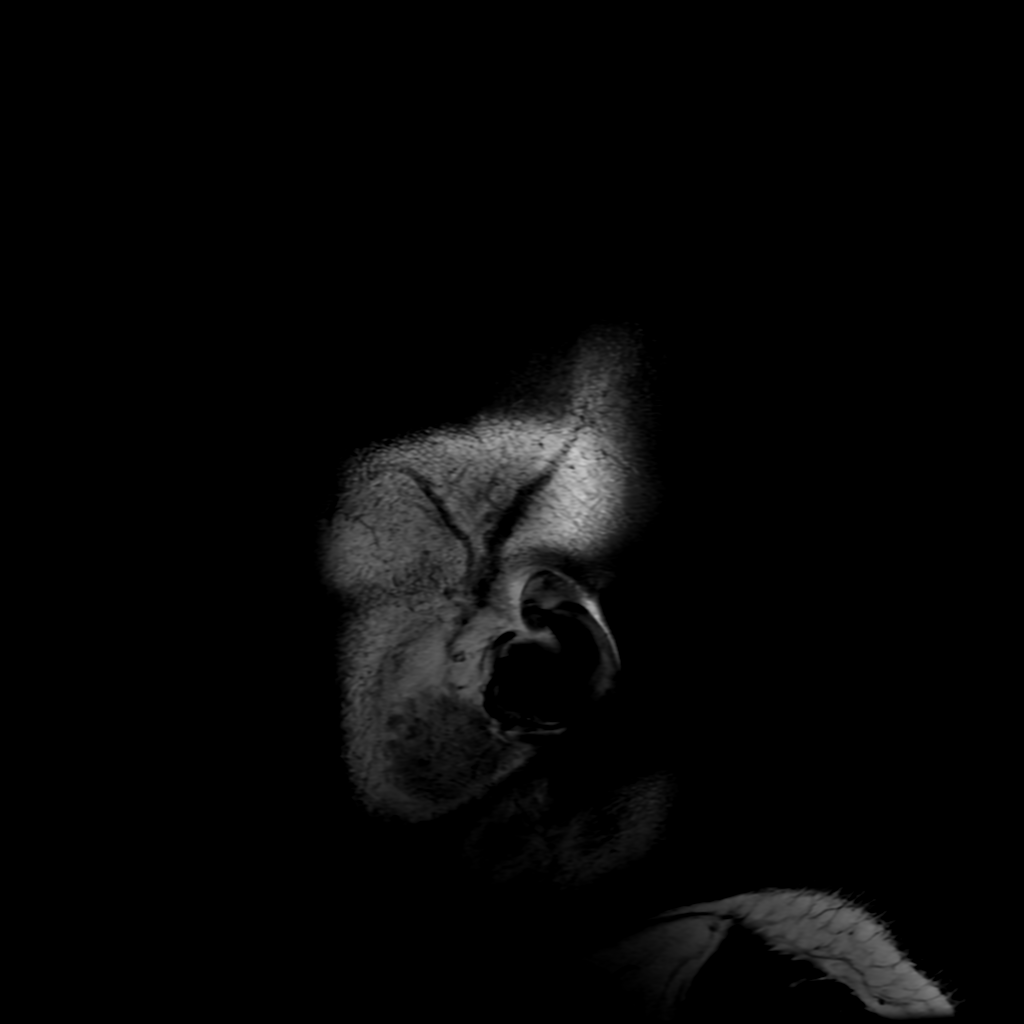
[im 26/26]
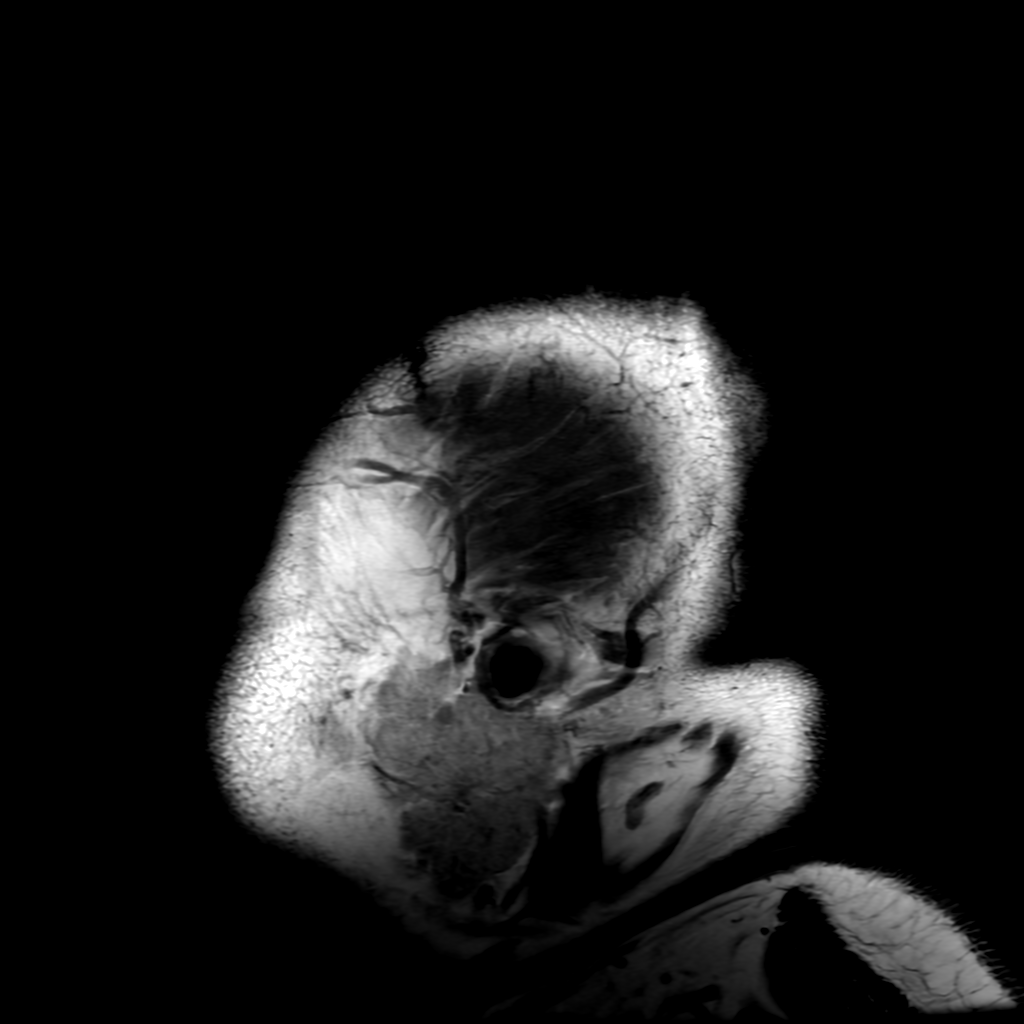

[Series 7: FLAIR · axial · 3.0mm · 0.45mm/px · z∈[-81,+80]mm · 2 of 28 slices shown (2 of 2)]
[im 1/28]
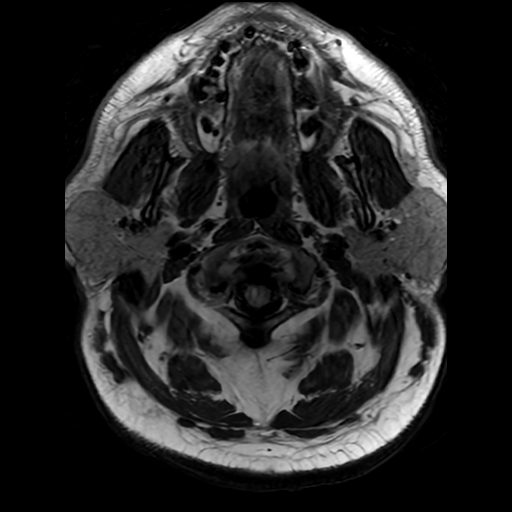
[im 28/28]
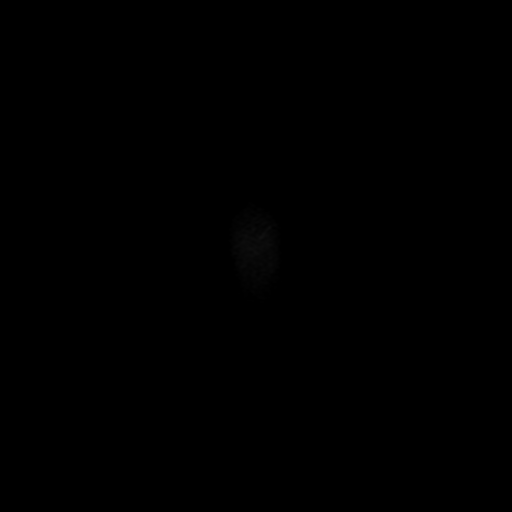

[Series 350: ADC · axial · 3.0mm · 0.94mm/px · z∈[-90,+69]mm · 4 of 54 slices shown (1 of 2)]
[im 1/54]
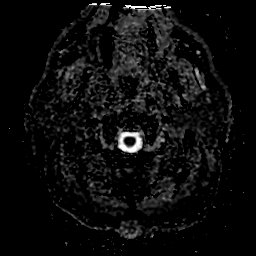
[im 18/54]
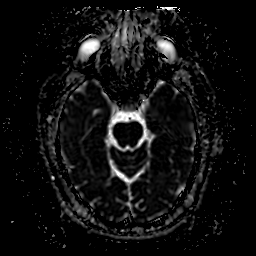
[im 36/54]
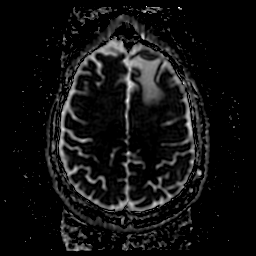
[im 54/54]
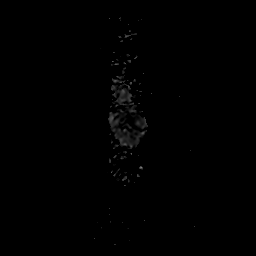

[Series 450: ADC · coronal · 4.0mm · 0.94mm/px · 3 of 39 slices shown (2 of 2)]
[im 1/39]
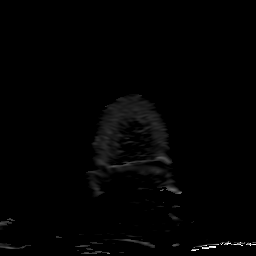
[im 20/39]
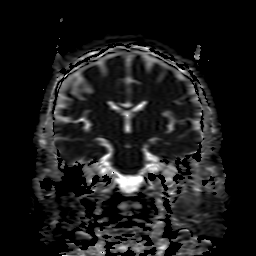
[im 39/39]
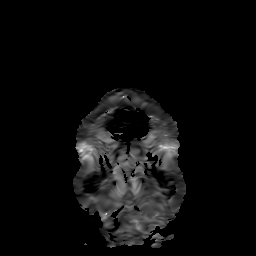

[24 of 48 positions shown; findings below may reference images not displayed]

FINDINGS: Brain:

Mild intermittent motion degradation.

Mild cerebral atrophy.

Moderate-sized region of chronic encephalomalacia/gliosis within the
anterior left frontal lobe with small volume chronic blood products
at this site. Associated ex vacuo dilatation of the left frontal
horn. A tiny focus of chronic encephalomalacia is also present
within the paramedian right frontal lobe (series 7, images 19 and
20). No evidence of residual/recurrent meningioma at this site on
this noncontrast exam.

Mild T2/FLAIR hyperintensity scattered elsewhere within the cerebral
white matter is nonspecific, but compatible with chronic small
vessel ischemic disease.

There is no acute infarct.

No extra-axial fluid collection.

No midline shift.

Vascular: Expected proximal arterial flow voids.

Skull and upper cervical spine: Prior left frontoparietal
craniotomy/cranioplasty. Incompletely assessed cervical spondylosis.
Nonspecific 5 mm T1 hypointense lesion within the C3 vertebral body.

Sinuses/Orbits: Visualized orbits show no acute finding. Mild
ethmoid sinus mucosal thickening. 17 mm mucous retention cyst within
the right maxillary sinus. 20 mm mucous retention cyst within the
left maxillary sinus.
IMPRESSION: No evidence of acute intracranial abnormality.

Moderate-sized region of chronic encephalomalacia/gliosis within the
anterior left frontal lobe, as well as tiny focus of chronic
encephalomalacia within the paramedian right frontal lobe, at site
of prior tumor resection (reported meningioma). No evidence of
residual/recurrent meningioma on this non-contrast examination.

Mild cerebral atrophy and chronic small vessel ischemic disease.

Nonspecific 5 mm T1 hypointense lesion within the C3 vertebral body.
Correlate with findings on pending MRI of the cervical spine.

Mucous retention cysts within the bilateral maxillary sinuses
measuring up to 20 mm.

## 2020-03-12 IMAGING — MR MR CERVICAL SPINE W/O CM
4 of 6 series · 18 of 48 positions shown · non-contrast
Comparison: Cervical spine x-rays dated [DATE].

CLINICAL DATA: Neck pain radiating into the right shoulder and arm.

EXAM:
MRI CERVICAL SPINE WITHOUT CONTRAST
TECHNIQUE: Multiplanar, multisequence MR imaging of the cervical spine was
performed. No intravenous contrast was administered.

[Series 2: T2 · sagittal · 3.0mm · 0.39mm/px · 3 of 17 slices shown (1 of 2)]
[im 1/17]
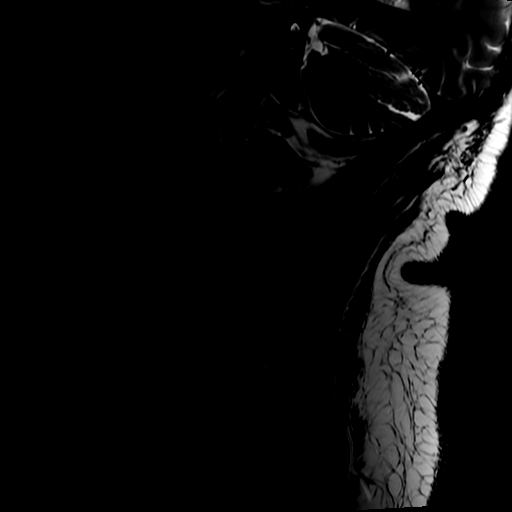
[im 9/17]
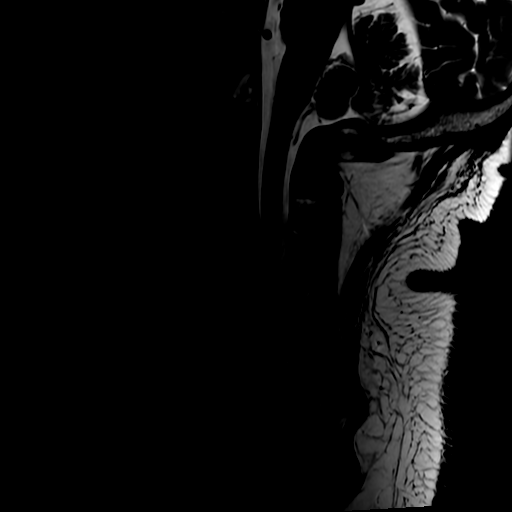
[im 17/17]
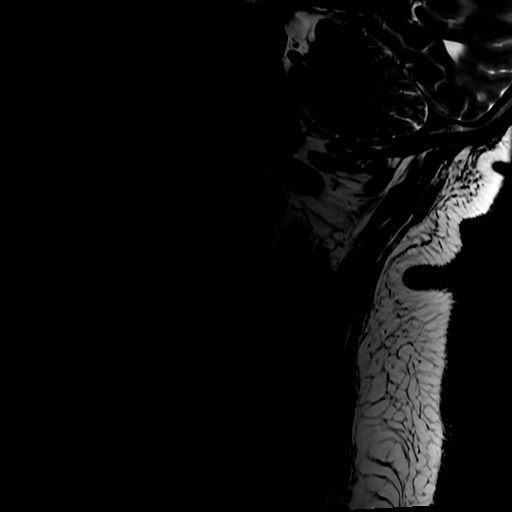

[Series 4: STIR · sagittal · 3.0mm · 0.39mm/px · 3 of 19 slices shown]
[im 1/19]
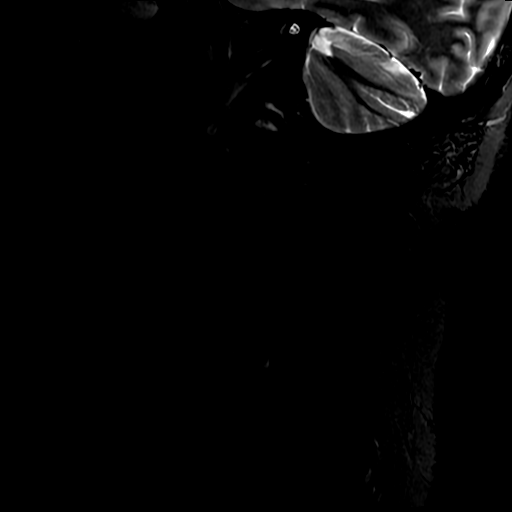
[im 13/19]
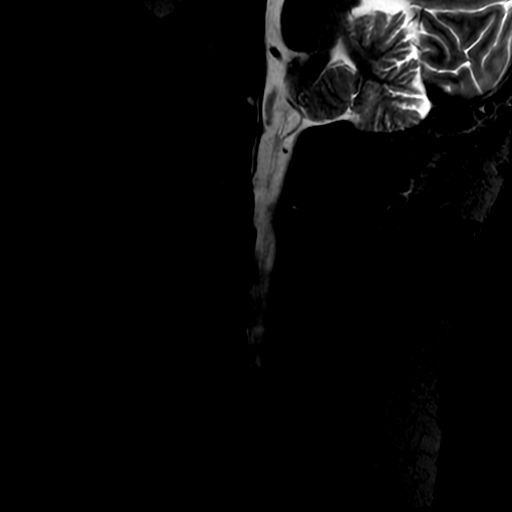
[im 19/19]
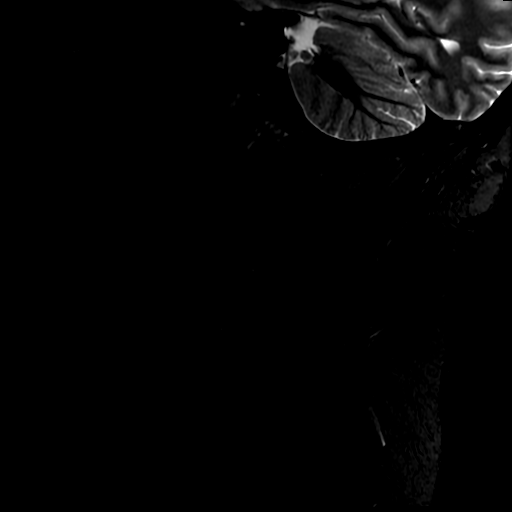

[Series 6: T2 · axial · 3.0mm · 0.35mm/px · z∈[-109,+4]mm · 8 of 36 slices shown (2 of 2)]
[im 1/36]
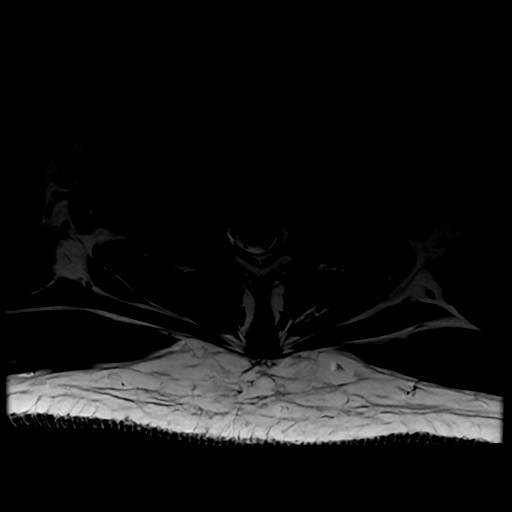
[im 6/36]
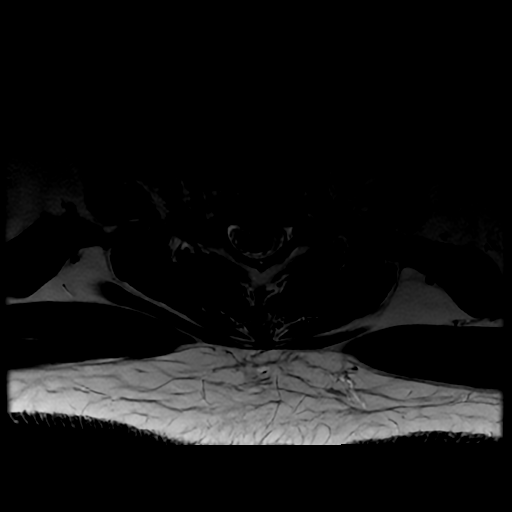
[im 11/36]
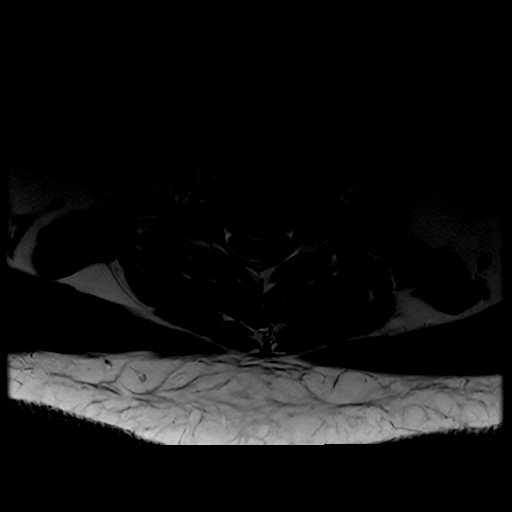
[im 16/36]
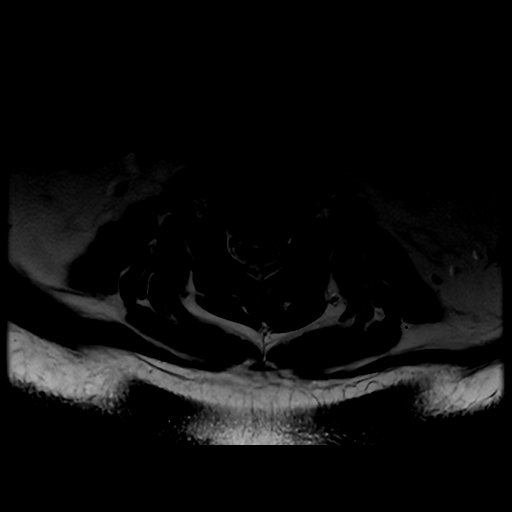
[im 21/36]
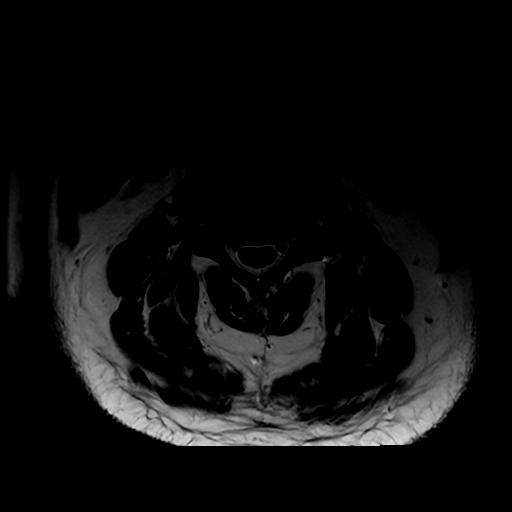
[im 26/36]
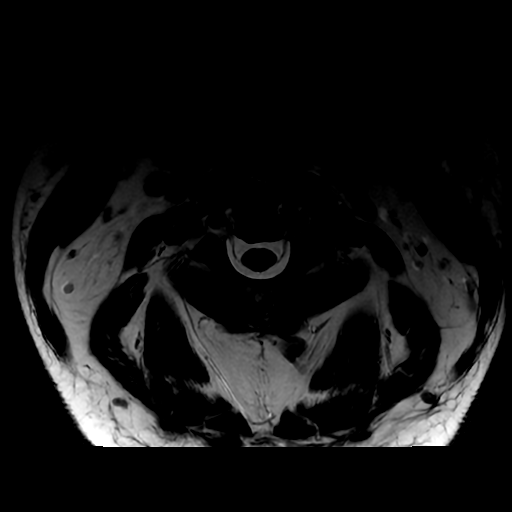
[im 31/36]
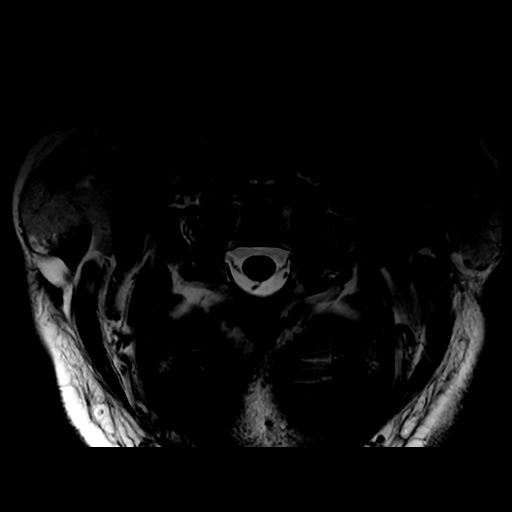
[im 36/36]
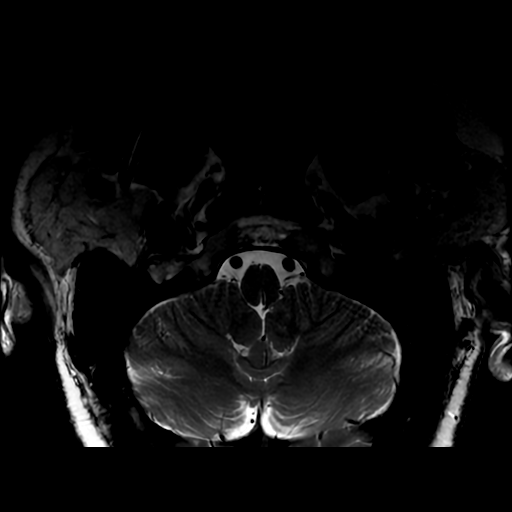

[Series 7: T1 · axial · non-contrast · 3.0mm · 0.35mm/px · z∈[-109,-12]mm · 4 of 36 slices shown]
[im 1/36]
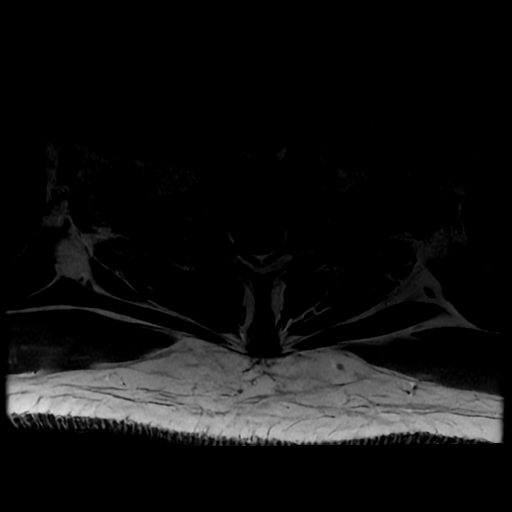
[im 6/36]
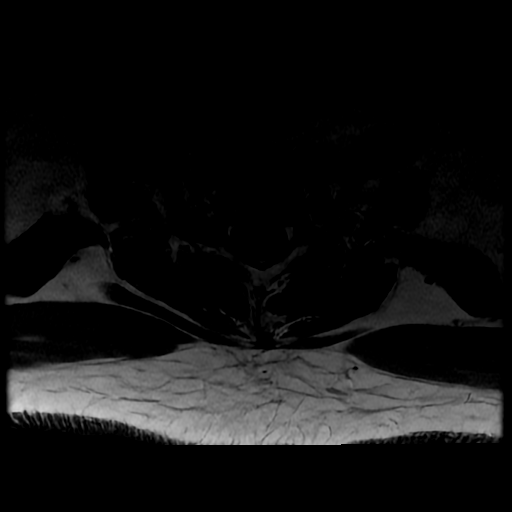
[im 21/36]
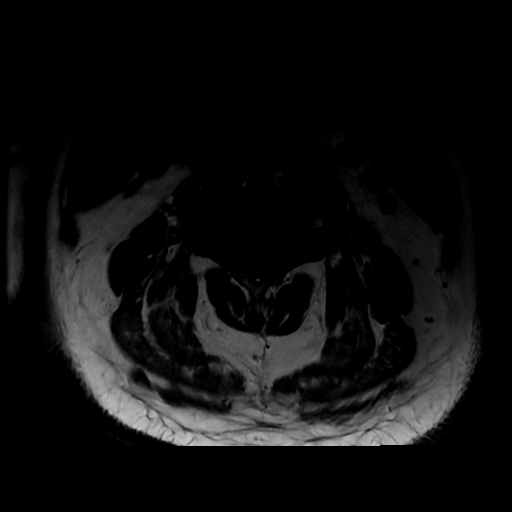
[im 31/36]
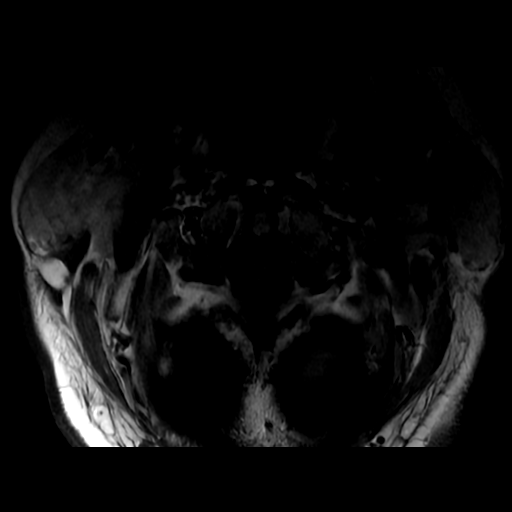

[18 of 48 positions shown; findings below may reference images not displayed]

FINDINGS: Alignment: Slight reversal of the normal cervical lordosis. No
significant listhesis.

Vertebrae: 6 mm heterogeneously T2/STIR hyperintense, T1 hypointense
lesion in the C3 vertebral body. The lesion is somewhat stippled in
appearance on T2 images. No fracture or evidence of discitis.

Cord: Normal signal and morphology.

Posterior Fossa, vertebral arteries, paraspinal tissues: Negative.

Disc levels:

C2-C3:  Negative disc.  Severe left facet arthropathy.  No stenosis.

C3-C4: Mild disc bulging and endplate spurring. Mild bilateral
uncovertebral hypertrophy. Moderate right neuroforaminal stenosis.
No spinal canal or left neuroforaminal stenosis.

C4-C5: Circumferential disc osteophyte complex. Mild bilateral facet
uncovertebral hypertrophy. Mild spinal canal stenosis. Moderate to
severe left and mild-to-moderate right neuroforaminal stenosis.

C5-C6: Circumferential disc osteophyte complex. Moderate bilateral
uncovertebral hypertrophy. Mild spinal canal stenosis. Moderate to
severe bilateral neuroforaminal stenosis.

C6-C7: Mild disc bulging with superimposed small right paracentral
disc osteophyte complex. Mild right uncovertebral hypertrophy. Mild
right neuroforaminal stenosis. No spinal canal or left
neuroforaminal stenosis.

C7-T1: Mild disc bulging and bilateral facet uncovertebral
hypertrophy. Moderate to severe left and moderate right
neuroforaminal stenosis. No spinal canal stenosis.

T1-T2: Mild disc bulging with superimposed left paracentral and
foraminal disc protrusion. Left facet arthropathy. Moderate to
severe left and mild right neuroforaminal stenosis. No spinal canal
stenosis.
IMPRESSION: 1. Multilevel degenerative changes of the cervical spine as
described above. Mild spinal canal stenosis and moderate to severe
neuroforaminal stenosis at C4-C5 and C5-C6.
2. Moderate to severe left neuroforaminal stenosis at C7-T1 and
T1-T2.
3. Nonspecific 6 mm lesion in the C3 vertebral body. Appearance is
favored to reflect an atypical hemangioma. However, given the
patient's age, follow-up MRI with and without contrast in 3-6 months
is recommended to evaluate for stability.

## 2020-03-18 ENCOUNTER — Ambulatory Visit: Payer: 59 | Admitting: Orthopaedic Surgery

## 2020-03-18 ENCOUNTER — Encounter: Payer: Self-pay | Admitting: Orthopaedic Surgery

## 2020-03-20 ENCOUNTER — Other Ambulatory Visit: Payer: Self-pay | Admitting: Family Medicine

## 2020-03-20 DIAGNOSIS — E782 Mixed hyperlipidemia: Secondary | ICD-10-CM

## 2020-03-24 ENCOUNTER — Encounter (HOSPITAL_COMMUNITY): Payer: 59 | Admitting: Physical Therapy

## 2020-06-16 ENCOUNTER — Other Ambulatory Visit: Payer: Self-pay | Admitting: Family Medicine

## 2020-06-16 DIAGNOSIS — E782 Mixed hyperlipidemia: Secondary | ICD-10-CM

## 2020-09-01 ENCOUNTER — Ambulatory Visit (HOSPITAL_COMMUNITY): Payer: 59 | Attending: Neurosurgery

## 2020-09-01 ENCOUNTER — Other Ambulatory Visit: Payer: Self-pay

## 2020-09-01 DIAGNOSIS — M542 Cervicalgia: Secondary | ICD-10-CM | POA: Insufficient documentation

## 2020-09-01 DIAGNOSIS — R293 Abnormal posture: Secondary | ICD-10-CM | POA: Diagnosis present

## 2020-09-01 NOTE — Therapy (Addendum)
[facility] [address] [place], [place], [address] Phone: [phone]   Fax:  [phone]  Physical Therapy Evaluation and Discharge note  Patient Details  Name: Micheal Ross. MRN: [id number] Date of Birth: [birth year] Referring Provider (PT): Judson Roch Ardis Rowan, [facility]   PHYSICAL THERAPY DISCHARGE SUMMARY  Visits from Start of Care: 1  Current functional level related to goals / functional outcomes: Could not be reassessed secondary to unplanned discharge  Remaining deficits: Could not be reassessed secondary to unplanned discharge   Education / Equipment: Could not be reassessed secondary to unplanned discharge  Patient agrees to discharge. Patient goals were not met. Patient is being discharged due to not returning since the last visit.  3:55 PM, 01/20/21 Jerene Pitch, DPT Physical Therapy with [facility]  [phone] office    Encounter Date: 09/01/2020   PT End of Session - 09/01/20 1032     Visit Number 1    Number of Visits 6    Date for PT Re-Evaluation 10/13/20    Authorization Type [employer], VL 30 0 required auth, VL 30    Authorization - Visit Number 1    Authorization - Number of Visits 30    PT Start Time 6004    PT Stop Time 1115    PT Time Calculation (min) 45 min    Activity Tolerance Patient tolerated treatment well    Behavior During Therapy WFL for tasks assessed/performed             Past Medical History:  Diagnosis Date   Brain tumor ([place])    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)   Hypercholesterolemia    Hypertension    Neck pain    Shoulder pain    Sleep apnea    set to get tested soon 01/20/19    Past Surgical History:  Procedure Laterality Date   ANKLE SURGERY     brain tumor removal  2014   COLONOSCOPY WITH PROPOFOL N/A 01/13/2019   Procedure: COLONOSCOPY WITH PROPOFOL;  Surgeon: Daneil Dolin, MD;  20 mm polyp found in the cecum that was removed via  piecemeal hot snare polypectomy. Repeat in 4 months. path tubulovillous adenoma, negative for high-grade dysplasia   COLONOSCOPY WITH PROPOFOL N/A 01/20/2019   Procedure: COLONOSCOPY WITH PROPOFOL;  Surgeon: Danie Binder, MD; large defect (2 cm x 1.5 cm with small visible vessel with stigmata of recent bleed) in the cecum.  3 hemostatic clips placed (MR conditional).  One 3 mm polyp in the cecum, tortuous colon, external and internal hemorrhoids.  Pathology revealed tubular adenoma.   HERNIA REPAIR  [id number]   umbilical   POLYPECTOMY  01/13/2019   Procedure: POLYPECTOMY;  Surgeon: Daneil Dolin, MD;  Location: [facility];  Service: Endoscopy;;  Large Cecal Polyp     There were no vitals filed for this visit.    Subjective Assessment - 09/01/20 1034     Subjective Pt notes chronic neck pain which gradually became worse in 01/2020 and had steroid shots in his neck in March of 2022 and notes signifcant relief in his RUE symptoms since that time. Pt notes no return of RUE radicular pain but continued neck and upper shoulder pain    Pertinent History HTN, brain tumor removal 2014    Limitations Sitting;House hold activities;Lifting    Diagnostic tests xray    Patient Stated Goals to have less pain    Currently in Pain? Yes  Pain Score 6     Pain Location Neck    Pain Orientation Right;Left    Pain Descriptors / Indicators Aching;Sore    Pain Type Chronic pain    Pain Radiating Towards bilateral upper traps    Pain Onset More than a month ago                [facility] PT Assessment - 09/01/20 0001       Assessment   Medical Diagnosis neck and shoulder pain    Referring Provider (PT) Thane Edu, [facility]    Next MD Visit not until therapy has ended      Balance Screen   Has the patient fallen in the past 6 months No    Has the patient had a decrease in activity level because of a fear of falling?  No    Is the patient reluctant to leave their home because of a fear of  falling?  No      Prior Function   Level of Independence Independent    Vocation Full time employment    Vocation Requirements driving school buses      Observation/Other Assessments   Focus on Therapeutic Outcomes (FOTO)  48% function      Posture/Postural Control   Posture/Postural Control Postural limitations    Postural Limitations Forward head      ROM / Strength   AROM / PROM / Strength AROM      AROM   Cervical Flexion WNL    Cervical Extension 10% limited, discomfort    Cervical - Right Side Bend 25% limited    Cervical - Left Side Bend WNL    Cervical - Right Rotation 10% limited, discomfort    Cervical - Left Rotation WNL      Strength   Overall Strength Within functional limits for tasks performed      Palpation   Palpation comment tenderness to light touch along right SCM, UT, anterior scalenes and cervical paraspinals                        Objective measurements completed on examination: See above findings.               PT Education - 09/01/20 1220     Education Details discussion regarding cervical spondylosis and benefits of traction    Person(s) Educated Patient    Methods Explanation    Comprehension Verbalized understanding              PT Short Term Goals - 09/01/20 1224       PT SHORT TERM GOAL #1   Title Patient will be independent in self management strategies to improve quality of life and functional outcomes.    Time 4    Period Weeks    Status New    Target Date 09/29/20      PT SHORT TERM GOAL #2   Title Patient will report at least 25% improvement in overall symptoms and/or function to demonstrate improved functional mobility    Time 4    Period Weeks    Status New    Target Date 09/29/20      PT SHORT TERM GOAL #3   Title Patient will be able to demonstrate active right neck rotation to 10% limited    Baseline 25% limited    Time 4    Period Weeks    Status New    Target Date 09/29/20  PT Long Term Goals - 09/01/20 1225       PT LONG TERM GOAL #1   Title Patient will be able to demonstrate cervical ROM without increase in neck or shoulder symptoms    Time 6    Period Weeks    Status New    Target Date 10/13/20      PT LONG TERM GOAL #2   Title Patient will improve on FOTO score to meet predicted outcomes to demonstrate improved functional mobility.    Baseline 48% function    Time 6    Period Weeks    Status New    Target Date 10/13/20      PT LONG TERM GOAL #3   Title Patient will report at least 50% improvement in overall symptoms and/or function to demonstrate improved functional mobility    Time 6    Period Weeks    Status New    Target Date 10/13/20                    Plan - 09/01/20 1221     Clinical Impression Statement Patient presents to PT clinic with continued neck pain which is worse with right cervical rotation and sidebending.  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, early years/pre and would benefit from PT interventions to improve postural alignment, reduce pain, and train/instructe in activities/strategies to ameliorate his symptoms to enable greater activity tolerance and prevent risk for re-injury and symptom exacerbation    Personal Factors and Comorbidities Comorbidity 1;Comorbidity 2    Comorbidities HTN    Examination-Activity Limitations Reach Overhead;Bed Mobility;Lift    Examination-Participation Restrictions Driving;Occupation    Stability/Clinical Decision Making Stable/Uncomplicated    Clinical Decision Making Low    Rehab Potential Good    PT Frequency 1x / week   1-2x/week for total of 12 visits over 8 week certification   PT Duration 6 weeks    PT Treatment/Interventions ADLs/Self Care Home Management;Cryotherapy;Electrical Stimulation;Moist Heat;Traction;Therapeutic exercise;Therapeutic activities;Manual techniques;Neuromuscular re-education;Patient/family education;Joint  Manipulations    PT Next Visit Plan f/u with chin tuck, traction, scapular strengthening,    PT Home Exercise Plan chin tuck with cervical lengthening, dsicussion on use of cervical traction (over the door)    Consulted and Agree with Plan of Care Patient             Patient will benefit from skilled therapeutic intervention in order to improve the following deficits and impairments:  Pain, Decreased activity tolerance, Postural dysfunction, Decreased range of motion, Decreased strength, Impaired UE functional use  Visit Diagnosis: Neck pain  Abnormal posture     Problem List Patient Active Problem List   Diagnosis Date Noted   Bee sting 12/05/2019   Obstructive sleep apnea 03/31/2019   Acute blood loss anemia 01/20/2019   Tachycardia 01/20/2019   Lower GI bleed    Weakness of both lower extremities 01/15/2019   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 Sherlyn Lees, PT, DPT Physical Therapist- [place] Office Number: [phone]   [facility] [address] [place], [place], [address] Phone: [phone]   Fax:  [phone]  Name: Micheal Ross. MRN: [id number] Date of Birth: 01-25-1961

## 2020-09-05 ENCOUNTER — Emergency Department (HOSPITAL_COMMUNITY)
Admission: EM | Admit: 2020-09-05 | Discharge: 2020-09-06 | Disposition: A | Discharge status: Home / Self Care | Payer: 59 | Attending: Emergency Medicine | Admitting: Emergency Medicine

## 2020-09-05 ENCOUNTER — Other Ambulatory Visit: Payer: Self-pay

## 2020-09-05 ENCOUNTER — Emergency Department (HOSPITAL_COMMUNITY): Payer: 59

## 2020-09-05 DIAGNOSIS — R42 Dizziness and giddiness: Secondary | ICD-10-CM | POA: Insufficient documentation

## 2020-09-05 DIAGNOSIS — R110 Nausea: Secondary | ICD-10-CM | POA: Diagnosis not present

## 2020-09-05 DIAGNOSIS — Z87891 Personal history of nicotine dependence: Secondary | ICD-10-CM | POA: Insufficient documentation

## 2020-09-05 DIAGNOSIS — M542 Cervicalgia: Secondary | ICD-10-CM | POA: Diagnosis not present

## 2020-09-05 DIAGNOSIS — R519 Headache, unspecified: Secondary | ICD-10-CM | POA: Diagnosis not present

## 2020-09-05 DIAGNOSIS — Z79899 Other long term (current) drug therapy: Secondary | ICD-10-CM | POA: Insufficient documentation

## 2020-09-05 DIAGNOSIS — I10 Essential (primary) hypertension: Secondary | ICD-10-CM | POA: Insufficient documentation

## 2020-09-05 LAB — BASIC METABOLIC PANEL
Anion gap: 11 (ref 5–15)
BUN: 12 mg/dL (ref 6–20)
CO2: 23 mmol/L (ref 22–32)
Calcium: 9.4 mg/dL (ref 8.9–10.3)
Chloride: 105 mmol/L (ref 98–111)
Creatinine, Ser: 1.26 mg/dL — ABNORMAL HIGH (ref 0.61–1.24)
GFR, Estimated: >60 mL/min (ref >60)
Glucose, Bld: 117 mg/dL — ABNORMAL HIGH (ref 70–99)
Potassium: 3.2 mmol/L — ABNORMAL LOW (ref 3.5–5.1)
Sodium: 139 mmol/L (ref 135–145)

## 2020-09-05 LAB — URINALYSIS, ROUTINE W REFLEX MICROSCOPIC
Bacteria, UA: NONE SEEN
Bilirubin Urine: NEGATIVE
Glucose, UA: NEGATIVE mg/dL
Hgb urine dipstick: NEGATIVE
Ketones, ur: NEGATIVE mg/dL
Leukocytes,Ua: NEGATIVE
Nitrite: NEGATIVE
Protein, ur: 100 mg/dL — AB
Specific Gravity, Urine: 1.012 (ref 1.005–1.030)
pH: 7 (ref 5.0–8.0)

## 2020-09-05 LAB — CBC
HCT: 40.4 % (ref 39.0–52.0)
Hemoglobin: 14.2 g/dL (ref 13.0–17.0)
MCH: 32.1 pg (ref 26.0–34.0)
MCHC: 35.1 g/dL (ref 30.0–36.0)
MCV: 91.2 fL (ref 80.0–100.0)
Platelets: 225 10*3/uL (ref 150–400)
RBC: 4.43 MIL/uL (ref 4.22–5.81)
RDW: 12.4 % (ref 11.5–15.5)
WBC: 8.6 10*3/uL (ref 4.0–10.5)
nRBC: 0 % (ref 0.0–0.2)

## 2020-09-05 IMAGING — CT CT HEAD W/O CM
3 series · 15 of 47 positions shown, 18 images · non-contrast
Comparison: MRI brain [DATE]

CLINICAL DATA: Dizziness and right-sided headache for 1 hour.
Facial swelling.

EXAM:
CT HEAD WITHOUT CONTRAST
TECHNIQUE: Contiguous axial images were obtained from the base of the skull
through the vertex without intravenous contrast.

[Series 2: head w o · axial · 0.46mm/px · z∈[+1586,+1716]mm · 9 of 32 slices shown, 12 images]
[im 3/32  brain]
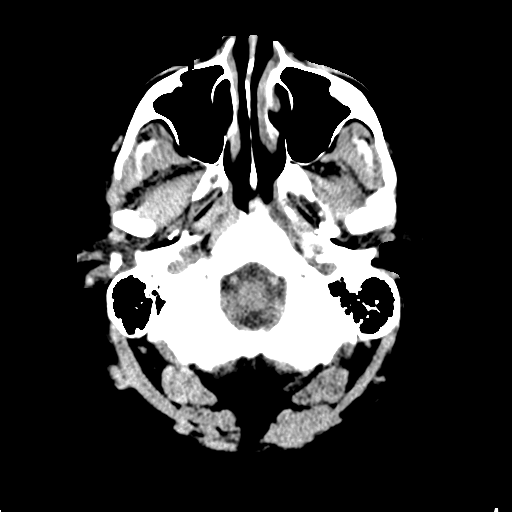
[im 3/32  bone]
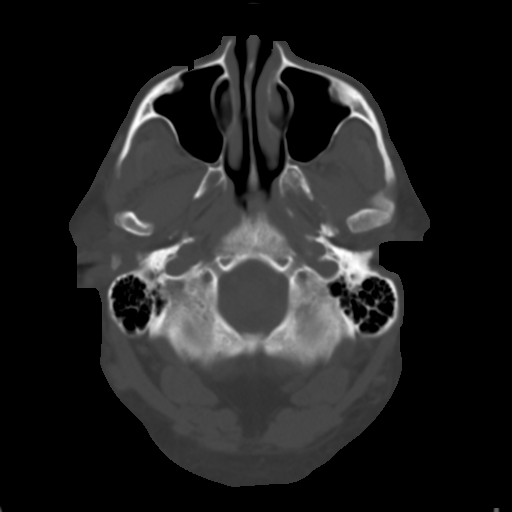
[im 6/32  brain]
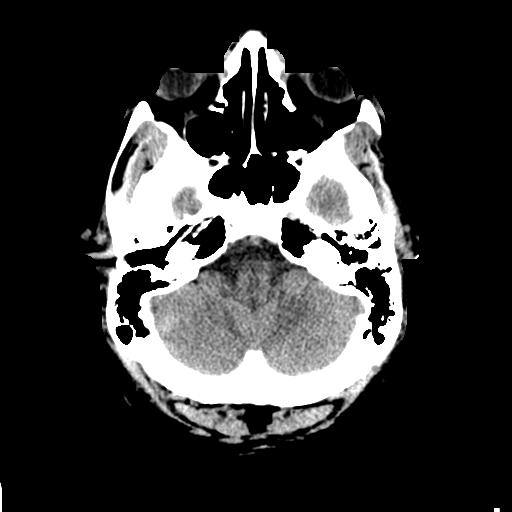
[im 9/32  brain]
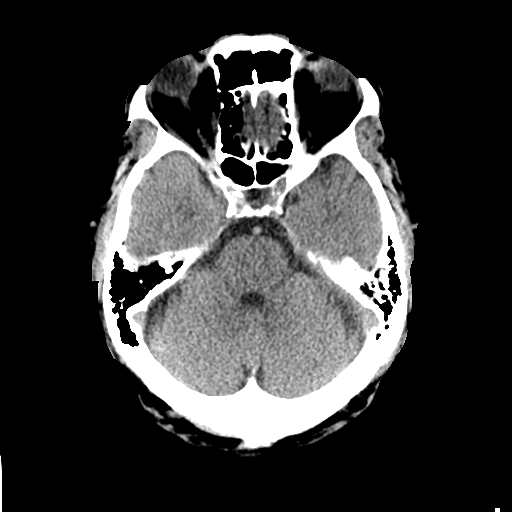
[im 12/32  brain]
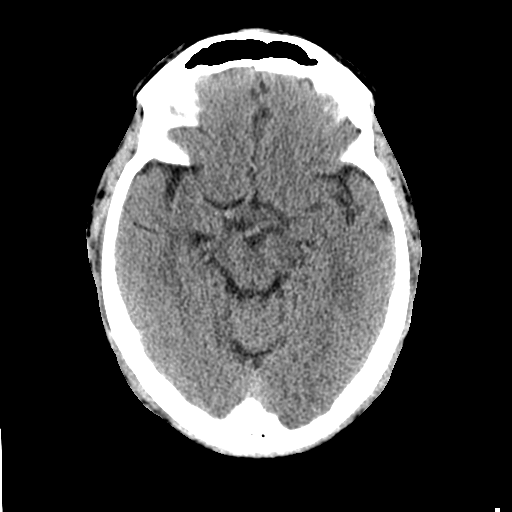
[im 17/32  brain]
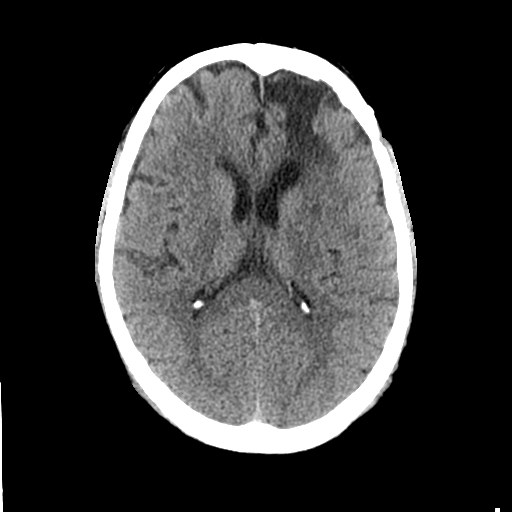
[im 17/32  bone]
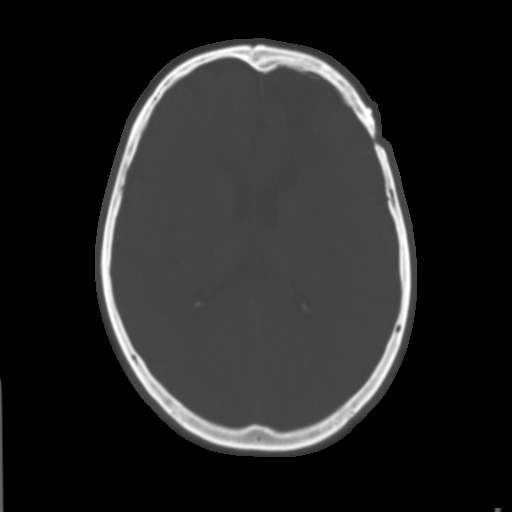
[im 20/32  brain]
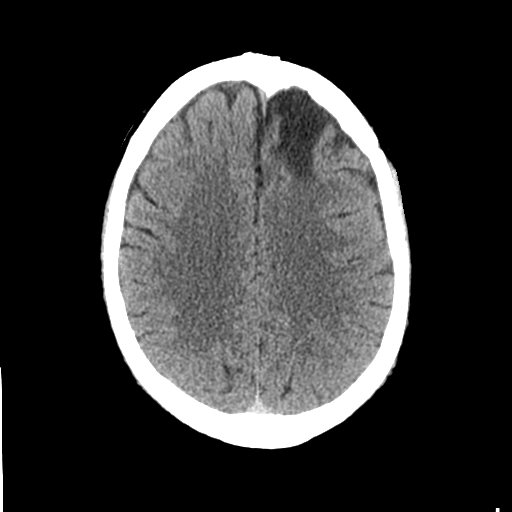
[im 23/32  brain]
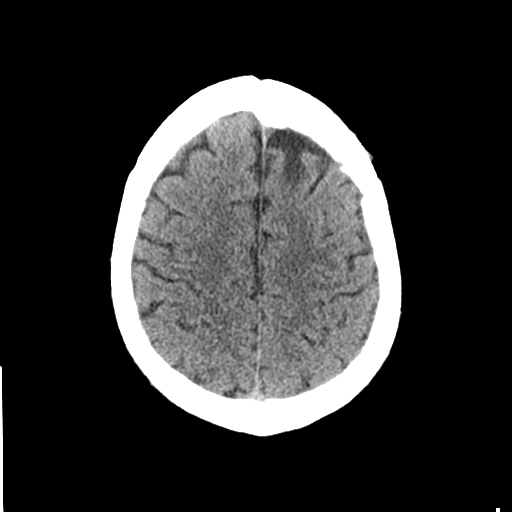
[im 26/32  brain]
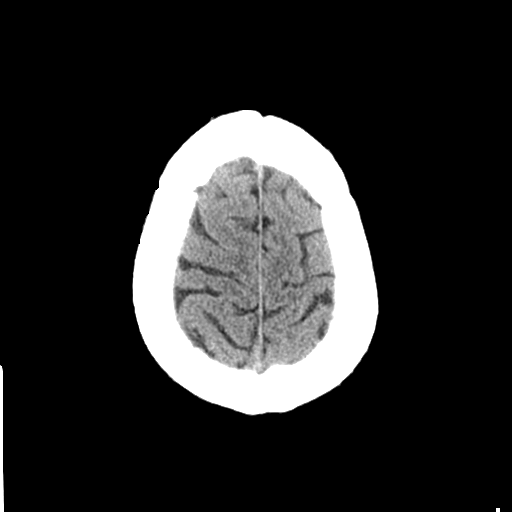
[im 29/32  brain]
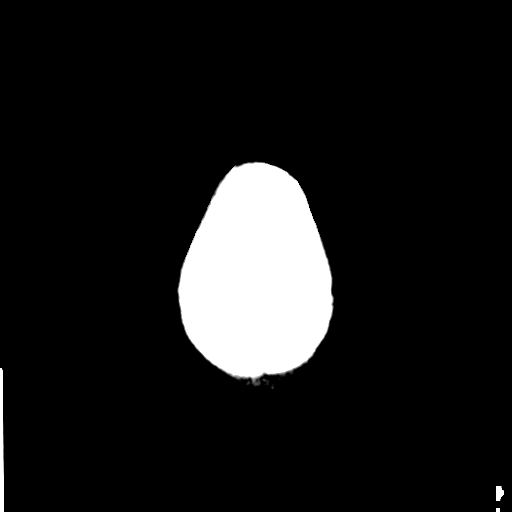
[im 29/32  bone]
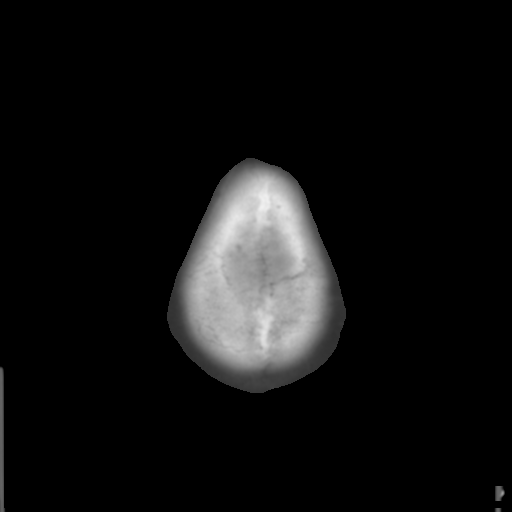

[Series 4: coronal soft · coronal · 0.34mm/px · 3 of 78 slices shown]
[im 26/78  brain]
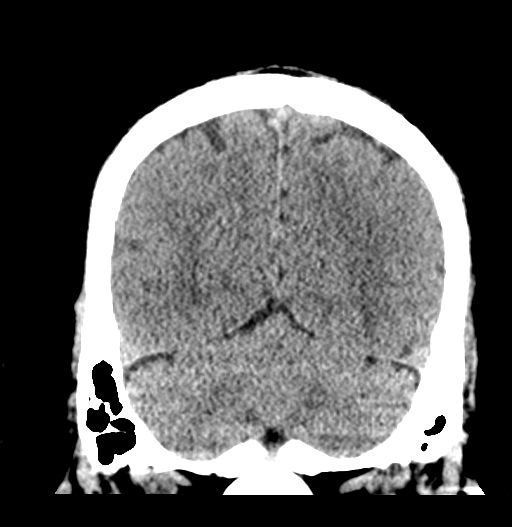
[im 35/78  brain]
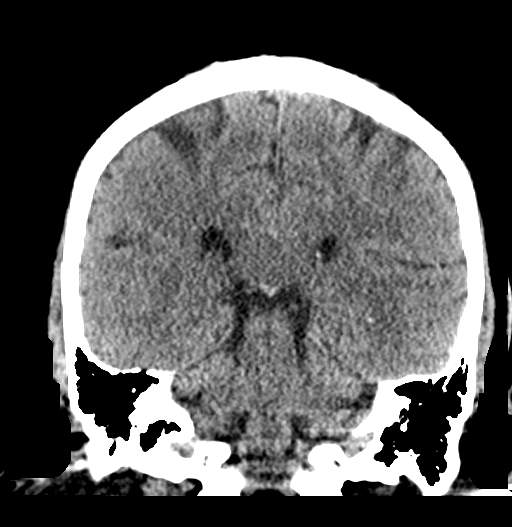
[im 43/78  brain]
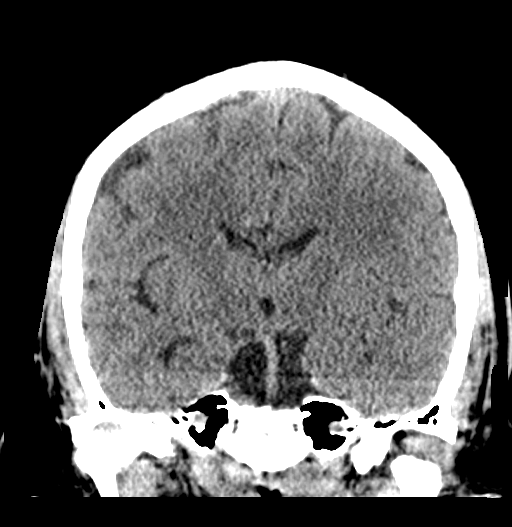

[Series 5: sagittal soft · sagittal · 0.35mm/px · 3 of 61 slices shown]
[im 21/61  brain]
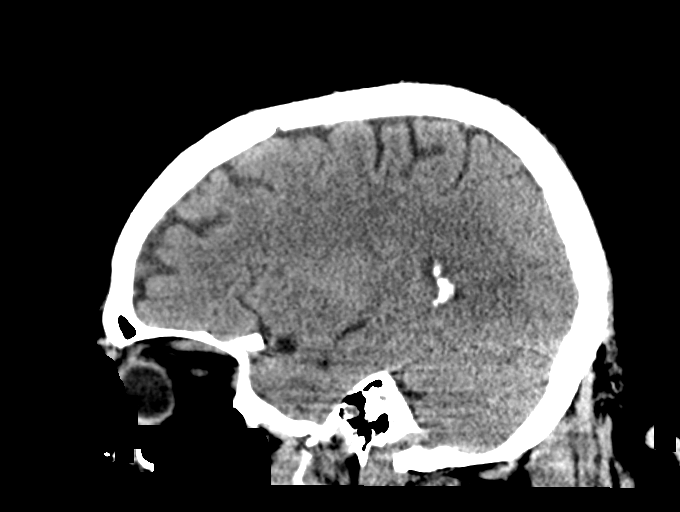
[im 31/61  brain]
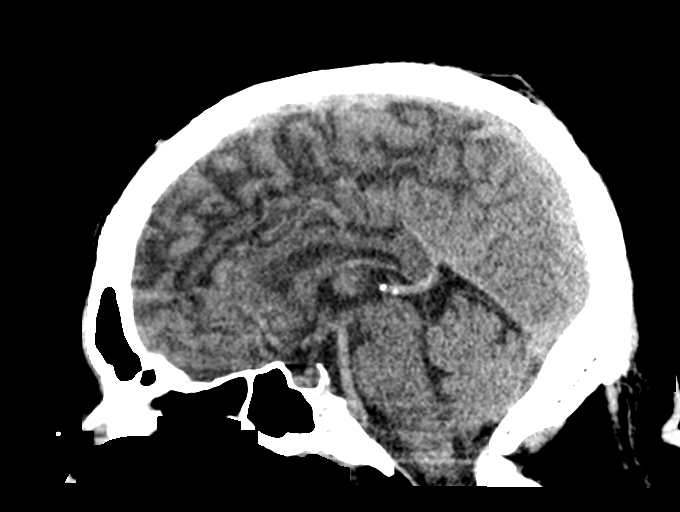
[im 41/61  brain]
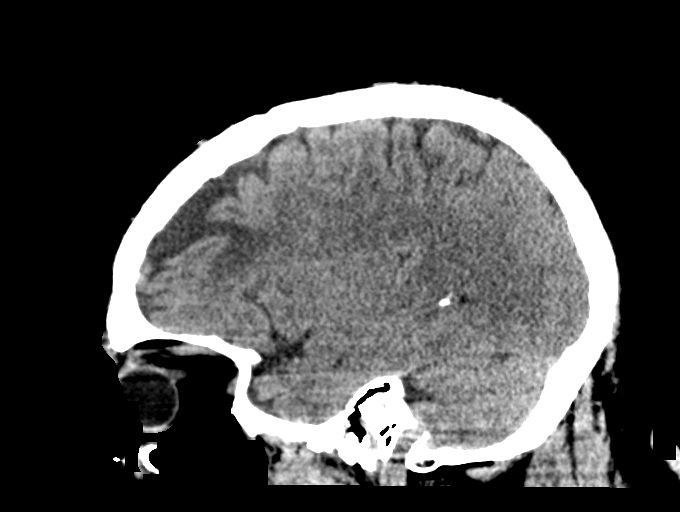

[15 of 47 positions shown; findings below may reference images not displayed]

FINDINGS: Brain: Focal area of encephalomalacia in the left anterior frontal
lobe is unchanged since prior study, likely postoperative.
Otherwise, ventricles and sulci appear symmetrical. No mass effect
or midline shift. No ventricular dilatation. Gray-white matter
junctions are distinct. Basal cisterns are not effaced. No abnormal
extra-axial fluid collections. No acute intracranial hemorrhage.

Vascular: Moderate intracranial arterial vascular calcifications.

Skull: Postoperative changes with previous left frontal craniotomy.
No acute depressed fractures identified.

Sinuses/Orbits: Retention cysts in the maxillary antra. No acute
air-fluid levels. Mastoid air cells are clear.

Other: None.
IMPRESSION: Postoperative changes in the left anterior frontal region with
underlying encephalomalacia, stable. No acute intracranial
abnormalities identified.

## 2020-09-05 MED ORDER — KETOROLAC TROMETHAMINE 30 MG/ML IJ SOLN
30.0000 mg | Freq: Once | INTRAMUSCULAR | Status: AC
  Administered 2020-09-06: 30 mg via INTRAVENOUS
  Filled 2020-09-05: qty 1

## 2020-09-05 MED ORDER — SODIUM CHLORIDE 0.9 % IV BOLUS
1000.0000 mL | Freq: Once | INTRAVENOUS | Status: AC
  Administered 2020-09-06: 1000 mL via INTRAVENOUS

## 2020-09-05 MED ORDER — MECLIZINE HCL 12.5 MG PO TABS
25.0000 mg | ORAL_TABLET | Freq: Once | ORAL | Status: AC
  Administered 2020-09-06: 25 mg via ORAL
  Filled 2020-09-05: qty 2

## 2020-09-05 MED ORDER — ONDANSETRON HCL 4 MG/2ML IJ SOLN
4.0000 mg | Freq: Once | INTRAMUSCULAR | Status: AC
  Administered 2020-09-06: 4 mg via INTRAVENOUS
  Filled 2020-09-05: qty 2

## 2020-09-05 NOTE — ED Notes (Signed)
Pt. States laying down makes them dizzier.

## 2020-09-05 NOTE — ED Provider Notes (Signed)
[facility] EMERGENCY DEPARTMENT Provider Note   CSN: [id number] Arrival date & time: 09/05/20  2022     History Chief Complaint  Patient presents with   Dizziness    Micheal Ross. is a 60 y.o. male.  Patient is a 60 year old male with past medical history of obstructive sleep apnea, hypertension, hyperlipidemia.  Patient presenting today for evaluation of dizziness.  This started approximately 8 PM tonight in the absence of any injury or trauma.  He describes a lightheadedness that is worse when he stands and when he lays flat.  He also reports nausea but no vomiting.  He denies any visual disturbances, but does report generalized headache and neck pain.  He has history of degenerative disc disease and has chronic neck pain from this.  The history is provided by the patient.  Dizziness Quality:  Lightheadedness Severity:  Moderate Onset quality:  Sudden Duration:  3 hours Timing:  Intermittent Progression:  Unchanged Chronicity:  New Relieved by:  Being still Worsened by:  Lying down, sitting upright and standing up Ineffective treatments:  None tried Associated symptoms: no blood in stool and no palpitations       Past Medical History:  Diagnosis Date   Brain tumor ([place])    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)   Hypercholesterolemia    Hypertension    Neck pain    Shoulder pain    Sleep apnea    set to get tested soon 01/20/19    Patient Active Problem List   Diagnosis Date Noted   Bee sting 12/05/2019   Obstructive sleep apnea 03/31/2019   Acute blood loss anemia 01/20/2019   Tachycardia 01/20/2019   Lower GI bleed    Weakness of both lower extremities 01/15/2019   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 Repeat in 4 months. path tubulovillous adenoma, negative for high-grade dysplasia   COLONOSCOPY WITH PROPOFOL N/A 01/20/2019   Procedure: COLONOSCOPY WITH PROPOFOL;  Surgeon: Danie Binder, MD; large defect (2 cm x 1.5 cm with small visible vessel with stigmata of recent bleed) in the cecum.  3 hemostatic clips placed (MR conditional).  One 3 mm polyp in the cecum, tortuous colon, external and internal hemorrhoids.  Pathology revealed tubular adenoma.   HERNIA REPAIR  [id number]   umbilical   POLYPECTOMY  01/13/2019   Procedure: POLYPECTOMY;  Surgeon: Daneil Dolin, MD;  Location: [facility] SUITE;  Service: Endoscopy;;  Large Cecal Polyp        Family History  Problem Relation Age of Onset   Thyroid disease Mother    COPD Father    Cancer Maternal Aunt    Cancer Maternal Uncle        back   Cancer Paternal Uncle        throat    Cancer Cousin        prostate   Colon cancer Neg Hx    Colon polyps Neg Hx     Social  History   Tobacco Use   Smoking status: Former    Packs/day: 0.50    Years: 2.00    Pack years: 1.00    Types: Cigarettes    Quit date: 03/30/1983    Years since quitting: 37.4   Smokeless tobacco: Never  Vaping Use   Vaping Use: Never used  Substance Use Topics   Alcohol use: Yes    Comment: occ   Drug use: No    Home Medications Prior to Admission medications   Medication Sig Start Date End Date Taking? Authorizing Provider  aspirin EC 81 MG tablet Take 81 mg by mouth every other day. Swallow whole. Patient not taking: Reported on 02/26/2020    [provider]  fluticasone (FLONASE) 50 MCG/ACT nasal spray Place 1 spray into both nostrils 2 (two) times daily as needed for allergies  or rhinitis. 11/13/19   Dettinger, Fransisca Kaufmann, MD  lisinopril-hydrochlorothiazide (ZESTORETIC) 20-25 MG tablet Take 1.5 tablets by mouth daily. 03/31/19   Dettinger, Fransisca Kaufmann, MD  lovastatin (MEVACOR) 40 MG tablet Take 1 tablet (40 mg total) by mouth every morning. 04/01/19   Dettinger, Fransisca Kaufmann, MD  meloxicam (MOBIC) 15 MG tablet Take 1 tablet (15 mg total) by mouth daily. 01/05/20   Gwenlyn Perking, FNP  methocarbamol (ROBAXIN) 500 MG tablet Take 1 or 2 po Q 6hrs for muscle  pain 02/23/20   Rolland Porter, MD  Multiple Vitamins-Minerals (ONE-A-DAY MENS 50+ ADVANTAGE) TABS Take 1 tablet by mouth daily. Patient not taking: Reported on 02/26/2020    [provider]  naproxen sodium (ANAPROX) 550 MG tablet Take 1 po BID with food prn pain 02/23/20   Rolland Porter, MD  predniSONE (DELTASONE) 20 MG tablet Take 3 po QD x 3d , then 2 po QD x 3d then 1 po QD x 3d 02/23/20   Rolland Porter, MD    Allergies    Patient has no known allergies.  Review of Systems   Review of Systems  Cardiovascular:  Negative for palpitations.  Gastrointestinal:  Negative for blood in stool.  Neurological:  Positive for dizziness.  All other systems reviewed and are negative.  Physical Exam Updated Vital Signs BP 132/81   Pulse 80   Temp 98.3 F (36.8 C)   Resp 14   Ht 5\' 10"  (1.778 m)   Wt (!) 142.9 kg   SpO2 99%   BMI 45.20 kg/m   Physical Exam Vitals and nursing note reviewed.  Constitutional:      General: He is not in acute distress.    Appearance: He is well-developed. He is not diaphoretic.  HENT:     Head: Normocephalic and atraumatic.  Cardiovascular:     Rate and Rhythm: Normal rate and regular rhythm.     Heart sounds: No murmur heard.   No friction rub.  Pulmonary:     Effort: Pulmonary effort is normal. No respiratory distress.     Breath sounds: Normal breath sounds. No wheezing or rales.  Abdominal:     General: Bowel sounds are normal. There is no distension.     Palpations: Abdomen is  soft.     Tenderness: There is no abdominal tenderness.  Musculoskeletal:        General: Normal range of motion.     Cervical back: Normal range of motion and neck supple.  Skin:    General: Skin is warm and dry.  Neurological:     General: No focal deficit present.  Mental Status: He is alert and oriented to person, place, and time.     Cranial Nerves: No cranial nerve deficit.     Motor: No weakness.     Coordination: Coordination normal.    ED Results / Procedures / Treatments   Labs (all labs ordered are listed, but only abnormal results are displayed) Labs Reviewed  BASIC METABOLIC PANEL - Abnormal; Notable for the following components:      Result Value   Potassium 3.2 (*)    Glucose, Bld 117 (*)    Creatinine, Ser 1.26 (*)    All other components within normal limits  URINALYSIS, ROUTINE W REFLEX MICROSCOPIC - Abnormal; Notable for the following components:   Protein, ur 100 (*)    All other components within normal limits  CBC    EKG EKG Interpretation  Date/Time:  Sunday September 05 2020 21:22:56 EDT Ventricular Rate:  80 PR Interval:  183 QRS Duration: 94 QT Interval:  379 QTC Calculation: 438 R Axis:   -21 Text Interpretation: Sinus rhythm Borderline left axis deviation Borderline T abnormalities, anterior leads No significant change since prior 11/20 Confirmed by Aletta Edouard [phone]) on 09/05/2020 9:48:15 PM  Radiology No results found.  Procedures Procedures   Medications Ordered in ED Medications  sodium chloride 0.9 % bolus 1,000 mL (has no administration in time range)  ketorolac (TORADOL) 30 MG/ML injection 30 mg (has no administration in time range)  meclizine (ANTIVERT) tablet 25 mg (has no administration in time range)  ondansetron (ZOFRAN) injection 4 mg (has no administration in time range)    ED Course  I have reviewed the triage vital signs and the nursing notes.  Pertinent labs & imaging results that were available during my care  of the patient were reviewed by me and considered in my medical decision making (see chart for details).    MDM Rules/Calculators/A&P  Patient presenting here with dizziness that I suspect is related to peripheral vertigo.  His symptoms are worse when he stands and when he lays flat.  His work-up is unremarkable including laboratory studies and CT scan.  He seems to be feeling better after receiving meclizine and IV fluids.  Patient able to tolerate ambulation in the department without significant difficulty.  I feel as though discharge is appropriate with meclizine and as needed return.  Final Clinical Impression(s) / ED Diagnoses Final diagnoses:  None    Rx / DC Orders ED Discharge Orders     None        Veryl Speak, MD 09/06/20 [phone]

## 2020-09-05 NOTE — ED Triage Notes (Signed)
Pt with complaints of dizziness, right sided headache, and "whole body throbbing" x 1 hour.

## 2020-09-06 MED ORDER — MECLIZINE HCL 25 MG PO TABS: 25.0000 mg | ORAL_TABLET | Freq: Three times a day (TID) | ORAL | 0 refills | Status: DC | PRN

## 2020-09-06 NOTE — Discharge Instructions (Addendum)
Begin taking meclizine as prescribed as needed for dizziness.  Follow-up with primary doctor if symptoms have not resolved in the next week, and return to the ER if symptoms significantly worsen or change.

## 2020-09-06 NOTE — ED Notes (Signed)
Pt ambulated around unit with no difficulty. Pt states "I feel better".

## 2020-09-09 ENCOUNTER — Telehealth (HOSPITAL_COMMUNITY): Payer: Self-pay

## 2020-09-09 NOTE — Telephone Encounter (Signed)
Pt called stating he has been in the hospital with neck pain and his MD wants to see him before he returns to PT. Please cx 7/8 and 7/13.

## 2020-09-10 ENCOUNTER — Encounter (HOSPITAL_COMMUNITY): Payer: 59

## 2020-09-13 ENCOUNTER — Encounter (HOSPITAL_COMMUNITY): Payer: 59 | Admitting: Physical Therapy

## 2020-09-15 ENCOUNTER — Encounter (HOSPITAL_COMMUNITY): Payer: 59 | Admitting: Physical Therapy

## 2020-09-22 ENCOUNTER — Encounter (HOSPITAL_COMMUNITY): Payer: 59

## 2020-09-24 ENCOUNTER — Encounter (HOSPITAL_COMMUNITY): Payer: Self-pay | Admitting: Physical Therapy

## 2020-09-24 ENCOUNTER — Encounter (HOSPITAL_COMMUNITY): Payer: 59 | Admitting: Physical Therapy

## 2020-09-24 ENCOUNTER — Telehealth (HOSPITAL_COMMUNITY): Payer: Self-pay | Admitting: Physical Therapy

## 2020-09-24 NOTE — Telephone Encounter (Signed)
No show #1. Called and spoke with patient who has not followed up with his MD yet. States he follows up with them on 8/4. Reports he wants to cancel his therapy for the time being until after that apt. Discussed with patient that if we do not hear from him  by mid August we will close this episode of care.   11:12 AM, 09/24/20 Jerene Pitch, DPT Physical Therapy with [facility]  [phone] office

## 2020-09-27 ENCOUNTER — Ambulatory Visit (HOSPITAL_COMMUNITY): Payer: 59

## 2020-09-29 ENCOUNTER — Encounter (HOSPITAL_COMMUNITY): Payer: 59 | Admitting: Physical Therapy

## 2020-10-06 ENCOUNTER — Encounter (HOSPITAL_COMMUNITY): Payer: 59

## 2020-10-13 ENCOUNTER — Encounter (HOSPITAL_COMMUNITY): Payer: 59 | Admitting: Physical Therapy

## 2020-10-15 ENCOUNTER — Other Ambulatory Visit: Payer: Self-pay | Admitting: Otolaryngology

## 2020-11-01 ENCOUNTER — Other Ambulatory Visit: Payer: Self-pay | Admitting: Neurology

## 2020-11-01 DIAGNOSIS — M542 Cervicalgia: Secondary | ICD-10-CM

## 2020-11-15 ENCOUNTER — Ambulatory Visit
Admission: RE | Admit: 2020-11-15 | Discharge: 2020-11-15 | Disposition: A | Discharge status: Home / Self Care | Payer: 59 | Source: Ambulatory Visit | Attending: Neurology | Admitting: Neurology

## 2020-11-15 DIAGNOSIS — M542 Cervicalgia: Secondary | ICD-10-CM

## 2020-11-15 IMAGING — XA DG INJECT/[PERSON_NAME] INC NEEDLE/CATH/PLC EPI/CERV/THOR W/IMG
2 series · 2 of 2 positions shown · non-contrast
Comparison: none

CLINICAL DATA: Cervicalgia.  Bilateral upper extremity numbness.

[Series 1: ortho standard · 1 of 1 slices shown (1 of 2)]
[im 1/1]
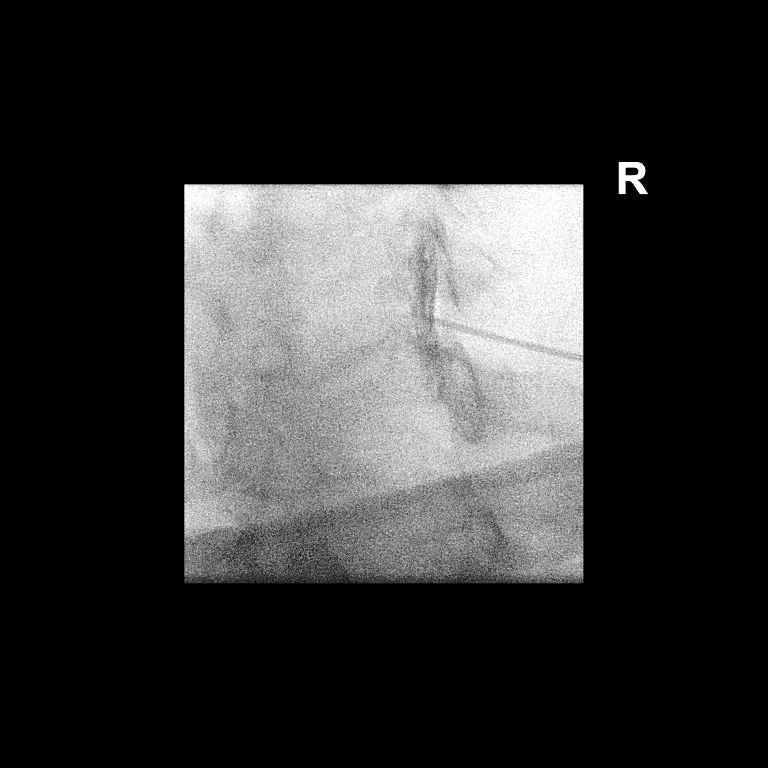

[Series 2: ortho standard · 1 of 1 slices shown (2 of 2)]
[im 1/1]
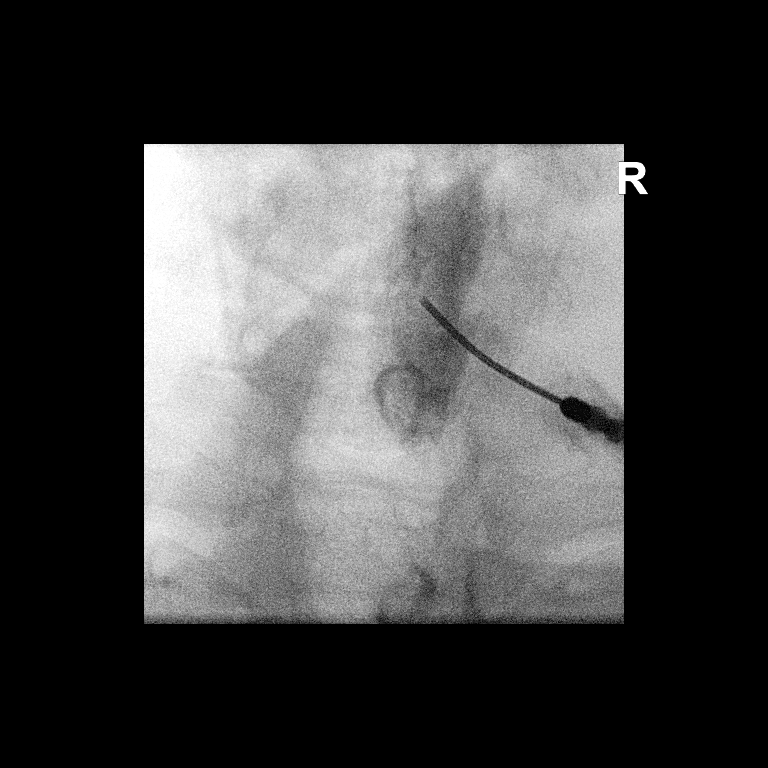

[2 of 2 positions shown; findings below may reference images not displayed]

FLUOROSCOPY TIME:  Radiation Exposure Index (as provided by the
fluoroscopic device): 22.64 uGy*m2

PROCEDURE:
CERVICAL EPIDURAL INJECTION

An interlaminar approach was performed on the right at C6-7. A 20
gauge epidural needle was advanced using loss-of-resistance
technique.

DIAGNOSTIC EPIDURAL INJECTION

Injection of Isovue-M 300 shows a good epidural pattern with spread
above and below the level of needle placement, primarily on the
right. No vascular opacification is seen. THERAPEUTIC

EPIDURAL INJECTION

1.5 ml of Kenalog 40 mixed with 1 ml of 1% Lidocaine and 2 ml of
normal saline were then instilled. The procedure was well-tolerated,
and the patient was discharged thirty minutes following the
injection in good condition.
IMPRESSION: Technically successful first epidural injection on the right at
C6-7.

## 2020-11-15 MED ORDER — TRIAMCINOLONE ACETONIDE 40 MG/ML IJ SUSP (RADIOLOGY)
60.0000 mg | Freq: Once | INTRAMUSCULAR | Status: AC
  Administered 2020-11-15: 60 mg via EPIDURAL

## 2020-11-15 MED ORDER — IOPAMIDOL (ISOVUE-M 300) INJECTION 61%
1.0000 mL | Freq: Once | INTRAMUSCULAR | Status: AC | PRN
  Administered 2020-11-15: 1 mL via EPIDURAL

## 2020-11-15 NOTE — Discharge Instructions (Signed)

## 2020-11-22 ENCOUNTER — Other Ambulatory Visit: Payer: Self-pay

## 2020-11-22 ENCOUNTER — Encounter (HOSPITAL_BASED_OUTPATIENT_CLINIC_OR_DEPARTMENT_OTHER): Payer: Self-pay | Admitting: Otolaryngology

## 2020-11-29 NOTE — Anesthesia Preprocedure Evaluation (Addendum)
Anesthesia Evaluation  Patient identified by MRN, date of birth, ID band Patient awake    Reviewed: Allergy & Precautions, NPO status , Patient's Chart, lab work & pertinent test results  Airway Mallampati: I  TM Distance: >3 FB Neck ROM: Full    Dental  (+) Missing, Chipped, Dental Advisory Given, Partial Upper,    Pulmonary sleep apnea (BiPAP) , former smoker,    Pulmonary exam normal breath sounds clear to auscultation       Cardiovascular hypertension, Pt. on medications Normal cardiovascular exam Rhythm:Regular Rate:Normal     Neuro/Psych negative neurological ROS  negative psych ROS   GI/Hepatic Neg liver ROS, GERD  Controlled,  Endo/Other  Morbid obesity (BMI 42)  Renal/GU negative Renal ROS  negative genitourinary   Musculoskeletal negative musculoskeletal ROS (+)   Abdominal (+) + obese,   Peds  Hematology negative hematology ROS (+)   Anesthesia Other Findings Prednisone?  Reproductive/Obstetrics                            Anesthesia Physical Anesthesia Plan  ASA: 3  Anesthesia Plan: General   Post-op Pain Management:    Induction: Intravenous  PONV Risk Score and Plan: 2 and Midazolam, Dexamethasone and Ondansetron  Airway Management Planned: Oral ETT  Additional Equipment:   Intra-op Plan:   Post-operative Plan: Extubation in OR  Informed Consent: I have reviewed the patients History and Physical, chart, labs and discussed the procedure including the risks, benefits and alternatives for the proposed anesthesia with the patient or authorized representative who has indicated his/her understanding and acceptance.     Dental advisory given  Plan Discussed with: CRNA  Anesthesia Plan Comments:         Anesthesia Quick Evaluation

## 2020-11-30 ENCOUNTER — Ambulatory Visit (HOSPITAL_BASED_OUTPATIENT_CLINIC_OR_DEPARTMENT_OTHER): Payer: 59 | Admitting: Anesthesiology

## 2020-11-30 ENCOUNTER — Ambulatory Visit (HOSPITAL_BASED_OUTPATIENT_CLINIC_OR_DEPARTMENT_OTHER)
Admission: RE | Admit: 2020-11-30 | Discharge: 2020-11-30 | Disposition: A | Discharge status: Home / Self Care | Payer: 59 | Attending: Otolaryngology | Admitting: Otolaryngology

## 2020-11-30 ENCOUNTER — Encounter (HOSPITAL_BASED_OUTPATIENT_CLINIC_OR_DEPARTMENT_OTHER): Payer: Self-pay | Admitting: Otolaryngology

## 2020-11-30 ENCOUNTER — Other Ambulatory Visit: Payer: Self-pay

## 2020-11-30 ENCOUNTER — Encounter (HOSPITAL_BASED_OUTPATIENT_CLINIC_OR_DEPARTMENT_OTHER)
Admission: RE | Disposition: A | Discharge status: Home / Self Care | Payer: Self-pay | Source: Home / Self Care | Attending: Otolaryngology

## 2020-11-30 DIAGNOSIS — R42 Dizziness and giddiness: Secondary | ICD-10-CM | POA: Insufficient documentation

## 2020-11-30 DIAGNOSIS — Z6841 Body Mass Index (BMI) 40.0 and over, adult: Secondary | ICD-10-CM | POA: Insufficient documentation

## 2020-11-30 DIAGNOSIS — Z87891 Personal history of nicotine dependence: Secondary | ICD-10-CM | POA: Diagnosis not present

## 2020-11-30 DIAGNOSIS — J310 Chronic rhinitis: Secondary | ICD-10-CM | POA: Diagnosis not present

## 2020-11-30 DIAGNOSIS — I10 Essential (primary) hypertension: Secondary | ICD-10-CM | POA: Insufficient documentation

## 2020-11-30 DIAGNOSIS — J342 Deviated nasal septum: Secondary | ICD-10-CM | POA: Diagnosis not present

## 2020-11-30 DIAGNOSIS — J343 Hypertrophy of nasal turbinates: Secondary | ICD-10-CM | POA: Insufficient documentation

## 2020-11-30 HISTORY — PX: NASAL SEPTOPLASTY W/ TURBINOPLASTY: SHX2070

## 2020-11-30 SURGERY — SEPTOPLASTY, NOSE, WITH NASAL TURBINATE REDUCTION
Anesthesia: General | Site: Nose | Laterality: Bilateral

## 2020-11-30 MED ORDER — FENTANYL CITRATE (PF) 100 MCG/2ML IJ SOLN
INTRAMUSCULAR | Status: AC
  Filled 2020-11-30: qty 2

## 2020-11-30 MED ORDER — DEXAMETHASONE SODIUM PHOSPHATE 10 MG/ML IJ SOLN
INTRAMUSCULAR | Status: AC
  Filled 2020-11-30: qty 1

## 2020-11-30 MED ORDER — CEFAZOLIN IN SODIUM CHLORIDE 3-0.9 GM/100ML-% IV SOLN
INTRAVENOUS | Status: AC
  Filled 2020-11-30: qty 100

## 2020-11-30 MED ORDER — FENTANYL CITRATE (PF) 100 MCG/2ML IJ SOLN
25.0000 ug | INTRAMUSCULAR | Status: DC | PRN
  Administered 2020-11-30 (×3): 50 ug via INTRAVENOUS

## 2020-11-30 MED ORDER — MUPIROCIN 2 % EX OINT
TOPICAL_OINTMENT | CUTANEOUS | Status: DC | PRN
  Administered 2020-11-30: 1 via TOPICAL

## 2020-11-30 MED ORDER — ACETAMINOPHEN 500 MG PO TABS
ORAL_TABLET | ORAL | Status: AC
  Filled 2020-11-30: qty 2

## 2020-11-30 MED ORDER — AMOXICILLIN 875 MG PO TABS: 875.0000 mg | ORAL_TABLET | Freq: Two times a day (BID) | ORAL | 0 refills | Status: AC

## 2020-11-30 MED ORDER — DEXAMETHASONE SODIUM PHOSPHATE 4 MG/ML IJ SOLN
INTRAMUSCULAR | Status: DC | PRN
  Administered 2020-11-30: 10 mg via INTRAVENOUS

## 2020-11-30 MED ORDER — OXYCODONE-ACETAMINOPHEN 5-325 MG PO TABS: 1.0000 | ORAL_TABLET | ORAL | 0 refills | Status: AC | PRN

## 2020-11-30 MED ORDER — PROPOFOL 10 MG/ML IV BOLUS
INTRAVENOUS | Status: DC | PRN
  Administered 2020-11-30: 200 mg via INTRAVENOUS

## 2020-11-30 MED ORDER — LIDOCAINE-EPINEPHRINE 1 %-1:100000 IJ SOLN
INTRAMUSCULAR | Status: DC | PRN
  Administered 2020-11-30: 3 mL

## 2020-11-30 MED ORDER — MIDAZOLAM HCL 2 MG/2ML IJ SOLN
INTRAMUSCULAR | Status: AC
  Filled 2020-11-30: qty 2

## 2020-11-30 MED ORDER — ROCURONIUM BROMIDE 10 MG/ML (PF) SYRINGE
PREFILLED_SYRINGE | INTRAVENOUS | Status: AC
  Filled 2020-11-30: qty 10

## 2020-11-30 MED ORDER — ROCURONIUM BROMIDE 100 MG/10ML IV SOLN
INTRAVENOUS | Status: DC | PRN
Start: 2020-11-30 — End: 2020-11-30
  Administered 2020-11-30: 80 mg via INTRAVENOUS

## 2020-11-30 MED ORDER — SUGAMMADEX SODIUM 500 MG/5ML IV SOLN
INTRAVENOUS | Status: DC | PRN
  Administered 2020-11-30: 500 mg via INTRAVENOUS

## 2020-11-30 MED ORDER — LIDOCAINE 2% (20 MG/ML) 5 ML SYRINGE
INTRAMUSCULAR | Status: AC
  Filled 2020-11-30: qty 5

## 2020-11-30 MED ORDER — LIDOCAINE HCL (CARDIAC) PF 100 MG/5ML IV SOSY
PREFILLED_SYRINGE | INTRAVENOUS | Status: DC | PRN
  Administered 2020-11-30: 100 mg via INTRAVENOUS

## 2020-11-30 MED ORDER — DEXTROSE 5 % IV SOLN
INTRAVENOUS | Status: DC | PRN
  Administered 2020-11-30: 3 g via INTRAVENOUS

## 2020-11-30 MED ORDER — PROPOFOL 10 MG/ML IV BOLUS
INTRAVENOUS | Status: AC
  Filled 2020-11-30: qty 20

## 2020-11-30 MED ORDER — MIDAZOLAM HCL 5 MG/5ML IJ SOLN
INTRAMUSCULAR | Status: DC | PRN
  Administered 2020-11-30: 2 mg via INTRAVENOUS

## 2020-11-30 MED ORDER — ONDANSETRON HCL 4 MG/2ML IJ SOLN
INTRAMUSCULAR | Status: AC
  Filled 2020-11-30: qty 2

## 2020-11-30 MED ORDER — LACTATED RINGERS IV SOLN: INTRAVENOUS | Status: DC

## 2020-11-30 MED ORDER — ONDANSETRON HCL 4 MG/2ML IJ SOLN
INTRAMUSCULAR | Status: DC | PRN
  Administered 2020-11-30: 4 mg via INTRAVENOUS

## 2020-11-30 MED ORDER — FENTANYL CITRATE (PF) 100 MCG/2ML IJ SOLN
INTRAMUSCULAR | Status: DC | PRN
  Administered 2020-11-30 (×2): 50 ug via INTRAVENOUS

## 2020-11-30 MED ORDER — ACETAMINOPHEN 500 MG PO TABS
1000.0000 mg | ORAL_TABLET | Freq: Once | ORAL | Status: AC
  Administered 2020-11-30: 1000 mg via ORAL

## 2020-11-30 MED ORDER — OXYMETAZOLINE HCL 0.05 % NA SOLN
NASAL | Status: DC | PRN
  Administered 2020-11-30: 1 via TOPICAL

## 2020-11-30 SURGICAL SUPPLY — 35 items
ATTRACTOMAT 16X20 MAGNETIC DRP (DRAPES) IMPLANT
CANISTER SUCT 1200ML W/VALVE (MISCELLANEOUS) ×2 IMPLANT
COAGULATOR SUCT 8FR VV (MISCELLANEOUS) ×2 IMPLANT
DECANTER SPIKE VIAL GLASS SM (MISCELLANEOUS) IMPLANT
DEFOGGER MIRROR 1QT (MISCELLANEOUS) ×2 IMPLANT
DRSG NASOPORE 8CM (GAUZE/BANDAGES/DRESSINGS) IMPLANT
DRSG TELFA 3X8 NADH (GAUZE/BANDAGES/DRESSINGS) IMPLANT
ELECT REM PT RETURN 9FT ADLT (ELECTROSURGICAL) ×2
ELECTRODE REM PT RTRN 9FT ADLT (ELECTROSURGICAL) ×1 IMPLANT
GAUZE 4X4 16PLY ~~LOC~~+RFID DBL (SPONGE) ×2 IMPLANT
GLOVE SURG ENC MOIS LTX SZ7.5 (GLOVE) ×2 IMPLANT
GLOVE SURG POLYISO LF SZ6.5 (GLOVE) ×4 IMPLANT
GLOVE SURG UNDER POLY LF SZ6.5 (GLOVE) ×2 IMPLANT
GLOVE SURG UNDER POLY LF SZ7 (GLOVE) ×2 IMPLANT
GOWN STRL REUS W/ TWL LRG LVL3 (GOWN DISPOSABLE) ×3 IMPLANT
GOWN STRL REUS W/ TWL XL LVL3 (GOWN DISPOSABLE) ×1 IMPLANT
GOWN STRL REUS W/TWL LRG LVL3 (GOWN DISPOSABLE) ×3
GOWN STRL REUS W/TWL XL LVL3 (GOWN DISPOSABLE) ×1
NEEDLE HYPO 25X1 1.5 SAFETY (NEEDLE) ×2 IMPLANT
NS IRRIG 1000ML POUR BTL (IV SOLUTION) ×2 IMPLANT
PACK BASIN DAY SURGERY FS (CUSTOM PROCEDURE TRAY) ×2 IMPLANT
PACK ENT DAY SURGERY (CUSTOM PROCEDURE TRAY) ×2 IMPLANT
SLEEVE SCD COMPRESS KNEE MED (STOCKING) ×2 IMPLANT
SPLINT NASAL AIRWAY SILICONE (MISCELLANEOUS) ×2 IMPLANT
SPONGE GAUZE 2X2 8PLY STRL LF (GAUZE/BANDAGES/DRESSINGS) ×2 IMPLANT
SPONGE NEURO XRAY DETECT 1X3 (DISPOSABLE) ×2 IMPLANT
SUT CHROMIC 4 0 P 3 18 (SUTURE) ×2 IMPLANT
SUT PLAIN 4 0 ~~LOC~~ 1 (SUTURE) ×2 IMPLANT
SUT PROLENE 3 0 PS 2 (SUTURE) ×2 IMPLANT
SUT VIC AB 4-0 P-3 18XBRD (SUTURE) IMPLANT
SUT VIC AB 4-0 P3 18 (SUTURE)
TOWEL GREEN STERILE FF (TOWEL DISPOSABLE) ×2 IMPLANT
TUBE SALEM SUMP 12R W/ARV (TUBING) IMPLANT
TUBE SALEM SUMP 16 FR W/ARV (TUBING) ×2 IMPLANT
YANKAUER SUCT BULB TIP NO VENT (SUCTIONS) ×2 IMPLANT

## 2020-11-30 NOTE — Anesthesia Procedure Notes (Signed)
Procedure Name: Intubation Date/Time: 11/30/2020 9:29 AM Performed by: Maryella Shivers, CRNA Pre-anesthesia Checklist: Patient identified, Emergency Drugs available, Suction available and Patient being monitored Patient Re-evaluated:Patient Re-evaluated prior to induction Oxygen Delivery Method: Circle system utilized Preoxygenation: Pre-oxygenation with 100% oxygen Induction Type: IV induction Ventilation: Mask ventilation without difficulty Laryngoscope Size: Mac and 4 Grade View: Grade III Tube type: Oral Tube size: 8.0 mm Number of attempts: 1 Airway Equipment and Method: Stylet and Oral airway Placement Confirmation: ETT inserted through vocal cords under direct vision, positive ETCO2 and breath sounds checked- equal and bilateral Secured at: 23 cm Tube secured with: Tape Dental Injury: Teeth and Oropharynx as per pre-operative assessment

## 2020-11-30 NOTE — Discharge Instructions (Addendum)
POSTOPERATIVE INSTRUCTIONS FOR PATIENTS HAVING NASAL OR SINUS OPERATIONS ACTIVITY: Restrict activity at home for the first two days, resting as much as possible. Light activity is best. You may usually return to work within a week. You should refrain from nose blowing, strenuous activity, or heavy lifting greater than 20lbs for a total of one week after your operation.  If sneezing cannot be avoided, sneeze with your mouth open. DISCOMFORT: You may experience a dull headache and pressure along with nasal congestion and discharge. These symptoms may be worse during the first week after the operation but may last as long as two to four weeks.  Please take Tylenol or the pain medication that has been prescribed for you. Do not take aspirin or aspirin containing medications since they may cause bleeding.  You may experience symptoms of post nasal drainage, nasal congestion, headaches and fatigue for two or three months after your operation.  BLEEDING: You may have some blood tinged nasal drainage for approximately two weeks after the operation.  The discharge will be worse for the first week.  Please call our office at [phone] or go to the nearest hospital emergency room if you experience any of the following: heavy, bright red blood from your nose or mouth that lasts longer than 15 minutes or coughing up or vomiting bright red blood or blood clots. GENERAL CONSIDERATIONS: A gauze dressing will be placed on your upper lip to absorb any drainage after the operation. You may need to change this several times a day.  If you do not have very much drainage, you may remove the dressing.  Remember that you may gently wipe your nose with a tissue and sniff in, but DO NOT blow your nose. Please keep all of your postoperative appointments.  Your final results after the operation will depend on proper follow-up.  The initial visit is usually 2 to 5 days after the operation.  During this visit, the remaining nasal  packing and internal septal splints will be removed.  Your nasal and sinus cavities will be cleaned.  During the second visit, your nasal and sinus cavities will be cleaned again. Have someone drive you to your first two postoperative appointments.  How you care for your nose after the operation will influence the results that you obtain.  You should follow all directions, take your medication as prescribed, and call our office [phone] with any problems or questions. You may be more comfortable sleeping with your head elevated on two pillows. Do not take any medications that we have not prescribed or recommended. WARNING SIGNS: if any of the following should occur, please call our office: Persistent fever greater than 102F. Persistent vomiting. Severe and constant pain that is not relieved by prescribed pain medication. Trauma to the nose. Rash or unusual side effects from any medicines.    Post Anesthesia Home Care Instructions  Activity: Get plenty of rest for the remainder of the day. A responsible individual must stay with you for 24 hours following the procedure.  For the next 24 hours, DO NOT: -Drive a car -Paediatric nurse -Drink alcoholic beverages -Take any medication unless instructed by your physician -Make any legal decisions or sign important papers.  Meals: Start with liquid foods such as gelatin or soup. Progress to regular foods as tolerated. Avoid greasy, spicy, heavy foods. If nausea and/or vomiting occur, drink only clear liquids until the nausea and/or vomiting subsides. Call your physician if vomiting continues.  Special Instructions/Symptoms: Your throat may feel dry  or sore from the anesthesia or the breathing tube placed in your throat during surgery. If this causes discomfort, gargle with warm salt water. The discomfort should disappear within 24 hours.  If you had a scopolamine patch placed behind your ear for the management of post- operative nausea  and/or vomiting:  1. The medication in the patch is effective for 72 hours, after which it should be removed.  Wrap patch in a tissue and discard in the trash. Wash hands thoroughly with soap and water. 2. You may remove the patch earlier than 72 hours if you experience unpleasant side effects which may include dry mouth, dizziness or visual disturbances. 3. Avoid touching the patch. Wash your hands with soap and water after contact with the patch.      Next does of Tylenol after 1:32pm for pain as needed at home.

## 2020-11-30 NOTE — H&P (Signed)
Cc: Chronic nasal obstruction  HPI: The patient is a 60 year old male who returns today for his follow-up evaluation. The patient was previously seen for chronic nasal obstruction and recurrent dizziness.  He was noted to have significant nasal septal deviation, bilateral septal spurs, and bilateral inferior turbinate hypertrophy.  He was treated with steroid nasal spray and allergy medications.  In addition, the patient was also complaining of recurrent dizziness.  He subsequently underwent vestibular neurodiagnostic testing.  The vestibular testing was consistent with both central and peripheral vestibular dysfunction. The patient returns today complaining of persistent nasal obstruction.  He has not noted any significant improvement with the nasal spray.  He reports resolution of his spinning vertigo.  However, he is still occasionally off balance.  Currently he denies any otalgia, otorrhea or vertigo. No other ENT, GI, or respiratory issue noted since the last visit.   Exam: General: Communicates without difficulty, well nourished, no acute distress. Head: Normocephalic, no evidence injury, no tenderness, facial buttresses intact without stepoff. Eyes: PERRL, EOMI. No scleral icterus, conjunctivae clear. Neuro: CN II exam reveals vision grossly intact. No nystagmus at any point of gaze. Ears: Auricles well formed without lesions. Ear canals are intact without mass or lesion. No erythema or edema is appreciated. The TMs are intact without fluid. Nose: External evaluation reveals normal support and skin without lesions. Dorsum is intact. Anterior rhinoscopy reveals congested and edematous mucosa over anterior aspect of the inferior turbinates and deviated nasal septum. No purulence is noted.  Oral:  Oral cavity and oropharynx are intact, symmetric, without erythema or edema. Mucosa is moist without lesions. Neck: Full range of motion without pain. There is no significant lymphadenopathy. No masses palpable.  Thyroid bed within normal limits to palpation. Parotid glands and submandibular glands equal bilaterally without mass. Trachea is midline. Neuro:  CN 2-12 grossly intact. Gait normal. Vestibular: No nystagmus at any point of gaze. Vestibular: No nystagmus at any point of gaze. Dix Hallpike negative.   Assessment  1.  Chronic rhinitis with nasal mucosal congestion, nasal septal deviation and bilateral inferior turbinate hypertrophy.  2.  More than 95% of his nasal passageways are obstructed bilaterally.  3.  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  The patient's vertigo has mostly resolved.  He is having occasional balance difficulty.   Plan  1.  The physical exam findings and the vestibular neurodiagnostic testing results are reviewed with the patient.   2.  Due to his persistent nasal obstruction, he will benefit from undergoing surgical intervention with septoplasty and bilateral turbinate reduction.  The risks, benefits, alternatives and details of the procedures are reviewed with the patient.  Questions are invited and answered.  3.  The patient would like to proceed with the procedures.

## 2020-11-30 NOTE — Op Note (Signed)
DATE OF PROCEDURE: 11/30/2020  OPERATIVE REPORT   SURGEON: Leta Baptist, MD   PREOPERATIVE DIAGNOSES:  1. Severe nasal septal deviation.  2. Bilateral inferior turbinate hypertrophy.  3. Chronic nasal obstruction.  POSTOPERATIVE DIAGNOSES:  1. Severe nasal septal deviation.  2. Bilateral inferior turbinate hypertrophy.  3. Chronic nasal obstruction.  PROCEDURE PERFORMED:  1. Septoplasty.  2. Bilateral partial inferior turbinate resection.   ANESTHESIA: General endotracheal tube anesthesia.   COMPLICATIONS: None.   ESTIMATED BLOOD LOSS: 250 mL.   INDICATION FOR PROCEDURE: Micheal Elena. is a 60 y.o. male with a history of chronic nasal obstruction. The patient was treated with antihistamine, decongestant, and steroid nasal sprays. However, the patient continued to be symptomatic. On examination, the patient was noted to have bilateral severe inferior turbinate hypertrophy and significant nasal septal deviation, causing significant nasal obstruction. Based on the above findings, the decision was made for the patient to undergo the above-stated procedures. The risks, benefits, alternatives, and details of the procedures were discussed with the patient. Questions were invited and answered. Informed consent was obtained.   DESCRIPTION OF PROCEDURE: The patient was taken to the operating room and placed supine on the operating table. General endotracheal tube anesthesia was administered by the anesthesiologist. The patient was positioned, and prepped and draped in the standard fashion for nasal surgery. Pledgets soaked with Afrin were placed in both nasal cavities for decongestion. The pledgets were subsequently removed.   Examination of the nasal cavity revealed a severe nasal septal deviation. 1% lidocaine with 1:100,000 epinephrine was injected onto the nasal septum bilaterally. A hemitransfixion incision was made on the left side. The mucosal flap was carefully elevated on the left side. A  cartilaginous incision was made 1 cm superior to the caudal margin of the nasal septum. Mucosal flap was also elevated on the right side in the similar fashion. It should be noted that due to the severe septal deviation, the deviated portion of the cartilaginous and bony septum had to be removed in piecemeal fashion. Once the deviated portions were removed, a straight midline septum was achieved. The septum was then quilted with 4-0 plain gut sutures. The hemitransfixion incision was closed with interrupted 4-0 chromic sutures.   The inferior one half of both hypertrophied inferior turbinate was crossclamped with a Kelly clamp. The inferior one half of each inferior turbinate was then resected with a pair of cross cutting scissors. Hemostasis was achieved with a suction cautery device. Doyle splints were applied to the nasal septum.  The care of the patient was turned over to the anesthesiologist. The patient was awakened from anesthesia without difficulty. The patient was extubated and transferred to the recovery room in good condition.   OPERATIVE FINDINGS: Severe nasal septal deviation and bilateral inferior turbinate hypertrophy.   SPECIMEN: None.   FOLLOWUP CARE: The patient be discharged home once he is awake and alert. The patient will be placed on Percocet p.r.n. pain, and amoxicillin 875 mg p.o. b.i.d. for 3 days. The patient will follow up in my office in 3 days for splint removal.   Micheal Burkel Raynelle Bring, MD

## 2020-11-30 NOTE — Anesthesia Postprocedure Evaluation (Signed)
Anesthesia Post Note  Patient: Micheal Ross.  Procedure(s) Performed: NASAL SEPTOPLASTY WITH BILATERAL  TURBINATE REDUCTION (Bilateral: Nose)     Patient location during evaluation: PACU Anesthesia Type: General Level of consciousness: awake and alert Pain management: pain level controlled Vital Signs Assessment: post-procedure vital signs reviewed and stable Respiratory status: spontaneous breathing, nonlabored ventilation, respiratory function stable and patient connected to nasal cannula oxygen Cardiovascular status: blood pressure returned to baseline and stable Postop Assessment: no apparent nausea or vomiting Anesthetic complications: no   No notable events documented.  Last Vitals:  Vitals:   11/30/20 1145 11/30/20 1200  BP: 123/73 122/69  Pulse: 79 81  Resp: (!) 22 20  Temp:    SpO2: 91% 92%    Last Pain:  Vitals:   11/30/20 1145  TempSrc:   PainSc: 4                  Berenize Gatlin L Anurag Scarfo

## 2020-11-30 NOTE — Transfer of Care (Signed)
Immediate Anesthesia Transfer of Care Note  Patient: Micheal Ross.  Procedure(s) Performed: NASAL SEPTOPLASTY WITH BILATERAL  TURBINATE REDUCTION (Bilateral: Nose)  Patient Location: PACU  Anesthesia Type:General  Level of Consciousness: sedated  Airway & Oxygen Therapy: Patient Spontanous Breathing and Patient connected to face mask oxygen  Post-op Assessment: Report given to RN and Post -op Vital signs reviewed and stable  Post vital signs: Reviewed and stable  Last Vitals:  Vitals Value Taken Time  BP 128/68 11/30/20 1045  Temp    Pulse 83 11/30/20 1046  Resp 22 11/30/20 1046  SpO2 100 % 11/30/20 1046  Vitals shown include unvalidated device data.  Last Pain:  Vitals:   11/30/20 0719  TempSrc: Oral  PainSc: 0-No pain      Patients Stated Pain Goal: 3 (35/68/61 6837)  Complications: No notable events documented.

## 2020-12-02 ENCOUNTER — Encounter (HOSPITAL_BASED_OUTPATIENT_CLINIC_OR_DEPARTMENT_OTHER): Payer: Self-pay | Admitting: Otolaryngology

## 2021-01-01 ENCOUNTER — Encounter (HOSPITAL_COMMUNITY): Payer: Self-pay | Admitting: Emergency Medicine

## 2021-01-01 ENCOUNTER — Emergency Department (HOSPITAL_COMMUNITY): Payer: 59

## 2021-01-01 ENCOUNTER — Other Ambulatory Visit: Payer: Self-pay

## 2021-01-01 ENCOUNTER — Emergency Department (HOSPITAL_COMMUNITY)
Admission: EM | Admit: 2021-01-01 | Discharge: 2021-01-01 | Disposition: A | Discharge status: Home / Self Care | Payer: 59 | Attending: Emergency Medicine | Admitting: Emergency Medicine

## 2021-01-01 DIAGNOSIS — Z79899 Other long term (current) drug therapy: Secondary | ICD-10-CM | POA: Diagnosis not present

## 2021-01-01 DIAGNOSIS — M542 Cervicalgia: Secondary | ICD-10-CM | POA: Insufficient documentation

## 2021-01-01 DIAGNOSIS — R791 Abnormal coagulation profile: Secondary | ICD-10-CM | POA: Diagnosis not present

## 2021-01-01 DIAGNOSIS — Z87891 Personal history of nicotine dependence: Secondary | ICD-10-CM | POA: Insufficient documentation

## 2021-01-01 DIAGNOSIS — M5030 Other cervical disc degeneration, unspecified cervical region: Secondary | ICD-10-CM

## 2021-01-01 DIAGNOSIS — I10 Essential (primary) hypertension: Secondary | ICD-10-CM | POA: Insufficient documentation

## 2021-01-01 DIAGNOSIS — X501XXA Overexertion from prolonged static or awkward postures, initial encounter: Secondary | ICD-10-CM | POA: Diagnosis not present

## 2021-01-01 DIAGNOSIS — R202 Paresthesia of skin: Secondary | ICD-10-CM | POA: Insufficient documentation

## 2021-01-01 LAB — COMPREHENSIVE METABOLIC PANEL
ALT: 21 U/L (ref 0–44)
AST: 20 U/L (ref 15–41)
Albumin: 4.1 g/dL (ref 3.5–5.0)
Alkaline Phosphatase: 69 U/L (ref 38–126)
Anion gap: 8 (ref 5–15)
BUN: 9 mg/dL (ref 8–23)
CO2: 26 mmol/L (ref 22–32)
Calcium: 9.3 mg/dL (ref 8.9–10.3)
Chloride: 105 mmol/L (ref 98–111)
Creatinine, Ser: 1.16 mg/dL (ref 0.61–1.24)
GFR, Estimated: >60 mL/min (ref >60)
Glucose, Bld: 102 mg/dL — ABNORMAL HIGH (ref 70–99)
Potassium: 3.6 mmol/L (ref 3.5–5.1)
Sodium: 139 mmol/L (ref 135–145)
Total Bilirubin: 0.8 mg/dL (ref 0.3–1.2)
Total Protein: 7.1 g/dL (ref 6.5–8.1)

## 2021-01-01 LAB — PROTIME-INR
INR: 1 (ref 0.8–1.2)
Prothrombin Time: 13.6 seconds (ref 11.4–15.2)

## 2021-01-01 LAB — DIFFERENTIAL
Abs Immature Granulocytes: 0.01 10*3/uL (ref 0.00–0.07)
Basophils Absolute: 0 10*3/uL (ref 0.0–0.1)
Basophils Relative: 1 %
Eosinophils Absolute: 0.1 10*3/uL (ref 0.0–0.5)
Eosinophils Relative: 2 %
Immature Granulocytes: 0 %
Lymphocytes Relative: 16 %
Lymphs Abs: 1 10*3/uL (ref 0.7–4.0)
Monocytes Absolute: 0.5 10*3/uL (ref 0.1–1.0)
Monocytes Relative: 8 %
Neutro Abs: 4.7 10*3/uL (ref 1.7–7.7)
Neutrophils Relative %: 73 %

## 2021-01-01 LAB — CBC
HCT: 40.9 % (ref 39.0–52.0)
Hemoglobin: 14.3 g/dL (ref 13.0–17.0)
MCH: 31.4 pg (ref 26.0–34.0)
MCHC: 35 g/dL (ref 30.0–36.0)
MCV: 89.9 fL (ref 80.0–100.0)
Platelets: 225 10*3/uL (ref 150–400)
RBC: 4.55 MIL/uL (ref 4.22–5.81)
RDW: 12.5 % (ref 11.5–15.5)
WBC: 6.3 10*3/uL (ref 4.0–10.5)
nRBC: 0 % (ref 0.0–0.2)

## 2021-01-01 LAB — APTT: aPTT: 30 seconds (ref 24–36)

## 2021-01-01 IMAGING — CR DG CHEST 2V
2 series · 2 of 2 positions shown · non-contrast
Comparison: None.

CLINICAL DATA: left arm numbness / weakness

EXAM:
CHEST - 2 VIEW

[chest pa]
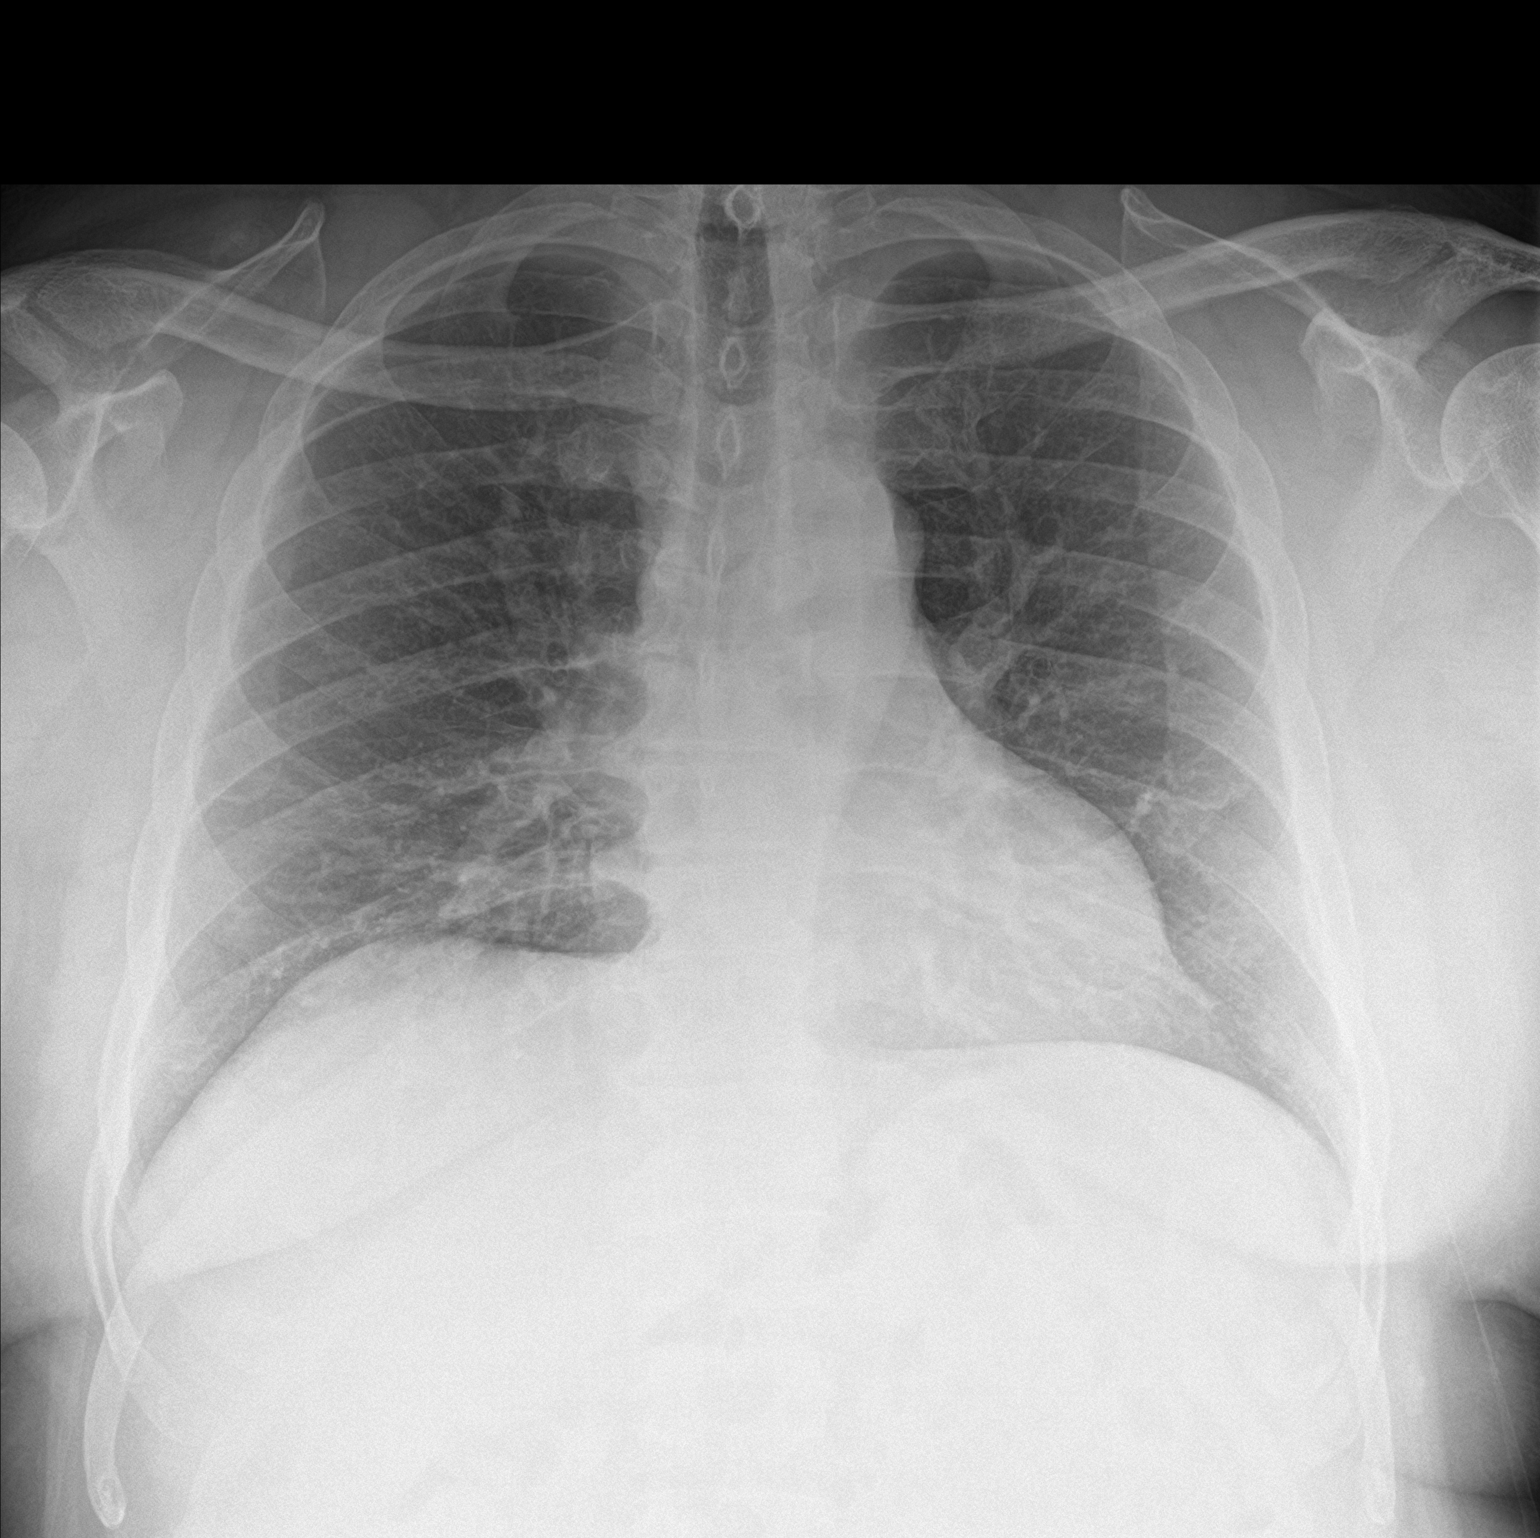

[chest lat]
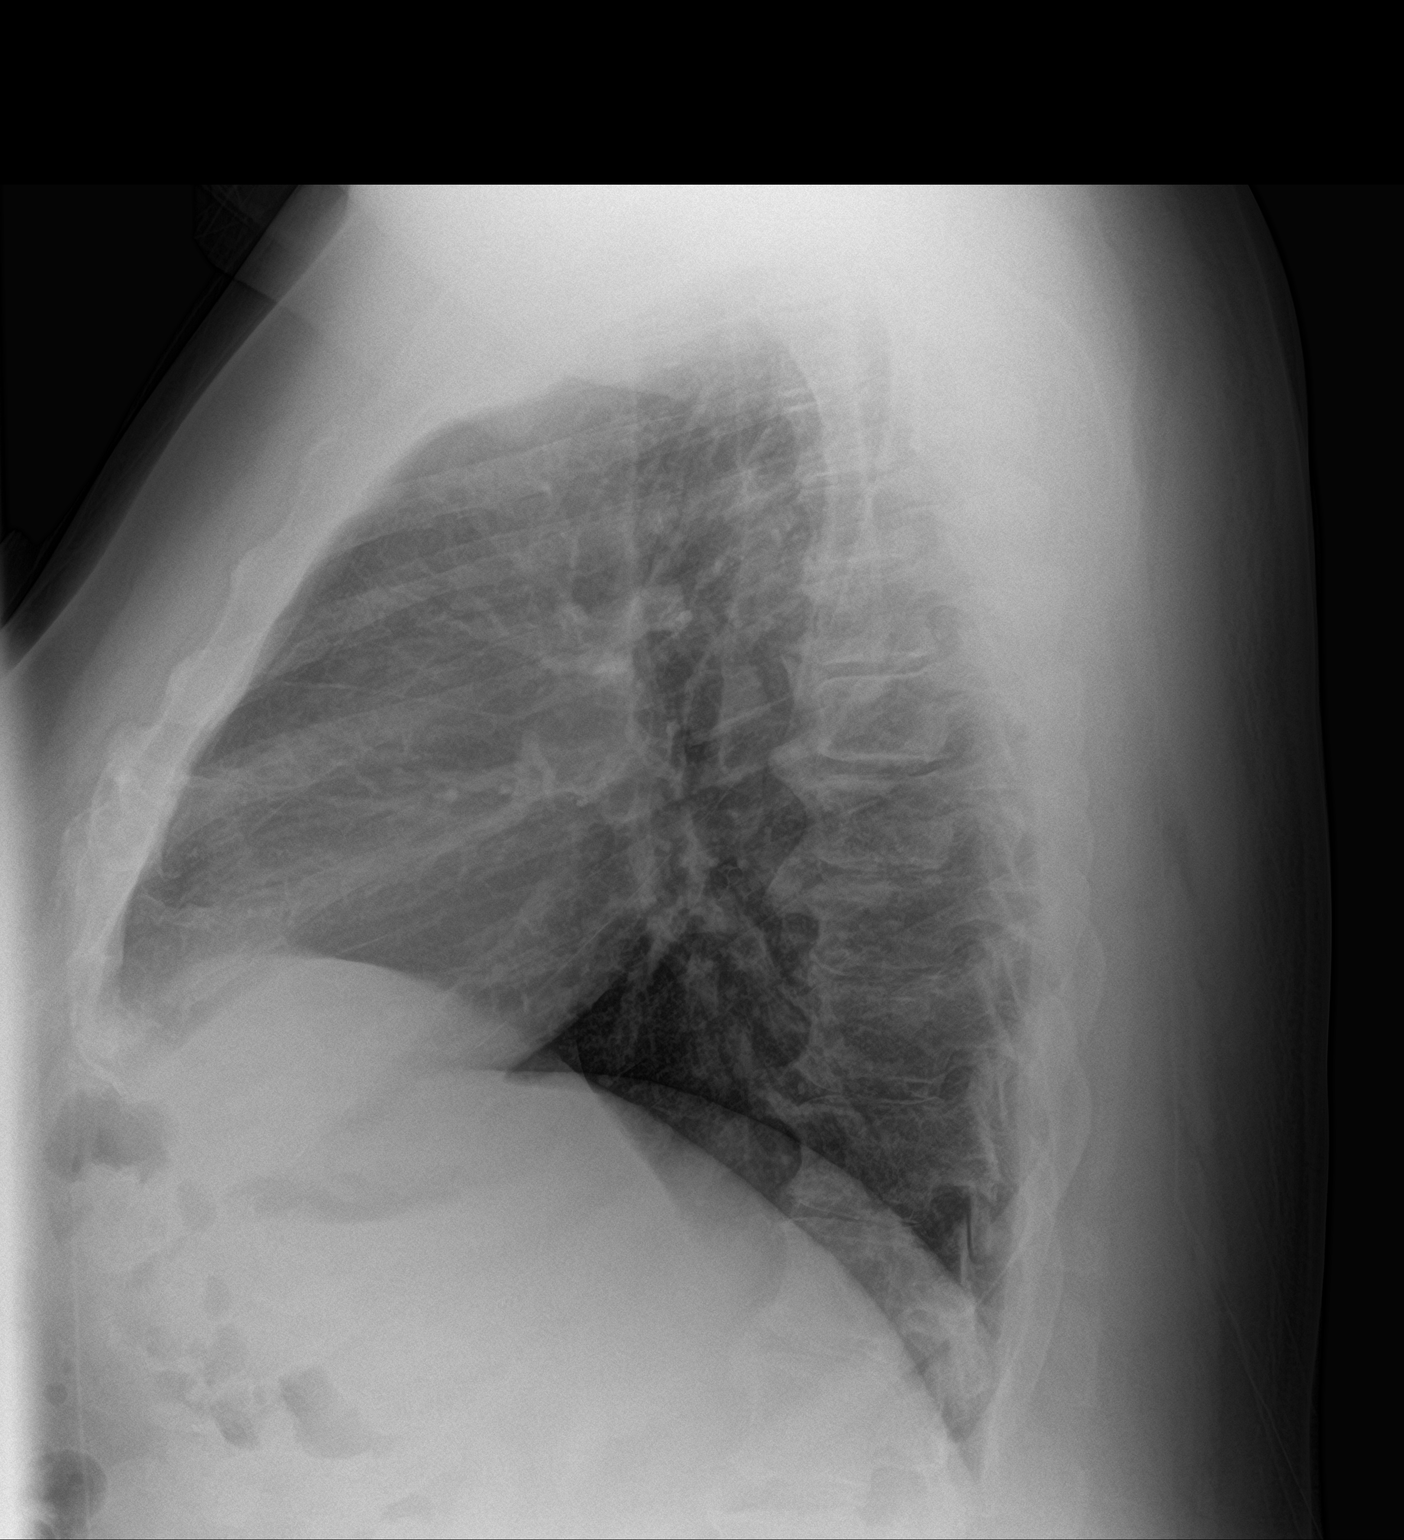

[2 of 2 positions shown; findings below may reference images not displayed]

FINDINGS: The cardiomediastinal silhouette is within normal limits. No pleural
effusion. No pneumothorax. No mass or consolidation. No acute
osseous abnormality.
IMPRESSION: No acute findings in the chest.

## 2021-01-01 IMAGING — MR MR CERVICAL SPINE W/O CM
4 of 5 series · 18 of 48 positions shown · non-contrast
Comparison: MRI of the cervical spine [DATE]

CLINICAL DATA: Myelopathy, acute or progressive. Cervical
radiculopathy. Numbness or tingling.

EXAM:
MRI CERVICAL SPINE WITHOUT CONTRAST
TECHNIQUE: Multiplanar, multisequence MR imaging of the cervical spine was
performed. No intravenous contrast was administered.

[Series 11: T2 · sagittal · 3.0mm · 0.43mm/px · 4 of 17 slices shown (1 of 2)]
[im 1/17]
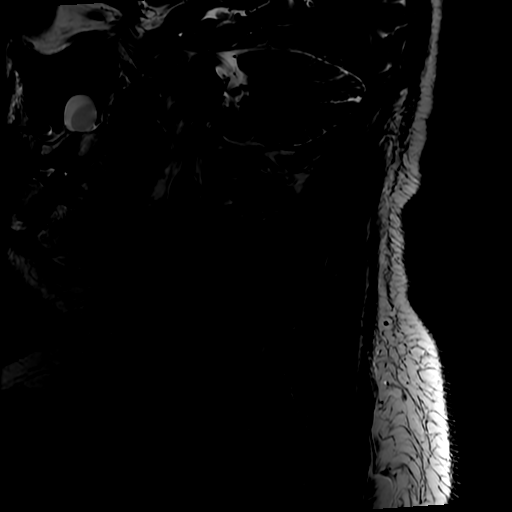
[im 6/17]
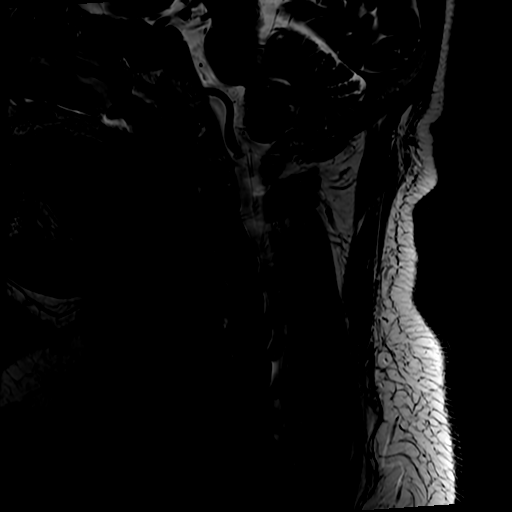
[im 11/17]
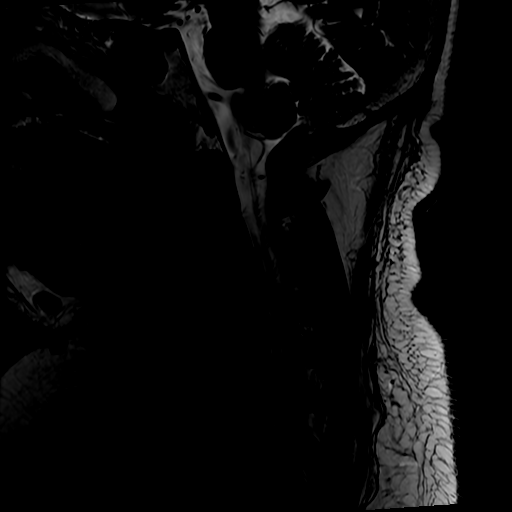
[im 17/17]
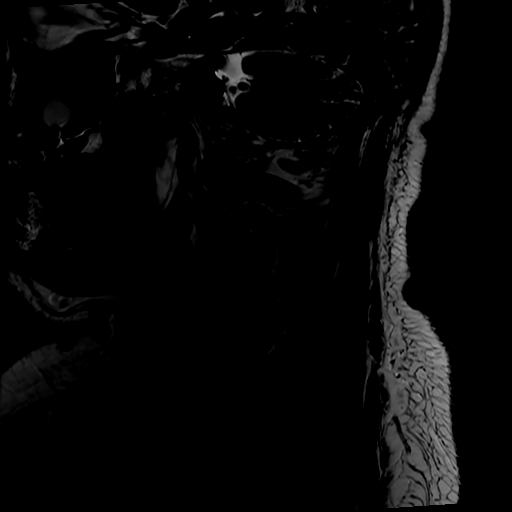

[Series 12: FLAIR · sagittal · 3.0mm · 0.43mm/px · 3 of 17 slices shown]
[im 1/17]
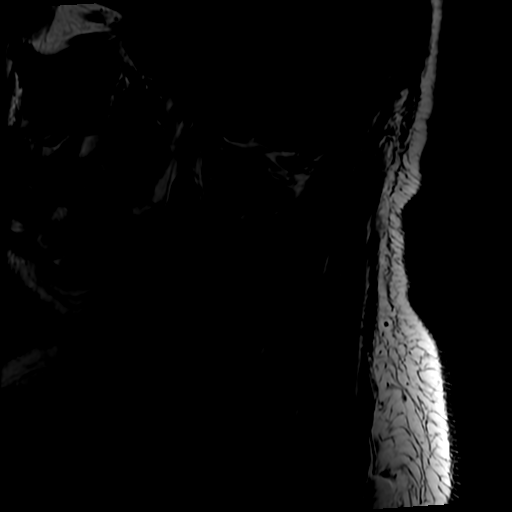
[im 11/17]
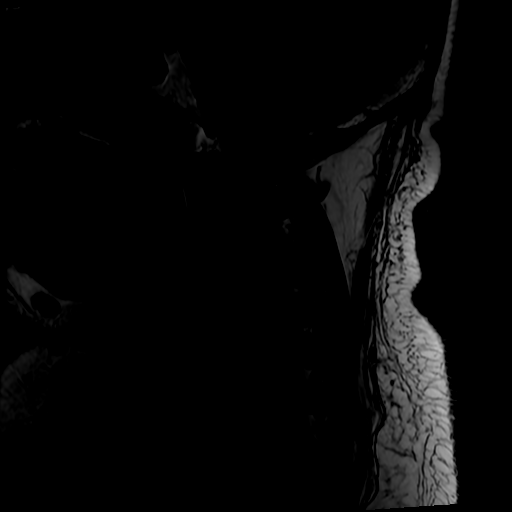
[im 17/17]
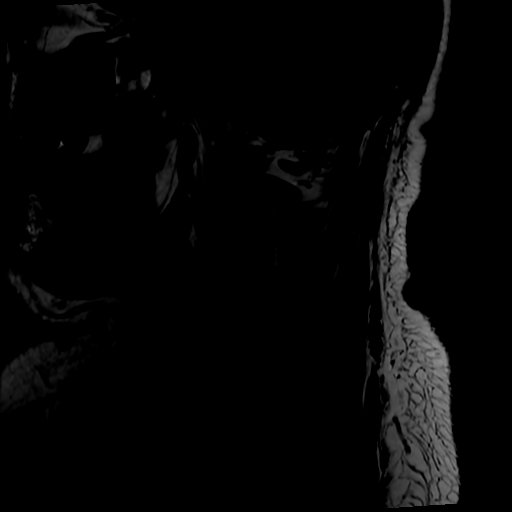

[Series 13: STIR · sagittal · 3.0mm · 0.43mm/px · 3 of 17 slices shown]
[im 1/17]
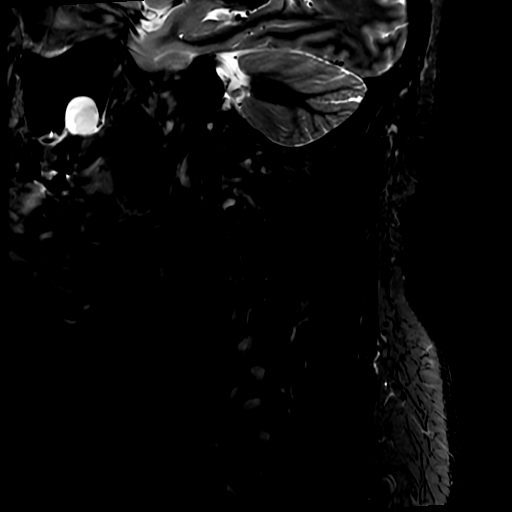
[im 9/17]
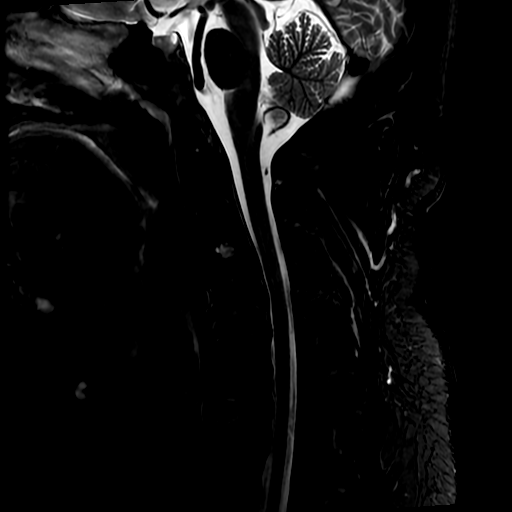
[im 17/17]
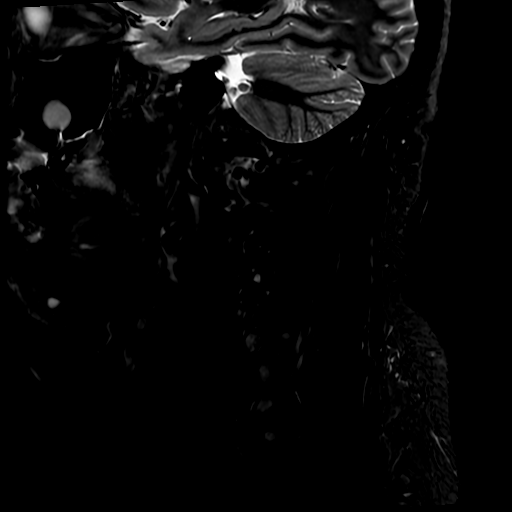

[Series 15: T2 · axial · 3.0mm · 0.35mm/px · z∈[-214,-126]mm · 8 of 33 slices shown (2 of 2)]
[im 1/33]
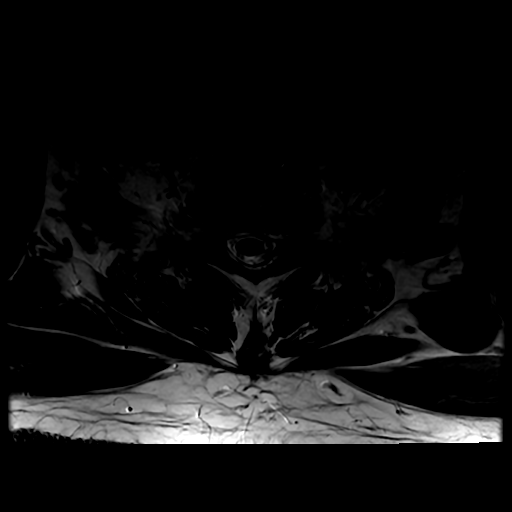
[im 5/33]
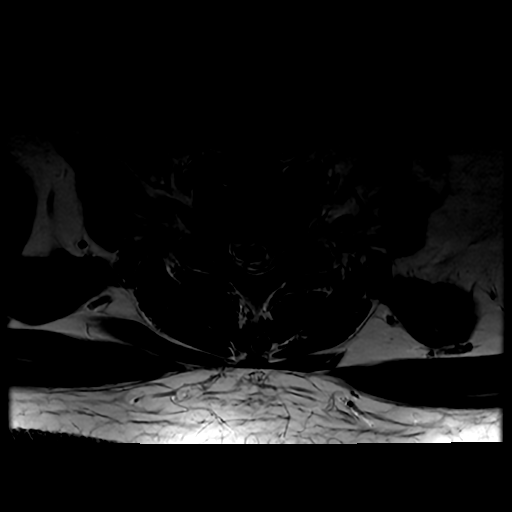
[im 9/33]
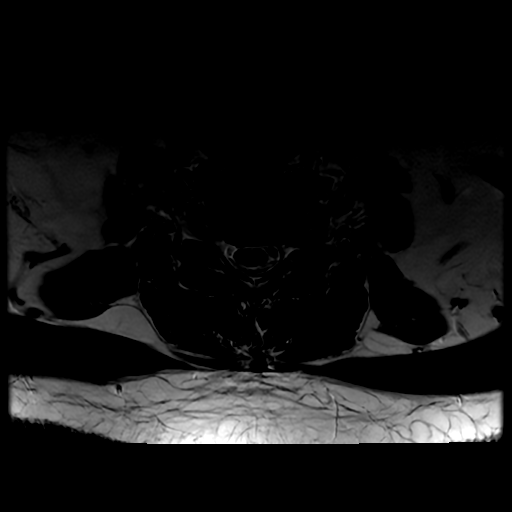
[im 13/33]
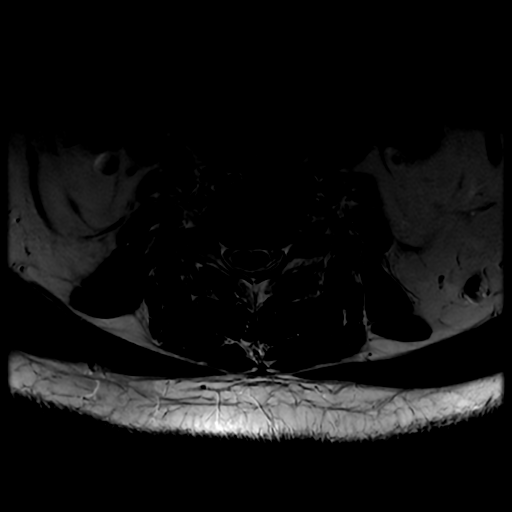
[im 17/33]
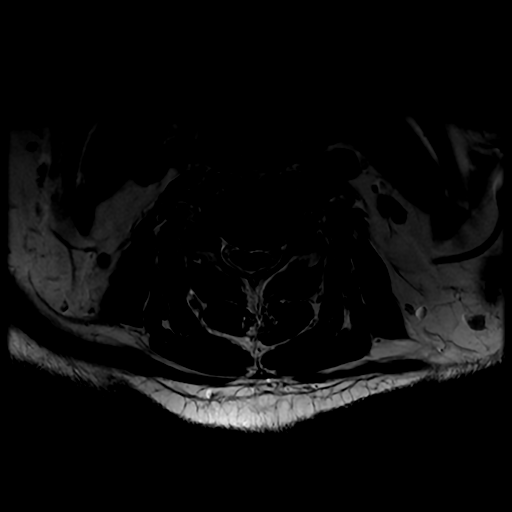
[im 21/33]
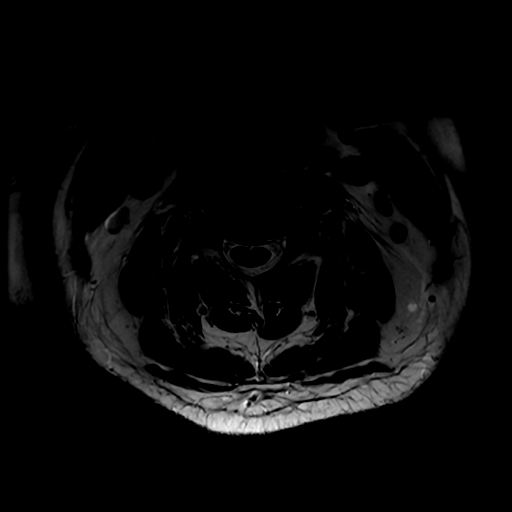
[im 25/33]
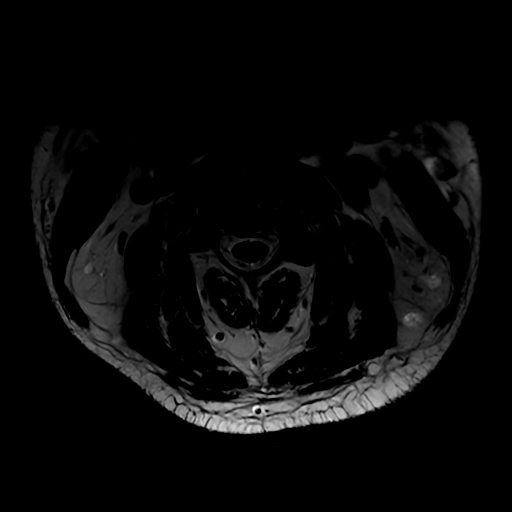
[im 29/33]
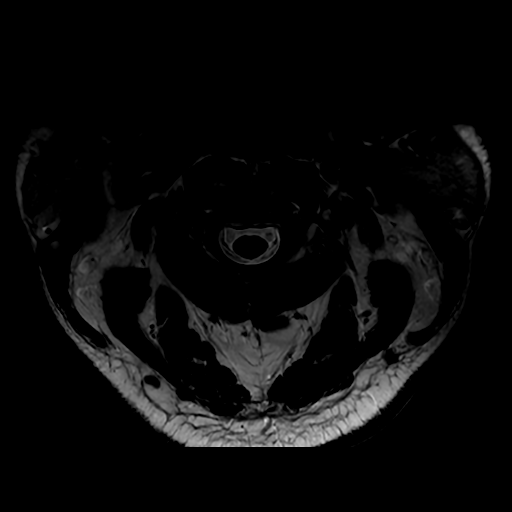

[18 of 48 positions shown; findings below may reference images not displayed]

FINDINGS: Alignment: No significant listhesis is present. Straightening and
some reversal of the normal cervical lordosis is noted.

Vertebrae: Chronic endplate changes are stable. Vertebral body
heights maintained. Focal T2 hyperintense lesion at C3 is stable.

Cord: Normal signal and morphology.

Posterior Fossa, vertebral arteries, paraspinal tissues:
Craniocervical junction is normal. Flow is present in the vertebral
arteries bilaterally. Visualized intracranial contents are normal.

Disc levels:

C2-3: A shallow central soft disc protrusion is present. Moderate
left-sided facet spurring contributes to mild left foraminal
narrowing.

C3-4: A broad-based disc osteophyte complex partially effaces the
ventral CSF. Uncovertebral and facet spurring result in moderate
right and mild left foraminal narrowing.

C4-5: A broad-based disc osteophyte complex partially effaces the
ventral CSF. Uncovertebral and facet hypertrophy result in moderate
foraminal stenosis bilaterally, left greater than right.

C5-6: A broad-based disc osteophyte complex is worse on the right.
Moderate foraminal narrowing is worse on the right. Partial
effacement of the ventral CSF noted.

C6-7: A rightward disc osteophyte complex partially effaces the
ventral CSF. Mild right foraminal narrowing is stable.

C7-T1: Asymmetric left-sided uncovertebral disease is present.
Moderate foraminal narrowing is worse left than right.
IMPRESSION: 1. Stable multilevel spondylosis of the cervical spine as described.
2. Mild left foraminal narrowing at C2-3.
3. Moderate right and mild left foraminal narrowing at C3-4.
4. Moderate foraminal narrowing bilaterally at C4-5 and C5-6 is
worse on the right.
5. Mild right foraminal narrowing at C6-7.
6. Moderate foraminal narrowing bilaterally at C7-T1 is worse on the
left.

## 2021-01-01 MED ORDER — SODIUM CHLORIDE 0.9% FLUSH: 3.0000 mL | Freq: Once | INTRAVENOUS | Status: DC

## 2021-01-01 MED ORDER — METHYLPREDNISOLONE 4 MG PO TBPK: ORAL_TABLET | ORAL | 0 refills | Status: DC

## 2021-01-01 MED ORDER — METHOCARBAMOL 500 MG PO TABS: 500.0000 mg | ORAL_TABLET | Freq: Two times a day (BID) | ORAL | 0 refills | Status: DC

## 2021-01-01 NOTE — Discharge Instructions (Signed)
Take steroid Dosepak as directed on packaging and you can use prescribed muscle relaxer as needed for spasm and discomfort as well, this medication can cause drowsiness.  Follow-up with neurosurgery clinic.  Return for any new or worsening symptoms.

## 2021-01-01 NOTE — ED Provider Notes (Signed)
Care assumed from [place], please see his note for full details, but in brief Micheal Ross. is a 60 y.o. male with a history of cervical radiculopathy, disc disease and meningioma s/p resection, presents for evaluation of numbness and tingling in his left arm associated with neck pain that started at 1 PM yesterday.  Initially he had complete numbness of the arm but now he reports this seems to be improved somewhat but he has still has decreased sensation.  No weakness.  On exam patient has some decrease sensation in the left upper extremity but no other neurologic deficits noted.  Lab work is reassuring.  MRIs of the brain and cervical spine pending at shift change.  If reassuring patient can be discharged home with outpatient follow-up and symptomatic management.   BP 118/85 (BP Location: Right Arm)   Pulse 78   Temp 98.8 F (37.1 C) (Oral)   Resp 20   SpO2 98%    ED Course/Procedures   Labs Reviewed  COMPREHENSIVE METABOLIC PANEL - Abnormal; Notable for the following components:      Result Value   Glucose, Bld 102 (*)    All other components within normal limits  PROTIME-INR  APTT  CBC  DIFFERENTIAL  I-STAT CHEM 8, ED   DG Chest 2 View  Result Date: 01/01/2021 CLINICAL DATA:  left arm numbness / weakness EXAM: CHEST - 2 VIEW COMPARISON:  None. FINDINGS: The cardiomediastinal silhouette is within normal limits. No pleural effusion. No pneumothorax. No mass or consolidation. No acute osseous abnormality. IMPRESSION: No acute findings in the chest. Electronically Signed   By: Albin Felling M.D.   On: 01/01/2021 10:06   MR BRAIN WO CONTRAST  Result Date: 01/01/2021 CLINICAL DATA:  Neuro deficit, acute, stroke suspected. Numbness or tingling. Paresthesias. Personal history of meningioma resection. EXAM: MRI HEAD WITHOUT CONTRAST TECHNIQUE: Multiplanar, multiecho pulse sequences of the brain and surrounding structures were obtained without intravenous contrast.  COMPARISON:  CT head 09/05/2020.  MR head 03/12/2020. FINDINGS: Brain: Left frontal encephalomalacia subjacent to the tumor resection is stable. No other acute infarct, hemorrhage, or mass lesion is present. The ventricles are of normal size. No significant extraaxial fluid collection is present. Volume loss is present with ex vacuo dilation of the frontal horn left lateral ventricle. Minimal periventricular white matter changes are noted bilaterally. Vascular: Flow is present in the major intracranial arteries. Skull and upper cervical spine: The craniocervical junction is normal. Upper cervical spine is within normal limits. Marrow signal is unremarkable. Sinuses/Orbits: Polyps or mucous retention cysts are again noted in the maxillary sinuses bilaterally. The paranasal sinuses and mastoid air cells are otherwise clear. Bilateral exophthalmos is present. No retro-orbital mass is present. The globes and orbits are otherwise within normal limits. IMPRESSION: 1. No acute intracranial abnormality or significant interval change. 2. Stable left frontal encephalomalacia subjacent to the tumor resection. 3. Bilateral exophthalmos. Electronically Signed   By: San Morelle M.D.   On: 01/01/2021 15:47   MR Cervical Spine Wo Contrast  Result Date: 01/01/2021 CLINICAL DATA:  Myelopathy, acute or progressive. Cervical radiculopathy. Numbness or tingling. EXAM: MRI CERVICAL SPINE WITHOUT CONTRAST TECHNIQUE: Multiplanar, multisequence MR imaging of the cervical spine was performed. No intravenous contrast was administered. COMPARISON:  MRI of the cervical spine 03/12/2020 FINDINGS: Alignment: No significant listhesis is present. Straightening and some reversal of the normal cervical lordosis is noted. Vertebrae: Chronic endplate changes are stable. Vertebral body heights maintained. Focal T2 hyperintense lesion at C3 is  stable. Cord: Normal signal and morphology. Posterior Fossa, vertebral arteries, paraspinal  tissues: Craniocervical junction is normal. Flow is present in the vertebral arteries bilaterally. Visualized intracranial contents are normal. Disc levels: C2-3: A shallow central soft disc protrusion is present. Moderate left-sided facet spurring contributes to mild left foraminal narrowing. C3-4: A broad-based disc osteophyte complex partially effaces the ventral CSF. Uncovertebral and facet spurring result in moderate right and mild left foraminal narrowing. C4-5: A broad-based disc osteophyte complex partially effaces the ventral CSF. Uncovertebral and facet hypertrophy result in moderate foraminal stenosis bilaterally, left greater than right. C5-6: A broad-based disc osteophyte complex is worse on the right. Moderate foraminal narrowing is worse on the right. Partial effacement of the ventral CSF noted. C6-7: A rightward disc osteophyte complex partially effaces the ventral CSF. Mild right foraminal narrowing is stable. C7-T1: Asymmetric left-sided uncovertebral disease is present. Moderate foraminal narrowing is worse left than right. IMPRESSION: 1. 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 2. Mild left foraminal narrowing at C2-3. 3. Moderate right and mild left foraminal narrowing at C3-4. 4. Moderate foraminal narrowing bilaterally at C4-5 and C5-6 is worse on the right. 5. Mild right foraminal narrowing at C6-7. 6. Moderate foraminal narrowing bilaterally at C7-T1 is worse on the left. Electronically Signed   By: San Morelle M.D.   On: 01/01/2021 15:55     Procedures  MDM   MRI of the brain with no acute intracranial abnormality or significant interval changes, stable left frontal encephalomalacia adjacent to tumor resection.  MRI of the cervical spine is stable compared to MRI in January of this year, unchanged multilevel spondylosis and foraminal narrowing.  No abnormal cord signal.  I am reassured the patient's sensory changes have already improved somewhat  today.  Patient's MRI imaging is overall reassuring.  We will treat with Medrol Dosepak and muscle relaxer and have patient follow-up with neurosurgery.  He has been prescribed gabapentin by his neurologist previously but has not taken this medication discussed that this may be beneficial as well.  Discussed appropriate return precautions.  Patient and wife expressed understanding and agreement.  Discharged home in good condition.       Jacqlyn Larsen, PA-C 01/01/21 2214    Lucrezia Starch, MD 01/02/21 [phone]

## 2021-01-01 NOTE — ED Notes (Signed)
Pt transported to MRI 

## 2021-01-01 NOTE — ED Provider Notes (Signed)
[facility] EMERGENCY DEPARTMENT Provider Note   CSN: [id number] Arrival date & time: 01/01/21  4166     History Chief Complaint  Patient presents with   Headache   arm numbness    Micheal Ross. is a 60 y.o. male.  Patient with history of meningioma status postresection in 2014, cervical radiculopathy and disc disease --presents to the emergency department for evaluation of numbness and tingling in his left arm as well as neck pain.  Symptoms started approximately 1 PM yesterday.  Patient states that he was sitting on the couch and felt a pop in the side of his neck on the left side.  At the same time he developed numbness in his left arm.  He states that he had no sensation in the arm at that time.  He denies weakness or difference in strength.  Over time sensation has returned but is dull compared to the right arm.  Symptoms extend from the fingers on the left hand up to the shoulder.  He states that this morning he has more sensation in his ring and small fingers and less on the thumb, index, and middle fingers.  No change in strength.  Today he has pain more midline in the lower neck.  No new vision change or vision loss.  No weakness, numbness, or tingling in the legs and he is walking normally.  Pain is worse with palpation and movement of the neck.  Most recent MRI performed in January 2022.  No reported chest pain, abdominal pain, shortness of breath or vomiting. Patient denies signs of stroke including: facial droop, slurred speech, aphasia, imbalance/trouble walking.       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 Repeat in 4 months. path tubulovillous adenoma, negative for high-grade dysplasia   COLONOSCOPY WITH PROPOFOL N/A 01/20/2019   Procedure: COLONOSCOPY WITH PROPOFOL;  Surgeon: Danie Binder, MD; large defect (2 cm x 1.5 cm with small visible vessel with stigmata of recent bleed) in the cecum.  3 hemostatic clips placed (MR conditional).  One 3 mm polyp in the cecum, tortuous colon, external and internal hemorrhoids.  Pathology revealed tubular adenoma.   HERNIA REPAIR  0630   umbilical   NASAL SEPTOPLASTY W/ TURBINOPLASTY Bilateral 11/30/2020   Procedure: NASAL SEPTOPLASTY WITH BILATERAL  TURBINATE REDUCTION;  Surgeon: Leta Baptist, MD;  Location: [facility];  Service: ENT;  Laterality: Bilateral;   POLYPECTOMY  01/13/2019   Procedure: POLYPECTOMY;  Surgeon: Daneil Dolin, MD;  Location: [facility];  Service: Endoscopy;;  Large Cecal Polyp        Family History  Problem Relation Age of Onset   Thyroid disease Mother    COPD Father    Cancer Maternal Aunt    Cancer Maternal Uncle        back   Cancer Paternal Uncle        throat    Cancer Cousin        prostate   Colon cancer Neg Hx    Colon polyps Neg Hx     Social History   Tobacco Use   Smoking status: Former    Packs/day: 0.50    Years: 2.00    Pack years: 1.00     Types: Cigarettes    Quit date: 03/30/1983    Years since quitting: 37.7   Smokeless tobacco: Never  Vaping Use   Vaping Use: Never used  Substance Use Topics   Alcohol use: Yes    Comment: occ   Drug use: No    Home Medications Prior to Admission medications   Medication Sig Start Date End Date Taking? Authorizing Provider  acetaminophen (TYLENOL) 500 MG tablet Take 500 mg by mouth every 6 (six) hours as needed.    [provider]  amLODipine (NORVASC) 10 MG tablet Take 10 mg by mouth daily.    [provider]  lisinopril (ZESTRIL) 10 MG tablet Take 10 mg by mouth daily.    [provider]  lovastatin (MEVACOR) 40 MG tablet Take 1 tablet (40 mg total) by mouth every morning. 04/01/19   Dettinger, Fransisca Kaufmann, MD  meclizine (ANTIVERT) 25 MG tablet Take 1 tablet (25 mg total) by mouth 3 (three) times daily as needed for dizziness. 09/06/20   Veryl Speak, MD  methocarbamol (ROBAXIN) 500 MG tablet Take 1 or 2 po Q 6hrs for muscle  pain 02/23/20   Rolland Porter, MD  Multiple Vitamins-Minerals (ONE-A-DAY MENS 50+ ADVANTAGE) TABS Take 1 tablet by mouth daily. Patient not taking: No sig reported    [provider]  predniSONE (DELTASONE) 20 MG tablet Take 3 po QD x 3d , then 2 po QD x 3d then 1 po QD x 3d 02/23/20   Rolland Porter, MD    Allergies    Patient has no known allergies.  Review of Systems   Review of Systems  Constitutional:  Negative for fever.  HENT:  Negative for rhinorrhea and sore throat.   Eyes:  Negative for redness and visual disturbance.  Respiratory:  Negative for cough.   Cardiovascular:  Negative for chest pain.  Gastrointestinal:  Negative for abdominal pain, diarrhea, nausea and vomiting.  Genitourinary:  Negative for dysuria and hematuria.  Musculoskeletal:  Positive for myalgias and neck pain. Negative for neck stiffness.  Skin:  Negative for rash.  Neurological:  Positive for numbness. Negative for weakness and headaches.    Physical Exam Updated Vital Signs BP 116/81 (BP Location: Right Arm)   Pulse 84   Temp 98.8 F (37.1 C) (Oral)   Resp 20   SpO2 98%   Physical Exam Vitals and nursing note reviewed.  Constitutional:      General: He is not in acute distress.    Appearance: He is well-developed.  HENT:     Head: Normocephalic and atraumatic.     Right Ear: Tympanic membrane,  ear canal and external ear normal.     Left Ear: Tympanic membrane, ear canal and external ear normal.     Nose: Nose normal.     Mouth/Throat:     Pharynx: Uvula midline.  Eyes:     General: Lids are normal.        Right eye: No discharge.        Left eye: No discharge.     Conjunctiva/sclera: Conjunctivae normal.     Pupils: Pupils are equal, round, and reactive to light.  Cardiovascular:     Rate and Rhythm: Normal rate and regular rhythm.     Heart sounds: Normal heart sounds.  Pulmonary:     Effort: Pulmonary effort is normal.     Breath sounds: Normal breath sounds.  Abdominal:     Palpations: Abdomen is soft.     Tenderness: There is no abdominal tenderness.  Musculoskeletal:        General: Normal range of motion.     Cervical back: Normal range of motion and neck supple. No tenderness or bony tenderness.  Skin:    General: Skin is warm and dry.  Neurological:     Mental Status: He is alert and oriented to person, place, and time.     GCS: GCS eye subscore is 4. GCS verbal subscore is 5. GCS motor subscore is 6.     Cranial Nerves: Cranial nerves 2-12 are intact. No cranial nerve deficit.     Sensory: Sensory deficit present.     Motor: No abnormal muscle tone.     Coordination: Coordination normal.     Gait: Gait normal.     Comments: Upper extremity myotomes tested bilaterally:  C5 Shoulder abduction 5/5 C6 Elbow flexion/wrist extension 5/5 C7 Elbow extension 5/5 C8 Finger flexion 5/5 T1 Finger abduction 5/5      ED Results / Procedures / Treatments   Labs (all labs ordered are listed, but  only abnormal results are displayed) Labs Reviewed  COMPREHENSIVE METABOLIC PANEL - Abnormal; Notable for the following components:      Result Value   Glucose, Bld 102 (*)    All other components within normal limits  PROTIME-INR  APTT  CBC  DIFFERENTIAL  I-STAT CHEM 8, ED    EKG EKG Interpretation  Date/Time:  Saturday January 01 2021 08:46:42 EDT Ventricular Rate:  82 PR Interval:  178 QRS Duration: 86 QT Interval:  374 QTC Calculation: 436 R Axis:   -26 Text Interpretation: Normal sinus rhythm Nonspecific T wave abnormality Abnormal ECG Confirmed by Dene Gentry [phone]) on 01/01/2021 11:22:07 AM  Radiology DG Chest 2 View  Result Date: 01/01/2021 CLINICAL DATA:  left arm numbness / weakness EXAM: CHEST - 2 VIEW COMPARISON:  None. FINDINGS: The cardiomediastinal silhouette is within normal limits. No pleural effusion. No pneumothorax. No mass or consolidation. No acute osseous abnormality. IMPRESSION: No acute findings in the chest. Electronically Signed   By: Albin Felling M.D.   On: 01/01/2021 10:06    Procedures Procedures   Medications Ordered in ED Medications  sodium chloride flush (NS) 0.9 % injection 3 mL (has no administration in time range)    ED Course  I have reviewed the triage vital signs and the nursing notes.  Pertinent labs & imaging results that were available during my care of the patient were reviewed by me and considered in my medical decision making (see chart for details).  Patient seen and examined. Work-up initiated.  Reviewed MRI from earlier  this year.  I have ordered MRI brain and cervical spine to rule out stroke and evaluate for progression of disc disease in the neck.  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  Vital signs reviewed and are as follows: BP 116/81 (BP Location: Right Arm)   Pulse 84   Temp 98.8 F (37.1 C) (Oral)   Resp 20   SpO2 98%   1:48 PM Patient continues to wait  on imaging.   3:09 PM Signout to [employer] at shift change. Pending MRI results.   Will likely need treatment for radiculopathy depending on results of MRI.   3:20 PM Pt in MRI.     MDM Rules/Calculators/A&P                           Pending completion of work-up.    Final Clinical Impression(s) / ED Diagnoses Final diagnoses:  Paresthesia of left upper extremity  Neck pain    Rx / DC Orders ED Discharge Orders     None        Carlisle Cater, PA-C 01/01/21 1520    Valarie Merino, MD 01/02/21 [phone]

## 2021-01-01 NOTE — ED Triage Notes (Signed)
Pt felt a sudden pop in L posterior neck at 1pm yesterday followed by numbness and weakness to L arm.  Reports L sided posterior headache.  No arm drift.  History of brain tumor. VAN negative.

## 2021-01-01 NOTE — ED Provider Notes (Signed)
Emergency Medicine Provider Triage Evaluation Note  Micheal Ross. , a 60 y.o. male  was evaluated in triage.  Pt complains of feeling a pop in the left side of his neck at 1pm yesterday, and since having pain in the left shoulder and numbness extending from his left face all the way down his left arm. Patient denies chest pain, SOB, diaphoresis, nausea, vomiting. He was sitting and not doing anything when this all began. He has a hx of meningioma removed surgically last year, recent septoplasty surgery, and hx of some cervical radiculopathy and shoulder arthritis.  Review of Systems  Positive: As above Negative: As above  Physical Exam  BP 131/83 (BP Location: Right Arm)   Pulse 98   Temp 98.8 F (37.1 C) (Oral)   Resp 14   SpO2 100%  Gen:   Awake, no distress   Resp:  Normal effort  MSK:   Moves extremities without difficulty, decreased strength of left arm compared to right, no color change or swelling of shoulder joint, TTP left shoulder Other:  CN3-12 grossly intact other than subjective difference in sensation of left side of face compared to right -- no facial droop, dysarthria  Medical Decision Making  Medically screening exam initiated at 8:45 AM.  Appropriate orders placed.  Micheal Ross. was informed that the remainder of the evaluation will be completed by another provider, this initial triage assessment does not replace that evaluation, and the importance of remaining in the ED until their evaluation is complete.  Spoke with Dr. Matilde Sprang about patient Cervical compression vs. TOS, hx of meningioma   Micheal Ross 01/01/21 0848    [name], MD 01/01/21 [phone]

## 2021-02-14 ENCOUNTER — Other Ambulatory Visit: Payer: Self-pay | Admitting: Neurology

## 2021-02-14 DIAGNOSIS — M5412 Radiculopathy, cervical region: Secondary | ICD-10-CM

## 2021-03-01 ENCOUNTER — Other Ambulatory Visit: Payer: Self-pay

## 2021-03-01 ENCOUNTER — Ambulatory Visit
Admission: RE | Admit: 2021-03-01 | Discharge: 2021-03-01 | Disposition: A | Discharge status: Home / Self Care | Payer: 59 | Source: Ambulatory Visit | Attending: Neurology | Admitting: Neurology

## 2021-03-01 DIAGNOSIS — M5412 Radiculopathy, cervical region: Secondary | ICD-10-CM

## 2021-03-01 IMAGING — XA DG INJECT/[PERSON_NAME] INC NEEDLE/CATH/PLC EPI/CERV/THOR W/IMG
2 series · 2 of 2 positions shown · non-contrast
Comparison: none

CLINICAL DATA: Multilevel cervical disc disease. Displacement
cervical discs at each level from C4-5 through T1-2. Pain and
numbness into the left upper extremity. Radiculopathy, cervical.

[Series 1: ortho adipose · 1 of 1 slices shown (1 of 2)]
[im 1/1]
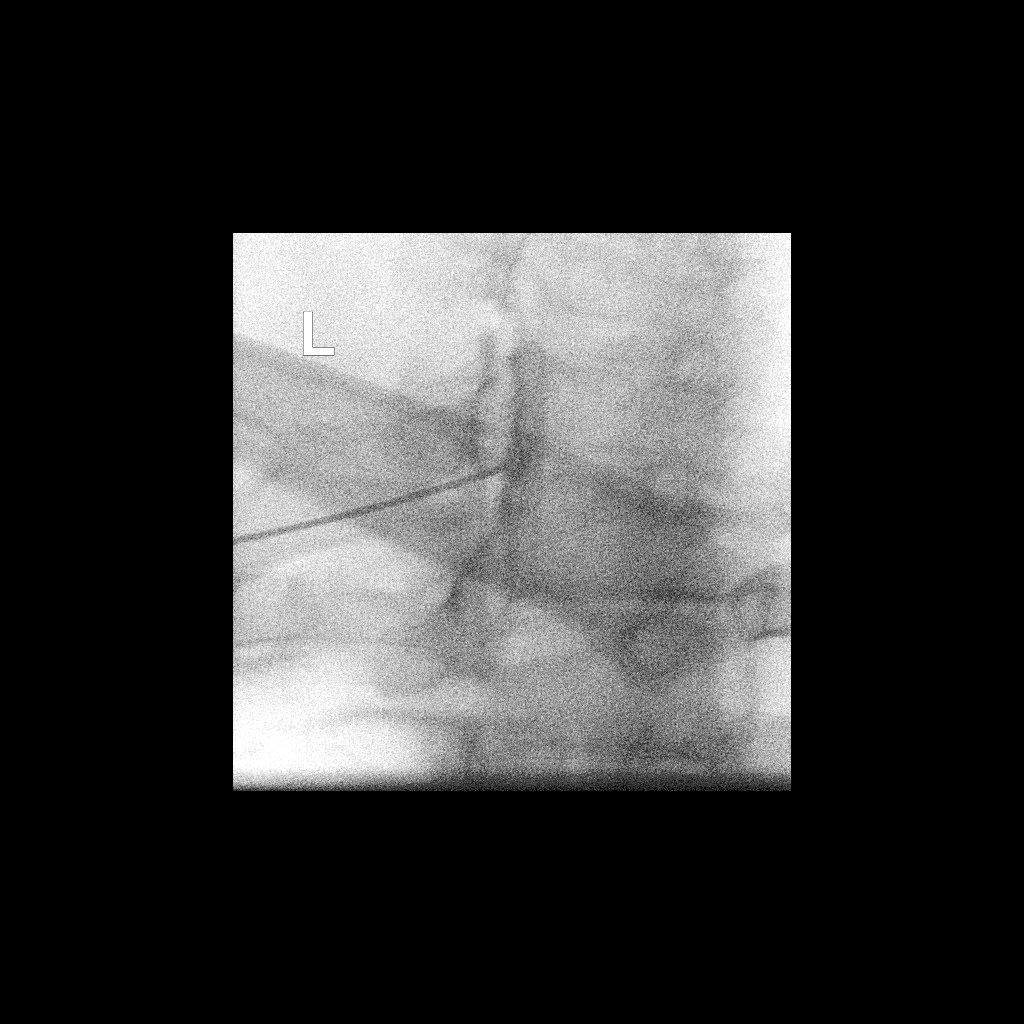

[Series 2: ortho adipose · 1 of 1 slices shown (2 of 2)]
[im 1/1]
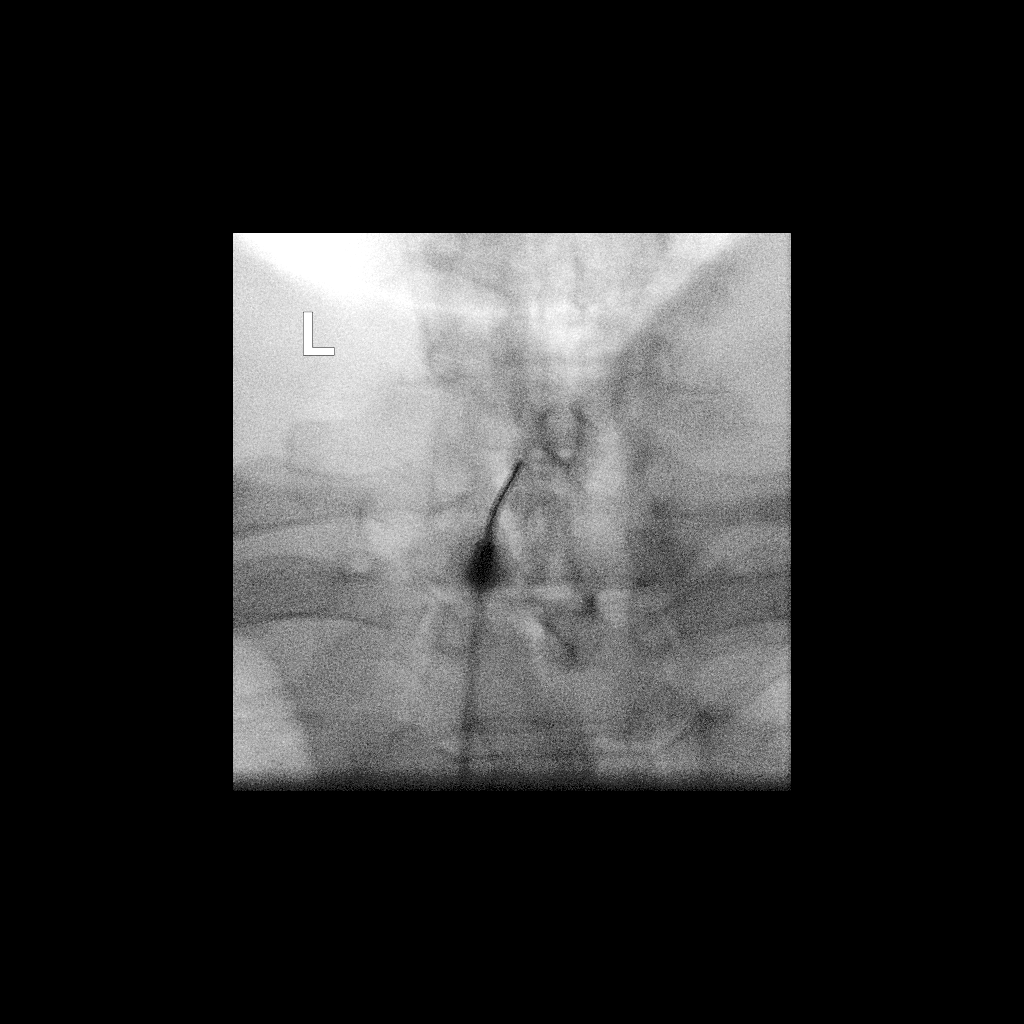

[2 of 2 positions shown; findings below may reference images not displayed]

FLUOROSCOPY TIME:  Radiation Exposure Index (as provided by the
fluoroscopic device): Dose area product 24.49 uGy*m2

PROCEDURE:
CERVICAL EPIDURAL INJECTION

An interlaminar approach was performed on the left at C7-T1. A 20
gauge epidural needle was advanced using loss-of-resistance
technique.

DIAGNOSTIC EPIDURAL INJECTION

Injection of Isovue-M 300 shows a good epidural pattern with spread
above and below the level of needle placement, primarily on the
left. No vascular opacification is seen. THERAPEUTIC

EPIDURAL INJECTION

1.5 ml of Kenalog 40 mixed with 1 ml of 1% Lidocaine and 2 ml of
normal saline were then instilled. The procedure was well-tolerated,
and the patient was discharged thirty minutes following the
injection in good condition.
IMPRESSION: Technically successful second epidural injection on the left at
C7-T1.

## 2021-03-01 MED ORDER — TRIAMCINOLONE ACETONIDE 40 MG/ML IJ SUSP (RADIOLOGY)
60.0000 mg | Freq: Once | INTRAMUSCULAR | Status: AC
  Administered 2021-03-01: 10:00:00 60 mg via EPIDURAL

## 2021-03-01 MED ORDER — IOPAMIDOL (ISOVUE-M 300) INJECTION 61%
1.0000 mL | Freq: Once | INTRAMUSCULAR | Status: AC
  Administered 2021-03-01: 10:00:00 1 mL via EPIDURAL

## 2021-03-01 NOTE — Discharge Instructions (Signed)

## 2021-03-22 ENCOUNTER — Telehealth: Payer: Self-pay

## 2021-03-22 NOTE — Telephone Encounter (Signed)
It looks like he originally was recommended to have 4 mo repeat in 2020 but ended up having another TCS a few days later.  Didn't see a repeat recommendation under last TCS note.    Roseanne Kaufman, NP:  Can you assist me and see what recommendations you think are appropriate?  Pt is aware that I am consulting with our providers and will be back in touch with him.

## 2021-03-22 NOTE — Telephone Encounter (Signed)
Pt called office to see if he is due for tcs. Per recalls he is past due for tcs. Does he need triage or OV?

## 2021-03-23 NOTE — Telephone Encounter (Signed)
Initial polyp 01/13/19 was large (20 mm) tubulovillous adenoma. Piecemeal resection. Post-polypectomy bleeding thereafter with colonoscopy 01/20/2019 showed the large defect with small visible vessel and 3 clips placed along with hemospray.   Interesting situation. As he had piecemeal resection, 6 month surveillance had been recommended. He also had path with tubulovillous adenoma, which would put him at 3 year surveillance regardless.   To err on side of caution, recommend surveillance now.

## 2021-03-25 NOTE — Telephone Encounter (Signed)
Scheduled nurse visit for 04/14/2021 by telephone at 2:00.  Pt would like to know if the procedure could be with Dr. Abbey Chatters instead of Dr. Gala Romney.  Said his procedure did not go too well with Dr. Gala Romney in his opinion.

## 2021-04-06 ENCOUNTER — Other Ambulatory Visit: Payer: Self-pay

## 2021-04-06 ENCOUNTER — Emergency Department (HOSPITAL_COMMUNITY): Payer: 59

## 2021-04-06 ENCOUNTER — Encounter (HOSPITAL_COMMUNITY): Payer: Self-pay

## 2021-04-06 ENCOUNTER — Emergency Department (HOSPITAL_COMMUNITY)
Admission: EM | Admit: 2021-04-06 | Discharge: 2021-04-06 | Disposition: A | Discharge status: Home / Self Care | Payer: 59 | Attending: Emergency Medicine | Admitting: Emergency Medicine

## 2021-04-06 DIAGNOSIS — M47812 Spondylosis without myelopathy or radiculopathy, cervical region: Secondary | ICD-10-CM

## 2021-04-06 DIAGNOSIS — Z87891 Personal history of nicotine dependence: Secondary | ICD-10-CM | POA: Diagnosis not present

## 2021-04-06 DIAGNOSIS — I10 Essential (primary) hypertension: Secondary | ICD-10-CM | POA: Diagnosis not present

## 2021-04-06 DIAGNOSIS — Z7982 Long term (current) use of aspirin: Secondary | ICD-10-CM | POA: Diagnosis not present

## 2021-04-06 DIAGNOSIS — R55 Syncope and collapse: Secondary | ICD-10-CM | POA: Diagnosis present

## 2021-04-06 DIAGNOSIS — M542 Cervicalgia: Secondary | ICD-10-CM

## 2021-04-06 DIAGNOSIS — Z79899 Other long term (current) drug therapy: Secondary | ICD-10-CM | POA: Insufficient documentation

## 2021-04-06 LAB — CBC
HCT: 40.4 % (ref 39.0–52.0)
Hemoglobin: 14.6 g/dL (ref 13.0–17.0)
MCH: 32.9 pg (ref 26.0–34.0)
MCHC: 36.1 g/dL — ABNORMAL HIGH (ref 30.0–36.0)
MCV: 91 fL (ref 80.0–100.0)
Platelets: 225 10*3/uL (ref 150–400)
RBC: 4.44 MIL/uL (ref 4.22–5.81)
RDW: 12.6 % (ref 11.5–15.5)
WBC: 5.8 10*3/uL (ref 4.0–10.5)
nRBC: 0 % (ref 0.0–0.2)

## 2021-04-06 LAB — URINALYSIS, ROUTINE W REFLEX MICROSCOPIC
Bilirubin Urine: NEGATIVE
Glucose, UA: NEGATIVE mg/dL
Hgb urine dipstick: NEGATIVE
Ketones, ur: 15 mg/dL — AB
Leukocytes,Ua: NEGATIVE
Nitrite: NEGATIVE
Protein, ur: NEGATIVE mg/dL
Specific Gravity, Urine: <1.005 — ABNORMAL LOW (ref 1.005–1.030)
pH: 6.5 (ref 5.0–8.0)

## 2021-04-06 LAB — BASIC METABOLIC PANEL
Anion gap: 9 (ref 5–15)
BUN: 11 mg/dL (ref 8–23)
CO2: 24 mmol/L (ref 22–32)
Calcium: 9.3 mg/dL (ref 8.9–10.3)
Chloride: 105 mmol/L (ref 98–111)
Creatinine, Ser: 0.94 mg/dL (ref 0.61–1.24)
GFR, Estimated: >60 mL/min (ref >60)
Glucose, Bld: 101 mg/dL — ABNORMAL HIGH (ref 70–99)
Potassium: 4 mmol/L (ref 3.5–5.1)
Sodium: 138 mmol/L (ref 135–145)

## 2021-04-06 LAB — CBG MONITORING, ED: Glucose-Capillary: 101 mg/dL — ABNORMAL HIGH (ref 70–99)

## 2021-04-06 IMAGING — DX DG CHEST 1V PORT
1 series · 1 of 1 positions shown · non-contrast
Comparison: [DATE]

CLINICAL DATA: Weakness and syncopal episode today. Hx HTN, former
smoker

EXAM:
PORTABLE CHEST - 1 VIEW

[chest ap]
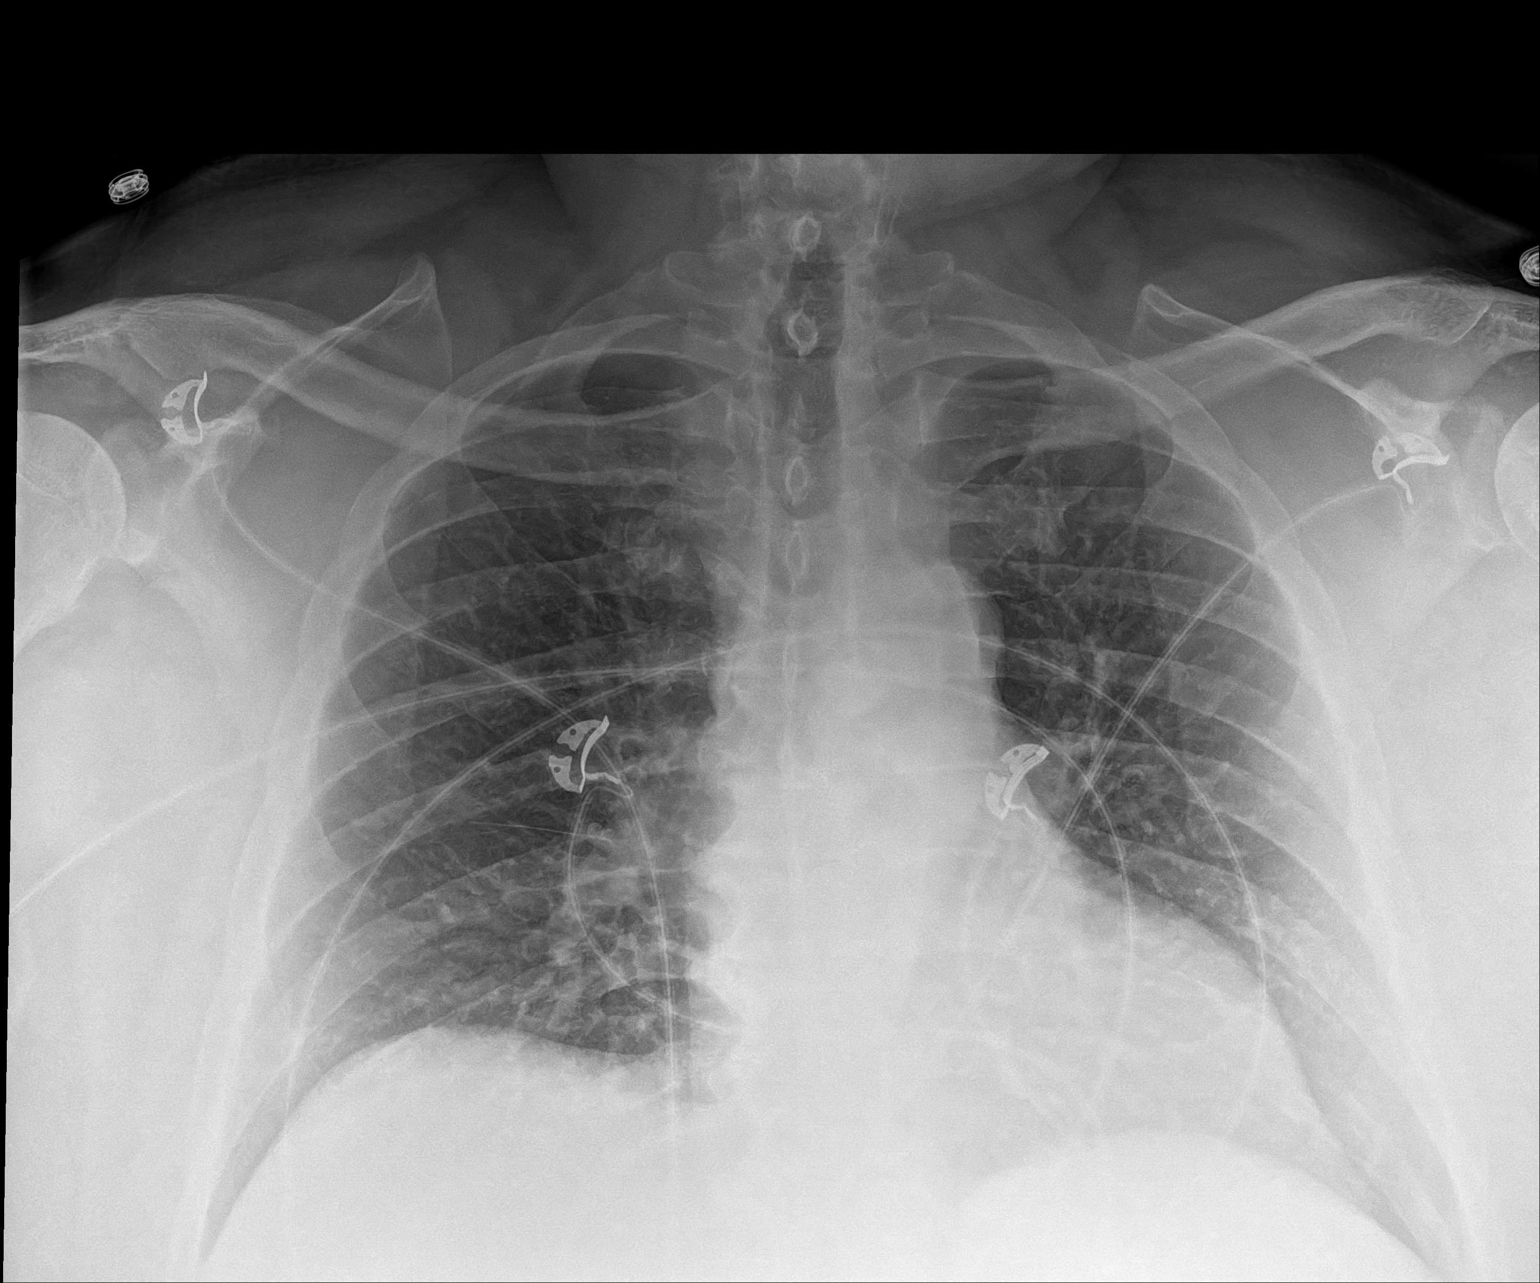

[1 of 1 positions shown; findings below may reference images not displayed]

FINDINGS: Lungs are clear.

Heart size and mediastinal contours are within normal limits.

No effusion.

Vertebral endplate spurring at multiple levels in the mid and lower
thoracic spine.
IMPRESSION: No acute cardiopulmonary disease.

## 2021-04-06 NOTE — Discharge Instructions (Signed)
The testing today did not show any cardiac, pulmonary or blood problems.  Follow-up with your orthopedist, tomorrow as scheduled.

## 2021-04-06 NOTE — ED Triage Notes (Signed)
Pt presents to ED via [facility] for LOC for approx 10 seconds last night. Pt states right before he passed out he became light headed. Pt states he is having pain on the left sided neck pain and abdominal pain. Pt states he has a history of numbness in neck from bones approx 6 months ago.

## 2021-04-06 NOTE — ED Provider Notes (Signed)
He presents for evaluation [facility] Provider Note   CSN: [id number] Arrival date & time: 04/06/21  1405     History  Chief Complaint  Patient presents with   Loss of Consciousness    Micheal Going. is a 61 y.o. male.  HPI He is here for evaluation of a short period of syncope which occurred last night while he was home.  He decided come in today evaluation.  He was EMS.  He recalls feeling lightheaded prior to having syncope.  He was having pain in his neck and abdomen when he passed out last night . No prior episodes of syncope.  He has been having left-sided neck pain, sees neurology, had an MRI, and has been referred to a spine orthopedist, Dr. Lorin Mercy who he plans on seeing tomorrow.  This is to evaluate cervical spine disorder.    t Home Medications Prior to Admission medications   Medication Sig Start Date End Date Taking? Authorizing Provider  acetaminophen (TYLENOL) 500 MG tablet Take 500 mg by mouth every 6 (six) hours as needed.   Yes [provider]  amLODipine (NORVASC) 10 MG tablet Take 10 mg by mouth daily.   Yes [provider]  aspirin EC 81 MG tablet Take 81 mg by mouth See admin instructions. Swallow whole. Take 1 tablet twice a week   Yes [provider]  gabapentin (NEURONTIN) 300 MG capsule Take 300 mg by mouth at bedtime.   Yes [provider]  lisinopril (ZESTRIL) 10 MG tablet Take 10 mg by mouth daily.   Yes [provider]  lovastatin (MEVACOR) 40 MG tablet Take 1 tablet (40 mg total) by mouth every morning. 04/01/19  Yes Dettinger, Fransisca Kaufmann, MD  Multiple Vitamins-Minerals (ONE-A-DAY MENS 50+ ADVANTAGE) TABS Take 1 tablet by mouth daily.   Yes [provider]  ibuprofen (ADVIL) 600 MG tablet Take 600 mg by mouth 2 (two) times daily as needed. 03/09/21   [provider]  meclizine (ANTIVERT) 25 MG tablet Take 1 tablet (25 mg total) by mouth 3 (three) times daily as needed for  dizziness. Patient not taking: Reported on 04/06/2021 09/06/20   Veryl Speak, MD  methocarbamol (ROBAXIN) 500 MG tablet Take 1 tablet (500 mg total) by mouth 2 (two) times daily. Patient not taking: Reported on 04/06/2021 01/01/21   Jacqlyn Larsen, PA-C  methylPREDNISolone (MEDROL DOSEPAK) 4 MG TBPK tablet Take as directed 01/01/21   Jacqlyn Larsen, PA-C  predniSONE (DELTASONE) 20 MG tablet Take 3 po QD x 3d , then 2 po QD x 3d then 1 po QD x 3d Patient not taking: Reported on 04/06/2021 02/23/20   Rolland Porter, MD      Allergies    Pollen extract    Review of Systems   Review of Systems  Physical Exam Updated Vital Signs BP 114/78 (BP Location: Left Arm)    Pulse 81    Temp 98.1 F (36.7 C) (Oral)    Resp 20    Ht 5\' 10"  (1.778 m)    Wt 129.3 kg    SpO2 99%    BMI 40.89 kg/m  Physical Exam Vitals and nursing note reviewed.  Constitutional:      General: He is not in acute distress.    Appearance: He is well-developed. He is obese. He is not ill-appearing or diaphoretic.  HENT:     Head: Normocephalic and atraumatic.     Right Ear: External ear normal.  Left Ear: External ear normal.  Eyes:     Conjunctiva/sclera: Conjunctivae normal.     Pupils: Pupils are equal, round, and reactive to light.  Neck:     Trachea: Phonation normal.  Cardiovascular:     Rate and Rhythm: Normal rate and regular rhythm.     Heart sounds: Normal heart sounds.  Pulmonary:     Effort: Pulmonary effort is normal. No respiratory distress.     Breath sounds: Normal breath sounds.  Chest:     Chest wall: No tenderness.  Abdominal:     Palpations: Abdomen is soft.     Tenderness: There is no abdominal tenderness.  Musculoskeletal:        General: Normal range of motion.     Cervical back: Normal range of motion and neck supple.     Comments: Mild tenderness left lateral cervical musculature, extending to the left trapezius.  Normal range of motion of the arms and legs without pain or deformity.   Skin:    General: Skin is warm and dry.  Neurological:     Mental Status: He is alert and oriented to person, place, and time.     Cranial Nerves: No cranial nerve deficit.     Sensory: No sensory deficit.     Motor: No abnormal muscle tone.     Coordination: Coordination normal.  Psychiatric:        Mood and Affect: Mood normal.        Behavior: Behavior normal.        Thought Content: Thought content normal.        Judgment: Judgment normal.    ED Results / Procedures / Treatments   Labs (all labs ordered are listed, but only abnormal results are displayed) Labs Reviewed  BASIC METABOLIC PANEL - Abnormal; Notable for the following components:      Result Value   Glucose, Bld 101 (*)    All other components within normal limits  CBC - Abnormal; Notable for the following components:   MCHC 36.1 (*)    All other components within normal limits  URINALYSIS, ROUTINE W REFLEX MICROSCOPIC - Abnormal; Notable for the following components:   Specific Gravity, Urine <1.005 (*)    Ketones, ur 15 (*)    All other components within normal limits  CBG MONITORING, ED - Abnormal; Notable for the following components:   Glucose-Capillary 101 (*)    All other components within normal limits    EKG EKG Interpretation  Date/Time:  Wednesday April 06 2021 14:37:37 EST Ventricular Rate:  93 PR Interval:  166 QRS Duration: 86 QT Interval:  364 QTC Calculation: 452 R Axis:   -24 Text Interpretation: Normal sinus rhythm Cannot rule out Anterior infarct , age undetermined Abnormal ECG When compared with ECG of 01-Jan-2021 08:46, No significant change was found Confirmed by Daleen Bo [phone]) on 04/06/2021 3:59:32 PM  Radiology DG Chest Port 1 View  Result Date: 04/06/2021 CLINICAL DATA:  Weakness and syncopal episode today. Hx HTN, former smoker EXAM: PORTABLE CHEST - 1 VIEW COMPARISON:  01/01/2021 FINDINGS: Lungs are clear. Heart size and mediastinal contours are within normal  limits. No effusion. Vertebral endplate spurring at multiple levels in the mid and lower thoracic spine. IMPRESSION: No acute cardiopulmonary disease. Electronically Signed   By: Lucrezia Europe M.D.   On: 04/06/2021 16:23    Procedures Procedures    Medications Ordered in ED Medications - No data to display  ED Course/ Medical Decision Making/ A&P  Clinical Course as of 04/07/21 1244  Wed Apr 06, 2021  1822 Patient is calm comfortable.  Vital signs normal and reassuring.  Fine discussed with the patient and all questions were answered.  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  Note that he recently had an epidural injection and has persistent pain.  No indications for hospitalization at this time. [EW]    Clinical Course User Index [EW] Daleen Bo, MD                           Medical Decision Making He presents for evaluation of a syncopal episode that occurred while he was having significant neck pain.  Neck pain is ongoing is currently under treatment with recent epidural injection, as well as plans for follow-up with orthopedic spine surgeon for possible surgical management.  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    Details: My findings showing cervical joint disease, and recent epidural injection, by interventional radiologist. Labs: ordered.    Details: CBC, metabolic panel, urinalysis-reassuring findings. Radiology: ordered and independent interpretation performed.    Details: Chest x-ray-no infiltrate or edema ECG/medicine tests: ordered and independent interpretation performed.     Details: Cardiac monitor with normal sinus rhythm.  EKG interpreted by me.  Risk Decision regarding hospitalization. Risk Details: Patient with syncope due to pain, without findings for cardio or pulmonary instability.  Thought disorder contributing to syncope.  Patient has short-term follow-up planned with orthopedics for management of neck pain.  No evidence for acute spinal myelopathy requiring intervention.  Patient does not require hospitalization and can be managed as an outpatient.           Final Clinical Impression(s) / ED Diagnoses Final diagnoses:  Syncope, unspecified syncope type    Rx / DC Orders ED Discharge Orders     None         Daleen Bo, MD 04/07/21 1247

## 2021-04-07 ENCOUNTER — Encounter: Payer: Self-pay | Admitting: Orthopaedic Surgery

## 2021-04-07 ENCOUNTER — Ambulatory Visit (INDEPENDENT_AMBULATORY_CARE_PROVIDER_SITE_OTHER): Payer: 59 | Admitting: Orthopaedic Surgery

## 2021-04-07 VITALS — Ht 70.0 in | Wt 287.0 lb

## 2021-04-07 DIAGNOSIS — R29898 Other symptoms and signs involving the musculoskeletal system: Secondary | ICD-10-CM | POA: Diagnosis not present

## 2021-04-07 DIAGNOSIS — M79602 Pain in left arm: Secondary | ICD-10-CM | POA: Diagnosis not present

## 2021-04-07 NOTE — Progress Notes (Signed)
Office Visit Note   Patient: Micheal Ross.           Date of Birth: 08-15-1960           MRN: [id number] Visit Date: 04/07/2021              Requested by: [name], MD [place],  [name] [id number] PCP: [name], MD   Assessment & Plan: Visit Diagnoses:  1. Left arm pain   2. Left arm weakness     Plan: MRI scan is reviewed he does have some foraminal narrowing at C3-4 and see 4-5 and 5 -6.  Mild central narrowing at 4 -5 and 5- 6.  With his whole hand numbness would recommend proceeding with EMGs nerve conduction velocities.  Some of his records list diagnosis of carpal tunnel syndrome but I am unable to find any old nerve conduction velocity test.  He certainly does have some foraminal narrowing but it is only in the moderate range and his pain is rated as severe.  Office follow-up after scan for review.  Follow-Up Instructions: No follow-ups on file.   Orders:  Orders Placed This Encounter  Procedures   Ambulatory referral to Neurology   No orders of the defined types were placed in this encounter.     Procedures: No procedures performed   Clinical Data: No additional findings.   Subjective: Chief Complaint  Patient presents with   Neck - Pain    HPI 61 year old male referred by Dr.Shah with neck pain.  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  He has been on ibuprofen and gabapentin which has not helped he states his pain is severe.  He has been going back and forth to my hospital emergency room his had MRI scan cervical spine 01/01/2021 and also 03/12/2020.  Past history of meningioma resection left frontal with some encephalomalacia adjacent to the tumor area.  Patient's only his left arm across his chest flexed position states is painful radiating to his hand with weakness and numbness pain in his neck and feels like his neck and shoulder are swollen.  Patient states at times pain has been so  bad that he has fainted due to the pain.  Last trip to ED was yesterday.  Previous injections October and November without relief in [facility].  Patient states he has a disc protrusion in his neck and needs surgery.  Review of Systems positive for meningioma.  History of sleep apnea hypertension hyperlipidemia.  All the systems are noncontributory HPI.   Objective: Vital Signs: Ht 5\' 10"  (1.778 m)    Wt 287 lb (130.2 kg)    BMI 41.18 kg/m   Physical Exam Constitutional:      Appearance: He is well-developed.  HENT:     Head: Normocephalic and atraumatic.     Right Ear: External ear normal.     Left Ear: External ear normal.  Eyes:     Pupils: Pupils are equal, round, and reactive to light.  Neck:     Thyroid: No thyromegaly.     Trachea: No tracheal deviation.  Cardiovascular:     Rate and Rhythm: Normal rate.  Pulmonary:     Effort: Pulmonary effort is normal.     Breath sounds: No wheezing.  Abdominal:     General: Bowel sounds are normal.     Palpations: Abdomen is soft.  Musculoskeletal:  Cervical back: Neck supple.  Skin:    General: Skin is warm and dry.     Capillary Refill: Capillary refill takes less than 2 seconds.  Neurological:     Mental Status: He is alert and oriented to person, place, and time.  Psychiatric:        Behavior: Behavior normal.        Thought Content: Thought content normal.        Judgment: Judgment normal.    Ortho Exam patient has some difficulty ambulating balance is rated only fair.  Upper extremity reflexes are 1+ symmetrical he has severe brachial plexus tenderness on the left negative on the right he can rotate his neck right and left comfortably.  No increased pain with compression he complains of left side facial pain with distraction.  Negative Tinel's at the cubital tunnel slight thenar weakness with resisted testing on the left without atrophy.  Negative Phalen's test.  Specialty Comments:  No specialty comments  available.  Imaging: DG Chest Port 1 View  Result Date: 04/06/2021 CLINICAL DATA:  Weakness and syncopal episode today. Hx HTN, former smoker EXAM: PORTABLE CHEST - 1 VIEW COMPARISON:  01/01/2021 FINDINGS: Lungs are clear. Heart size and mediastinal contours are within normal limits. No effusion. Vertebral endplate spurring at multiple levels in the mid and lower thoracic spine. IMPRESSION: No acute cardiopulmonary disease. Electronically Signed   By: Lucrezia Europe M.D.   On: 04/06/2021 16:23    CLINICAL DATA:  Myelopathy, acute or progressive. Cervical radiculopathy. Numbness or tingling.   EXAM: MRI CERVICAL SPINE WITHOUT CONTRAST   TECHNIQUE: Multiplanar, multisequence MR imaging of the cervical spine was performed. No intravenous contrast was administered.   COMPARISON:  MRI of the cervical spine 03/12/2020   FINDINGS: Alignment: No significant listhesis is present. Straightening and some reversal of the normal cervical lordosis is noted.   Vertebrae: Chronic endplate changes are stable. Vertebral body heights maintained. Focal T2 hyperintense lesion at C3 is stable.   Cord: Normal signal and morphology.   Posterior Fossa, vertebral arteries, paraspinal tissues: Craniocervical junction is normal. Flow is present in the vertebral arteries bilaterally. Visualized intracranial contents are normal.   Disc levels:   C2-3: A shallow central soft disc protrusion is present. Moderate left-sided facet spurring contributes to mild left foraminal narrowing.   C3-4: A broad-based disc osteophyte complex partially effaces the ventral CSF. Uncovertebral and facet spurring result in moderate right and mild left foraminal narrowing.   C4-5: A broad-based disc osteophyte complex partially effaces the ventral CSF. Uncovertebral and facet hypertrophy result in moderate foraminal stenosis bilaterally, left greater than right.   C5-6: A broad-based disc osteophyte complex is worse on the  right. Moderate foraminal narrowing is worse on the right. Partial effacement of the ventral CSF noted.   C6-7: A rightward disc osteophyte complex partially effaces the ventral CSF. Mild right foraminal narrowing is stable.   C7-T1: Asymmetric left-sided uncovertebral disease is present. Moderate foraminal narrowing is worse left than right.   IMPRESSION: 1. 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 2. Mild left foraminal narrowing at C2-3. 3. Moderate right and mild left foraminal narrowing at C3-4. 4. Moderate foraminal narrowing bilaterally at C4-5 and C5-6 is worse on the right. 5. Mild right foraminal narrowing at C6-7. 6. Moderate foraminal narrowing bilaterally at C7-T1 is worse on the left.     Electronically Signed   By: San Morelle M.D.   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   Rectal bleeding 01/15/2019   Gastroesophageal reflux disease 01/15/2019   Shortness of breath 01/15/2019   History of colonic polyps 11/14/2018   Hypertension 03/29/2018   Hyperlipidemia 03/29/2018   Meningioma ([facility]) 09/20/2012   Past Medical History:  Diagnosis Date   Brain tumor ([facility])    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)   Hypercholesterolemia    Hypertension    Neck pain    Shoulder pain    Sleep apnea    set to get tested soon 01/20/19    Family History  Problem Relation Age of Onset   Thyroid disease Mother    COPD Father    Cancer Maternal Aunt    Cancer Maternal Uncle        back   Cancer Paternal Uncle        throat    Cancer Cousin        prostate   Colon cancer Neg Hx    Colon polyps Neg Hx     Past Surgical History:  Procedure Laterality Date   ANKLE SURGERY      brain tumor removal  2014   COLONOSCOPY WITH PROPOFOL N/A 01/13/2019   Procedure: COLONOSCOPY WITH PROPOFOL;  Surgeon: Daneil Dolin, MD;  20 mm polyp found in the cecum that was removed via piecemeal hot snare polypectomy. Repeat in 4 months. path tubulovillous adenoma, negative for high-grade dysplasia   COLONOSCOPY WITH PROPOFOL N/A 01/20/2019   Procedure: COLONOSCOPY WITH PROPOFOL;  Surgeon: Danie Binder, MD; large defect (2 cm x 1.5 cm with small visible vessel with stigmata of recent bleed) in the cecum.  3 hemostatic clips placed (MR conditional).  One 3 mm polyp in the cecum, tortuous colon, external and internal hemorrhoids.  Pathology revealed tubular adenoma.   HERNIA REPAIR  [id number]   umbilical   NASAL SEPTOPLASTY W/ TURBINOPLASTY Bilateral 11/30/2020   Procedure: NASAL SEPTOPLASTY WITH BILATERAL  TURBINATE REDUCTION;  Surgeon: Leta Baptist, MD;  Location: [facility];  Service: ENT;  Laterality: Bilateral;   POLYPECTOMY  01/13/2019   Procedure: POLYPECTOMY;  Surgeon: Daneil Dolin, MD;  Location: [facility];  Service: Endoscopy;;  Large Cecal Polyp    Social History   Occupational History   Not on file  Tobacco Use   Smoking status: Former    Packs/day: 0.50    Years: 2.00    Pack years: 1.00    Types: Cigarettes    Quit date: 03/30/1983    Years since quitting: 38.0   Smokeless tobacco: Never  Vaping Use   Vaping Use: Never used  Substance and Sexual Activity   Alcohol use: Not Currently    Comment: occ   Drug use: No   Sexual activity: Yes    Comment: married for 1985, 6 grown kids

## 2021-04-12 ENCOUNTER — Ambulatory Visit: Payer: Self-pay | Admitting: Orthopaedic Surgery

## 2021-04-14 ENCOUNTER — Other Ambulatory Visit: Payer: Self-pay

## 2021-04-14 ENCOUNTER — Ambulatory Visit (INDEPENDENT_AMBULATORY_CARE_PROVIDER_SITE_OTHER): Payer: Self-pay | Admitting: *Deleted

## 2021-04-14 VITALS — Ht 70.0 in | Wt 291.0 lb

## 2021-04-14 DIAGNOSIS — Z86010 Personal history of colonic polyps: Secondary | ICD-10-CM

## 2021-04-14 NOTE — Progress Notes (Addendum)
Gastroenterology Pre-Procedure Review  Request Date: 04/14/2021 Requesting Physician: See note from Roseanne Kaufman, NP (03/22/2021), Last TCS done 01/13/2019 by Dr. Gala Romney, large (9mm) tubulovillous adenoma, piecemeal resection, post-polypectomy bleeding after TCS, Had TCS 01/20/2019 by Dr. Oneida Alar, large defect with small visible vessel and 3 clips placed along with hemospray  PATIENT REVIEW QUESTIONS: The patient responded to the following health history questions as indicated:    1. Diabetes Melitis: no 2. Joint replacements in the past 12 months: no 3. Major health problems in the past 3 months: no 4. Has an artificial valve or MVP: no 5. Has a defibrillator: no 6. Has been advised in past to take antibiotics in advance of a procedure like teeth cleaning: no 7. Family history of colon cancer: no  8. Alcohol Use: no 9. Illicit drug Use: no 10. History of sleep apnea: yes, BIPAP  11. History of coronary artery or other vascular stents placed within the last 12 months: no 12. History of any prior anesthesia complications: no 13. Body mass index is 41.75 kg/m.    MEDICATIONS & ALLERGIES:    Patient reports the following regarding taking any blood thinners:   Plavix? no Aspirin? Yes, 81 mg every 4 to 5 days Coumadin? no Brilinta? no Xarelto? no Eliquis?  no Pradaxa? no Savaysa? no Effient? no  Patient confirms/reports the following medications:  Current Outpatient Medications  Medication Sig Dispense Refill   acetaminophen (TYLENOL) 500 MG tablet Take 500 mg by mouth as needed.     amLODipine (NORVASC) 10 MG tablet Take 5 mg by mouth daily. Pt takes 5 mg daily.     aspirin EC 81 MG tablet Take 81 mg by mouth as needed. Swallow whole. Take 1 tablet twice a week     gabapentin (NEURONTIN) 300 MG capsule Take 300 mg by mouth 2 (two) times daily.     lisinopril (ZESTRIL) 10 MG tablet Take 10 mg by mouth daily.     lovastatin (MEVACOR) 40 MG tablet Take 1 tablet (40 mg total) by mouth  every morning. 90 tablet 3   Multiple Vitamins-Minerals (ONE-A-DAY MENS 50+ ADVANTAGE) TABS Take 1 tablet by mouth daily.     No current facility-administered medications for this visit.    Patient confirms/reports the following allergies:  Allergies  Allergen Reactions   Pollen Extract     No orders of the defined types were placed in this encounter.   AUTHORIZATION INFORMATION Primary Insurance: Friday,  ID #: [id number],  Group #: Individual OnEx FNP [place] Pre-Cert / Auth required: No, not required per Mliss Sax / Auth #: REF: Leanne Chang 04/20/2021 7:18 AM [place] Standard Time  SCHEDULE INFORMATION: Procedure has been scheduled as follows:  Date: 05/02/2021, Time: 12:00 Location: [facility] with Dr. Abbey Chatters  This Gastroenterology Pre-Precedure Review Form is being routed to the following provider(s): Roseanne Kaufman, NP

## 2021-04-19 ENCOUNTER — Encounter: Payer: Self-pay | Admitting: *Deleted

## 2021-04-19 MED ORDER — PEG 3350-KCL-NA BICARB-NACL 420 G PO SOLR: 4000.0000 mL | Freq: Once | ORAL | 0 refills | Status: AC

## 2021-04-19 NOTE — Addendum Note (Signed)
Addended by: Metro Kung on: 04/19/2021 02:29 PM   Modules accepted: Orders

## 2021-04-19 NOTE — Progress Notes (Signed)
Appropriate. ASA 3

## 2021-04-19 NOTE — Progress Notes (Signed)
Spoke with pt.  Scheduled procedure for 05/02/2021.  Pt made aware that he is scheduled for Pre-op appointment on 04/28/2021 at 2:45 at [facility].  He was made aware that he will be told his procedure time when he goes for Pre-op appointment.  Reviewed prep instructions with pt by phone.  Pt has my chart so he was made aware prep instructions and Pre-op letter can be found there.  Confirmed mailing address and mailed out hard copy as well.

## 2021-04-20 ENCOUNTER — Telehealth: Payer: Self-pay | Admitting: Internal Medicine

## 2021-04-20 ENCOUNTER — Telehealth: Payer: Self-pay | Admitting: *Deleted

## 2021-04-20 ENCOUNTER — Other Ambulatory Visit: Payer: Self-pay | Admitting: *Deleted

## 2021-04-20 NOTE — Telephone Encounter (Signed)
Sent staff message to [place] in Endo to make her aware.

## 2021-04-20 NOTE — Telephone Encounter (Signed)
Pt called in and informed me that his PCP wants him to be monitored for 14 days since he has been having some dizziness.  He said that he is scheduled to go in Friday morning at 10:00 for an appointment and get the heart monitor.  Discussed with Roseanne Kaufman, NP and she advised that pt hold off on procedure until cleared since questionable heart issues and monitoring.    Spoke with pt and informed him of this information.

## 2021-04-20 NOTE — Telephone Encounter (Signed)
Pt made aware to contact us once he has his results and has been cleared to proceed with colonoscopy.  Pt voiced understanding.

## 2021-04-20 NOTE — Telephone Encounter (Signed)
Agree. We need to ensure nothing is going on before an elective procedure.

## 2021-04-20 NOTE — Telephone Encounter (Signed)
Pt called to say he needed to cancel his procedure on 2/27 due to wearing a heart monitor.

## 2021-04-22 ENCOUNTER — Telehealth: Payer: Self-pay | Admitting: Radiology

## 2021-04-22 NOTE — Telephone Encounter (Signed)
FYI-message from [facility], DOB: 28-Jan-2061, had his EMG/NCV done today at Dr. Alessandra Grout office.  They are going to fax the results to yall.  He is ready to set up his surgery.  His number is [phone].

## 2021-04-25 NOTE — Telephone Encounter (Signed)
noted 

## 2021-04-28 ENCOUNTER — Encounter (HOSPITAL_COMMUNITY): Payer: 59

## 2021-04-28 ENCOUNTER — Ambulatory Visit (INDEPENDENT_AMBULATORY_CARE_PROVIDER_SITE_OTHER): Payer: 59 | Admitting: Orthopaedic Surgery

## 2021-04-28 ENCOUNTER — Encounter: Payer: Self-pay | Admitting: Orthopaedic Surgery

## 2021-04-28 ENCOUNTER — Other Ambulatory Visit: Payer: Self-pay

## 2021-04-28 VITALS — Ht 70.0 in | Wt 285.0 lb

## 2021-04-28 DIAGNOSIS — M47812 Spondylosis without myelopathy or radiculopathy, cervical region: Secondary | ICD-10-CM | POA: Diagnosis not present

## 2021-04-28 DIAGNOSIS — G5602 Carpal tunnel syndrome, left upper limb: Secondary | ICD-10-CM | POA: Diagnosis not present

## 2021-04-28 NOTE — Progress Notes (Signed)
Office Visit Note   Patient: Micheal Ross.           Date of Birth: Mar 19, 1960           MRN: [id number] Visit Date: 04/28/2021              Requested by: [name], MD [address] [place],  [place] [address] PCP: [name], MD   Assessment & Plan: Visit Diagnoses:  1. Cervical spondylosis   2. Carpal tunnel syndrome, left upper limb     Plan: Patient does have cervical spondylosis with multi level disc space narrowing and spurring but no severe areas of compression either centrally or foraminally.  We discussed possible double crush symptoms.  We will proceed with a left carpal tunnel release defer any cervical treatment.  I discussed with him that he may have had more compression in the past with some improvement with residual electrical findings.  We will set the carpal tunnel release up for outpatient surgery.  We discussed procedure risk surgery questions elicited and answered he understands request to proceed.  Follow-Up Instructions: No follow-ups on file.   Orders:  Orders Placed This Encounter  Procedures   Ambulatory referral to Physical Therapy   No orders of the defined types were placed in this encounter.     Procedures: No procedures performed   Clinical Data: No additional findings.   Subjective: Chief Complaint  Patient presents with   Neck - Pain, Follow-up    EMG/NCS review    HPI 61 year old male returns with multilevel cervical spondylosis post electrical test.  Patient is here with his wife he continues to complain of pain in his neck pain with rotation of his neck pain that radiates into his shoulders.  He has numbness in his hands and waking up at night he shakes his hand.  He has had EMGs nerve conduction velocities which shows moderate left carpal tunnel syndrome mild right carpal tunnel syndrome and bilateral cervical radiculopathy especially involving C8-T1 on the left side.  MRI scan 01/01/2021 showed multilevel foraminal narrowing  including C2-3, C3-4, C4-5, C5-6, C6-7 and also C7-T1 worse on the left than right at the C7-T1 level.  Levels from C2-C6 showed varying degrees of right versus left being more narrow.  None of them were severe.  Patient has driven a schoolbus in the past.  Has a past history of meningioma with resection.  Review of Systems positive for hypertension hyperlipidemia.  Negative for MI or stroke.   Objective: Vital Signs: Ht 5\' 10"  (1.778 m)    Wt 285 lb (129.3 kg)    BMI 40.89 kg/m   Physical Exam Constitutional:      Appearance: He is well-developed.  HENT:     Head: Normocephalic and atraumatic.     Right Ear: External ear normal.     Left Ear: External ear normal.  Eyes:     Pupils: Pupils are equal, round, and reactive to light.  Neck:     Thyroid: No thyromegaly.     Trachea: No tracheal deviation.  Cardiovascular:     Rate and Rhythm: Normal rate.  Pulmonary:     Effort: Pulmonary effort is normal.     Breath sounds: No wheezing.  Abdominal:     General: Bowel sounds are normal.     Palpations: Abdomen is soft.  Musculoskeletal:     Cervical back: Neck supple.  Skin:    General: Skin is warm and dry.     Capillary  Refill: Capillary refill takes less than 2 seconds.  Neurological:     Mental Status: He is alert and oriented to person, place, and time.  Psychiatric:        Behavior: Behavior normal.        Thought Content: Thought content normal.        Judgment: Judgment normal.    Ortho Exam patient has more brachial plexus tenderness on the left than right upper extremity reflexes are normal with no triceps weakness.  Positive Tinel's and Phalen's test on the left greater than right.  Decreased sensation median distribution left hand.  Mild interosseous weakness worse in the left than right.  Specialty Comments:  EMGs nerve conduction velocities listed under media performed by [facility] shows bilateral cervical radiculopathy especially involving C8-T1 level  on the left side.  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  Mild right median neuropathy at the wrist.  Ulnar nerve was normal.   04/21/2021  Imaging: Narrative & Impression  CLINICAL DATA:  Myelopathy, acute or progressive. Cervical radiculopathy. Numbness or tingling.   EXAM: MRI CERVICAL SPINE WITHOUT CONTRAST   TECHNIQUE: Multiplanar, multisequence MR imaging of the cervical spine was performed. No intravenous contrast was administered.   COMPARISON:  MRI of the cervical spine 03/12/2020   FINDINGS: Alignment: No significant listhesis is present. Straightening and some reversal of the normal cervical lordosis is noted.   Vertebrae: Chronic endplate changes are stable. Vertebral body heights maintained. Focal T2 hyperintense lesion at C3 is stable.   Cord: Normal signal and morphology.   Posterior Fossa, vertebral arteries, paraspinal tissues: Craniocervical junction is normal. Flow is present in the vertebral arteries bilaterally. Visualized intracranial contents are normal.   Disc levels:   C2-3: A shallow central soft disc protrusion is present. Moderate left-sided facet spurring contributes to mild left foraminal narrowing.   C3-4: A broad-based disc osteophyte complex partially effaces the ventral CSF. Uncovertebral and facet spurring result in moderate right and mild left foraminal narrowing.   C4-5: A broad-based disc osteophyte complex partially effaces the ventral CSF. Uncovertebral and facet hypertrophy result in moderate foraminal stenosis bilaterally, left greater than right.   C5-6: A broad-based disc osteophyte complex is worse on the right. Moderate foraminal narrowing is worse on the right. Partial effacement of the ventral CSF noted.   C6-7: A rightward disc osteophyte complex partially effaces the ventral CSF. Mild right foraminal narrowing is stable.   C7-T1: Asymmetric left-sided uncovertebral disease is present. Moderate foraminal  narrowing is worse left than right.   IMPRESSION: 1. 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 2. Mild left foraminal narrowing at C2-3. 3. Moderate right and mild left foraminal narrowing at C3-4. 4. Moderate foraminal narrowing bilaterally at C4-5 and C5-6 is worse on the right. 5. Mild right foraminal narrowing at C6-7. 6. Moderate foraminal narrowing bilaterally at C7-T1 is worse on the left.     Electronically Signed   By: San Morelle M.D.   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   Rectal bleeding 01/15/2019   Gastroesophageal reflux disease 01/15/2019   Shortness of breath 01/15/2019  History of colonic polyps 11/14/2018   Hypertension 03/29/2018   Hyperlipidemia 03/29/2018   Meningioma ([place]) 09/20/2012   Past Medical History:  Diagnosis Date   Brain tumor ([place])    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)   Hypercholesterolemia    Hypertension    Neck pain    Shoulder pain    Sleep apnea    set to get tested soon 01/20/19    Family History  Problem Relation Age of Onset   Thyroid disease Mother    COPD Father    Cancer Maternal Aunt    Cancer Maternal Uncle        back   Cancer Paternal Uncle        throat    Cancer Cousin        prostate   Colon cancer Neg Hx    Colon polyps Neg Hx     Past Surgical History:  Procedure Laterality Date   ANKLE SURGERY     brain tumor removal  2014   COLONOSCOPY WITH PROPOFOL N/A 01/13/2019   Procedure: COLONOSCOPY WITH PROPOFOL;  Surgeon: Daneil Dolin, MD;  20 mm polyp found in the cecum that was removed via piecemeal hot snare polypectomy. Repeat in 4 months.  path tubulovillous adenoma, negative for high-grade dysplasia   COLONOSCOPY WITH PROPOFOL N/A 01/20/2019   Procedure: COLONOSCOPY WITH PROPOFOL;  Surgeon: Danie Binder, MD; large defect (2 cm x 1.5 cm with small visible vessel with stigmata of recent bleed) in the cecum.  3 hemostatic clips placed (MR conditional).  One 3 mm polyp in the cecum, tortuous colon, external and internal hemorrhoids.  Pathology revealed tubular adenoma.   HERNIA REPAIR  1700   umbilical   NASAL SEPTOPLASTY W/ TURBINOPLASTY Bilateral 11/30/2020   Procedure: NASAL SEPTOPLASTY WITH BILATERAL  TURBINATE REDUCTION;  Surgeon: Leta Baptist, MD;  Location: [facility];  Service: ENT;  Laterality: Bilateral;   POLYPECTOMY  01/13/2019   Procedure: POLYPECTOMY;  Surgeon: Daneil Dolin, MD;  [facility];  Service: Endoscopy;;  Large Cecal Polyp    Social History   Occupational History   Not on file  Tobacco Use   Smoking status: Former    Packs/day: 0.50    Years: 2.00    Pack years: 1.00    Types: Cigarettes    Quit date: 03/30/1983    Years since quitting: 38.1   Smokeless tobacco: Never  Vaping Use   Vaping Use: Never used  Substance and Sexual Activity   Alcohol use: Not Currently    Comment: occ   Drug use: No   Sexual activity: Yes    Comment: married for 1985, 6 grown kids

## 2021-05-02 ENCOUNTER — Ambulatory Visit (HOSPITAL_COMMUNITY): Admit: 2021-05-02 | Payer: 59

## 2021-05-02 ENCOUNTER — Encounter (HOSPITAL_COMMUNITY): Payer: Self-pay

## 2021-05-02 SURGERY — COLONOSCOPY WITH PROPOFOL
Anesthesia: Monitor Anesthesia Care

## 2021-06-20 ENCOUNTER — Telehealth: Payer: Self-pay | Admitting: Internal Medicine

## 2021-06-20 ENCOUNTER — Ambulatory Visit: Payer: 59 | Admitting: Cardiology

## 2021-06-20 ENCOUNTER — Encounter: Payer: Self-pay | Admitting: Cardiology

## 2021-06-20 VITALS — Ht 70.0 in | Wt 293.0 lb

## 2021-06-20 DIAGNOSIS — R55 Syncope and collapse: Secondary | ICD-10-CM

## 2021-06-20 DIAGNOSIS — I471 Supraventricular tachycardia: Secondary | ICD-10-CM | POA: Diagnosis not present

## 2021-06-20 DIAGNOSIS — R42 Dizziness and giddiness: Secondary | ICD-10-CM

## 2021-06-20 NOTE — Progress Notes (Signed)
? ? ? ?Clinical Summary ?Micheal Ross is a 61 y.o.male seen as new consult, referred by Micheal Ross for the following medical problems. ? ?1.Syncope ?- seen in ER 04/06/21 ?- was having abdominal and neck pain at the time ?- EKG NSR, benign labs ?- 04/2021 echo pcp office: grossly normal LVEF, limited visualization. Grossly no major valve abnormalitiy ?- pcp 14 day zio patch: min HR 50, max HR 152, Avg HR 73. Rare supraventricular ectopy, three runs of SVT longest 8 beats. Frequent isolated PVCs (6.3%), no NSVT or VT.  ? ? ?- symptoms started Jan 30 ?- Jan 31 in bathroom. Stood up to walk to bathroom, walked to bathroom. Washing your hands. Felt lightheaded, then passed out and felt going down. Came to quickly, very brief LOC. Got up and family helped back to bedroom. Rested and layed in bed ?- no issues leading up to that day ? ? ?Lightheadedness, vision blurry episodes daily. Can occur with with sitting or standing. Can be laying down and symptoms. Some palpitatons at times.  ? ?Sometimes if laying on left side and tilts head to left, feels like he may pass out. Resolves with position change.  ? ?Pcp started toprol, stopped norvasc.  ? ?Drinks 6-7 bottles water daily ?- 2020 carotid US minor carotid stenosis ? ?2.Chronic neck pain ?- followed by neurosurgery ? ? ? ?3. Preoperative evaluation ?- considering colonscopy Micheal Ross ?-carpal tunnel surgery Micheal Ross ? ? ? ?Past Medical History:  ?Diagnosis Date  ? Brain tumor ([place])   ? 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)  ? Hypercholesterolemia   ? Hypertension   ? Neck pain   ? Shoulder pain   ? Sleep apnea   ? set to get tested soon 01/20/19  ? ? ? ?Allergies  ?Allergen Reactions  ? Pollen Extract   ? ? ? ?Current Outpatient Medications  ?Medication Sig Dispense Refill  ? acetaminophen (TYLENOL) 500 MG tablet Take 500 mg by mouth as needed.    ? amLODipine (NORVASC) 10 MG tablet Take 5 mg by mouth daily. Pt takes 5 mg daily.    ? aspirin EC 81 MG tablet Take 81 mg by mouth as  needed. Swallow whole. Take 1 tablet twice a week    ? gabapentin (NEURONTIN) 300 MG capsule Take 300 mg by mouth 2 (two) times daily.    ? lisinopril (ZESTRIL) 10 MG tablet Take 10 mg by mouth daily.    ? lovastatin (MEVACOR) 40 MG tablet Take 1 tablet (40 mg total) by mouth every morning. 90 tablet 3  ? Multiple Vitamins-Minerals (ONE-A-DAY MENS 50+ ADVANTAGE) TABS Take 1 tablet by mouth daily.    ? ?No current facility-administered medications for this visit.  ? ? ? ?Past Surgical History:  ?Procedure Laterality Date  ? ANKLE SURGERY    ? brain tumor removal  2014  ? COLONOSCOPY WITH PROPOFOL N/A 01/13/2019  ? Procedure: COLONOSCOPY WITH PROPOFOL;  Surgeon: Micheal Ross;  20 mm polyp found in the cecum that was removed via piecemeal hot snare polypectomy. Repeat in 4 months. path tubulovillous adenoma, negative for high-grade dysplasia  ? COLONOSCOPY WITH PROPOFOL N/A 01/20/2019  ? Procedure: COLONOSCOPY WITH PROPOFOL;  Surgeon: Micheal Ross; large defect (2 cm x 1.5 cm with small visible vessel with stigmata of recent bleed) in the cecum.  3 hemostatic clips placed (MR conditional).  One 3 mm polyp in the cecum, tortuous colon, external and internal hemorrhoids.  Pathology revealed tubular adenoma.  ?  HERNIA REPAIR  9233  ? umbilical  ? NASAL SEPTOPLASTY W/ TURBINOPLASTY Bilateral 11/30/2020  ? Procedure: NASAL SEPTOPLASTY WITH BILATERAL  TURBINATE REDUCTION;  Surgeon: Micheal Ross;  Location: [facility];  Service: ENT;  Laterality: Bilateral;  ? POLYPECTOMY  01/13/2019  ? Procedure: POLYPECTOMY;  Surgeon: Micheal Ross;  [facility];  Service: Endoscopy;;  Large Cecal Polyp   ? ? ? ?Allergies  ?Allergen Reactions  ? Pollen Extract   ? ? ? ? ?Family History  ?Problem Relation Age of Onset  ? Thyroid disease Mother   ? COPD Father   ? Cancer Maternal Aunt   ? Cancer Maternal Uncle   ?     back  ? Cancer Paternal Uncle   ?     throat   ? Cancer Cousin   ?     prostate  ?  Colon cancer Neg Hx   ? Colon polyps Neg Hx   ? ? ? ?Social History ?Micheal Ross reports that he quit smoking about 38 years ago. His smoking use included cigarettes. He has a 1.00 pack-year smoking history. He has never used smokeless tobacco. ?Micheal Ross reports that he does not currently use alcohol. ? ? ?Review of Systems ?CONSTITUTIONAL: No weight loss, fever, chills, weakness or fatigue.  ?HEENT: Eyes: No visual loss, blurred vision, double vision or yellow sclerae.No hearing loss, sneezing, congestion, runny nose or sore throat.  ?SKIN: No rash or itching.  ?CARDIOVASCULAR: per hpi ?RESPIRATORY: No shortness of breath, cough or sputum.  ?GASTROINTESTINAL: No anorexia, nausea, vomiting or diarrhea. No abdominal pain or blood.  ?GENITOURINARY: No burning on urination, no polyuria ?NEUROLOGICAL: per hpi  ?MUSCULOSKELETAL: No muscle, back pain, joint pain or stiffness.  ?LYMPHATICS: No enlarged nodes. No history of splenectomy.  ?PSYCHIATRIC: No history of depression or anxiety.  ?ENDOCRINOLOGIC: No reports of sweating, cold or heat intolerance. No polyuria or polydipsia.  ?. ? ? ?Physical Examination ?Today's Vitals  ? 06/20/21 1303  ?Weight: 293 lb (132.9 kg)  ?Height: '5\' 10"'$  (1.778 m)  ? ?Body mass index is 42.04 kg/m?. ? ?Gen: resting comfortably, no acute distress ?HEENT: no scleral icterus, pupils equal round and reactive, no palptable cervical adenopathy,  ?CV: RRR, no m/r/g no jvd ?Resp: Clear to auscultation bilaterally ?GI: abdomen is soft, non-tender, non-distended, normal bowel sounds, no hepatosplenomegaly ?MSK: extremities are warm, no edema.  ?Skin: warm, no rash ?Neuro:  no focal deficits ?Psych: appropriate affect ? ? ? ? ?Assessment and Plan  ?Syncope ?- unclear etiology ?- pcp has already completed an echo which had limited visualization but no clear significant structural heart disease ?- pcp had completed 14 day monitor, just 3 episodes of SVT longest 8 beats, frequent PVCs 6% burden but no  NSVT or VT. Essentially benign monitor as far as cause of syncope ?- orthostatics negative in clinic ?- given symptoms related to head movement have recommended he f/u with neurology. Cardiac testing does not show cardiac cause and symptoms themselves not overall consistent with cardiogenic syncoope ? ?2. SVT ?- rare short episodes ?- on toprol, can continue. Palpitations are controlled ? ? ?F/u with cardiology as needed ? ? ? ? ? ?Micheal Ross, M.D., F.A.C.C. ?

## 2021-06-20 NOTE — Telephone Encounter (Signed)
Pt was calling to schedule his colonoscopy. He said he was cleared by his cardiologist. [phone] ?

## 2021-06-20 NOTE — Patient Instructions (Addendum)
Medication Instructions:  ?Continue all current medications. ? ?Labwork: ?none ? ?Testing/Procedures: ?none ? ?Follow-Up: ? As needed ? ?Any Other Special Instructions Will Be Listed Below (If Applicable). ?You have been referred to:  Neurology  ? ?If you need a refill on your cardiac medications before your next appointment, please call your pharmacy. ? ?

## 2021-06-21 NOTE — Telephone Encounter (Signed)
He needs office visit. He was referred to Neurology next. We need that all wrapped up before pursuing colonoscopy.  ?

## 2021-06-21 NOTE — Telephone Encounter (Signed)
Spoke to pt.  He was made aware to contact us once he has been cleared by neurologist.  He voiced understanding.  He will need ov with [name] when he calls back to pursue colonoscopy. ?

## 2021-06-21 NOTE — Telephone Encounter (Signed)
Roseanne Kaufman, NP:  This is a former triage that you reviewed.  Do you recommend triage or ov? Recently seen cardiologist.   ?

## 2021-06-27 ENCOUNTER — Telehealth: Payer: Self-pay | Admitting: Gastroenterology

## 2021-06-27 NOTE — Progress Notes (Deleted)
GI Office Note    Referring Provider: [name], MD Primary Care Physician:  [name], MD  Primary GI: Dr. Gala Romney however patient request Dr. Abbey Chatters to perform procedure.   Chief Complaint   No chief complaint on file.    History of Present Illness   Micheal Axel. is a 61 y.o. male presenting today at the request of [name], MD for evaluation prior to scheduling surveillance colonoscopy.   TCS completed 01/13/2019 large 20 mm tubulovillous adenoma, with piecemeal resection.  Developed post polypectomy bleeding.  Had repeat TCS 01/20/2019  with small visible vessel with recent stigmata of bleeding in the cecum (3 clips placed along with Hemospray), one 3 mm polyp in the cecum (tubular adenoma), tortuous colon.  18-monthsurveillance recommended.  Patient called in January to see if he was due for TCS, given pathologic history of tubulovillous adenoma he will be put at 3-year surveillance, however he never had his 639-monthepeat surveillance TCS after his post polypectomy bleeding colonoscopy.  Patient requested for Dr. CaAbbey Chatterso perform his colonoscopy as he felt like things did not go well with Dr. RoGala Romney Dr. CaAbbey Chattersgreed and patient was scheduled for TCS in January, however patient had been having dizziness and syncope and he was being evaluated by cardiology with a Holter monitor.  Patient then called June 20, 2021 reporting that he was cleared by his cardiologist to proceed with colonoscopy.   Per chart review he last saw cardiology on 06/20/2021 who reported that his syncope does not appear to be of cardiogenic etiology as his orthostatics have been negative, and 14-day Holter monitor was essentially benign.  He also has had an echo without significant structural heart disease.  His symptoms are reportedly related to head movement therefore cardiology recommended follow-up with neurology.  Per chart review does not appear as though patient has seen neurology,     Today:    Past Medical History:  Diagnosis Date   [place]   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)   Hypercholesterolemia    Hypertension    Neck pain    Shoulder pain    Sleep apnea    set to get tested soon 01/20/19    Past Surgical History:  Procedure Laterality Date   ANKLE SURGERY     brain tumor removal  2014   COLONOSCOPY WITH PROPOFOL N/A 01/13/2019   Procedure: COLONOSCOPY WITH PROPOFOL;  Surgeon: RoDaneil DolinMD;  20 mm polyp found in the cecum that was removed via piecemeal hot snare polypectomy. Repeat in 4 months. path tubulovillous adenoma, negative for high-grade dysplasia   COLONOSCOPY WITH PROPOFOL N/A 01/20/2019   Procedure: COLONOSCOPY WITH PROPOFOL;  Surgeon: FiDanie BinderMD; large defect (2 cm x 1.5 cm with small visible vessel with stigmata of recent bleed) in the cecum.  3 hemostatic clips placed (MR conditional).  One 3 mm polyp in the cecum, tortuous colon, external and internal hemorrhoids.  Pathology revealed tubular adenoma.   HERNIA REPAIR  201610 umbilical   NASAL SEPTOPLASTY W/ TURBINOPLASTY Bilateral 11/30/2020   Procedure: NASAL SEPTOPLASTY WITH BILATERAL  TURBINATE REDUCTION;  Surgeon: TeLeta BaptistMD;  [place];  Laterality: Bilateral;   POLYPECTOMY  01/13/2019   Procedure: POLYPECTOMY;  Surgeon: RoDaneil DolinMD;  [facility];  Service: Endoscopy;;  Large Cecal Polyp     Current Outpatient Medications  Medication Sig Dispense Refill   acetaminophen (  TYLENOL) 500 MG tablet Take 500 mg by mouth as needed.     aspirin EC 81 MG tablet Take 81 mg by mouth as needed. Swallow whole. Take 1 tablet twice a week     fluticasone (FLONASE) 50 MCG/ACT nasal spray Place 2 sprays into both nostrils daily.     gabapentin (NEURONTIN) 300 MG capsule Take 300 mg by mouth 2 (two) times daily.     IBUPROFEN PO Take 300 mg by mouth as needed.     lisinopril (ZESTRIL) 10 MG tablet Take 10 mg by mouth  daily.     lovastatin (MEVACOR) 40 MG tablet Take 1 tablet (40 mg total) by mouth every morning. 90 tablet 3   metoprolol succinate (TOPROL-XL) 25 MG 24 hr tablet Take 25 mg by mouth daily.     Multiple Vitamins-Minerals (ONE-A-DAY MENS 50+ ADVANTAGE) TABS Take 1 tablet by mouth daily.     No current facility-administered medications for this visit.    Allergies as of 06/28/2021 - Review Complete 06/20/2021  Allergen Reaction Noted   Pollen extract  01/01/2021    Family History  Problem Relation Age of Onset   Thyroid disease Mother    COPD Father    Cancer Maternal Aunt    Cancer Maternal Uncle        back   Cancer Paternal Uncle        throat    Cancer Cousin        prostate   Colon cancer Neg Hx    Colon polyps Neg Hx     Social History   Socioeconomic History   Marital status: Married    Spouse name: Not on file   Number of children: Not on file   Years of education: Not on file   Highest education level: Not on file  Occupational History   Not on file  Tobacco Use   Smoking status: Former    Packs/day: 0.50    Years: 2.00    Pack years: 1.00    Types: Cigarettes    Quit date: 03/30/1983    Years since quitting: 38.2   Smokeless tobacco: Never  Vaping Use   Vaping Use: Never used  Substance and Sexual Activity   Alcohol use: Not Currently    Comment: occ   Drug use: No   Sexual activity: Yes    Comment: married for 1985, 6 grown kids  Other Topics Concern   Not on file  Social History Narrative   Not on file   Social Determinants of Health   Financial Resource Strain: Not on file  Food Insecurity: Not on file  Transportation Needs: Not on file  Physical Activity: Not on file  Stress: Not on file  Social Connections: Not on file  Intimate Partner Violence: Not on file     Review of Systems   Gen: Denies any fever, chills, fatigue, weight loss, lack of appetite.  CV: Denies chest pain, heart palpitations, peripheral edema, syncope.  Resp:  Denies shortness of breath at rest or with exertion. Denies wheezing or cough.  GI: Denies dysphagia or odynophagia. Denies jaundice, hematemesis, fecal incontinence. GU : Denies urinary burning, urinary frequency, urinary hesitancy MS: Denies joint pain, muscle weakness, cramps, or limitation of movement.  Derm: Denies rash, itching, dry skin Psych: Denies depression, anxiety, memory loss, and confusion Heme: Denies bruising, bleeding, and enlarged lymph nodes.   Physical Exam   There were no vitals taken for this visit. General:   Alert and  oriented. Pleasant and cooperative. Well-nourished and well-developed.  Head:  Normocephalic and atraumatic. Eyes:  Without icterus, sclera clear and conjunctiva pink.  Ears:  Normal auditory acuity. Mouth:  No deformity or lesions, oral mucosa pink.  Lungs:  Clear to auscultation bilaterally. No wheezes, rales, or rhonchi. No distress.  Heart:  S1, S2 present without murmurs appreciated.  Abdomen:  +BS, soft, non-tender and non-distended. No HSM noted. No guarding or rebound. No masses appreciated.  Rectal:  Deferred  Msk:  Symmetrical without gross deformities. Normal posture. Extremities:  Without edema. Neurologic:  Alert and  oriented x4;  grossly normal neurologically. Skin:  Intact without significant lesions or rashes. Psych:  Alert and cooperative. Normal mood and affect.   Assessment   Micheal Sekai Gitlin. is a 61 y.o. male with a history of colon polyps and post polypectomy bleed, HTN, HLD, sleep apnea, and meningioma (removed in 2014)*** presenting today for evaluation prior to scheduling surveillance colonoscopy.   History of colon polyps:   PLAN   *** Proceed with colonoscopy by Dr. Abbey Chatters  in near future: the risks, benefits, and alternatives have been discussed with the patient in detail. The patient states understanding and desires to proceed.    Venetia Night, MSN, FNP-BC, AGACNP-BC [facility]

## 2021-06-27 NOTE — Telephone Encounter (Signed)
Micheal Ross -patient was to see neurology for evaluation prior to seeing [name] for evaluation of colonoscopy.  I do not want to waste the patient's time or have him be charged for a visit since he has not seen them yet.  It appears that he has an appointment with them in a few weeks.  Please ask him to reschedule after he has seen neurology and they have cleared him.  ? ?See prior telephone note by Angie who advised the patient to contact us after he has been cleared by neurology. ? ?Venetia Night, MSN, FNP-BC, AGACNP-BC ?[facility] ? ?

## 2021-06-28 ENCOUNTER — Ambulatory Visit: Payer: 59 | Admitting: Gastroenterology

## 2021-07-07 ENCOUNTER — Ambulatory Visit: Payer: 59 | Admitting: Orthopaedic Surgery

## 2021-07-18 ENCOUNTER — Encounter: Payer: Self-pay | Admitting: Neurology

## 2021-07-18 ENCOUNTER — Telehealth: Payer: Self-pay | Admitting: *Deleted

## 2021-07-18 ENCOUNTER — Ambulatory Visit: Payer: 59 | Admitting: Neurology

## 2021-07-18 VITALS — BP 133/89 | HR 77 | Ht 70.0 in | Wt 297.0 lb

## 2021-07-18 DIAGNOSIS — M5412 Radiculopathy, cervical region: Secondary | ICD-10-CM | POA: Diagnosis not present

## 2021-07-18 DIAGNOSIS — R42 Dizziness and giddiness: Secondary | ICD-10-CM | POA: Diagnosis not present

## 2021-07-18 MED ORDER — PREGABALIN 150 MG PO CAPS: 300.0000 mg | ORAL_CAPSULE | Freq: Two times a day (BID) | ORAL | 3 refills | Status: DC

## 2021-07-18 MED ORDER — METHOCARBAMOL 500 MG PO TABS: 500.0000 mg | ORAL_TABLET | Freq: Four times a day (QID) | ORAL | 0 refills | Status: DC | PRN

## 2021-07-18 NOTE — Patient Instructions (Addendum)
Discontinue gabapentin ?Start Lyrica 150 mg twice daily, can increase to 300 mg twice daily as tolerated ?Start methocarbamol as needed for muscle spasm ?Consider acupuncture and massage  ?Referral to physical therapy ?Follow-up with orthopedic surgery for additional injection as needed, and to discuss surgical option if pain is not improved after completion of PT.  ?Return in 3 months ?

## 2021-07-18 NOTE — Progress Notes (Signed)
? ?[facility] ? ?PATIENT: Micheal Ross. ?DOB: 1960/03/14 ? ?REQUESTING CLINICIAN: Branch, Alphonse Guild, MD ?HISTORY FROM: Patient and spouse  ?REASON FOR VISIT: Cervical radiculopathy ? ? ?HISTORICAL ? ?CHIEF COMPLAINT:  ?Chief Complaint  ?Patient presents with  ? New Patient (Initial Visit)  ?  Room 13 with wife  ?NP/Internal referral for dizziness/syncope/ ?Last event took place feb 2023, sx started jan 2023 ?Today c/o lightheadedness, blurry vision, shooting neck and leg pain, states sx occur while standing, walking and laying down,  ?Pt requesting new CT or MRI  ? ? ?HISTORY OF PRESENT ILLNESS:  ?This is a 61 year old gentleman past medical history hypertension, hyperlipidemia, obesity, history of left frontal meningioma that was resected who is presenting with complaint of neck pain radiating to the left shoulder since November 2021.  Patient reports pain is almost constant, described as sharp stabbing pain worse when moving his head to the left or laying on his left side.  He did have MRI of the neck which showed mild to moderate bilateral foraminal narrowing but no signs of myelopathy.  He did have a nerve conduction study which showed bilateral cervical radiculopathy especially involving C8-T1 on the left side and moderate left carpal tunnel syndrome and mild right carpal tunnel syndrome.  ?He did follow-up with the orthopedic surgery who recommend conservative management for the cervical radiculopathy and surgical management of the left carpal tunnel syndrome. Currently he is on gabapentin 300 mg twice daily but reported help his pain is not controlled. He did completed physical therapy early last year but again no improvement of his symptoms. ? ?Patient also has been reporting of intermittent lightheadedness.  He did report having orthostatic hypotension in the past, his primary care doctor changed his blood pressure medications and since then he has been doing well.  He reported  in January he did have a syncopal episode, lasted about 10 to 20 seconds with quick regain of consciousness, no confusion.  He reported if he held his head back or to the left he will feel lightheaded like he is going to pass out. ? ? ?OTHER MEDICAL CONDITIONS: Hypertension, Hyperlipidemia, Obesity, left frontal meningioma (resected)  ? ? ?REVIEW OF SYSTEMS: Full 14 system review of systems performed and negative with exception of: as noted in the HPI  ? ?ALLERGIES: ?Allergies  ?Allergen Reactions  ? Pollen Extract   ? ? ?HOME MEDICATIONS: ?Outpatient Medications Prior to Visit  ?Medication Sig Dispense Refill  ? acetaminophen (TYLENOL) 500 MG tablet Take 500 mg by mouth as needed.    ? aspirin EC 81 MG tablet Take 81 mg by mouth as needed. Swallow whole. Take 1 tablet twice a week    ? fluticasone (FLONASE) 50 MCG/ACT nasal spray Place 2 sprays into both nostrils daily.    ? IBUPROFEN PO Take 300 mg by mouth as needed.    ? lisinopril (ZESTRIL) 10 MG tablet Take 10 mg by mouth daily.    ? metoprolol succinate (TOPROL-XL) 25 MG 24 hr tablet Take 25 mg by mouth daily.    ? Multiple Vitamins-Minerals (ONE-A-DAY MENS 50+ ADVANTAGE) TABS Take 1 tablet by mouth daily.    ? rosuvastatin (CRESTOR) 5 MG tablet Take 5 mg by mouth daily.    ? gabapentin (NEURONTIN) 300 MG capsule Take 300 mg by mouth 2 (two) times daily.    ? lovastatin (MEVACOR) 40 MG tablet Take 1 tablet (40 mg total) by mouth every morning. 90 tablet 3  ? ?No facility-administered medications  prior to visit.  ? ? ?PAST MEDICAL HISTORY: ?Past Medical History:  ?Diagnosis Date  ? Brain tumor ([place])   ? 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)  ? Hypercholesterolemia   ? Hypertension   ? Neck pain   ? Shoulder pain   ? Sleep apnea   ? set to get tested soon 01/20/19  ? ? ?PAST SURGICAL HISTORY: ?Past Surgical History:  ?Procedure Laterality Date  ? ANKLE SURGERY    ? brain tumor removal  2014  ? COLONOSCOPY WITH PROPOFOL N/A 01/13/2019  ? Procedure: COLONOSCOPY WITH  PROPOFOL;  Surgeon: Daneil Dolin, MD;  20 mm polyp found in the cecum that was removed via piecemeal hot snare polypectomy. Repeat in 4 months. path tubulovillous adenoma, negative for high-grade dysplasia  ? COLONOSCOPY WITH PROPOFOL N/A 01/20/2019  ? Procedure: COLONOSCOPY WITH PROPOFOL;  Surgeon: Danie Binder, MD; large defect (2 cm x 1.5 cm with small visible vessel with stigmata of recent bleed) in the cecum.  3 hemostatic clips placed (MR conditional).  One 3 mm polyp in the cecum, tortuous colon, external and internal hemorrhoids.  Pathology revealed tubular adenoma.  ? HERNIA REPAIR  [id number]  ? umbilical  ? NASAL SEPTOPLASTY W/ TURBINOPLASTY Bilateral 11/30/2020  ? Procedure: NASAL SEPTOPLASTY WITH BILATERAL  TURBINATE REDUCTION;  Surgeon: Leta Baptist, MD;  Location: [place];  Service: ENT;  Laterality: Bilateral;  ? POLYPECTOMY  01/13/2019  ? Procedure: POLYPECTOMY;  Surgeon: Daneil Dolin, MD;  [facility];  Service: Endoscopy;;  Large Cecal Polyp   ? ? ?FAMILY HISTORY: ?Family History  ?Problem Relation Age of Onset  ? Thyroid disease Mother   ? COPD Father   ? Cancer Maternal Aunt   ? Cancer Maternal Uncle   ?     back  ? Cancer Paternal Uncle   ?     throat   ? Cancer Cousin   ?     prostate  ? Colon cancer Neg Hx   ? Colon polyps Neg Hx   ? ? ?SOCIAL HISTORY: ?Social History  ? ?Socioeconomic History  ? Marital status: Married  ?  Spouse name: Not on file  ? Number of children: Not on file  ? Years of education: Not on file  ? Highest education level: Not on file  ?Occupational History  ? Not on file  ?Tobacco Use  ? Smoking status: Former  ?  Packs/day: 0.50  ?  Years: 2.00  ?  Pack years: 1.00  ?  Types: Cigarettes  ?  Quit date: 03/30/1983  ?  Years since quitting: 38.3  ? Smokeless tobacco: Never  ?Vaping Use  ? Vaping Use: Never used  ?Substance and Sexual Activity  ? Alcohol use: Not Currently  ?  Comment: occ  ? Drug use: No  ? Sexual activity: Yes  ?  Comment:  married for 1985, 6 grown kids  ?Other Topics Concern  ? Not on file  ?Social History Narrative  ? Not on file  ? ?Social Determinants of Health  ? ?Financial Resource Strain: Not on file  ?Food Insecurity: Not on file  ?Transportation Needs: Not on file  ?Physical Activity: Not on file  ?Stress: Not on file  ?Social Connections: Not on file  ?Intimate Partner Violence: Not on file  ? ? ?PHYSICAL EXAM ? ?GENERAL EXAM/CONSTITUTIONAL: ?Vitals:  ?Vitals:  ? 07/18/21 0820  ?BP: 133/89  ?Pulse: 77  ?Weight: 297 lb (134.7 kg)  ?Height: '5\' 10"'$  (1.778 m)  ? ?  Body mass index is 42.62 kg/m?. ?Wt Readings from Last 3 Encounters:  ?07/18/21 297 lb (134.7 kg)  ?06/20/21 293 lb (132.9 kg)  ?04/28/21 285 lb (129.3 kg)  ? ?Patient is in no distress; well developed, nourished and groomed; neck is supple ? ?EYES: ?Pupils round and reactive to light, Visual fields full to confrontation, Extraocular movements intacts,  ? ?MUSCULOSKELETAL: ?Gait, strength, tone, movements noted in Neurologic exam below. He does have left cervical paraspinal muscle tenderness ? ?NEUROLOGIC: ?MENTAL STATUS:  ?   ? View : No data to display.  ?  ?  ?  ? ?awake, alert, oriented to person, place and time ?recent and remote memory intact ?normal attention and concentration ?language fluent, comprehension intact, naming intact ?fund of knowledge appropriate ? ?CRANIAL NERVE:  ?2nd, 3rd, 4th, 6th - pupils equal and reactive to light, visual fields full to confrontation, extraocular muscles intact, no nystagmus ?5th - facial sensation symmetric ?7th - facial strength symmetric ?8th - hearing intact ?9th - palate elevates symmetrically, uvula midline ?11th - shoulder shrug symmetric ?12th - tongue protrusion midline ? ?MOTOR:  ?normal bulk and tone, full strength in the BUE, BLE with good effort but the left is limited by pain ? ?SENSORY:  ?normal and symmetric to light touch, pinprick, vibration ? ?COORDINATION:  ?finger-nose-finger, fine finger movements  normal ? ?REFLEXES:  ?deep tendon reflexes present and symmetric ? ?GAIT/STATION:  ?normal ? ?DIAGNOSTIC DATA (LABS, IMAGING, TESTING) ?- I reviewed patient records, labs, notes, testing and imaging myself where availa

## 2021-07-18 NOTE — Telephone Encounter (Addendum)
PA for pregabalin '150mg'$  started on covermymeds (key: [id number]). Pharmacy coverage through [employer] [phone]). Decision pending.  ?

## 2021-07-20 ENCOUNTER — Telehealth: Payer: Self-pay

## 2021-07-20 ENCOUNTER — Encounter: Payer: Self-pay | Admitting: *Deleted

## 2021-07-20 MED ORDER — PREGABALIN 150 MG PO CAPS: 150.0000 mg | ORAL_CAPSULE | Freq: Two times a day (BID) | ORAL | 1 refills | Status: DC

## 2021-07-20 NOTE — Telephone Encounter (Addendum)
PA denied. His plan will not cover pregabalin for cervical radiculopathy. It is considered chronic pain and that caused it not to meet medical necessity per their guidelines. They also had quantity limits outlined (#90/30 of '150mg'$  caps, #60/30 of '300mg'$  caps). Rx voided at [facility]. ? ?I have spoke to Dr. April Manson about this problem. He is going to send in a 90-day supply of pregabalin '150mg'$ , one cap BID to [facility] in [place]. The patient is agreeable to use a goodrx.com coupon. ? ?#180 cost: ?$24.65-$31.65 ?BIN: [id number] ?PCN: [id number] ?Group: [id number] ?Member ID: [id number] ?

## 2021-07-20 NOTE — Telephone Encounter (Signed)
Pt called and LMOVM stating he had seen the Neurologist and was cleared for his colonoscopy. The report is in [place]. Pt states he is ready for his colonoscopy. ?

## 2021-07-20 NOTE — Addendum Note (Signed)
Addended by: Noberto Retort C on: 07/20/2021 03:07 PM ? ? Modules accepted: Orders ? ?

## 2021-07-21 NOTE — Telephone Encounter (Signed)
Called pt, relayed Dr. Jabier Mutton message. He verbalized understanding and appreciation.

## 2021-07-21 NOTE — Telephone Encounter (Signed)
Continue with Gabapentin today and start Lyrica tomorrow.   Thank you  Dr. April Manson

## 2021-07-21 NOTE — Telephone Encounter (Signed)
At 10:33 pt left a vm asking for a call back from Dr Jabier Mutton RN.  He stated he has a question re: the new medication that is being delayed for a day.

## 2021-07-21 NOTE — Telephone Encounter (Signed)
Called pt back. Pharmacy was out of stock for the Lyrica and had to order it. Should be in this afternoon, tomorrow at the latest for him to pick up. He is aware to stop gabapentin '300mg'$  once he starts Lyrica. His question is that he took gabapentin '300mg'$  this am. If he is able to pick up Lyrica this afternoon, can he take Lyrica today since he took gabapentin or should he wait until tomorrow to start Lyrica? He typically takes gabapentin '300mg'$  po TID.

## 2021-07-21 NOTE — Telephone Encounter (Signed)
Per Micheal Ross's prior recommendations, he would need an OV once cleared. He is already scheduled to see Micheal Ross in July.

## 2021-07-21 NOTE — Telephone Encounter (Signed)
Roseanne Kaufman, NP:  This is a prior triage that had issues cardiac and neurological wise. He says that he has been cleared per note.  Would you recommend ov in office or nurse visit?

## 2021-07-22 NOTE — Telephone Encounter (Signed)
noted 

## 2021-07-26 ENCOUNTER — Telehealth: Payer: Self-pay | Admitting: Neurology

## 2021-07-26 NOTE — Telephone Encounter (Signed)
Charm Rings, This pt LMOVM regarding his neurology appt and scheduling colonoscopy

## 2021-07-26 NOTE — Telephone Encounter (Signed)
Referral for physical Therapy sent to [facility] [phone].

## 2021-07-26 NOTE — Telephone Encounter (Signed)
Pt called back.  He confirmed appointment in July and was given information that Venetia Night, NP provided.  Pt voiced understanding and is agreeable.

## 2021-07-26 NOTE — Telephone Encounter (Signed)
Lmom for pt to call me back. 

## 2021-07-28 ENCOUNTER — Encounter (HOSPITAL_COMMUNITY): Payer: Self-pay | Admitting: Physical Therapy

## 2021-07-28 ENCOUNTER — Ambulatory Visit (HOSPITAL_COMMUNITY): Payer: 59 | Attending: Neurology | Admitting: Physical Therapy

## 2021-07-28 DIAGNOSIS — R29898 Other symptoms and signs involving the musculoskeletal system: Secondary | ICD-10-CM | POA: Insufficient documentation

## 2021-07-28 DIAGNOSIS — M542 Cervicalgia: Secondary | ICD-10-CM | POA: Insufficient documentation

## 2021-07-28 DIAGNOSIS — R293 Abnormal posture: Secondary | ICD-10-CM | POA: Diagnosis present

## 2021-07-28 DIAGNOSIS — M5412 Radiculopathy, cervical region: Secondary | ICD-10-CM | POA: Diagnosis not present

## 2021-07-28 NOTE — Therapy (Signed)
OUTPATIENT PHYSICAL THERAPY CERVICAL EVALUATION   Patient Name: Micheal Ross. MRN: [id number] DOB:06-12-1960, 61 y.o., male Today's Date: 07/28/2021   PT End of Session - 07/28/21 1044     Visit Number 1    Number of Visits 8    Date for PT Re-Evaluation 08/25/21    Authorization Type [employer] (30 visits combined PTOT, ST, 0 used) (no auth unless 30 visits needed)    PT Start Time 1045    PT Stop Time 1132    PT Time Calculation (min) 47 min    Activity Tolerance Patient tolerated treatment well    Behavior During Therapy WFL for tasks assessed/performed             Past Medical History:  Diagnosis Date   Brain tumor ([place])    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)   Hypercholesterolemia    Hypertension    Neck pain    Shoulder pain    Sleep apnea    set to get tested soon 01/20/19   Past Surgical History:  Procedure Laterality Date   ANKLE SURGERY     brain tumor removal  2014   COLONOSCOPY WITH PROPOFOL N/A 01/13/2019   Procedure: COLONOSCOPY WITH PROPOFOL;  Surgeon: Daneil Dolin, MD;  20 mm polyp found in the cecum that was removed via piecemeal hot snare polypectomy. Repeat in 4 months. path tubulovillous adenoma, negative for high-grade dysplasia   COLONOSCOPY WITH PROPOFOL N/A 01/20/2019   Procedure: COLONOSCOPY WITH PROPOFOL;  Surgeon: Danie Binder, MD; large defect (2 cm x 1.5 cm with small visible vessel with stigmata of recent bleed) in the cecum.  3 hemostatic clips placed (MR conditional).  One 3 mm polyp in the cecum, tortuous colon, external and internal hemorrhoids.  Pathology revealed tubular adenoma.   HERNIA REPAIR  [id number]   umbilical   NASAL SEPTOPLASTY W/ TURBINOPLASTY Bilateral 11/30/2020   Procedure: NASAL SEPTOPLASTY WITH BILATERAL  TURBINATE REDUCTION;  Surgeon: Leta Baptist, MD;  Location: [place];  Service: ENT;  Laterality: Bilateral;   POLYPECTOMY  01/13/2019   Procedure: POLYPECTOMY;  Surgeon: Daneil Dolin, MD;   [facility];  Service: Endoscopy;;  Large Cecal Polyp    Patient Active Problem List   Diagnosis Date Noted   Carpal tunnel syndrome, left upper limb 04/28/2021   Bee sting 12/05/2019   Obstructive sleep apnea 03/31/2019   Acute blood loss anemia 01/20/2019   Tachycardia 01/20/2019   Lower GI bleed    Weakness of both lower extremities 01/15/2019   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   Rectal bleeding 01/15/2019   Gastroesophageal reflux disease 01/15/2019   Shortness of breath 01/15/2019   History of colonic polyps 11/14/2018   Hypertension 03/29/2018   Hyperlipidemia 03/29/2018   Meningioma ([place]) 09/20/2012    PCP: [name] MD  REFERRING PROVIDER: Alric Ran, MD   REFERRING DIAG: M54.12 (ICD-10-CM) - Cervical radiculopathy   THERAPY DIAG:  Cervicalgia  Abnormal posture  Other symptoms and signs involving the musculoskeletal system  Rationale for Evaluation and Treatment Rehabilitation  ONSET DATE: November 2021  SUBJECTIVE:  SUBJECTIVE STATEMENT: Patient states issues with neck, shoulder and lower back. L shoulder swells every day. Most issues in L neck too. Pain with pressure to spot. Ice pack 3x/day with relief. He was just put on new pain med last week and notes a little bit of difference. Went to neurologist who wanted him to try therapy.   PERTINENT HISTORY:  hypertension, hyperlipidemia, obesity, history of left frontal meningioma that was resected   PAIN:  Are you having pain? No and Yes: NPRS scale: 7/10 Pain location: shoulder  blade L  Pain description: sharp Aggravating factors: pressure, movement Relieving factors: ice  PRECAUTIONS: None  WEIGHT BEARING RESTRICTIONS No  FALLS:  Has patient fallen in last 6 months? Yes. Number of falls  1  LIVING ENVIRONMENT: Lives with: lives with their spouse Lives in: House/apartment Stairs: Yes: External: 6 steps; on right going up, on left going up, and can reach both Has following equipment at home: shower chair  OCCUPATION: applied for disability  PLOF: Independent  PATIENT GOALS be able to stop pain and move better  OBJECTIVE:   DIAGNOSTIC FINDINGS:  MRI 10/22 IMPRESSION: 1. 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 2. Mild left foraminal narrowing at C2-3. 3. Moderate right and mild left foraminal narrowing at C3-4. 4. Moderate foraminal narrowing bilaterally at C4-5 and C5-6 is worse on the right. 5. Mild right foraminal narrowing at C6-7. 6. Moderate foraminal narrowing bilaterally at C7-T1 is worse on the left.  PATIENT SURVEYS:  FOTO 31% function using back scale as uploaded for neck/shoulder  Observation:slight edema in L supraclavicular region and periscap region  COGNITION: Overall cognitive status: Within functional limits for tasks assessed   SENSATION: WFL  POSTURE: rounded shoulders, forward head, and increased thoracic kyphosis  PALPATION: TTP L middle trap, rhomboids, levator scap, UT, infraspinatus, scalene; edema in supraclavicular region L>R, L posterior superior scapular region  CERVICAL ROM:   Active ROM A/PROM  eval  Flexion 25% limited*  Extension 25% limited*  Right lateral flexion 0% limited  Left lateral flexion 25% limited*  Right rotation 0% limited  Left rotation 25% limited*   (Blank rows = not tested) * pain  UPPER EXTREMITY ROM: WFL   Active ROM Right eval Left eval  Shoulder flexion    Shoulder extension    Shoulder abduction    Shoulder adduction    Shoulder extension    Shoulder internal rotation    Shoulder external rotation    Elbow flexion    Elbow extension    Wrist flexion    Wrist extension    Wrist ulnar deviation    Wrist radial deviation    Wrist pronation    Wrist  supination     (Blank rows = not tested) *pain  UPPER EXTREMITY MMT:  MMT Right eval Left eval  Shoulder flexion 4+/5 4/5*  Shoulder extension    Shoulder abduction 4+/5 4/5*  Shoulder adduction    Shoulder extension    Shoulder internal rotation 5/5 5/5  Shoulder external rotation 5/5 5/5  Middle trapezius    Lower trapezius    Elbow flexion 5/5 5/5  Elbow extension 5/5 5/5  Wrist flexion    Wrist extension    Wrist ulnar deviation    Wrist radial deviation    Wrist pronation    Wrist supination    Grip strength WFL WFL   (Blank rows = not tested) *pain      TODAY'S TREATMENT:  07/28/21 Education   PATIENT  EDUCATION:  Education details: Patient educated on exam findings, POC, scope of PT, HEP, f/u with MD regarding supraclavicular edema, and lymphatic system. Person educated: Patient Education method: Explanation, Demonstration, and Handouts Education comprehension: verbalized understanding, returned demonstration, verbal cues required, and tactile cues required   HOME EXERCISE PROGRAM: 07/28/21 begin next session  ASSESSMENT:  CLINICAL IMPRESSION: Patient a 61 y.o. y.o. male who was seen today for physical therapy evaluation and treatment for neck. Patient presents with pain limited deficits in cervical spine strength, ROM, endurance, activity tolerance, and functional mobility with ADL. Patient also with supraclavicular edema R>L and edema in posterior superior scapular region and is educated on follow up with PCP regarding. Patient is having to modify and restrict ADL as indicated by outcome measure score as well as subjective information and objective measures which is affecting overall participation. Patient will benefit from skilled physical therapy in order to improve function and reduce impairment.    OBJECTIVE IMPAIRMENTS decreased activity tolerance, decreased endurance, decreased mobility, decreased ROM, decreased strength, hypomobility, increased  muscle spasms, impaired flexibility, improper body mechanics, postural dysfunction, obesity, and pain.   ACTIVITY LIMITATIONS carrying, lifting, bending, standing, squatting, sleeping, stairs, transfers, bed mobility, bathing, toileting, and reach over head  PARTICIPATION LIMITATIONS: meal prep, cleaning, laundry, driving, shopping, community activity, occupation, and yard work  PERSONAL FACTORS Fitness, Time since onset of injury/illness/exacerbation, and 3+ comorbidities: Allergies, Arthritis, Back pain, BMI over 30, Headaches, High Blood Pressure  are also affecting patient's functional outcome.   REHAB POTENTIAL: Good  CLINICAL DECISION MAKING: Evolving/moderate complexity  EVALUATION COMPLEXITY: Moderate   GOALS: Goals reviewed with patient? Yes  SHORT TERM GOALS: Target date: 08/11/2021  Patient will be independent with HEP in order to improve functional outcomes. Baseline:  Goal status: INITIAL  2.  Patient will report at least 25% improvement in symptoms for improved quality of life. Baseline:  Goal status: INITIAL    LONG TERM GOALS: Target date: 08/25/2021  Patient will report at least 75% improvement in symptoms for improved quality of life. Baseline:  Goal status: INITIAL  2.  Patient will improve FOTO score by at least 5 points in order to indicate improved tolerance to activity. Baseline: 31% function Goal status: INITIAL  3.  Patient will demonstrate at least 25% improvement in cervical ROM in all restricted planes for improved ability to move head with chores. Baseline: see AROM Goal status: INITIAL  4.  Patient will be able to return to all activities unrestricted for improved ability to perform work functions and participate with family.  Baseline:  Goal status: INITIAL  5.  Patient will demonstrate grade of 5/5 MMT grade in all tested musculature as evidence of improved strength to assist with lifting and dressing.  Baseline: see MMT Goal status:  INITIAL      PLAN: PT FREQUENCY: 2x/week  PT DURATION: 4 weeks  PLANNED INTERVENTIONS: Therapeutic exercises, Therapeutic activity, Neuromuscular re-education, Balance training, Gait training, Patient/Family education, Joint manipulation, Joint mobilization, Stair training, Orthotic/Fit training, DME instructions, Aquatic Therapy, Dry Needling, Electrical stimulation, Spinal manipulation, Spinal mobilization, Cryotherapy, Moist heat, Compression bandaging, scar mobilization, Splintting, Taping, Traction, Ultrasound, Ionotophoresis '4mg'$ /ml Dexamethasone, and Manual therapy  PLAN FOR NEXT SESSION: f/u with edema, cervical/scap mobility, levator and UT stretches. Postural strength, shoulder strength   Vianne Bulls Mischa Pollard, PT 07/28/2021, 1:00 PM

## 2021-08-02 ENCOUNTER — Other Ambulatory Visit (HOSPITAL_COMMUNITY): Payer: Self-pay | Admitting: Internal Medicine

## 2021-08-02 ENCOUNTER — Other Ambulatory Visit: Payer: Self-pay | Admitting: Internal Medicine

## 2021-08-02 DIAGNOSIS — R590 Localized enlarged lymph nodes: Secondary | ICD-10-CM

## 2021-08-03 ENCOUNTER — Encounter (HOSPITAL_COMMUNITY): Payer: 59 | Admitting: Physical Therapy

## 2021-08-04 ENCOUNTER — Encounter (HOSPITAL_BASED_OUTPATIENT_CLINIC_OR_DEPARTMENT_OTHER): Payer: Self-pay | Admitting: Orthopaedic Surgery

## 2021-08-04 ENCOUNTER — Other Ambulatory Visit: Payer: Self-pay

## 2021-08-05 ENCOUNTER — Ambulatory Visit (HOSPITAL_COMMUNITY)
Admission: RE | Admit: 2021-08-05 | Discharge: 2021-08-05 | Disposition: A | Discharge status: Home / Self Care | Payer: 59 | Source: Ambulatory Visit | Attending: Internal Medicine | Admitting: Internal Medicine

## 2021-08-05 DIAGNOSIS — R590 Localized enlarged lymph nodes: Secondary | ICD-10-CM | POA: Diagnosis not present

## 2021-08-05 LAB — POCT I-STAT CREATININE: Creatinine, Ser: 1.3 mg/dL — ABNORMAL HIGH (ref 0.61–1.24)

## 2021-08-05 IMAGING — CT CT NECK W/ CM
4 of 5 series · 14 of 33 positions shown, 16 images · IV contrast (Omnipaque or Isovue)
Comparison: Head CT [DATE].

CLINICAL DATA: 60-year-old male with bilateral neck swelling left
greater than right. Symptoms reportedly for 1.5 years. Nonpainful.
Dizziness.

EXAM:
CT NECK WITH CONTRAST
TECHNIQUE: Multidetector CT imaging of the neck was performed using the
standard protocol following the bolus administration of intravenous
contrast.

[Series 2: axial neck · axial · 0.64mm/px · z∈[-142,-72]mm · 2 of 106 slices shown]
[im 36/106  bone]
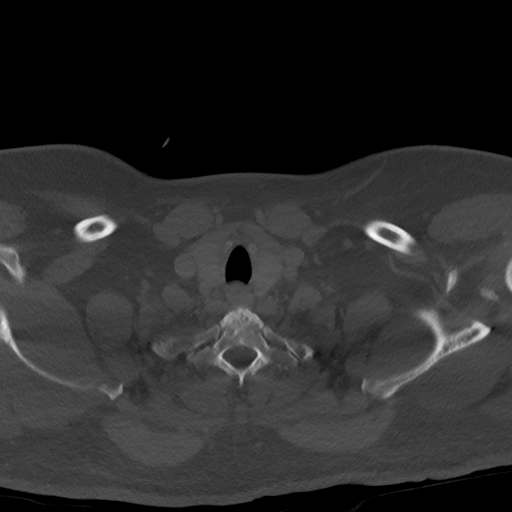
[im 71/106  bone]
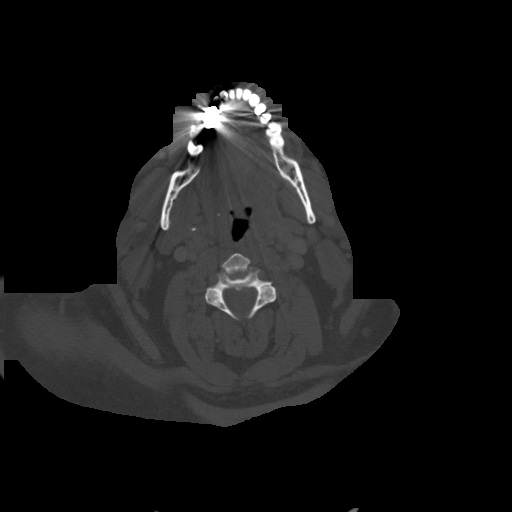

[Series 4: cor neck · coronal · 0.43mm/px · 3 of 136 slices shown]
[im 34/136  bone]
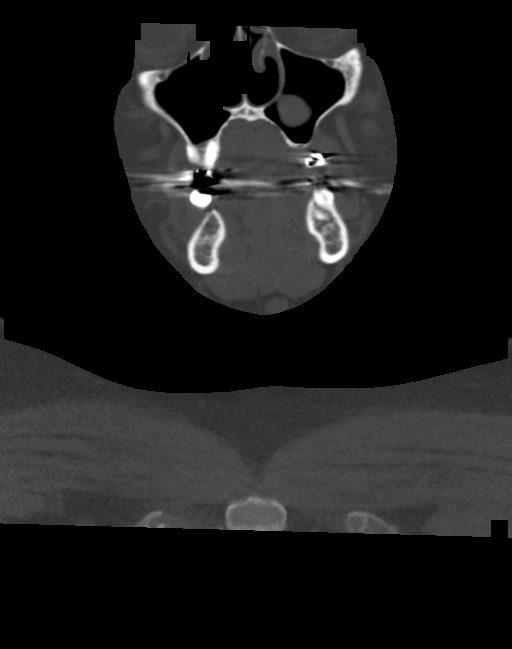
[im 57/136  bone]
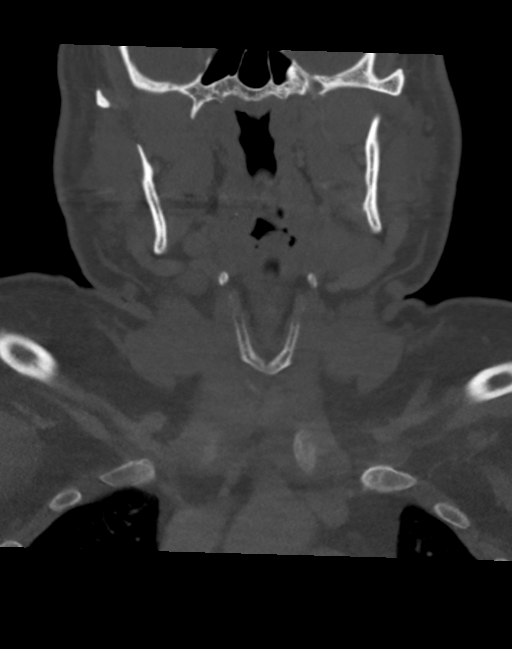
[im 80/136  bone]
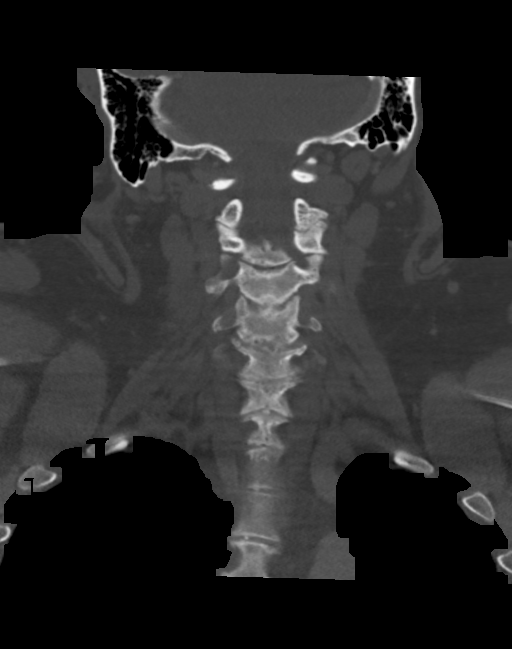

[Series 5: sag neck · sagittal · 0.53mm/px · 5 of 111 slices shown, 6 images]
[im 37/111  bone]
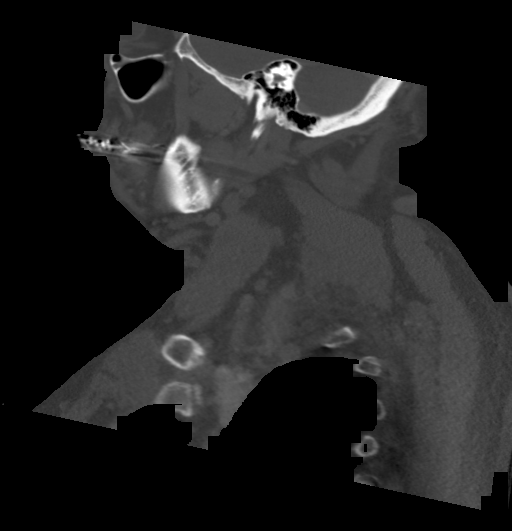
[im 46/111  bone]
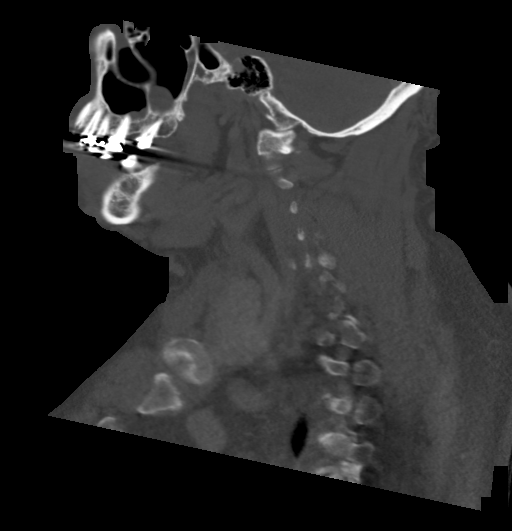
[im 56/111  soft-tissue]
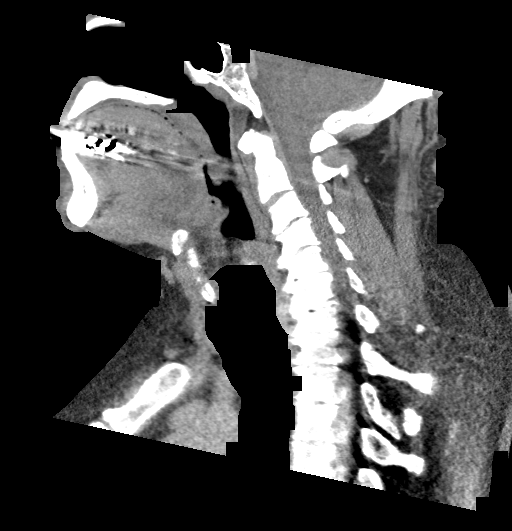
[im 56/111  bone]
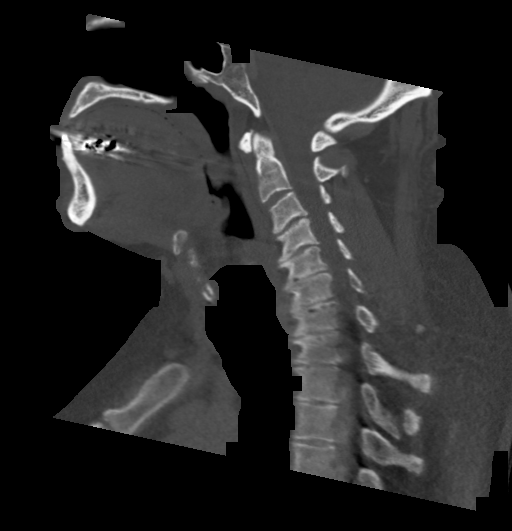
[im 65/111  bone]
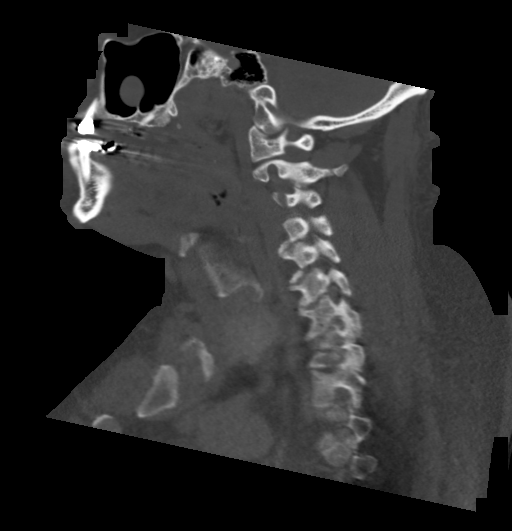
[im 74/111  bone]
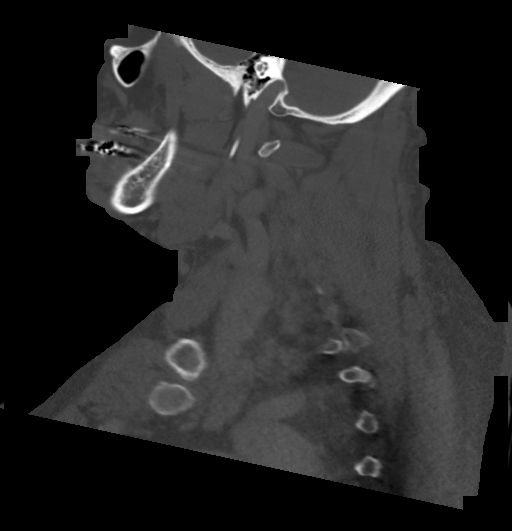

[Series 6: ax oropharynx (person_name) · axial · 0.43mm/px · z∈[-224,-62]mm · 4 of 139 slices shown, 5 images]
[im 28/139  soft-tissue]
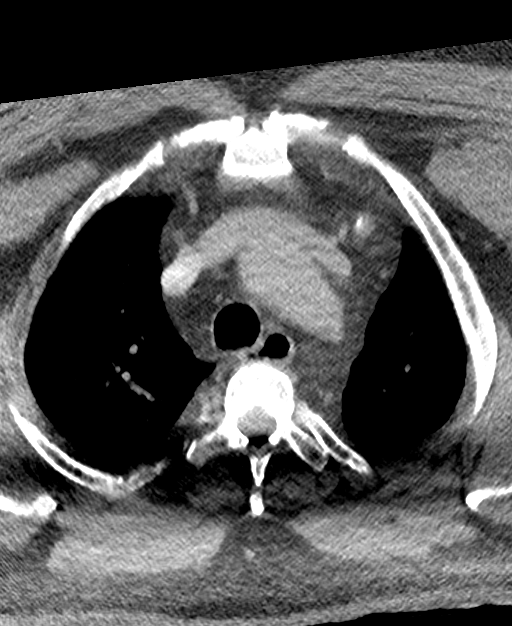
[im 28/139  bone]
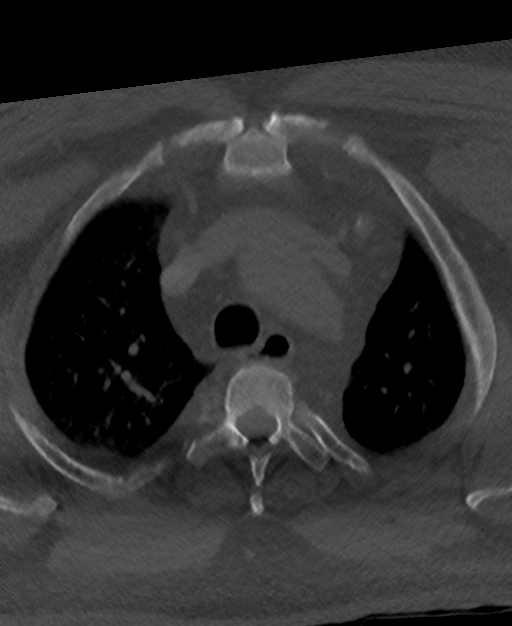
[im 56/139  bone]
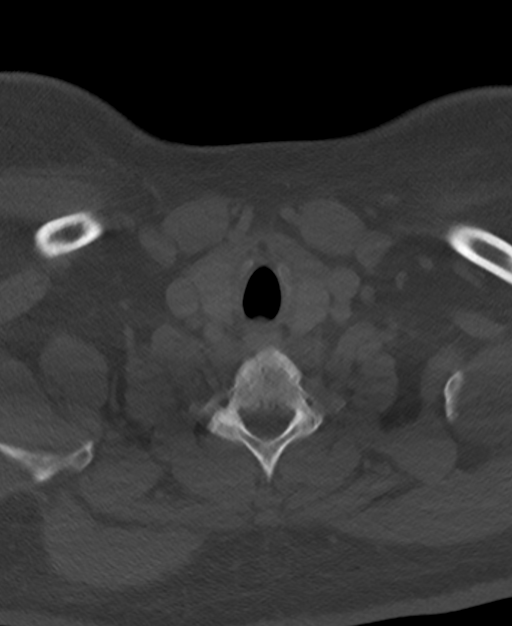
[im 83/139  bone]
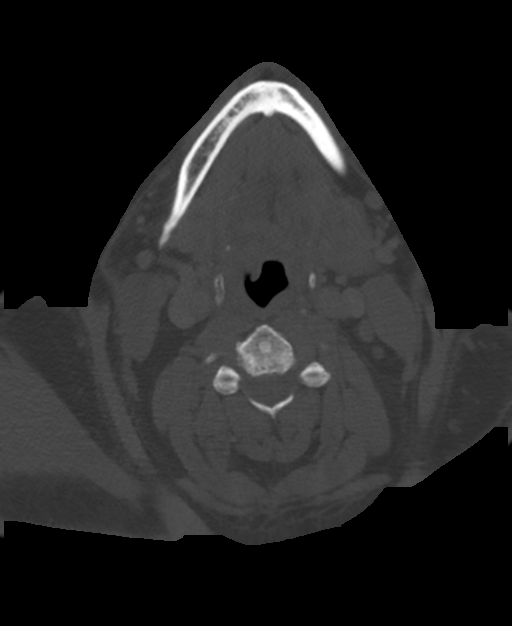
[im 111/139  bone]
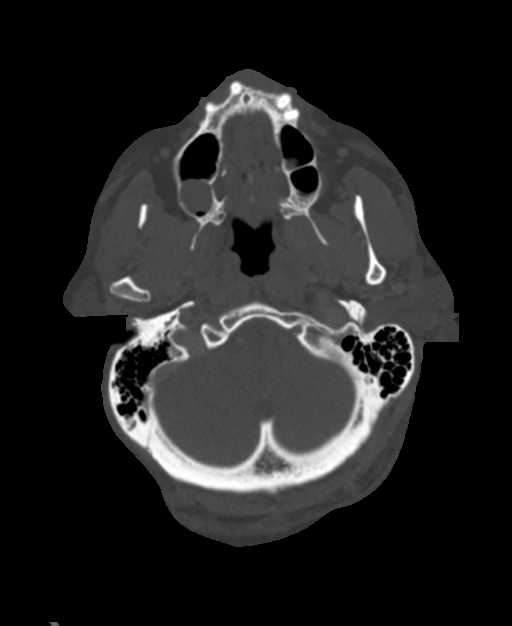

[14 of 33 positions shown; findings below may reference images not displayed]

RADIATION DOSE REDUCTION: This exam was performed according to the
departmental dose-optimization program which includes automated
exposure control, adjustment of the mA and/or kV according to
patient size and/or use of iterative reconstruction technique.

CONTRAST:  75mL OMNIPAQUE IOHEXOL 300 MG/ML  SOLN
FINDINGS: Pharynx and larynx: Mild motion artifact, including at the glottis
and supraglottic larynx. Mild postinflammatory calcifications of the
palatine tonsils. Overall the pharyngeal and laryngeal soft tissue
contours are within normal limits. Parapharyngeal and
retropharyngeal spaces appear negative.

Salivary glands: Negative.  Negative sublingual space.

Thyroid: Negative.

Lymph nodes: No cervical lymphadenopathy. Bilateral lymph nodes are
subcentimeter, fairly symmetric and within normal limits. No
hyperenhancing, cystic, or necrotic lymph nodes identified.

No neck mass or discrete soft tissue inflammation identified.

Vascular: Suboptimal intravascular contrast bolus. Grossly patent
major vascular structures in the neck and at the skull base. No
significant calcified carotid atherosclerosis.

Limited intracranial: Negative.

Visualized orbits: Negative.

Mastoids and visualized paranasal sinuses: Small bilateral maxillary
sinus mucous retention cysts, otherwise clear.

Skeleton: Elongated bilateral stylohyoid ligament calcification.
Widespread advanced cervical spine degeneration, primarily disc and
endplate degeneration. Up to mild associated cervical spinal
stenosis. No acute osseous abnormality identified.

Upper chest: Negative visible trachea. Upper lungs are clear. Mild
mediastinal lipomatosis. No superior mediastinal mass or
lymphadenopathy.
IMPRESSION: 1. Essentially negative Neck CT; no mass or lymphadenopathy.
2. Bilateral stylohyoid ligament calcification which can predispose
to PEDE Syndrome.
3. Advanced cervical spine degeneration.

## 2021-08-05 MED ORDER — IOHEXOL 300 MG/ML  SOLN
75.0000 mL | Freq: Once | INTRAMUSCULAR | Status: AC | PRN
  Administered 2021-08-05: 75 mL via INTRAVENOUS

## 2021-08-09 ENCOUNTER — Ambulatory Visit (HOSPITAL_COMMUNITY): Payer: 59 | Admitting: Physical Therapy

## 2021-08-10 ENCOUNTER — Encounter (HOSPITAL_COMMUNITY): Payer: Self-pay | Admitting: Physical Therapy

## 2021-08-10 ENCOUNTER — Ambulatory Visit (HOSPITAL_COMMUNITY): Payer: 59 | Attending: Neurology | Admitting: Physical Therapy

## 2021-08-10 DIAGNOSIS — R29898 Other symptoms and signs involving the musculoskeletal system: Secondary | ICD-10-CM | POA: Diagnosis present

## 2021-08-10 DIAGNOSIS — R293 Abnormal posture: Secondary | ICD-10-CM | POA: Insufficient documentation

## 2021-08-10 DIAGNOSIS — M542 Cervicalgia: Secondary | ICD-10-CM | POA: Diagnosis present

## 2021-08-10 NOTE — Therapy (Signed)
OUTPATIENT PHYSICAL THERAPY CERVICAL EVALUATION   Patient Name: Micheal Ross. MRN: [id number] DOB:1960-12-15, 61 y.o., male Today's Date: 08/10/2021   PT End of Session - 08/10/21 6606     Visit Number 2    Number of Visits 8    Date for PT Re-Evaluation 08/25/21    Authorization Type [employer] (30 visits combined PTOT, ST, 0 used) (no auth unless 30 visits needed)    PT Start Time 0923    PT Stop Time 1007    PT Time Calculation (min) 44 min    Activity Tolerance Patient tolerated treatment well    Behavior During Therapy WFL for tasks assessed/performed             Past Medical History:  Diagnosis Date   Brain tumor ([place])    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)   Hypercholesterolemia    Hypertension    Neck pain    Shoulder pain    Sleep apnea    set to get tested soon 01/20/19   Past Surgical History:  Procedure Laterality Date   ANKLE SURGERY     brain tumor removal  2014   COLONOSCOPY WITH PROPOFOL N/A 01/13/2019   Procedure: COLONOSCOPY WITH PROPOFOL;  Surgeon: Daneil Dolin, MD;  20 mm polyp found in the cecum that was removed via piecemeal hot snare polypectomy. Repeat in 4 months. path tubulovillous adenoma, negative for high-grade dysplasia   COLONOSCOPY WITH PROPOFOL N/A 01/20/2019   Procedure: COLONOSCOPY WITH PROPOFOL;  Surgeon: Danie Binder, MD; large defect (2 cm x 1.5 cm with small visible vessel with stigmata of recent bleed) in the cecum.  3 hemostatic clips placed (MR conditional).  One 3 mm polyp in the cecum, tortuous colon, external and internal hemorrhoids.  Pathology revealed tubular adenoma.   HERNIA REPAIR  [id number]   umbilical   NASAL SEPTOPLASTY W/ TURBINOPLASTY Bilateral 11/30/2020   Procedure: NASAL SEPTOPLASTY WITH BILATERAL  TURBINATE REDUCTION;  Surgeon: Leta Baptist, MD;  Location: [place];  Service: ENT;  Laterality: Bilateral;   POLYPECTOMY  01/13/2019   Procedure: POLYPECTOMY;  Surgeon: Daneil Dolin, MD;   [facility];  Service: Endoscopy;;  Large Cecal Polyp    Patient Active Problem List   Diagnosis Date Noted   Carpal tunnel syndrome, left upper limb 04/28/2021   Bee sting 12/05/2019   Obstructive sleep apnea 03/31/2019   Acute blood loss anemia 01/20/2019   Tachycardia 01/20/2019   Lower GI bleed    Weakness of both lower extremities 01/15/2019   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   Rectal bleeding 01/15/2019   Gastroesophageal reflux disease 01/15/2019   Shortness of breath 01/15/2019   History of colonic polyps 11/14/2018   Hypertension 03/29/2018   Hyperlipidemia 03/29/2018   Meningioma ([place]) 09/20/2012    PCP: [name] MD  REFERRING PROVIDER: Alric Ran, MD   REFERRING DIAG: M54.12 (ICD-10-CM) - Cervical radiculopathy   THERAPY DIAG:  Cervicalgia  Abnormal posture  Other symptoms and signs involving the musculoskeletal system  Rationale for Evaluation and Treatment Rehabilitation  ONSET DATE: November 2021  SUBJECTIVE:  SUBJECTIVE STATEMENT: The blood work came back ok. They did CT but didn't tell him the results yet. Still feeling about the same. Has to have carpel tunnel surgery Monday.   PERTINENT HISTORY:  hypertension, hyperlipidemia, obesity, history of left frontal meningioma that was resected   PAIN:  Are you having pain? No and Yes: NPRS scale: 7/10 Pain location: shoulder  blade L  Pain description: sharp Aggravating factors: pressure, movement Relieving factors: ice  PRECAUTIONS: None  WEIGHT BEARING RESTRICTIONS No  FALLS:  Has patient fallen in last 6 months? Yes. Number of falls 1  LIVING ENVIRONMENT: Lives with: lives with their spouse Lives in: House/apartment Stairs: Yes: External: 6 steps; on right going up, on left  going up, and can reach both Has following equipment at home: shower chair  OCCUPATION: applied for disability  PLOF: Independent  PATIENT GOALS be able to stop pain and move better  OBJECTIVE:   DIAGNOSTIC FINDINGS:  MRI 10/22 IMPRESSION: 1. 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 2. Mild left foraminal narrowing at C2-3. 3. Moderate right and mild left foraminal narrowing at C3-4. 4. Moderate foraminal narrowing bilaterally at C4-5 and C5-6 is worse on the right. 5. Mild right foraminal narrowing at C6-7. 6. Moderate foraminal narrowing bilaterally at C7-T1 is worse on the left.  PATIENT SURVEYS:  FOTO 31% function using back scale as uploaded for neck/shoulder  Observation:slight edema in L supraclavicular region and periscap region  COGNITION: Overall cognitive status: Within functional limits for tasks assessed   SENSATION: WFL  POSTURE: rounded shoulders, forward head, and increased thoracic kyphosis  PALPATION: TTP L middle trap, rhomboids, levator scap, UT, infraspinatus, scalene; edema in supraclavicular region L>R, L posterior superior scapular region  CERVICAL ROM:   Active ROM A/PROM  eval  Flexion 25% limited*  Extension 25% limited*  Right lateral flexion 0% limited  Left lateral flexion 25% limited*  Right rotation 0% limited  Left rotation 25% limited*   (Blank rows = not tested) * pain  UPPER EXTREMITY ROM: WFL   Active ROM Right eval Left eval  Shoulder flexion    Shoulder extension    Shoulder abduction    Shoulder adduction    Shoulder extension    Shoulder internal rotation    Shoulder external rotation    Elbow flexion    Elbow extension    Wrist flexion    Wrist extension    Wrist ulnar deviation    Wrist radial deviation    Wrist pronation    Wrist supination     (Blank rows = not tested) *pain  UPPER EXTREMITY MMT:  MMT Right eval Left eval  Shoulder flexion 4+/5 4/5*  Shoulder extension     Shoulder abduction 4+/5 4/5*  Shoulder adduction    Shoulder extension    Shoulder internal rotation 5/5 5/5  Shoulder external rotation 5/5 5/5  Middle trapezius    Lower trapezius    Elbow flexion 5/5 5/5  Elbow extension 5/5 5/5  Wrist flexion    Wrist extension    Wrist ulnar deviation    Wrist radial deviation    Wrist pronation    Wrist supination    Grip strength WFL WFL   (Blank rows = not tested) *pain      TODAY'S TREATMENT:  08/10/21 Seated UT stretch 3x 30 second holds Supine cervical retractions 3x 10  Supine scap retractions 2x 10  Manual: R sidelying L scapular mobs  all directions and STM to periscap  musculature Scapular retractions with GH ER RTB 2x 10  Self STM with tennis ball to L periscap musculature  Levator scapulae stretch 2x 30 second holds L   07/28/21 Education   PATIENT EDUCATION:  Education details: 07/28/21 Patient educated on exam findings, POC, scope of PT, HEP, f/u with MD regarding supraclavicular edema, and lymphatic system. 08/10/21 HEP Person educated: Patient Education method: Consulting civil engineer, Demonstration, and Handouts Education comprehension: verbalized understanding, returned demonstration, verbal cues required, and tactile cues required   HOME EXERCISE PROGRAM: 07/28/21 begin next session 08/10/21 supine cervical retractions, self STM  ASSESSMENT:  CLINICAL IMPRESSION: Patient with c/o increased symptoms with UT stretch. Completed cervical retractions with good mechanics after initial demonstration and cueing. Completed scapular mobilizations and STM with c/o increased UT symptoms but decreased neck/periscap symptoms. Completes additional exercises with intermittent cueing PRN. Patient will continue to benefit from physical therapy in order to improve function and reduce impairment.     OBJECTIVE IMPAIRMENTS decreased activity tolerance, decreased endurance, decreased mobility, decreased ROM, decreased strength, hypomobility,  increased muscle spasms, impaired flexibility, improper body mechanics, postural dysfunction, obesity, and pain.   ACTIVITY LIMITATIONS carrying, lifting, bending, standing, squatting, sleeping, stairs, transfers, bed mobility, bathing, toileting, and reach over head  PARTICIPATION LIMITATIONS: meal prep, cleaning, laundry, driving, shopping, community activity, occupation, and yard work  PERSONAL FACTORS Fitness, Time since onset of injury/illness/exacerbation, and 3+ comorbidities: Allergies, Arthritis, Back pain, BMI over 30, Headaches, High Blood Pressure  are also affecting patient's functional outcome.   REHAB POTENTIAL: Good  CLINICAL DECISION MAKING: Evolving/moderate complexity  EVALUATION COMPLEXITY: Moderate   GOALS: Goals reviewed with patient? Yes  SHORT TERM GOALS: Target date: 08/11/2021  Patient will be independent with HEP in order to improve functional outcomes. Baseline:  Goal status: IN PROGRESS  2.  Patient will report at least 25% improvement in symptoms for improved quality of life. Baseline:  Goal status: IN PROGRESS    LONG TERM GOALS: Target date: 08/25/2021  Patient will report at least 75% improvement in symptoms for improved quality of life. Baseline:  Goal status: IN PROGRESS  2.  Patient will improve FOTO score by at least 5 points in order to indicate improved tolerance to activity. Baseline: 31% function Goal status: IN PROGRESS  3.  Patient will demonstrate at least 25% improvement in cervical ROM in all restricted planes for improved ability to move head with chores. Baseline: see AROM Goal status: IN PROGRESS  4.  Patient will be able to return to all activities unrestricted for improved ability to perform work functions and participate with family.  Baseline:  Goal status: IN PROGRESS  5.  Patient will demonstrate grade of 5/5 MMT grade in all tested musculature as evidence of improved strength to assist with lifting and dressing.   Baseline: see MMT Goal status: IN PROGRESS      PLAN: PT FREQUENCY: 2x/week  PT DURATION: 4 weeks  PLANNED INTERVENTIONS: Therapeutic exercises, Therapeutic activity, Neuromuscular re-education, Balance training, Gait training, Patient/Family education, Joint manipulation, Joint mobilization, Stair training, Orthotic/Fit training, DME instructions, Aquatic Therapy, Dry Needling, Electrical stimulation, Spinal manipulation, Spinal mobilization, Cryotherapy, Moist heat, Compression bandaging, scar mobilization, Splintting, Taping, Traction, Ultrasound, Ionotophoresis '4mg'$ /ml Dexamethasone, and Manual therapy  PLAN FOR NEXT SESSION: f/u with edema, cervical/scap mobility, levator and UT stretches. Postural strength, shoulder strength, trial DN once cleaned with neck edema   Vianne Bulls Maxey Ransom, PT 08/10/2021, 10:09 AM

## 2021-08-12 ENCOUNTER — Ambulatory Visit (HOSPITAL_COMMUNITY): Payer: 59 | Attending: Neurology

## 2021-08-12 DIAGNOSIS — R29898 Other symptoms and signs involving the musculoskeletal system: Secondary | ICD-10-CM

## 2021-08-12 DIAGNOSIS — M542 Cervicalgia: Secondary | ICD-10-CM

## 2021-08-12 DIAGNOSIS — R293 Abnormal posture: Secondary | ICD-10-CM | POA: Diagnosis not present

## 2021-08-12 DIAGNOSIS — M25511 Pain in right shoulder: Secondary | ICD-10-CM | POA: Diagnosis not present

## 2021-08-12 DIAGNOSIS — M5412 Radiculopathy, cervical region: Secondary | ICD-10-CM | POA: Diagnosis present

## 2021-08-12 NOTE — Therapy (Signed)
OUTPATIENT PHYSICAL THERAPY CERVICAL EVALUATION   Patient Name: Micheal Ross. MRN: [id number] DOB:10/30/1960, 61 y.o., male Today's Date: 08/12/2021   PT End of Session - 08/12/21 1206     Visit Number 3    Number of Visits 8    Date for PT Re-Evaluation 08/25/21    Authorization Type [employer] (30 visits combined PTOT, ST, 0 used) (no auth unless 30 visits needed)    PT Start Time 1205    PT Stop Time 1250    PT Time Calculation (min) 45 min    Activity Tolerance Patient tolerated treatment well    Behavior During Therapy WFL for tasks assessed/performed              Past Medical History:  Diagnosis Date   Brain tumor ([place])    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)   Hypercholesterolemia    Hypertension    Neck pain    Shoulder pain    Sleep apnea    set to get tested soon 01/20/19   Past Surgical History:  Procedure Laterality Date   ANKLE SURGERY     brain tumor removal  2014   COLONOSCOPY WITH PROPOFOL N/A 01/13/2019   Procedure: COLONOSCOPY WITH PROPOFOL;  Surgeon: Daneil Dolin, MD;  20 mm polyp found in the cecum that was removed via piecemeal hot snare polypectomy. Repeat in 4 months. path tubulovillous adenoma, negative for high-grade dysplasia   COLONOSCOPY WITH PROPOFOL N/A 01/20/2019   Procedure: COLONOSCOPY WITH PROPOFOL;  Surgeon: Danie Binder, MD; large defect (2 cm x 1.5 cm with small visible vessel with stigmata of recent bleed) in the cecum.  3 hemostatic clips placed (MR conditional).  One 3 mm polyp in the cecum, tortuous colon, external and internal hemorrhoids.  Pathology revealed tubular adenoma.   HERNIA REPAIR  [id number]   umbilical   NASAL SEPTOPLASTY W/ TURBINOPLASTY Bilateral 11/30/2020   Procedure: NASAL SEPTOPLASTY WITH BILATERAL  TURBINATE REDUCTION;  Surgeon: Leta Baptist, MD;  Location: [place];  Service: ENT;  Laterality: Bilateral;   POLYPECTOMY  01/13/2019   Procedure: POLYPECTOMY;  Surgeon: Daneil Dolin, MD;   [facility];  Service: Endoscopy;;  Large Cecal Polyp    Patient Active Problem List   Diagnosis Date Noted   Carpal tunnel syndrome, left upper limb 04/28/2021   Bee sting 12/05/2019   Obstructive sleep apnea 03/31/2019   Acute blood loss anemia 01/20/2019   Tachycardia 01/20/2019   Lower GI bleed    Weakness of both lower extremities 01/15/2019   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   Rectal bleeding 01/15/2019   Gastroesophageal reflux disease 01/15/2019   Shortness of breath 01/15/2019   History of colonic polyps 11/14/2018   Hypertension 03/29/2018   Hyperlipidemia 03/29/2018   Meningioma ([place]) 09/20/2012    PCP: [name] MD  REFERRING PROVIDER: Alric Ran, MD   REFERRING DIAG: M54.12 (ICD-10-CM) - Cervical radiculopathy   THERAPY DIAG:  Cervicalgia  Abnormal posture  Neck pain  Other symptoms and signs involving the musculoskeletal system  Acute pain of right shoulder  Rationale for Evaluation and Treatment Rehabilitation  ONSET DATE: November 2021  SUBJECTIVE:  SUBJECTIVE STATEMENT: Patient having surgery for carpal tunnel on Monday. States that treatment last visit helped for about 30 min but on the ride to return home the ride itself caused his pain to return.  CT scan came back fine. See CT results below.   PERTINENT HISTORY:  hypertension, hyperlipidemia, obesity, history of left frontal meningioma that was resected   PAIN:  Are you having pain? No and Yes: NPRS scale: 8/10 Pain location: shoulder  blade L  Pain description: sharp Aggravating factors: pressure, movement Relieving factors: ice  PRECAUTIONS: None  WEIGHT BEARING RESTRICTIONS No  FALLS:  Has patient fallen in last 6 months? Yes. Number of falls 1  LIVING  ENVIRONMENT: Lives with: lives with their spouse Lives in: House/apartment Stairs: Yes: External: 6 steps; on right going up, on left going up, and can reach both Has following equipment at home: shower chair  OCCUPATION: applied for disability  PLOF: Independent  PATIENT GOALS be able to stop pain and move better  OBJECTIVE:   DIAGNOSTIC FINDINGS:  CLINICAL DATA:  61 year old male with bilateral neck swelling left greater than right. Symptoms reportedly for 1.5 years. Nonpainful. Dizziness.   EXAM: CT NECK WITH CONTRAST   TECHNIQUE: Multidetector CT imaging of the neck was performed using the standard protocol following the bolus administration of intravenous contrast.   RADIATION DOSE REDUCTION: This exam was performed according to the departmental dose-optimization program which includes automated exposure control, adjustment of the mA and/or kV according to patient size and/or use of iterative reconstruction technique.   CONTRAST:  26m OMNIPAQUE IOHEXOL 300 MG/ML  SOLN   COMPARISON:  Head CT 09/05/2020.   FINDINGS: Pharynx and larynx: Mild motion artifact, including at the glottis and supraglottic larynx. Mild postinflammatory calcifications of the palatine tonsils. Overall the pharyngeal and laryngeal soft tissue contours are within normal limits. Parapharyngeal and retropharyngeal spaces appear negative.   Salivary glands: Negative.  Negative sublingual space.   Thyroid: Negative.   Lymph nodes: No cervical lymphadenopathy. Bilateral lymph nodes are subcentimeter, fairly symmetric and within normal limits. No hyperenhancing, cystic, or necrotic lymph nodes identified.   No neck mass or discrete soft tissue inflammation identified.   Vascular: Suboptimal intravascular contrast bolus. Grossly patent major vascular structures in the neck and at the skull base. No significant calcified carotid atherosclerosis.   Limited intracranial: Negative.    Visualized orbits: Negative.   Mastoids and visualized paranasal sinuses: Small bilateral maxillary sinus mucous retention cysts, otherwise clear.   Skeleton: Elongated bilateral stylohyoid ligament calcification. Widespread advanced cervical spine degeneration, primarily disc and endplate degeneration. Up to mild associated cervical spinal stenosis. No acute osseous abnormality identified.   Upper chest: Negative visible trachea. Upper lungs are clear. Mild mediastinal lipomatosis. No superior mediastinal mass or lymphadenopathy.   IMPRESSION: 1. Essentially negative Neck CT; no mass or lymphadenopathy. 2. 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 3. Advanced cervical spine degeneration.     Electronically Signed   By: HGenevie AnnM.D.   On: 08/08/2021 06:43     MRI 10/22 IMPRESSION: 1. 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 2. Mild left foraminal narrowing at C2-3. 3. Moderate right and mild left foraminal narrowing at C3-4. 4. Moderate foraminal narrowing bilaterally at C4-5 and C5-6 is worse on the right. 5. Mild right foraminal narrowing at C6-7. 6. Moderate foraminal narrowing bilaterally at C7-T1 is worse on the left.  PATIENT SURVEYS:  FOTO 31% function using back scale as uploaded for  neck/shoulder  Observation:slight edema in L supraclavicular region and periscap region  COGNITION: Overall cognitive status: Within functional limits for tasks assessed   SENSATION: WFL  POSTURE: rounded shoulders, forward head, and increased thoracic kyphosis  PALPATION: TTP L middle trap, rhomboids, levator scap, UT, infraspinatus, scalene; edema in supraclavicular region L>R, L posterior superior scapular region  CERVICAL ROM:   Active ROM A/PROM  eval  Flexion 25% limited*  Extension 25% limited*  Right lateral flexion 0% limited  Left lateral flexion 25% limited*  Right rotation 0% limited   Left rotation 25% limited*   (Blank rows = not tested) * pain  UPPER EXTREMITY ROM: WFL   Active ROM Right eval Left eval  Shoulder flexion    Shoulder extension    Shoulder abduction    Shoulder adduction    Shoulder extension    Shoulder internal rotation    Shoulder external rotation    Elbow flexion    Elbow extension    Wrist flexion    Wrist extension    Wrist ulnar deviation    Wrist radial deviation    Wrist pronation    Wrist supination     (Blank rows = not tested) *pain  UPPER EXTREMITY MMT:  MMT Right eval Left eval  Shoulder flexion 4+/5 4/5*  Shoulder extension    Shoulder abduction 4+/5 4/5*  Shoulder adduction    Shoulder extension    Shoulder internal rotation 5/5 5/5  Shoulder external rotation 5/5 5/5  Middle trapezius    Lower trapezius    Elbow flexion 5/5 5/5  Elbow extension 5/5 5/5  Wrist flexion    Wrist extension    Wrist ulnar deviation    Wrist radial deviation    Wrist pronation    Wrist supination    Grip strength WFL WFL   (Blank rows = not tested) *pain      TODAY'S TREATMENT:   08/12/2021 Seated cervical retractions Supine cervical retractions 2 x 10 Supine cervical retractions with overpressure  x 10 Supine cervical retraction with right rotation x 10  Trial of manual traction 5" hold x 5 reps Trial of manual traction with towel slight flexion pull 5 x 5"  08/10/21 Seated UT stretch 3x 30 second holds Supine cervical retractions 3x 10  Supine scap retractions 2x 10  Manual: R sidelying L scapular mobs  all directions and STM to periscap musculature Scapular retractions with GH ER RTB 2x 10  Self STM with tennis ball to L periscap musculature  Levator scapulae stretch 2x 30 second holds L   07/28/21 Education   PATIENT EDUCATION:  Education details: 07/28/21 Patient educated on exam findings, POC, scope of PT, HEP, f/u with MD regarding supraclavicular edema, and lymphatic system. 08/10/21 HEP Person educated:  Patient Education method: Consulting civil engineer, Demonstration, and Handouts Education comprehension: verbalized understanding, returned demonstration, verbal cues required, and tactile cues required   HOME EXERCISE PROGRAM: 07/28/21 begin next session 08/10/21 supine cervical retractions, self STM  ASSESSMENT:  CLINICAL IMPRESSION: Patient with little change in symptoms today. Trial of retractions in sitting not improvement; trail of supine retractions no improvement with pain but some improvement with left rotation. Supine retractions with overpressure and retractions with rotation some mild ROM improvement but no pain reduction.  Trial of traction with pain going away completely but returning when stretch is released.  Talked to patient about home traction. He will try as he will be on hold for a while with carpal tunnel surgery.   Continued  swelling noted neck and upper trap and posterior shoulder. Patient will continue to benefit from physical therapy in order to improve function and reduce impairment. Trial of cervical retractions    OBJECTIVE IMPAIRMENTS decreased activity tolerance, decreased endurance, decreased mobility, decreased ROM, decreased strength, hypomobility, increased muscle spasms, impaired flexibility, improper body mechanics, postural dysfunction, obesity, and pain.   ACTIVITY LIMITATIONS carrying, lifting, bending, standing, squatting, sleeping, stairs, transfers, bed mobility, bathing, toileting, and reach over head  PARTICIPATION LIMITATIONS: meal prep, cleaning, laundry, driving, shopping, community activity, occupation, and yard work  PERSONAL FACTORS Fitness, Time since onset of injury/illness/exacerbation, and 3+ comorbidities: Allergies, Arthritis, Back pain, BMI over 30, Headaches, High Blood Pressure  are also affecting patient's functional outcome.   REHAB POTENTIAL: Good  CLINICAL DECISION MAKING: Evolving/moderate complexity  EVALUATION COMPLEXITY:  Moderate   GOALS: Goals reviewed with patient? Yes  SHORT TERM GOALS: Target date: 08/11/2021  Patient will be independent with HEP in order to improve functional outcomes. Baseline:  Goal status: IN PROGRESS  2.  Patient will report at least 25% improvement in symptoms for improved quality of life. Baseline:  Goal status: IN PROGRESS    LONG TERM GOALS: Target date: 08/25/2021  Patient will report at least 75% improvement in symptoms for improved quality of life. Baseline:  Goal status: IN PROGRESS  2.  Patient will improve FOTO score by at least 5 points in order to indicate improved tolerance to activity. Baseline: 31% function Goal status: IN PROGRESS  3.  Patient will demonstrate at least 25% improvement in cervical ROM in all restricted planes for improved ability to move head with chores. Baseline: see AROM Goal status: IN PROGRESS  4.  Patient will be able to return to all activities unrestricted for improved ability to perform work functions and participate with family.  Baseline:  Goal status: IN PROGRESS  5.  Patient will demonstrate grade of 5/5 MMT grade in all tested musculature as evidence of improved strength to assist with lifting and dressing.  Baseline: see MMT Goal status: IN PROGRESS      PLAN: PT FREQUENCY: 2x/week  PT DURATION: 4 weeks  PLANNED INTERVENTIONS: Therapeutic exercises, Therapeutic activity, Neuromuscular re-education, Balance training, Gait training, Patient/Family education, Joint manipulation, Joint mobilization, Stair training, Orthotic/Fit training, DME instructions, Aquatic Therapy, Dry Needling, Electrical stimulation, Spinal manipulation, Spinal mobilization, Cryotherapy, Moist heat, Compression bandaging, scar mobilization, Splintting, Taping, Traction, Ultrasound, Ionotophoresis '4mg'$ /ml Dexamethasone, and Manual therapy  PLAN FOR NEXT SESSION:  try cervical traction; cervical/scap mobility, levator and UT stretches. Postural  strength, shoulder strength, trial DN once cleaned with neck edema Carpal tunnel surgery Monday per Dr. Lorin Mercy; will hold therapy until cleared from surgery to return.    1:03 PM, 08/12/21 Analaura Messler Small Blessing Ozga MPT [facility] [place] [phone]

## 2021-08-12 NOTE — Anesthesia Preprocedure Evaluation (Addendum)
Anesthesia Evaluation  Patient identified by MRN, date of birth, ID band Patient awake    Reviewed: Allergy & Precautions, NPO status , Patient's Chart, lab work & pertinent test results, reviewed documented beta blocker date and time   History of Anesthesia Complications Negative for: history of anesthetic complications  Airway Mallampati: II  TM Distance: >3 FB Neck ROM: Full    Dental  (+) Partial Upper, Missing,    Pulmonary sleep apnea , former smoker,    Pulmonary exam normal        Cardiovascular hypertension, Pt. on medications and Pt. on home beta blockers Normal cardiovascular exam     Neuro/Psych Meningioma s/p resection 2014    GI/Hepatic Neg liver ROS, GERD  Controlled,  Endo/Other  Morbid obesity  Renal/GU negative Renal ROS  negative genitourinary   Musculoskeletal negative musculoskeletal ROS (+)   Abdominal   Peds  Hematology negative hematology ROS (+)   Anesthesia Other Findings Day of surgery medications reviewed with patient.  Reproductive/Obstetrics negative OB ROS                            Anesthesia Physical Anesthesia Plan  ASA: 3  Anesthesia Plan: MAC   Post-op Pain Management: Tylenol PO (pre-op)*   Induction:   PONV Risk Score and Plan: 1 and Treatment may vary due to age or medical condition, Propofol infusion, Ondansetron and Midazolam  Airway Management Planned: Simple Face Mask and Natural Airway  Additional Equipment: None  Intra-op Plan:   Post-operative Plan:   Informed Consent: I have reviewed the patients History and Physical, chart, labs and discussed the procedure including the risks, benefits and alternatives for the proposed anesthesia with the patient or authorized representative who has indicated his/her understanding and acceptance.       Plan Discussed with: CRNA  Anesthesia Plan Comments:        Anesthesia Quick  Evaluation

## 2021-08-12 NOTE — Progress Notes (Signed)
Sent text reminding pt to come in for PCR swab and to pick up pre surgery drink.

## 2021-08-14 NOTE — H&P (Signed)
Micheal Ross. is an 61 y.o. male.   Chief Complaint: left carpal tunnel syndrome HPI: 61 year old male with left carpal tunnel syndrome positive electrical test moderate left carpal tunnel and mild right carpal tunnel.  Patient additionally has some foraminal narrowing cervical spine C2-C7 without severe compression.  He is waking up at night he is he is splinting has been followed since February with persistent symptoms.  He presents for left carpal tunnel release.  Past Medical History:  Diagnosis Date   Brain tumor [facility])    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)   Hypercholesterolemia    Hypertension    Neck pain    Shoulder pain    Sleep apnea    set to get tested soon 01/20/19    Past Surgical History:  Procedure Laterality Date   ANKLE SURGERY     brain tumor removal  2014   COLONOSCOPY WITH PROPOFOL N/A 01/13/2019   Procedure: COLONOSCOPY WITH PROPOFOL;  Surgeon: Daneil Dolin, MD;  20 mm polyp found in the cecum that was removed via piecemeal hot snare polypectomy. Repeat in 4 months. path tubulovillous adenoma, negative for high-grade dysplasia   COLONOSCOPY WITH PROPOFOL N/A 01/20/2019   Procedure: COLONOSCOPY WITH PROPOFOL;  Surgeon: Danie Binder, MD; large defect (2 cm x 1.5 cm with small visible vessel with stigmata of recent bleed) in the cecum.  3 hemostatic clips placed (MR conditional).  One 3 mm polyp in the cecum, tortuous colon, external and internal hemorrhoids.  Pathology revealed tubular adenoma.   HERNIA REPAIR  [id number]   umbilical   NASAL SEPTOPLASTY W/ TURBINOPLASTY Bilateral 11/30/2020   Procedure: NASAL SEPTOPLASTY WITH BILATERAL  TURBINATE REDUCTION;  Surgeon: Leta Baptist, MD;  Location: [place];  Service: ENT;  Laterality: Bilateral;   POLYPECTOMY  01/13/2019   Procedure: POLYPECTOMY;  Surgeon: Daneil Dolin, MD;  [facility];  Service: Endoscopy;;  Large Cecal Polyp     Family History  Problem Relation Age of Onset   Thyroid  disease Mother    COPD Father    Cancer Maternal Aunt    Cancer Maternal Uncle        back   Cancer Paternal Uncle        throat    Cancer Cousin        prostate   Colon cancer Neg Hx    Colon polyps Neg Hx    Social History:  reports that he quit smoking about 38 years ago. His smoking use included cigarettes. He has a 1.00 pack-year smoking history. He has never used smokeless tobacco. He reports that he does not currently use alcohol. He reports that he does not use drugs.  Allergies:  Allergies  Allergen Reactions   Pollen Extract     No medications prior to admission.    No results found for this or any previous visit (from the past 48 hour(s)). No results found.  Review of Systems  Height '5\' 10"'$  (1.778 m), weight 131.5 kg. Physical Exam Constitutional:      Appearance: Normal appearance.  HENT:     Head: Normocephalic.     Right Ear: External ear normal.     Left Ear: External ear normal.     Nose: Nose normal.     Mouth/Throat:     Mouth: Mucous membranes are moist.  Eyes:     Extraocular Movements: Extraocular movements intact.  Cardiovascular:     Rate and Rhythm: Normal rate.  Pulmonary:  Effort: Pulmonary effort is normal.  Abdominal:     Palpations: Abdomen is soft.  Skin:    General: Skin is warm and dry.     Capillary Refill: Capillary refill takes less than 2 seconds.  Neurological:     Mental Status: He is alert.  Psychiatric:        Mood and Affect: Mood normal.        Behavior: Behavior normal.   Brachial plexus tenderness more on the left than right.  Positive Phalen's positive Tinel's left carpal canal.  Mild thenar weakness on the left normal on the right.  Decreased two-point left median nerve distribution.  Previous electrical test done by Dr.[place]quah showed bilateral left cervical radiculopathy involving C8-T1 on the left side the patient has no symptoms and ulnar side of his hand.  Moderate carpal tunnel on the left mild carpal  tunnel on the right ulnar nerve was normal and this test was done 04/21/2021.  Assessment/Plan Plan left carpal tunnel release.  Procedure discussed questions were elicited and answered he understands and agrees to proceed.  Marybelle Killings, MD 08/14/2021, 5:21 PM

## 2021-08-15 ENCOUNTER — Ambulatory Visit (HOSPITAL_BASED_OUTPATIENT_CLINIC_OR_DEPARTMENT_OTHER)
Admission: RE | Admit: 2021-08-15 | Discharge: 2021-08-15 | Disposition: A | Discharge status: Home / Self Care | Payer: 59 | Attending: Orthopaedic Surgery | Admitting: Orthopaedic Surgery

## 2021-08-15 ENCOUNTER — Other Ambulatory Visit: Payer: Self-pay

## 2021-08-15 ENCOUNTER — Ambulatory Visit (HOSPITAL_BASED_OUTPATIENT_CLINIC_OR_DEPARTMENT_OTHER): Payer: 59 | Admitting: Anesthesiology

## 2021-08-15 ENCOUNTER — Encounter (HOSPITAL_BASED_OUTPATIENT_CLINIC_OR_DEPARTMENT_OTHER)
Admission: RE | Disposition: A | Discharge status: Home / Self Care | Payer: Self-pay | Source: Home / Self Care | Attending: Orthopaedic Surgery

## 2021-08-15 ENCOUNTER — Encounter (HOSPITAL_BASED_OUTPATIENT_CLINIC_OR_DEPARTMENT_OTHER): Payer: Self-pay | Admitting: Orthopaedic Surgery

## 2021-08-15 DIAGNOSIS — Z6841 Body Mass Index (BMI) 40.0 and over, adult: Secondary | ICD-10-CM | POA: Diagnosis not present

## 2021-08-15 DIAGNOSIS — I10 Essential (primary) hypertension: Secondary | ICD-10-CM | POA: Insufficient documentation

## 2021-08-15 DIAGNOSIS — K219 Gastro-esophageal reflux disease without esophagitis: Secondary | ICD-10-CM | POA: Insufficient documentation

## 2021-08-15 DIAGNOSIS — G5602 Carpal tunnel syndrome, left upper limb: Secondary | ICD-10-CM

## 2021-08-15 DIAGNOSIS — Z87891 Personal history of nicotine dependence: Secondary | ICD-10-CM | POA: Diagnosis not present

## 2021-08-15 DIAGNOSIS — G4730 Sleep apnea, unspecified: Secondary | ICD-10-CM | POA: Insufficient documentation

## 2021-08-15 DIAGNOSIS — G5603 Carpal tunnel syndrome, bilateral upper limbs: Secondary | ICD-10-CM | POA: Insufficient documentation

## 2021-08-15 DIAGNOSIS — Z01818 Encounter for other preprocedural examination: Secondary | ICD-10-CM

## 2021-08-15 HISTORY — PX: CARPAL TUNNEL RELEASE: SHX101

## 2021-08-15 SURGERY — CARPAL TUNNEL RELEASE
Anesthesia: Monitor Anesthesia Care | Site: Wrist | Laterality: Left

## 2021-08-15 MED ORDER — OXYCODONE HCL 5 MG/5ML PO SOLN: 5.0000 mg | Freq: Once | ORAL | Status: DC | PRN

## 2021-08-15 MED ORDER — CEFAZOLIN IN SODIUM CHLORIDE 3-0.9 GM/100ML-% IV SOLN: 3.0000 g | INTRAVENOUS | Status: DC

## 2021-08-15 MED ORDER — OXYCODONE HCL 5 MG PO TABS: 5.0000 mg | ORAL_TABLET | Freq: Once | ORAL | Status: DC | PRN

## 2021-08-15 MED ORDER — CEFAZOLIN SODIUM-DEXTROSE 2-4 GM/100ML-% IV SOLN
INTRAVENOUS | Status: AC
  Filled 2021-08-15: qty 100

## 2021-08-15 MED ORDER — ACETAMINOPHEN 500 MG PO TABS
1000.0000 mg | ORAL_TABLET | Freq: Once | ORAL | Status: AC
  Administered 2021-08-15: 1000 mg via ORAL

## 2021-08-15 MED ORDER — LIDOCAINE HCL 1 % IJ SOLN
INTRAMUSCULAR | Status: DC | PRN
  Administered 2021-08-15: 6 mL

## 2021-08-15 MED ORDER — DEXMEDETOMIDINE (PRECEDEX) IN NS 20 MCG/5ML (4 MCG/ML) IV SYRINGE
PREFILLED_SYRINGE | INTRAVENOUS | Status: DC | PRN
  Administered 2021-08-15: 8 ug via INTRAVENOUS

## 2021-08-15 MED ORDER — FENTANYL CITRATE (PF) 100 MCG/2ML IJ SOLN: 25.0000 ug | INTRAMUSCULAR | Status: DC | PRN

## 2021-08-15 MED ORDER — OXYCODONE-ACETAMINOPHEN 5-325 MG PO TABS: 1.0000 | ORAL_TABLET | ORAL | 0 refills | Status: DC | PRN

## 2021-08-15 MED ORDER — LACTATED RINGERS IV SOLN: INTRAVENOUS | Status: DC

## 2021-08-15 MED ORDER — MIDAZOLAM HCL 5 MG/5ML IJ SOLN
INTRAMUSCULAR | Status: DC | PRN
  Administered 2021-08-15: 1 mg via INTRAVENOUS

## 2021-08-15 MED ORDER — FENTANYL CITRATE (PF) 100 MCG/2ML IJ SOLN
INTRAMUSCULAR | Status: AC
  Filled 2021-08-15: qty 2

## 2021-08-15 MED ORDER — ONDANSETRON HCL 4 MG/2ML IJ SOLN
INTRAMUSCULAR | Status: DC | PRN
  Administered 2021-08-15: 4 mg via INTRAVENOUS

## 2021-08-15 MED ORDER — 0.9 % SODIUM CHLORIDE (POUR BTL) OPTIME
TOPICAL | Status: DC | PRN
  Administered 2021-08-15: 1000 mL

## 2021-08-15 MED ORDER — PROPOFOL 500 MG/50ML IV EMUL
INTRAVENOUS | Status: DC | PRN
  Administered 2021-08-15: 75 ug/kg/min via INTRAVENOUS

## 2021-08-15 MED ORDER — PROPOFOL 500 MG/50ML IV EMUL
INTRAVENOUS | Status: AC
  Filled 2021-08-15: qty 50

## 2021-08-15 MED ORDER — DEXMEDETOMIDINE (PRECEDEX) IN NS 20 MCG/5ML (4 MCG/ML) IV SYRINGE
PREFILLED_SYRINGE | INTRAVENOUS | Status: AC
  Filled 2021-08-15: qty 5

## 2021-08-15 MED ORDER — LIDOCAINE 2% (20 MG/ML) 5 ML SYRINGE
INTRAMUSCULAR | Status: AC
  Filled 2021-08-15: qty 5

## 2021-08-15 MED ORDER — FENTANYL CITRATE (PF) 100 MCG/2ML IJ SOLN
INTRAMUSCULAR | Status: DC | PRN
Start: 2021-08-15 — End: 2021-08-15
  Administered 2021-08-15: 50 ug via INTRAVENOUS

## 2021-08-15 MED ORDER — LIDOCAINE 2% (20 MG/ML) 5 ML SYRINGE
INTRAMUSCULAR | Status: DC | PRN
  Administered 2021-08-15: 50 mg via INTRAVENOUS

## 2021-08-15 MED ORDER — ACETAMINOPHEN 500 MG PO TABS
ORAL_TABLET | ORAL | Status: AC
  Filled 2021-08-15: qty 2

## 2021-08-15 MED ORDER — MIDAZOLAM HCL 2 MG/2ML IJ SOLN
INTRAMUSCULAR | Status: AC
  Filled 2021-08-15: qty 2

## 2021-08-15 SURGICAL SUPPLY — 39 items
APL SKNCLS STERI-STRIP NONHPOA (GAUZE/BANDAGES/DRESSINGS)
BENZOIN TINCTURE PRP APPL 2/3 (GAUZE/BANDAGES/DRESSINGS) IMPLANT
BLADE SURG 15 STRL LF DISP TIS (BLADE) ×1 IMPLANT
BLADE SURG 15 STRL SS (BLADE) ×2
BNDG CMPR 9X4 STRL LF SNTH (GAUZE/BANDAGES/DRESSINGS) ×1
BNDG ELASTIC 4X5.8 VLCR STR LF (GAUZE/BANDAGES/DRESSINGS) ×2 IMPLANT
BNDG ESMARK 4X9 LF (GAUZE/BANDAGES/DRESSINGS) ×1 IMPLANT
CORD BIPOLAR FORCEPS 12FT (ELECTRODE) ×2 IMPLANT
COVER BACK TABLE 60X90IN (DRAPES) ×2 IMPLANT
COVER MAYO STAND STRL (DRAPES) ×2 IMPLANT
CUFF TOURN SGL QUICK 18X4 (TOURNIQUET CUFF) ×2 IMPLANT
DRAPE EXTREMITY T 121X128X90 (DISPOSABLE) ×2 IMPLANT
DRAPE SURG 17X23 STRL (DRAPES) ×2 IMPLANT
DURAPREP 26ML APPLICATOR (WOUND CARE) ×2 IMPLANT
GAUZE SPONGE 4X4 12PLY STRL (GAUZE/BANDAGES/DRESSINGS) ×2 IMPLANT
GAUZE XEROFORM 1X8 LF (GAUZE/BANDAGES/DRESSINGS) ×2 IMPLANT
GLOVE BIO SURGEON STRL SZ7.5 (GLOVE) ×2 IMPLANT
GLOVE BIOGEL PI IND STRL 8 (GLOVE) ×1 IMPLANT
GLOVE BIOGEL PI INDICATOR 8 (GLOVE) ×1
GOWN STRL REUS W/ TWL LRG LVL3 (GOWN DISPOSABLE) ×1 IMPLANT
GOWN STRL REUS W/ TWL XL LVL3 (GOWN DISPOSABLE) ×1 IMPLANT
GOWN STRL REUS W/TWL LRG LVL3 (GOWN DISPOSABLE) ×2
GOWN STRL REUS W/TWL XL LVL3 (GOWN DISPOSABLE) ×2
LOOP VESSEL MAXI BLUE (MISCELLANEOUS) IMPLANT
NDL HYPO 25X1 1.5 SAFETY (NEEDLE) IMPLANT
NEEDLE HYPO 25X1 1.5 SAFETY (NEEDLE) ×2 IMPLANT
NS IRRIG 1000ML POUR BTL (IV SOLUTION) ×2 IMPLANT
PACK BASIN DAY SURGERY FS (CUSTOM PROCEDURE TRAY) ×2 IMPLANT
PAD CAST 3X4 CTTN HI CHSV (CAST SUPPLIES) ×1 IMPLANT
PADDING CAST ABS 4INX4YD NS (CAST SUPPLIES) ×1
PADDING CAST ABS COTTON 4X4 ST (CAST SUPPLIES) ×1 IMPLANT
PADDING CAST COTTON 3X4 STRL (CAST SUPPLIES) ×2
STOCKINETTE 4X48 STRL (DRAPES) ×2 IMPLANT
STRIP CLOSURE SKIN 1/2X4 (GAUZE/BANDAGES/DRESSINGS) IMPLANT
SUT ETHILON 3 0 PS 1 (SUTURE) ×2 IMPLANT
SYR BULB EAR ULCER 3OZ GRN STR (SYRINGE) ×2 IMPLANT
SYR CONTROL 10ML LL (SYRINGE) ×1 IMPLANT
TOWEL GREEN STERILE FF (TOWEL DISPOSABLE) ×2 IMPLANT
UNDERPAD 30X36 HEAVY ABSORB (UNDERPADS AND DIAPERS) ×2 IMPLANT

## 2021-08-15 NOTE — Transfer of Care (Signed)
Immediate Anesthesia Transfer of Care Note  Patient: Micheal Ross.  Procedure(s) Performed: LEFT CARPAL TUNNEL RELEASE (Left: Wrist)  Patient Location: PACU  Anesthesia Type:MAC  Level of Consciousness: awake, alert  and oriented  Airway & Oxygen Therapy: Patient Spontanous Breathing and Patient connected to face mask oxygen  Post-op Assessment: Report given to RN and Post -op Vital signs reviewed and stable  Post vital signs: Reviewed and stable  Last Vitals:  Vitals Value Taken Time  BP 110/76 08/15/21 0807  Temp    Pulse 73 08/15/21 0809  Resp 28 08/15/21 0809  SpO2 95 % 08/15/21 0809  Vitals shown include unvalidated device data.  Last Pain:  Vitals:   08/15/21 0641  TempSrc: Oral  PainSc: 5       Patients Stated Pain Goal: 7 (11/07/99 4996)  Complications: No notable events documented.

## 2021-08-15 NOTE — Interval H&P Note (Signed)
History and Physical Interval Note:  08/15/2021 7:25 AM  Micheal Ross.  has presented today for surgery, with the diagnosis of left carpal tunnel syndrome.  The various methods of treatment have been discussed with the patient and family. After consideration of risks, benefits and other options for treatment, the patient has consented to  Procedure(s): LEFT CARPAL TUNNEL RELEASE (Left) as a surgical intervention.  The patient's history has been reviewed, patient examined, no change in status, stable for surgery.  I have reviewed the patient's chart and labs.  Questions were answered to the patient's satisfaction.     Marybelle Killings

## 2021-08-15 NOTE — Anesthesia Postprocedure Evaluation (Signed)
Anesthesia Post Note  Patient: Micheal Ross.  Procedure(s) Performed: LEFT CARPAL TUNNEL RELEASE (Left: Wrist)     Patient location during evaluation: PACU Anesthesia Type: MAC Level of consciousness: awake and alert Pain management: pain level controlled Vital Signs Assessment: post-procedure vital signs reviewed and stable Respiratory status: spontaneous breathing, nonlabored ventilation and respiratory function stable Cardiovascular status: blood pressure returned to baseline Postop Assessment: no apparent nausea or vomiting Anesthetic complications: no   No notable events documented.  Last Vitals:  Vitals:   08/15/21 0809 08/15/21 0815  BP: 110/76 113/71  Pulse: 72 66  Resp: 19 20  Temp: 36.7 C   SpO2: 94% 97%    Last Pain:  Vitals:   08/15/21 0815  TempSrc:   PainSc: 0-No pain                 Marthenia Rolling

## 2021-08-15 NOTE — Op Note (Signed)
Pre and postop diagnosis: Left carpal tunnel syndrome.  Procedure: Left carpal tunnel release.  Surgeon: Lorin Mercy MD  Tourniquet: Forearm 250 mmHg x 6 minutes.  Anesthesia MAC +4 cc Marcaine/lidocaine one-to-one mixture.  Findings: Median nerve compression in the carpal canal.  Procedure: After induction of IV sedation forearm tourniquet over Webril prepping with DuraPrep the extremity sheets and drapes were applied timeout procedure was completed.  After timeout procedure local was applied using 25 needle.  Ancef was not indicated for prophylaxis for this procedure.  DuraPrep was used extremity sheets and drapes were applied timeout procedure completed  Incision was made based on landmarks with cardinal [employer].  Subtenons tissue was divided and bipolar cautery was used for small of venous bleeders.  Transfer carpal ligament was divided as well as palmaris brevis and median nerve was released following along the ulnar border from distal to proximal.  There was hourglass deformity of the nerve no masses in the carpal canal.  Chronic flexor tenosynovitis.  Complete release fingertip was able to be passed proximally into the distal forearm as well as distally in the palm in the arch was visualized distally in the fat of the palm.  Tourniquet was deflated hemostasis obtained with the bipolar irrigation with saline solution and interrupted 3-0 nylon skin closure.  Xeroform 4 x 4's 20 years Webril was applied.  Patient tolerated procedure well transferred recovery in stable condition.

## 2021-08-15 NOTE — Discharge Instructions (Addendum)
Keep your dressing dry and you can elevate your hand and apply ice over the palm to help with postop pain.  Pain medication has been sent to your pharmacy.  You can take shower putting a bag around your hand and taping it to see Dr. Lorin Mercy in 1 week in the [place] clinic on a Thursday.   No Tylenol before 12:45pm 08/15/21. Post Anesthesia Home Care Instructions  Activity: Get plenty of rest for the remainder of the day. A responsible individual must stay with you for 24 hours following the procedure.  For the next 24 hours, DO NOT: -Drive a car -Paediatric nurse -Drink alcoholic beverages -Take any medication unless instructed by your physician -Make any legal decisions or sign important papers.  Meals: Start with liquid foods such as gelatin or soup. Progress to regular foods as tolerated. Avoid greasy, spicy, heavy foods. If nausea and/or vomiting occur, drink only clear liquids until the nausea and/or vomiting subsides. Call your physician if vomiting continues.  Special Instructions/Symptoms: Your throat may feel dry or sore from the anesthesia or the breathing tube placed in your throat during surgery. If this causes discomfort, gargle with warm salt water. The discomfort should disappear within 24 hours.  If you had a scopolamine patch placed behind your ear for the management of post- operative nausea and/or vomiting:  1. The medication in the patch is effective for 72 hours, after which it should be removed.  Wrap patch in a tissue and discard in the trash. Wash hands thoroughly with soap and water. 2. You may remove the patch earlier than 72 hours if you experience unpleasant side effects which may include dry mouth, dizziness or visual disturbances. 3. Avoid touching the patch. Wash your hands with soap and water after contact with the patch.

## 2021-08-16 ENCOUNTER — Telehealth: Payer: Self-pay | Admitting: Orthopaedic Surgery

## 2021-08-16 ENCOUNTER — Encounter (HOSPITAL_BASED_OUTPATIENT_CLINIC_OR_DEPARTMENT_OTHER): Payer: Self-pay | Admitting: Orthopaedic Surgery

## 2021-08-16 NOTE — Telephone Encounter (Signed)
Patient called in stating he was prescribed Oxycodone after surgery and when he went to the pharmacy they told him it was not good to take the Oxycodone and the prebagalin he was already prescribed together patient wants to know what should he do please advise

## 2021-08-17 ENCOUNTER — Encounter (HOSPITAL_COMMUNITY): Payer: 59 | Admitting: Physical Therapy

## 2021-08-19 ENCOUNTER — Encounter (HOSPITAL_COMMUNITY): Payer: 59

## 2021-08-22 ENCOUNTER — Telehealth: Payer: Self-pay | Admitting: Orthopaedic Surgery

## 2021-08-22 DIAGNOSIS — Z0271 Encounter for disability determination: Secondary | ICD-10-CM

## 2021-08-22 NOTE — Telephone Encounter (Signed)
Received call from patient. He is checking if we received fax of his forms that [place] at the [place] office faxed here for him. I advised him we did not received them. He stated he will go back first thing in the morning and have her to refax. He stated he does not drive. I advised him I will call him once we receive the forms. I also called [facility] and lmvm advising patient will be coming back to ask her to refax his forms here. He also wants a copy of his records and to pick up at the [place] office. He will complete auth as well.

## 2021-08-24 ENCOUNTER — Encounter (HOSPITAL_COMMUNITY): Payer: 59 | Admitting: Physical Therapy

## 2021-08-25 ENCOUNTER — Ambulatory Visit (INDEPENDENT_AMBULATORY_CARE_PROVIDER_SITE_OTHER): Payer: 59 | Admitting: Orthopaedic Surgery

## 2021-08-25 DIAGNOSIS — G5602 Carpal tunnel syndrome, left upper limb: Secondary | ICD-10-CM

## 2021-08-25 NOTE — Progress Notes (Signed)
Post-Op Visit Note   Patient: Micheal Ross.           Date of Birth: 02/16/1961           MRN: [id number] Visit Date: 08/25/2021 PCP: [name], MD   Assessment & Plan: Postop left carpal tunnel release good result improvement in the symptoms he is having some problems with dizziness and has appoint with a neurologist shortly.  He will return for nurse visit next week for suture removal for carpal tunnel and Steri-Strip application.  He can follow-up with me 4 weeks from now.  Chief Complaint:  Chief Complaint  Patient presents with   Other    S/p left CTR   Visit Diagnoses:  1. Carpal tunnel syndrome, left upper limb     Plan: Nurse visit Tuesdays for suture removal.  He will see me in 4 weeks from today.  Follow-Up Instructions: Return in about 4 weeks (around 09/22/2021).   Orders:  No orders of the defined types were placed in this encounter.  No orders of the defined types were placed in this encounter.   Imaging: No results found.  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   Rectal bleeding 01/15/2019   Gastroesophageal reflux disease 01/15/2019   Shortness of breath 01/15/2019   History of colonic polyps 11/14/2018   Hypertension 03/29/2018   Hyperlipidemia 03/29/2018   Meningioma ([place]) 09/20/2012   Past Medical History:  Diagnosis Date   Brain tumor ([place])    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)   Hypercholesterolemia    Hypertension    Neck pain    Shoulder pain    Sleep apnea    uses bipap at home    Family History  Problem Relation Age of Onset   Thyroid disease Mother    COPD Father    Cancer Maternal Aunt    Cancer Maternal Uncle         back   Cancer Paternal Uncle        throat    Cancer Cousin        prostate   Colon cancer Neg Hx    Colon polyps Neg Hx     Past Surgical History:  Procedure Laterality Date   ANKLE SURGERY     brain tumor removal  2014   CARPAL TUNNEL RELEASE Left 08/15/2021   Procedure: LEFT CARPAL TUNNEL RELEASE;  Surgeon: Marybelle Killings, MD;  Location: [place];  Service: Orthopedics;  Laterality: Left;   COLONOSCOPY WITH PROPOFOL N/A 01/13/2019   Procedure: COLONOSCOPY WITH PROPOFOL;  Surgeon: Daneil Dolin, MD;  20 mm polyp found in the cecum that was removed via piecemeal hot snare polypectomy. Repeat in 4 months. path tubulovillous adenoma, negative for high-grade dysplasia   COLONOSCOPY WITH PROPOFOL N/A 01/20/2019   Procedure: COLONOSCOPY WITH PROPOFOL;  Surgeon: Danie Binder, MD; large defect (2 cm x 1.5 cm with small visible vessel with stigmata of recent bleed) in the cecum.  3 hemostatic clips placed (MR conditional).  One 3 mm polyp in the cecum, tortuous colon, external and internal hemorrhoids.  Pathology revealed tubular adenoma.   HERNIA REPAIR  [id number]   umbilical  NASAL SEPTOPLASTY W/ TURBINOPLASTY Bilateral 11/30/2020   Procedure: NASAL SEPTOPLASTY WITH BILATERAL  TURBINATE REDUCTION;  Surgeon: Leta Baptist, MD;  Location: [place];  Service: ENT;  Laterality: Bilateral;   POLYPECTOMY  01/13/2019   Procedure: POLYPECTOMY;  Surgeon: Daneil Dolin, MD;  [facility];  Service: Endoscopy;;  Large Cecal Polyp    Social History   Occupational History   Not on file  Tobacco Use   Smoking status: Former    Packs/day: 0.50    Years: 2.00    Total pack years: 1.00    Types: Cigarettes    Quit date: 03/30/1983    Years since quitting: 38.4   Smokeless tobacco: Never  Vaping Use   Vaping Use: Never used  Substance and Sexual Activity   Alcohol use: Not Currently    Comment: occ   Drug use: No   Sexual activity: Yes    Comment: married  for 1985, 6 grown kids

## 2021-08-26 ENCOUNTER — Encounter (HOSPITAL_COMMUNITY): Payer: 59

## 2021-09-07 ENCOUNTER — Ambulatory Visit: Payer: 59 | Admitting: Gastroenterology

## 2021-09-07 ENCOUNTER — Encounter: Payer: Self-pay | Admitting: Gastroenterology

## 2021-09-07 VITALS — BP 158/108 | HR 82 | Temp 97.8°F | Ht 70.0 in | Wt 313.8 lb

## 2021-09-07 DIAGNOSIS — Z86010 Personal history of colonic polyps: Secondary | ICD-10-CM | POA: Diagnosis not present

## 2021-09-07 NOTE — H&P (View-Only) (Signed)
GI Office Note    Referring Provider: [name], MD Primary Care Physician:  [name], MD  Primary Gastroenterologist: Elon Alas. Abbey Chatters, DO   Chief Complaint   Chief Complaint  Patient presents with   Colonoscopy     History of Present Illness   Micheal Baca. is a 61 y.o. male presenting today to reschedule colonoscopy.  Colonoscopy was planned earlier this year, history of tubulovillous adenoma in 6144 complicated by post polypectomy bleed, overdue for surveillance colonoscopy.  His procedure was postponed because of work up in progress for syncope episode. He completed ECHO, heart monitor testing. He has since seen cardiology and neurology and has been cleared for surgery.  Had carpal tunnel release last month.  Desires pursuing colonoscopy at this time although he admits to being apprehensive because of post polypectomy bleed in 2020.  He required hospitalization, hold Dr. Gala Romney CVA and nearly 6 grams.  Repeat colonoscopy revealed large defect with small visible vessel with stigmata of bleeding in the cecum, 3 hemostatic clips were placed to close the defect but defect felt to be too large to be closed with clips.  Hemospray utilized.  Next step would have been CT angiography/embolization the patient had no further bleeding.  He requests Dr. Abbey Chatters to perform his procedure.  From a GI standpoint however he seems to be doing well.  Bowel movements are regular.  No blood in the stool or melena.  No upper GI symptoms.    Medications   Current Outpatient Medications  Medication Sig Dispense Refill   acetaminophen (TYLENOL) 500 MG tablet Take 500 mg by mouth as needed.     aspirin EC 81 MG tablet Take 81 mg by mouth as needed. Swallow whole. Take 1 tablet twice a week     fluticasone (FLONASE) 50 MCG/ACT nasal spray Place 2 sprays into both nostrils daily.     IBUPROFEN PO Take 300 mg by mouth as needed.     lisinopril (ZESTRIL) 20 MG tablet Take 20 mg by mouth  daily.     metoprolol succinate (TOPROL-XL) 25 MG 24 hr tablet Take 25 mg by mouth daily.     Multiple Vitamins-Minerals (ONE-A-DAY MENS 50+ ADVANTAGE) TABS Take 1 tablet by mouth daily.     pregabalin (LYRICA) 150 MG capsule Take 1 capsule (150 mg total) by mouth 2 (two) times daily. 180 capsule 1   rosuvastatin (CRESTOR) 5 MG tablet Take 5 mg by mouth daily.     No current facility-administered medications for this visit.    Allergies   Allergies as of 09/07/2021 - Review Complete 09/07/2021  Allergen Reaction Noted   Pollen extract  01/01/2021    Past Medical History   Past Medical History:  Diagnosis Date   Brain tumor ([place])    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)   Hypercholesterolemia    Hypertension    Neck pain    Shoulder pain    Sleep apnea    uses bipap at home    Past Surgical History   Past Surgical History:  Procedure Laterality Date   ANKLE SURGERY     brain tumor removal  2014   CARPAL TUNNEL RELEASE Left 08/15/2021   Procedure: LEFT CARPAL TUNNEL RELEASE;  Surgeon: Marybelle Killings, MD;  Location: [place];  Service: Orthopedics;  Laterality: Left;   COLONOSCOPY WITH PROPOFOL N/A 01/13/2019   Procedure: COLONOSCOPY WITH PROPOFOL;  Surgeon: Daneil Dolin, MD;  20 mm polyp found in the  cecum that was removed via piecemeal hot snare polypectomy. Repeat in 4 months. path tubulovillous adenoma, negative for high-grade dysplasia   COLONOSCOPY WITH PROPOFOL N/A 01/20/2019   Procedure: COLONOSCOPY WITH PROPOFOL;  Surgeon: Danie Binder, MD; large defect (2 cm x 1.5 cm with small visible vessel with stigmata of recent bleed) in the cecum.  3 hemostatic clips placed (MR conditional).  One 3 mm polyp in the cecum, tortuous colon, external and internal hemorrhoids.  Pathology revealed tubular adenoma.   HERNIA REPAIR  4403   umbilical   NASAL SEPTOPLASTY W/ TURBINOPLASTY Bilateral 11/30/2020   Procedure: NASAL SEPTOPLASTY WITH BILATERAL  TURBINATE REDUCTION;   Surgeon: Leta Baptist, MD;  Location: [place];  Service: ENT;  Laterality: Bilateral;   POLYPECTOMY  01/13/2019   Procedure: POLYPECTOMY;  Surgeon: Daneil Dolin, MD;  [facility];  Service: Endoscopy;;  Large Cecal Polyp     Past Family History   Family History  Problem Relation Age of Onset   Thyroid disease Mother    COPD Father    Cancer Maternal Aunt    Cancer Maternal Uncle        back   Cancer Paternal Uncle        throat    Cancer Cousin        prostate   Colon cancer Neg Hx    Colon polyps Neg Hx     Past Social History   Social History   Socioeconomic History   Marital status: Married    Spouse name: Not on file   Number of children: Not on file   Years of education: Not on file   Highest education level: Not on file  Occupational History   Not on file  Tobacco Use   Smoking status: Former    Packs/day: 0.50    Years: 2.00    Total pack years: 1.00    Types: Cigarettes    Quit date: 03/30/1983    Years since quitting: 38.4   Smokeless tobacco: Never  Vaping Use   Vaping Use: Never used  Substance and Sexual Activity   Alcohol use: Not Currently    Comment: occ   Drug use: No   Sexual activity: Yes    Comment: married for 1985, 6 grown kids  Other Topics Concern   Not on file  Social History Narrative   Not on file   Social Determinants of Health   Financial Resource Strain: Not on file  Food Insecurity: Not on file  Transportation Needs: Not on file  Physical Activity: Not on file  Stress: Not on file  Social Connections: Not on file  Intimate Partner Violence: Not on file    Review of Systems   General: Negative for anorexia, weight loss, fever, chills, fatigue, weakness. Eyes: Negative for vision changes.  ENT: Negative for hoarseness, difficulty swallowing , nasal congestion. CV: Negative for chest pain, angina, palpitations, dyspnea on exertion, peripheral edema.  Respiratory: Negative for dyspnea at  rest, dyspnea on exertion, cough, sputum, wheezing.  GI: See history of present illness. GU:  Negative for dysuria, hematuria, urinary incontinence, urinary frequency, nocturnal urination.  MS: Negative for joint pain, low back pain.  Derm: Negative for rash or itching.  Recovering from carpal tunnel surgery. Neuro: Negative for weakness, abnormal sensation, seizure, frequent headaches, memory loss,  confusion.  Psych: Negative for anxiety, depression, suicidal ideation, hallucinations.  Endo: Negative for unusual weight change.  Heme: Negative for bruising or bleeding. Allergy:  Negative for rash or hives.  Physical Exam   BP (!) 158/108 (BP Location: Left Arm, Patient Position: Sitting, Cuff Size: Large)   Pulse 82   Temp 97.8 F (36.6 C) (Temporal)   Ht '5\' 10"'$  (1.778 m)   Wt (!) 313 lb 12.8 oz (142.3 kg)   SpO2 97%   BMI 45.03 kg/m    General: Well-nourished, well-developed in no acute distress.  Head: Normocephalic, atraumatic.   Eyes: Conjunctiva pink, no icterus. Mouth: Oropharyngeal mucosa moist and pink , no lesions erythema or exudate. Neck: Supple without thyromegaly, masses, or lymphadenopathy.  Lungs: Clear to auscultation bilaterally.  Heart: Regular rate and rhythm, no murmurs rubs or gallops.  Abdomen: Bowel sounds are normal, nontender, nondistended, no hepatosplenomegaly or masses,  no abdominal bruits or hernia, no rebound or guarding.   Rectal: not performed Extremities: No lower extremity edema. No clubbing or deformities.  Neuro: Alert and oriented x 4 , grossly normal neurologically.  Skin: Warm and dry, no rash or jaundice.   Psych: Alert and cooperative, normal mood and affect.  Labs   Lab Results  Component Value Date   WBC 5.8 04/06/2021   HGB 14.6 04/06/2021   HCT 40.4 04/06/2021   MCV 91.0 04/06/2021   PLT 225 04/06/2021   Lab Results  Component Value Date   CREATININE 1.30 (H) 08/05/2021   BUN 11 04/06/2021   NA 138 04/06/2021   K 4.0  04/06/2021   CL 105 04/06/2021   CO2 24 04/06/2021   Lab Results  Component Value Date   ALT 21 01/01/2021   AST 20 01/01/2021   ALKPHOS 69 01/01/2021   BILITOT 0.8 01/01/2021    Imaging Studies   No results found.  Assessment   History of adenomatous colon polyp: In 2020 he had a tubulovillous adenoma removed, 20 mm at the cecum.  Course complicated by post polypectomy bleed requiring hospitalization and repeat colonoscopy.  Patient has been apprehensive about undergoing another colonoscopy.  He presents today wanting to have the procedure completed.  Plans to complete back in January, but postponed due to work-up for syncopal episode.  He has been released by neurology and cardiology, okay to pursue colonoscopy at this time.  PLAN   Colonoscopy with Dr. Abbey Chatters. ASA 3.  I have discussed the risks, alternatives, benefits with regards to but not limited to the risk of reaction to medication, bleeding, infection, perforation and the patient is agreeable to proceed. Written consent to be obtained.    Laureen Ochs. Bobby Rumpf, [place], [facility]

## 2021-09-07 NOTE — Progress Notes (Signed)
GI Office Note    Referring Provider: [name], MD Primary Care Physician:  [name], MD  Primary Gastroenterologist: Elon Alas. Abbey Chatters, DO   Chief Complaint   Chief Complaint  Patient presents with   Colonoscopy     History of Present Illness   Micheal Ross. is a 61 y.o. male presenting today to reschedule colonoscopy.  Colonoscopy was planned earlier this year, history of tubulovillous adenoma in 4765 complicated by post polypectomy bleed, overdue for surveillance colonoscopy.  His procedure was postponed because of work up in progress for syncope episode. He completed ECHO, heart monitor testing. He has since seen cardiology and neurology and has been cleared for surgery.  Had carpal tunnel release last month.  Desires pursuing colonoscopy at this time although he admits to being apprehensive because of post polypectomy bleed in 2020.  He required hospitalization, hold Dr. Gala Romney CVA and nearly 6 grams.  Repeat colonoscopy revealed large defect with small visible vessel with stigmata of bleeding in the cecum, 3 hemostatic clips were placed to close the defect but defect felt to be too large to be closed with clips.  Hemospray utilized.  Next step would have been CT angiography/embolization the patient had no further bleeding.  He requests Dr. Abbey Chatters to perform his procedure.  From a GI standpoint however he seems to be doing well.  Bowel movements are regular.  No blood in the stool or melena.  No upper GI symptoms.    Medications   Current Outpatient Medications  Medication Sig Dispense Refill   acetaminophen (TYLENOL) 500 MG tablet Take 500 mg by mouth as needed.     aspirin EC 81 MG tablet Take 81 mg by mouth as needed. Swallow whole. Take 1 tablet twice a week     fluticasone (FLONASE) 50 MCG/ACT nasal spray Place 2 sprays into both nostrils daily.     IBUPROFEN PO Take 300 mg by mouth as needed.     lisinopril (ZESTRIL) 20 MG tablet Take 20 mg by mouth  daily.     metoprolol succinate (TOPROL-XL) 25 MG 24 hr tablet Take 25 mg by mouth daily.     Multiple Vitamins-Minerals (ONE-A-DAY MENS 50+ ADVANTAGE) TABS Take 1 tablet by mouth daily.     pregabalin (LYRICA) 150 MG capsule Take 1 capsule (150 mg total) by mouth 2 (two) times daily. 180 capsule 1   rosuvastatin (CRESTOR) 5 MG tablet Take 5 mg by mouth daily.     No current facility-administered medications for this visit.    Allergies   Allergies as of 09/07/2021 - Review Complete 09/07/2021  Allergen Reaction Noted   Pollen extract  01/01/2021    Past Medical History   Past Medical History:  Diagnosis Date   Brain tumor ([place])    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)   Hypercholesterolemia    Hypertension    Neck pain    Shoulder pain    Sleep apnea    uses bipap at home    Past Surgical History   Past Surgical History:  Procedure Laterality Date   ANKLE SURGERY     brain tumor removal  2014   CARPAL TUNNEL RELEASE Left 08/15/2021   Procedure: LEFT CARPAL TUNNEL RELEASE;  Surgeon: Marybelle Killings, MD;  Location: [place];  Service: Orthopedics;  Laterality: Left;   COLONOSCOPY WITH PROPOFOL N/A 01/13/2019   Procedure: COLONOSCOPY WITH PROPOFOL;  Surgeon: Daneil Dolin, MD;  20 mm polyp found in the  cecum that was removed via piecemeal hot snare polypectomy. Repeat in 4 months. path tubulovillous adenoma, negative for high-grade dysplasia   COLONOSCOPY WITH PROPOFOL N/A 01/20/2019   Procedure: COLONOSCOPY WITH PROPOFOL;  Surgeon: Danie Binder, MD; large defect (2 cm x 1.5 cm with small visible vessel with stigmata of recent bleed) in the cecum.  3 hemostatic clips placed (MR conditional).  One 3 mm polyp in the cecum, tortuous colon, external and internal hemorrhoids.  Pathology revealed tubular adenoma.   HERNIA REPAIR  [id number]   umbilical   NASAL SEPTOPLASTY W/ TURBINOPLASTY Bilateral 11/30/2020   Procedure: NASAL SEPTOPLASTY WITH BILATERAL  TURBINATE REDUCTION;   Surgeon: Leta Baptist, MD;  Location: [place];  Service: ENT;  Laterality: Bilateral;   POLYPECTOMY  01/13/2019   Procedure: POLYPECTOMY;  Surgeon: Daneil Dolin, MD;  [facility];  Service: Endoscopy;;  Large Cecal Polyp     Past Family History   Family History  Problem Relation Age of Onset   Thyroid disease Mother    COPD Father    Cancer Maternal Aunt    Cancer Maternal Uncle        back   Cancer Paternal Uncle        throat    Cancer Cousin        prostate   Colon cancer Neg Hx    Colon polyps Neg Hx     Past Social History   Social History   Socioeconomic History   Marital status: Married    Spouse name: Not on file   Number of children: Not on file   Years of education: Not on file   Highest education level: Not on file  Occupational History   Not on file  Tobacco Use   Smoking status: Former    Packs/day: 0.50    Years: 2.00    Total pack years: 1.00    Types: Cigarettes    Quit date: 03/30/1983    Years since quitting: 38.4   Smokeless tobacco: Never  Vaping Use   Vaping Use: Never used  Substance and Sexual Activity   Alcohol use: Not Currently    Comment: occ   Drug use: No   Sexual activity: Yes    Comment: married for 1985, 6 grown kids  Other Topics Concern   Not on file  Social History Narrative   Not on file   Social Determinants of Health   Financial Resource Strain: Not on file  Food Insecurity: Not on file  Transportation Needs: Not on file  Physical Activity: Not on file  Stress: Not on file  Social Connections: Not on file  Intimate Partner Violence: Not on file    Review of Systems   General: Negative for anorexia, weight loss, fever, chills, fatigue, weakness. Eyes: Negative for vision changes.  ENT: Negative for hoarseness, difficulty swallowing , nasal congestion. CV: Negative for chest pain, angina, palpitations, dyspnea on exertion, peripheral edema.  Respiratory: Negative for dyspnea at  rest, dyspnea on exertion, cough, sputum, wheezing.  GI: See history of present illness. GU:  Negative for dysuria, hematuria, urinary incontinence, urinary frequency, nocturnal urination.  MS: Negative for joint pain, low back pain.  Derm: Negative for rash or itching.  Recovering from carpal tunnel surgery. Neuro: Negative for weakness, abnormal sensation, seizure, frequent headaches, memory loss,  confusion.  Psych: Negative for anxiety, depression, suicidal ideation, hallucinations.  Endo: Negative for unusual weight change.  Heme: Negative for bruising or bleeding. Allergy:  Negative for rash or hives.  Physical Exam   BP (!) 158/108 (BP Location: Left Arm, Patient Position: Sitting, Cuff Size: Large)   Pulse 82   Temp 97.8 F (36.6 C) (Temporal)   Ht '5\' 10"'$  (1.778 m)   Wt (!) 313 lb 12.8 oz (142.3 kg)   SpO2 97%   BMI 45.03 kg/m    General: Well-nourished, well-developed in no acute distress.  Head: Normocephalic, atraumatic.   Eyes: Conjunctiva pink, no icterus. Mouth: Oropharyngeal mucosa moist and pink , no lesions erythema or exudate. Neck: Supple without thyromegaly, masses, or lymphadenopathy.  Lungs: Clear to auscultation bilaterally.  Heart: Regular rate and rhythm, no murmurs rubs or gallops.  Abdomen: Bowel sounds are normal, nontender, nondistended, no hepatosplenomegaly or masses,  no abdominal bruits or hernia, no rebound or guarding.   Rectal: not performed Extremities: No lower extremity edema. No clubbing or deformities.  Neuro: Alert and oriented x 4 , grossly normal neurologically.  Skin: Warm and dry, no rash or jaundice.   Psych: Alert and cooperative, normal mood and affect.  Labs   Lab Results  Component Value Date   WBC 5.8 04/06/2021   HGB 14.6 04/06/2021   HCT 40.4 04/06/2021   MCV 91.0 04/06/2021   PLT 225 04/06/2021   Lab Results  Component Value Date   CREATININE 1.30 (H) 08/05/2021   BUN 11 04/06/2021   NA 138 04/06/2021   K 4.0  04/06/2021   CL 105 04/06/2021   CO2 24 04/06/2021   Lab Results  Component Value Date   ALT 21 01/01/2021   AST 20 01/01/2021   ALKPHOS 69 01/01/2021   BILITOT 0.8 01/01/2021    Imaging Studies   No results found.  Assessment   History of adenomatous colon polyp: In 2020 he had a tubulovillous adenoma removed, 20 mm at the cecum.  Course complicated by post polypectomy bleed requiring hospitalization and repeat colonoscopy.  Patient has been apprehensive about undergoing another colonoscopy.  He presents today wanting to have the procedure completed.  Plans to complete back in January, but postponed due to work-up for syncopal episode.  He has been released by neurology and cardiology, okay to pursue colonoscopy at this time.  PLAN   Colonoscopy with Dr. Abbey Chatters. ASA 3.  I have discussed the risks, alternatives, benefits with regards to but not limited to the risk of reaction to medication, bleeding, infection, perforation and the patient is agreeable to proceed. Written consent to be obtained.    Laureen Ochs. Bobby Rumpf, [place], [facility]

## 2021-09-07 NOTE — Patient Instructions (Signed)
Colonoscopy to be scheduled. See separate instructions.  Make sure to check expiration date on your bowel prep at home. Let us know if you need a new one.

## 2021-09-08 ENCOUNTER — Encounter: Payer: Self-pay | Admitting: *Deleted

## 2021-09-08 ENCOUNTER — Ambulatory Visit (INDEPENDENT_AMBULATORY_CARE_PROVIDER_SITE_OTHER): Payer: 59 | Admitting: Orthopaedic Surgery

## 2021-09-08 ENCOUNTER — Telehealth: Payer: Self-pay | Admitting: *Deleted

## 2021-09-08 DIAGNOSIS — G5602 Carpal tunnel syndrome, left upper limb: Secondary | ICD-10-CM

## 2021-09-08 NOTE — Progress Notes (Signed)
Post-Op Visit Note   Patient: Micheal Ross.           Date of Birth: 1960-05-27           MRN: [id number] Visit Date: 09/08/2021 PCP: [name], MD   Assessment & Plan: Follow-up carpal tunnel.  Sutures removed 2 weeks portion of the incision split partially Steri-Strips were applied wound is continuing to heal.  He has noticed improvement in the sensation in his hand he had moderate carpal tunnel on the left mild on the right.  Multilevel foraminal narrowing worse on the left than right side but no central compression of the cord.  Recheck 4 weeks he is happy with the improvement in sensation to the median distribution of his left hand.  Chief Complaint:  Chief Complaint  Patient presents with   Other    08/15/21 left CTR   Visit Diagnoses:  1. Carpal tunnel syndrome, left upper limb     Plan: Return 4 weeks.  Follow-Up Instructions: Return in about 4 weeks (around 10/06/2021).   Orders:  No orders of the defined types were placed in this encounter.  No orders of the defined types were placed in this encounter.   Imaging: No results found.  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   Rectal bleeding 01/15/2019   Gastroesophageal reflux disease 01/15/2019   Shortness of breath 01/15/2019   H/O adenomatous polyp of colon 11/14/2018   Hypertension 03/29/2018   Hyperlipidemia 03/29/2018   Meningioma ([place]) 09/20/2012   Past Medical History:  Diagnosis Date   Brain tumor ([place])    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)   Hypercholesterolemia    Hypertension    Neck pain    Shoulder pain    Sleep apnea    uses bipap at home    Family History  Problem Relation Age of  Onset   Thyroid disease Mother    COPD Father    Cancer Maternal Aunt    Cancer Maternal Uncle        back   Cancer Paternal Uncle        throat    Cancer Cousin        prostate   Colon cancer Neg Hx    Colon polyps Neg Hx     Past Surgical History:  Procedure Laterality Date   ANKLE SURGERY     brain tumor removal  2014   CARPAL TUNNEL RELEASE Left 08/15/2021   Procedure: LEFT CARPAL TUNNEL RELEASE;  Surgeon: Marybelle Killings, MD;  Location: [place];  Service: Orthopedics;  Laterality: Left;   COLONOSCOPY WITH PROPOFOL N/A 01/13/2019   Procedure: COLONOSCOPY WITH PROPOFOL;  Surgeon: Daneil Dolin, MD;  20 mm polyp found in the cecum that was removed via piecemeal hot snare polypectomy. Repeat in 4 months. path tubulovillous adenoma, negative for high-grade dysplasia   COLONOSCOPY WITH PROPOFOL N/A 01/20/2019   Procedure: COLONOSCOPY WITH PROPOFOL;  Surgeon: Danie Binder, MD; large defect (2 cm x 1.5 cm with small visible vessel with stigmata of recent bleed) in the cecum.  3 hemostatic clips placed (MR conditional).  One 3 mm polyp in the cecum, tortuous colon,  external and internal hemorrhoids.  Pathology revealed tubular adenoma.   HERNIA REPAIR  [id number]   umbilical   NASAL SEPTOPLASTY W/ TURBINOPLASTY Bilateral 11/30/2020   Procedure: NASAL SEPTOPLASTY WITH BILATERAL  TURBINATE REDUCTION;  Surgeon: Leta Baptist, MD;  Location: [place];  Service: ENT;  Laterality: Bilateral;   POLYPECTOMY  01/13/2019   Procedure: POLYPECTOMY;  Surgeon: Daneil Dolin, MD;  [facility];  Service: Endoscopy;;  Large Cecal Polyp    Social History   Occupational History   Not on file  Tobacco Use   Smoking status: Former    Packs/day: 0.50    Years: 2.00    Total pack years: 1.00    Types: Cigarettes    Quit date: 03/30/1983    Years since quitting: 38.4   Smokeless tobacco: Never  Vaping Use   Vaping Use: Never used  Substance and Sexual Activity    Alcohol use: Not Currently    Comment: occ   Drug use: No   Sexual activity: Yes    Comment: married for 1985, 6 grown kids

## 2021-09-08 NOTE — Telephone Encounter (Signed)
Called pt. Scheduled for TCS with Dr. Abbey Chatters, ASA 3 on 8/3 at 7:30am. He already has prep at home. Advised will mail new instructions/pre-op appt.

## 2021-09-13 ENCOUNTER — Ambulatory Visit (HOSPITAL_COMMUNITY): Payer: 59 | Attending: Neurology

## 2021-09-13 DIAGNOSIS — R293 Abnormal posture: Secondary | ICD-10-CM | POA: Diagnosis present

## 2021-09-13 DIAGNOSIS — R29898 Other symptoms and signs involving the musculoskeletal system: Secondary | ICD-10-CM | POA: Insufficient documentation

## 2021-09-13 DIAGNOSIS — M542 Cervicalgia: Secondary | ICD-10-CM | POA: Insufficient documentation

## 2021-09-13 DIAGNOSIS — M25511 Pain in right shoulder: Secondary | ICD-10-CM | POA: Diagnosis present

## 2021-09-13 NOTE — Therapy (Addendum)
OUTPATIENT PHYSICAL THERAPY CERVICAL PROGRESS NOTE  Progress Note Reporting Period 07/28/21 to 09/13/2021  See note below for Objective Data and Assessment of Progress/Goals.     09/13/21 1116  PT Visits / Re-Eval  Visit Number 4  Number of Visits 8  Date for PT Re-Evaluation 10/04/21  Authorization  Authorization Type [employer] (30 visits combined PTOT, ST, 0 used) (no auth unless 30 visits needed)  PT Time Calculation  PT Start Time 1116  PT Stop Time 1200  PT Time Calculation (min) 44 min  PT - End of Session  Activity Tolerance Patient tolerated treatment well  Behavior During Therapy [facility] for tasks assessed/performed       Patient Name: Micheal Ross. MRN: [id number] DOB:02-29-1961, 61 y.o., male Today's Date: 09/15/2021       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)   Hypercholesterolemia    Hypertension    Neck pain    Shoulder pain    Sleep apnea    uses bipap at home   Past Surgical History:  Procedure Laterality Date   ANKLE SURGERY     brain tumor removal  2014   CARPAL TUNNEL RELEASE Left 08/15/2021   Procedure: LEFT CARPAL TUNNEL RELEASE;  Surgeon: Eldred Manges, MD;  Location: [facility];  Service: Orthopedics;  Laterality: Left;   COLONOSCOPY WITH PROPOFOL N/A 01/13/2019   Procedure: COLONOSCOPY WITH PROPOFOL;  Surgeon: Corbin Ade, MD;  20 mm polyp found in the cecum that was removed via piecemeal hot snare polypectomy. Repeat in 4 months. path tubulovillous adenoma, negative for high-grade dysplasia   COLONOSCOPY WITH PROPOFOL N/A 01/20/2019   Procedure: COLONOSCOPY WITH PROPOFOL;  Surgeon: West Bali, MD; large defect (2 cm x 1.5 cm with small visible vessel with stigmata of recent bleed) in the cecum.  3 hemostatic clips placed (MR conditional).  One 3 mm polyp in the cecum, tortuous colon, external and internal hemorrhoids.  Pathology revealed tubular adenoma.    HERNIA REPAIR  2010   umbilical   NASAL SEPTOPLASTY W/ TURBINOPLASTY Bilateral 11/30/2020   Procedure: NASAL SEPTOPLASTY WITH BILATERAL  TURBINATE REDUCTION;  Surgeon: Newman Pies, MD;  Location: [facility];  Service: ENT;  Laterality: Bilateral;   POLYPECTOMY  01/13/2019   Procedure: POLYPECTOMY;  Surgeon: Corbin Ade, MD;  [facility];  Service: Endoscopy;;  Large Cecal Polyp    Patient Active Problem List   Diagnosis Date Noted   Carpal tunnel syndrome, left upper limb 04/28/2021   Bee sting 12/05/2019   Obstructive sleep apnea 03/31/2019   Acute blood loss anemia 01/20/2019   Tachycardia 01/20/2019   Lower GI bleed    Weakness of both lower extremities 01/15/2019   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   Rectal bleeding 01/15/2019   Gastroesophageal reflux disease 01/15/2019   Shortness of breath 01/15/2019   H/O adenomatous polyp of colon 11/14/2018   Hypertension 03/29/2018   Hyperlipidemia 03/29/2018   Meningioma (HCC) 09/20/2012    PCP: Kirstie Peri MD  REFERRING PROVIDER: Windell Norfolk, MD   REFERRING DIAG: M54.12 (ICD-10-CM) - Cervical radiculopathy   THERAPY DIAG:  Cervicalgia - Plan: PT plan of care cert/re-cert, CANCELED: PT plan of care cert/re-cert  Acute pain of right shoulder - Plan: PT plan of care cert/re-cert, CANCELED: PT plan of care cert/re-cert  Abnormal posture - Plan: PT plan of care cert/re-cert, CANCELED: PT plan of care cert/re-cert  Neck pain - Plan: PT plan of care cert/re-cert, CANCELED: PT plan of care cert/re-cert  Other symptoms and signs involving the musculoskeletal system - Plan: PT plan of care cert/re-cert, CANCELED: PT plan of care cert/re-cert  Rationale for Evaluation and Treatment Rehabilitation  ONSET DATE: November 2021  SUBJECTIVE:                                                                                                                                                                                                          SUBJECTIVE STATEMENT: Patient returns to therapy after having carpal tunnel 08/15/21; had incision open up a couple times so has had to return for sutures; no has butterfly strips. returns to [employer]. Neck Pain currently is a little better; left side neck and down into left medial side of the shoulder blade; See's MD 10/12/2021. Still gets a little dizziness and fogginess every now and then thinks is blood pressure related.  PERTINENT HISTORY:  hypertension, hyperlipidemia, obesity, history of left frontal meningioma that was resected   PAIN:  Are you having pain? No and Yes: NPRS scale: 6-7/10 Pain location: shoulder  blade L  Pain description: sharp Aggravating factors: pressure, movement Relieving factors: ice  PRECAUTIONS: None  WEIGHT BEARING RESTRICTIONS No  FALLS:  Has patient fallen in last 6 months? Yes. Number of falls 1  LIVING ENVIRONMENT: Lives with: lives with their spouse Lives in: House/apartment Stairs: Yes: External: 6 steps; on right going up, on left going up, and can reach both Has following equipment at home: shower chair  OCCUPATION: applied for disability  PLOF: Independent  PATIENT GOALS be able to stop pain and move better  OBJECTIVE:   DIAGNOSTIC FINDINGS:  CLINICAL DATA:  61 year old male with bilateral neck swelling left greater than right. Symptoms reportedly for 1.5 years. Nonpainful. Dizziness.   EXAM: CT NECK WITH CONTRAST   TECHNIQUE: Multidetector CT imaging of the neck was performed using the standard protocol following the bolus administration of intravenous contrast.   RADIATION DOSE REDUCTION: This exam was performed according to the departmental dose-optimization program which includes automated exposure control, adjustment of the mA and/or kV according to patient size and/or use of iterative reconstruction technique.   CONTRAST:  75mL OMNIPAQUE IOHEXOL 300 MG/ML  SOLN   COMPARISON:  Head  CT 09/05/2020.   FINDINGS: Pharynx and larynx: Mild motion artifact, including at the glottis and supraglottic larynx. Mild postinflammatory calcifications of the palatine tonsils. Overall the pharyngeal and laryngeal soft tissue contours are within normal limits. Parapharyngeal and retropharyngeal spaces appear negative.  Salivary glands: Negative.  Negative sublingual space.   Thyroid: Negative.   Lymph nodes: No cervical lymphadenopathy. Bilateral lymph nodes are subcentimeter, fairly symmetric and within normal limits. No hyperenhancing, cystic, or necrotic lymph nodes identified.   No neck mass or discrete soft tissue inflammation identified.   Vascular: Suboptimal intravascular contrast bolus. Grossly patent major vascular structures in the neck and at the skull base. No significant calcified carotid atherosclerosis.   Limited intracranial: Negative.   Visualized orbits: Negative.   Mastoids and visualized paranasal sinuses: Small bilateral maxillary sinus mucous retention cysts, otherwise clear.   Skeleton: Elongated bilateral stylohyoid ligament calcification. Widespread advanced cervical spine degeneration, primarily disc and endplate degeneration. Up to mild associated cervical spinal stenosis. No acute osseous abnormality identified.   Upper chest: Negative visible trachea. Upper lungs are clear. Mild mediastinal lipomatosis. No superior mediastinal mass or lymphadenopathy.   IMPRESSION: 1. Essentially negative Neck CT; no mass or lymphadenopathy. 2. 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 3. Advanced cervical spine degeneration.     Electronically Signed   By: Odessa Fleming M.D.   On: 08/08/2021 06:43     MRI 10/22 IMPRESSION: 1. 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 2. Mild left foraminal narrowing at C2-3. 3. Moderate right and mild left foraminal narrowing at C3-4. 4. Moderate  foraminal narrowing bilaterally at C4-5 and C5-6 is worse on the right. 5. Mild right foraminal narrowing at C6-7. 6. Moderate foraminal narrowing bilaterally at C7-T1 is worse on the left.  PATIENT SURVEYS:  FOTO 31% function using back scale as uploaded for neck/shoulder  Observation:slight edema in L supraclavicular region and periscap region  COGNITION: Overall cognitive status: Within functional limits for tasks assessed   SENSATION: WFL  POSTURE: rounded shoulders, forward head, and increased thoracic kyphosis  PALPATION: TTP L middle trap, rhomboids, levator scap, UT, infraspinatus, scalene; edema in supraclavicular region L>R, L posterior superior scapular region  CERVICAL ROM:   Active ROM AROM  eval AROM 09/13/21  Flexion 25% limited* full  Extension 25% limited* Limited 25%  Right lateral flexion 0% limited 30% limited  Left lateral flexion 25% limited* *25% limited  Right rotation 0% limited 25% limited  Left rotation 25% limited* *50% limited   (Blank rows = not tested) * pain  UPPER EXTREMITY ROM: WFL   Active ROM Right eval Left eval  Shoulder flexion    Shoulder extension    Shoulder abduction    Shoulder adduction    Shoulder extension    Shoulder internal rotation    Shoulder external rotation    Elbow flexion    Elbow extension    Wrist flexion    Wrist extension    Wrist ulnar deviation    Wrist radial deviation    Wrist pronation    Wrist supination     (Blank rows = not tested) *pain  UPPER EXTREMITY MMT:  MMT Right eval Left eval Right 09/13/21 Left 09/13/21  Shoulder flexion 4+/5 4/5* 5 4*  Shoulder extension      Shoulder abduction 4+/5 4/5* 5 4+*  Shoulder adduction      Shoulder extension      Shoulder internal rotation 5/5 5/5    Shoulder external rotation 5/5 5/5    Middle trapezius      Lower trapezius      Elbow flexion 5/5 5/5    Elbow extension 5/5 5/5    Wrist flexion      Wrist extension  Wrist ulnar  deviation      Wrist radial deviation      Wrist pronation      Wrist supination      Grip strength WFL WFL     (Blank rows = not tested) *pain      TODAY'S TREATMENT:  09/13/2021 Progress note  FOTO 33 %  Sitting cervical retraction x 10 (increased pain down into left shoulder blade)  Supine cervical retraction 2 x 10  Supine scapular retraction 2 x 10 Supine cervical retraction with left rotation x 10  STM and Manual traction to the cervical spine x 10'   08/12/2021 Seated cervical retractions Supine cervical retractions 2 x 10 Supine cervical retractions with overpressure  x 10 Supine cervical retraction with right rotation x 10  Trial of manual traction 5" hold x 5 reps Trial of manual traction with towel slight flexion pull 5 x 5"  08/10/21 Seated UT stretch 3x 30 second holds Supine cervical retractions 3x 10  Supine scap retractions 2x 10  Manual: R sidelying L scapular mobs  all directions and STM to periscap musculature Scapular retractions with GH ER RTB 2x 10  Self STM with tennis ball to L periscap musculature  Levator scapulae stretch 2x 30 second holds L   07/28/21 Education   PATIENT EDUCATION:  Education details: 09/13/21 updated HEP 07/28/21 Patient educated on exam findings, POC, scope of PT, HEP, f/u with MD regarding supraclavicular edema, and lymphatic system. 08/10/21 HEP Person educated: Patient Education method: Explanation, Demonstration, and Handouts Education comprehension: verbalized understanding, returned demonstration, verbal cues required, and tactile cues required   HOME EXERCISE PROGRAM: Access Code: [email or web address] URL: [email or web address] Date: 09/13/2021 Prepared by: [facility]  Exercises - Supine Chin Tuck  - 1 x daily - 7 x weekly - 1 sets - 10 reps - Supine Scapular Retraction  - 1 x daily - 7 x weekly - 2 sets - 10 reps - Supine Cervical Rotation AROM on Pillow  - 1 x daily - 7 x weekly - 1 sets - 10  reps  07/28/21 begin next session 08/10/21 supine cervical retractions, self STM  ASSESSMENT:  CLINICAL IMPRESSION: Progress note today after patient returns from carpal tunnel surgery. He presents with some improvement in arm strength and slight decreased pain levels and slight improved FOTO score.  Patient continues with limited mobility of the cervical spine and pain ; weakness left arm that adversely affects his  ability to perform tasks around the home, driving, and disturbs his sleep.  Patient will benefit from continued therapy services to address deficits and promote optimal function.    OBJECTIVE IMPAIRMENTS decreased activity tolerance, decreased endurance, decreased mobility, decreased ROM, decreased strength, hypomobility, increased muscle spasms, impaired flexibility, improper body mechanics, postural dysfunction, obesity, and pain.   ACTIVITY LIMITATIONS carrying, lifting, bending, standing, squatting, sleeping, stairs, transfers, bed mobility, bathing, toileting, and reach over head  PARTICIPATION LIMITATIONS: meal prep, cleaning, laundry, driving, shopping, community activity, occupation, and yard work  PERSONAL FACTORS Fitness, Time since onset of injury/illness/exacerbation, and 3+ comorbidities: Allergies, Arthritis, Back pain, BMI over 30, Headaches, High Blood Pressure  are also affecting patient's functional outcome.   REHAB POTENTIAL: Good  CLINICAL DECISION MAKING: Evolving/moderate complexity  EVALUATION COMPLEXITY: Moderate   GOALS: Goals reviewed with patient? Yes  SHORT TERM GOALS: Target date: 08/11/2021  Patient will be independent with HEP in order to improve functional outcomes. Baseline:  Goal status: IN PROGRESS  2.  Patient will report at least  25% improvement in symptoms for improved quality of life. Baseline:  Goal status: IN PROGRESS    LONG TERM GOALS: Target date: 10/04/21  Patient will report at least 75% improvement in symptoms for  improved quality of life. Baseline:  Goal status: IN PROGRESS  2.  Patient will improve FOTO score by at least 5 points in order to indicate improved tolerance to activity. Baseline: 31% function; 09/12/21 33% Goal status: IN PROGRESS  3.  Patient will demonstrate at least 25% improvement in cervical ROM in all restricted planes for improved ability to move head with chores. Baseline: see AROM Goal status: IN PROGRESS  4.  Patient will be able to return to all activities unrestricted for improved ability to perform work functions and participate with family.  Baseline:  Goal status: IN PROGRESS  5.  Patient will demonstrate grade of 5/5 MMT grade in all tested musculature as evidence of improved strength to assist with lifting and dressing.  Baseline: see MMT Goal status: IN PROGRESS      PLAN: PT FREQUENCY: 2x/week  PT DURATION: 3 weeks  PLANNED INTERVENTIONS: Therapeutic exercises, Therapeutic activity, Neuromuscular re-education, Balance training, Gait training, Patient/Family education, Joint manipulation, Joint mobilization, Stair training, Orthotic/Fit training, DME instructions, Aquatic Therapy, Dry Needling, Electrical stimulation, Spinal manipulation, Spinal mobilization, Cryotherapy, Moist heat, Compression bandaging, scar mobilization, Splintting, Taping, Traction, Ultrasound, Ionotophoresis 4mg /ml Dexamethasone, and Manual therapy  PLAN FOR NEXT SESSION:   cervical/scap mobility, levator and UT stretches. Postural strength, shoulder strength, trial DN once cleared with neck edema?  8:35 AM, 09/15/21 Braydan Marriott Small Kellin Bartling MPT [facility] [place] [phone]

## 2021-09-15 ENCOUNTER — Ambulatory Visit (HOSPITAL_COMMUNITY): Payer: 59 | Admitting: Physical Therapy

## 2021-09-15 ENCOUNTER — Encounter (HOSPITAL_COMMUNITY): Payer: Self-pay | Admitting: Physical Therapy

## 2021-09-15 DIAGNOSIS — R29898 Other symptoms and signs involving the musculoskeletal system: Secondary | ICD-10-CM

## 2021-09-15 DIAGNOSIS — R293 Abnormal posture: Secondary | ICD-10-CM

## 2021-09-15 DIAGNOSIS — M542 Cervicalgia: Secondary | ICD-10-CM

## 2021-09-15 NOTE — Addendum Note (Signed)
Addended byKarn Cassis S on: 09/15/2021 08:36 AM   Modules accepted: Orders

## 2021-09-15 NOTE — Therapy (Signed)
OUTPATIENT PHYSICAL THERAPY CERVICAL PROGRESS NOTE  Progress Note Reporting Period 07/28/21 to 09/13/2021  See note below for Objective Data and Assessment of Progress/Goals.     09/13/21 1116  PT Visits / Re-Eval  Visit Number 4  Number of Visits 8  Date for PT Re-Evaluation 10/04/21  Authorization  Authorization Type [employer] (30 visits combined PTOT, ST, 0 used) (no auth unless 30 visits needed)  PT Time Calculation  PT Start Time 1116  PT Stop Time 1200  PT Time Calculation (min) 44 min  PT - End of Session  Activity Tolerance Patient tolerated treatment well  Behavior During Therapy [facility] for tasks assessed/performed       Patient Name: Johnedward Brodrick. MRN: [id number] DOB:1960-08-15, 61 y.o., male Today's Date: 09/15/2021   PT End of Session - 09/15/21 1118     Visit Number 5    Number of Visits 14    Date for PT Re-Evaluation 10/04/21    Authorization Type [employer] (30 visits combined PTOT, ST, 0 used) (no auth unless 30 visits needed)    PT Start Time 1118    PT Stop Time 1156    PT Time Calculation (min) 38 min    Activity Tolerance Patient tolerated treatment well    Behavior During Therapy WFL for tasks assessed/performed                Past Medical History:  Diagnosis Date   Brain tumor ([place])    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)   Hypercholesterolemia    Hypertension    Neck pain    Shoulder pain    Sleep apnea    uses bipap at home   Past Surgical History:  Procedure Laterality Date   ANKLE SURGERY     brain tumor removal  2014   CARPAL TUNNEL RELEASE Left 08/15/2021   Procedure: LEFT CARPAL TUNNEL RELEASE;  Surgeon: Marybelle Killings, MD;  Location: [place];  Service: Orthopedics;  Laterality: Left;   COLONOSCOPY WITH PROPOFOL N/A 01/13/2019   Procedure: COLONOSCOPY WITH PROPOFOL;  Surgeon: Daneil Dolin, MD;  20 mm polyp found in the cecum that was removed via piecemeal hot snare polypectomy. Repeat in 4  months. path tubulovillous adenoma, negative for high-grade dysplasia   COLONOSCOPY WITH PROPOFOL N/A 01/20/2019   Procedure: COLONOSCOPY WITH PROPOFOL;  Surgeon: Danie Binder, MD; large defect (2 cm x 1.5 cm with small visible vessel with stigmata of recent bleed) in the cecum.  3 hemostatic clips placed (MR conditional).  One 3 mm polyp in the cecum, tortuous colon, external and internal hemorrhoids.  Pathology revealed tubular adenoma.   HERNIA REPAIR  [id number]   umbilical   NASAL SEPTOPLASTY W/ TURBINOPLASTY Bilateral 11/30/2020   Procedure: NASAL SEPTOPLASTY WITH BILATERAL  TURBINATE REDUCTION;  Surgeon: Leta Baptist, MD;  Location: [place];  Service: ENT;  Laterality: Bilateral;   POLYPECTOMY  01/13/2019   Procedure: POLYPECTOMY;  Surgeon: Daneil Dolin, MD;  [facility];  Service: Endoscopy;;  Large Cecal Polyp    Patient Active Problem List   Diagnosis Date Noted   Carpal tunnel syndrome, left upper limb 04/28/2021   Bee sting 12/05/2019   Obstructive sleep apnea 03/31/2019   Acute blood loss anemia 01/20/2019   Tachycardia 01/20/2019   Lower GI bleed    Weakness of both lower extremities 01/15/2019   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   Rectal bleeding 01/15/2019   Gastroesophageal reflux disease 01/15/2019  Shortness of breath 01/15/2019   H/O adenomatous polyp of colon 11/14/2018   Hypertension 03/29/2018   Hyperlipidemia 03/29/2018   Meningioma ([place]) 09/20/2012    PCP: [name] MD  REFERRING PROVIDER: Alric Ran, MD   REFERRING DIAG: [phone] (ICD-10-CM) - Cervical radiculopathy   THERAPY DIAG:  Cervicalgia  Abnormal posture  Other symptoms and signs involving the musculoskeletal system  Rationale for Evaluation and Treatment Rehabilitation  ONSET DATE: November 2021  SUBJECTIVE:                                                                                                                                                                                                          SUBJECTIVE STATEMENT: Patient states neck is getting better. He still is having pain in L shoulder blade region.  PERTINENT HISTORY:  hypertension, hyperlipidemia, obesity, history of left frontal meningioma that was resected   PAIN:  Are you having pain? No and Yes: NPRS scale: 6-7/10 Pain location: shoulder  blade L  Pain description: sharp Aggravating factors: pressure, movement Relieving factors: ice  PRECAUTIONS: None  WEIGHT BEARING RESTRICTIONS No  FALLS:  Has patient fallen in last 6 months? Yes. Number of falls 1  LIVING ENVIRONMENT: Lives with: lives with their spouse Lives in: House/apartment Stairs: Yes: External: 6 steps; on right going up, on left going up, and can reach both Has following equipment at home: shower chair  OCCUPATION: applied for disability  PLOF: Independent  PATIENT GOALS be able to stop pain and move better  OBJECTIVE:   DIAGNOSTIC FINDINGS:  CLINICAL DATA:  61 year old male with bilateral neck swelling left greater than right. Symptoms reportedly for 1.5 years. Nonpainful. Dizziness.    FINDINGS: Pharynx and larynx: Mild motion artifact, including at the glottis and supraglottic larynx. Mild postinflammatory calcifications of the palatine tonsils. Overall the pharyngeal and laryngeal soft tissue contours are within normal limits. Parapharyngeal and retropharyngeal spaces appear negative.   Salivary glands: Negative.  Negative sublingual space.   Thyroid: Negative.   Lymph nodes: No cervical lymphadenopathy. Bilateral lymph nodes are subcentimeter, fairly symmetric and within normal limits. No hyperenhancing, cystic, or necrotic lymph nodes identified.   No neck mass or discrete soft tissue inflammation identified.   Vascular: Suboptimal intravascular contrast bolus. Grossly patent major vascular structures in the neck and at the skull base. No significant calcified carotid  atherosclerosis.   Limited intracranial: Negative.   Visualized orbits: Negative.   Mastoids and visualized paranasal sinuses: Small bilateral maxillary sinus mucous retention cysts, otherwise clear.   Skeleton: Elongated bilateral stylohyoid ligament  calcification. Widespread advanced cervical spine degeneration, primarily disc and endplate degeneration. Up to mild associated cervical spinal stenosis. No acute osseous abnormality identified.   Upper chest: Negative visible trachea. Upper lungs are clear. Mild mediastinal lipomatosis. No superior mediastinal mass or lymphadenopathy.   IMPRESSION: 1. Essentially negative Neck CT; no mass or lymphadenopathy. 2. 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 3. Advanced cervical spine degeneration.  MRI 10/22 IMPRESSION: 1. 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 2. Mild left foraminal narrowing at C2-3. 3. Moderate right and mild left foraminal narrowing at C3-4. 4. Moderate foraminal narrowing bilaterally at C4-5 and C5-6 is worse on the right. 5. Mild right foraminal narrowing at C6-7. 6. Moderate foraminal narrowing bilaterally at C7-T1 is worse on the left.  PATIENT SURVEYS:  FOTO 31% function using back scale as uploaded for neck/shoulder  Observation:slight edema in L supraclavicular region and periscap region  COGNITION: Overall cognitive status: Within functional limits for tasks assessed   SENSATION: WFL  POSTURE: rounded shoulders, forward head, and increased thoracic kyphosis  PALPATION: TTP L middle trap, rhomboids, levator scap, UT, infraspinatus, scalene; edema in supraclavicular region L>R, L posterior superior scapular region  CERVICAL ROM:   Active ROM AROM  eval AROM 09/13/21  Flexion 25% limited* full  Extension 25% limited* Limited 25%  Right lateral flexion 0% limited 30% limited  Left lateral flexion 25% limited* *25% limited   Right rotation 0% limited 25% limited  Left rotation 25% limited* *50% limited   (Blank rows = not tested) * pain  UPPER EXTREMITY ROM: WFL   Active ROM Right eval Left eval  Shoulder flexion    Shoulder extension    Shoulder abduction    Shoulder adduction    Shoulder extension    Shoulder internal rotation    Shoulder external rotation    Elbow flexion    Elbow extension    Wrist flexion    Wrist extension    Wrist ulnar deviation    Wrist radial deviation    Wrist pronation    Wrist supination     (Blank rows = not tested) *pain  UPPER EXTREMITY MMT:  MMT Right eval Left eval Right 09/13/21 Left 09/13/21  Shoulder flexion 4+/5 4/5* 5 4*  Shoulder extension      Shoulder abduction 4+/5 4/5* 5 4+*  Shoulder adduction      Shoulder extension      Shoulder internal rotation 5/5 5/5    Shoulder external rotation 5/5 5/5    Middle trapezius      Lower trapezius      Elbow flexion 5/5 5/5    Elbow extension 5/5 5/5    Wrist flexion      Wrist extension      Wrist ulnar deviation      Wrist radial deviation      Wrist pronation      Wrist supination      Grip strength WFL WFL     (Blank rows = not tested) *pain      TODAY'S TREATMENT:  09/15/21 Supine scap retractions 2x 10  Supine scap ret with GH ER BTB 2x 10  Supine horizontal abduction BTB 2x 10  Seated thoracic extension 2x 10 5 second holds Manual: L UPA grade II-III T4-T6, STM to L periscap musculature, IASTM too L periscap musculature Prone horizontal abduction 2x 10    09/13/2021 Progress note  FOTO 33 %  Sitting cervical retraction x 10 (increased pain  down into left shoulder blade)  Supine cervical retraction 2 x 10  Supine scapular retraction 2 x 10 Supine cervical retraction with left rotation x 10  STM and Manual traction to the cervical spine x 10'   08/12/2021 Seated cervical retractions Supine cervical retractions 2 x 10 Supine cervical retractions with overpressure  x  10 Supine cervical retraction with right rotation x 10  Trial of manual traction 5" hold x 5 reps Trial of manual traction with towel slight flexion pull 5 x 5"  08/10/21 Seated UT stretch 3x 30 second holds Supine cervical retractions 3x 10  Supine scap retractions 2x 10  Manual: R sidelying L scapular mobs  all directions and STM to periscap musculature Scapular retractions with GH ER RTB 2x 10  Self STM with tennis ball to L periscap musculature  Levator scapulae stretch 2x 30 second holds L   07/28/21 Education   PATIENT EDUCATION:  Education details: 09/15/21 HEP 09/13/21 updated HEP 07/28/21 Patient educated on exam findings, POC, scope of PT, HEP, f/u with MD regarding supraclavicular edema, and lymphatic system. 08/10/21 HEP Person educated: Patient Education method: Consulting civil engineer, Demonstration, and Handouts Education comprehension: verbalized understanding, returned demonstration, verbal cues required, and tactile cues required   HOME EXERCISE PROGRAM: Access Code: [email or web address] Date: 09/15/2021 - Supine Shoulder External Rotation with Resistance  - 1 x daily - 7 x weekly - 3 sets - 10 reps - Prone Single Arm Shoulder Horizontal Abduction with Scapular Retraction and Palm Down  - 1 x daily - 7 x weekly - 2 sets - 10 reps  Access Code: [email or web address] Date: 09/13/2021 - Supine Chin Tuck  - 1 x daily - 7 x weekly - 1 sets - 10 reps - Supine Scapular Retraction  - 1 x daily - 7 x weekly - 2 sets - 10 reps - Supine Cervical Rotation AROM on Pillow  - 1 x daily - 7 x weekly - 1 sets - 10 reps  07/28/21 begin next session 08/10/21 supine cervical retractions, self STM  ASSESSMENT:  CLINICAL IMPRESSION: Patient with mostly periscap symptoms today. Continued with scapular mobility and periscap strengthening which is tolerated well. Patient with decreased grip strength following carpel tunnel release so had to tie band to wrist. Patient with hypomobile thoracic spine and hyperactive and tender  musculature throughout L perscap region. Decrease in symptoms following manual therapy. Patient will continue to benefit from physical therapy in order to improve function and reduce impairment.    OBJECTIVE IMPAIRMENTS decreased activity tolerance, decreased endurance, decreased mobility, decreased ROM, decreased strength, hypomobility, increased muscle spasms, impaired flexibility, improper body mechanics, postural dysfunction, obesity, and pain.   ACTIVITY LIMITATIONS carrying, lifting, bending, standing, squatting, sleeping, stairs, transfers, bed mobility, bathing, toileting, and reach over head  PARTICIPATION LIMITATIONS: meal prep, cleaning, laundry, driving, shopping, community activity, occupation, and yard work  PERSONAL FACTORS Fitness, Time since onset of injury/illness/exacerbation, and 3+ comorbidities: Allergies, Arthritis, Back pain, BMI over 30, Headaches, High Blood Pressure  are also affecting patient's functional outcome.   REHAB POTENTIAL: Good  CLINICAL DECISION MAKING: Evolving/moderate complexity  EVALUATION COMPLEXITY: Moderate   GOALS: Goals reviewed with patient? Yes  SHORT TERM GOALS: Target date: 08/11/2021  Patient will be independent with HEP in order to improve functional outcomes. Baseline:  Goal status: IN PROGRESS  2.  Patient will report at least 25% improvement in symptoms for improved quality of life. Baseline:  Goal status: IN PROGRESS    LONG TERM GOALS: Target date: 10/04/21  Patient will report at least 75% improvement in symptoms for improved quality of life. Baseline:  Goal status: IN PROGRESS  2.  Patient will improve FOTO score by at least 5 points in order to indicate improved tolerance to activity. Baseline: 31% function; 09/12/21 33% Goal status: IN PROGRESS  3.  Patient will demonstrate at least 25% improvement in cervical ROM in all restricted planes for improved ability to move head with chores. Baseline: see AROM Goal  status: IN PROGRESS  4.  Patient will be able to return to all activities unrestricted for improved ability to perform work functions and participate with family.  Baseline:  Goal status: IN PROGRESS  5.  Patient will demonstrate grade of 5/5 MMT grade in all tested musculature as evidence of improved strength to assist with lifting and dressing.  Baseline: see MMT Goal status: IN PROGRESS      PLAN: PT FREQUENCY: 2x/week  PT DURATION: 3 weeks  PLANNED INTERVENTIONS: Therapeutic exercises, Therapeutic activity, Neuromuscular re-education, Balance training, Gait training, Patient/Family education, Joint manipulation, Joint mobilization, Stair training, Orthotic/Fit training, DME instructions, Aquatic Therapy, Dry Needling, Electrical stimulation, Spinal manipulation, Spinal mobilization, Cryotherapy, Moist heat, Compression bandaging, scar mobilization, Splintting, Taping, Traction, Ultrasound, Ionotophoresis 33m/ml Dexamethasone, and Manual therapy  PLAN FOR NEXT SESSION:  periscap strength;  cervical/scap mobility, levator and UT stretches. Postural strength, shoulder strength, possibly trial DN if needed once cleared with neck edema?   11:19 AM, 09/15/21 AMearl LatinPT, DPT Physical Therap[facility]

## 2021-09-19 ENCOUNTER — Encounter (HOSPITAL_COMMUNITY): Payer: 59 | Admitting: Physical Therapy

## 2021-09-22 ENCOUNTER — Ambulatory Visit (HOSPITAL_COMMUNITY): Payer: 59 | Admitting: Physical Therapy

## 2021-09-22 ENCOUNTER — Encounter (HOSPITAL_COMMUNITY): Payer: Self-pay | Admitting: Physical Therapy

## 2021-09-22 ENCOUNTER — Encounter: Payer: 59 | Admitting: Orthopaedic Surgery

## 2021-09-22 DIAGNOSIS — M25511 Pain in right shoulder: Secondary | ICD-10-CM

## 2021-09-22 DIAGNOSIS — M542 Cervicalgia: Secondary | ICD-10-CM

## 2021-09-22 DIAGNOSIS — R293 Abnormal posture: Secondary | ICD-10-CM

## 2021-09-22 NOTE — Therapy (Signed)
OUTPATIENT PHYSICAL THERAPY CERVICAL PROGRESS NOTE     Patient Name: Micheal Ross. MRN: [id number] DOB:10-04-1960, 61 y.o., male Today's Date: 09/22/2021   PT End of Session - 09/22/21 1305     Visit Number 6    Number of Visits 14    Date for PT Re-Evaluation 10/04/21    Authorization Type [employer] (30 visits combined PTOT, ST, 0 used) (no auth unless 30 visits needed)    PT Start Time 1305    PT Stop Time 1343    PT Time Calculation (min) 38 min    Activity Tolerance Patient tolerated treatment well    Behavior During Therapy WFL for tasks assessed/performed                Past Medical History:  Diagnosis Date   Brain tumor ([place])    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)   Hypercholesterolemia    Hypertension    Neck pain    Shoulder pain    Sleep apnea    uses bipap at home   Past Surgical History:  Procedure Laterality Date   ANKLE SURGERY     brain tumor removal  2014   CARPAL TUNNEL RELEASE Left 08/15/2021   Procedure: LEFT CARPAL TUNNEL RELEASE;  Surgeon: Marybelle Killings, MD;  Location: [facility];  Service: Orthopedics;  Laterality: Left;   COLONOSCOPY WITH PROPOFOL N/A 01/13/2019   Procedure: COLONOSCOPY WITH PROPOFOL;  Surgeon: Daneil Dolin, MD;  20 mm polyp found in the cecum that was removed via piecemeal hot snare polypectomy. Repeat in 4 months. path tubulovillous adenoma, negative for high-grade dysplasia   COLONOSCOPY WITH PROPOFOL N/A 01/20/2019   Procedure: COLONOSCOPY WITH PROPOFOL;  Surgeon: Danie Binder, MD; large defect (2 cm x 1.5 cm with small visible vessel with stigmata of recent bleed) in the cecum.  3 hemostatic clips placed (MR conditional).  One 3 mm polyp in the cecum, tortuous colon, external and internal hemorrhoids.  Pathology revealed tubular adenoma.   HERNIA REPAIR  [id number]   umbilical   NASAL SEPTOPLASTY W/ TURBINOPLASTY Bilateral 11/30/2020   Procedure: NASAL SEPTOPLASTY WITH BILATERAL  TURBINATE  REDUCTION;  Surgeon: Leta Baptist, MD;  Location: [facility];  Service: ENT;  Laterality: Bilateral;   POLYPECTOMY  01/13/2019   Procedure: POLYPECTOMY;  Surgeon: Daneil Dolin, MD;  [facility];  Service: Endoscopy;;  Large Cecal Polyp    Patient Active Problem List   Diagnosis Date Noted   Carpal tunnel syndrome, left upper limb 04/28/2021   Bee sting 12/05/2019   Obstructive sleep apnea 03/31/2019   Acute blood loss anemia 01/20/2019   Tachycardia 01/20/2019   Lower GI bleed    Weakness of both lower extremities 01/15/2019   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   Rectal bleeding 01/15/2019   Gastroesophageal reflux disease 01/15/2019   Shortness of breath 01/15/2019   H/O adenomatous polyp of colon 11/14/2018   Hypertension 03/29/2018   Hyperlipidemia 03/29/2018   Meningioma ([place]) 09/20/2012    PCP: [name] MD  REFERRING PROVIDER: Alric Ran, MD   REFERRING DIAG: M54.12 (ICD-10-CM) - Cervical radiculopathy   THERAPY DIAG:  Cervicalgia  Abnormal posture  Acute pain of right shoulder  Rationale for Evaluation and Treatment Rehabilitation  ONSET DATE: November 2021  SUBJECTIVE:  SUBJECTIVE STATEMENT: Patient reports ongoing pain in center of neck and LT upper trap. He reports compliance with HEP.   PERTINENT HISTORY:  hypertension, hyperlipidemia, obesity, history of left frontal meningioma that was resected   PAIN:  Are you having pain? No and Yes: NPRS scale: 6/10 Pain location: shoulder  blade L  Pain description: sharp Aggravating factors: pressure, movement Relieving factors: ice  PRECAUTIONS: None  WEIGHT BEARING RESTRICTIONS No  FALLS:  Has patient fallen in last 6 months? Yes. Number of falls 1  LIVING ENVIRONMENT: Lives  with: lives with their spouse Lives in: House/apartment Stairs: Yes: External: 6 steps; on right going up, on left going up, and can reach both Has following equipment at home: shower chair  OCCUPATION: applied for disability  PLOF: Independent  PATIENT GOALS be able to stop pain and move better  OBJECTIVE:   DIAGNOSTIC FINDINGS:  CLINICAL DATA:  61 year old male with bilateral neck swelling left greater than right. Symptoms reportedly for 1.5 years. Nonpainful. Dizziness.    FINDINGS: Pharynx and larynx: Mild motion artifact, including at the glottis and supraglottic larynx. Mild postinflammatory calcifications of the palatine tonsils. Overall the pharyngeal and laryngeal soft tissue contours are within normal limits. Parapharyngeal and retropharyngeal spaces appear negative.   Salivary glands: Negative.  Negative sublingual space.   Thyroid: Negative.   Lymph nodes: No cervical lymphadenopathy. Bilateral lymph nodes are subcentimeter, fairly symmetric and within normal limits. No hyperenhancing, cystic, or necrotic lymph nodes identified.   No neck mass or discrete soft tissue inflammation identified.   Vascular: Suboptimal intravascular contrast bolus. Grossly patent major vascular structures in the neck and at the skull base. No significant calcified carotid atherosclerosis.   Limited intracranial: Negative.   Visualized orbits: Negative.   Mastoids and visualized paranasal sinuses: Small bilateral maxillary sinus mucous retention cysts, otherwise clear.   Skeleton: Elongated bilateral stylohyoid ligament calcification. Widespread advanced cervical spine degeneration, primarily disc and endplate degeneration. Up to mild associated cervical spinal stenosis. No acute osseous abnormality identified.   Upper chest: Negative visible trachea. Upper lungs are clear. Mild mediastinal lipomatosis. No superior mediastinal mass or lymphadenopathy.   IMPRESSION: 1.  Essentially negative Neck CT; no mass or lymphadenopathy. 2. 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 3. Advanced cervical spine degeneration.  MRI 10/22 IMPRESSION: 1. 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 2. Mild left foraminal narrowing at C2-3. 3. Moderate right and mild left foraminal narrowing at C3-4. 4. Moderate foraminal narrowing bilaterally at C4-5 and C5-6 is worse on the right. 5. Mild right foraminal narrowing at C6-7. 6. Moderate foraminal narrowing bilaterally at C7-T1 is worse on the left.  PATIENT SURVEYS:  FOTO 31% function using back scale as uploaded for neck/shoulder  Observation:slight edema in L supraclavicular region and periscap region  COGNITION: Overall cognitive status: Within functional limits for tasks assessed   SENSATION: WFL  POSTURE: rounded shoulders, forward head, and increased thoracic kyphosis  PALPATION: TTP L middle trap, rhomboids, levator scap, UT, infraspinatus, scalene; edema in supraclavicular region L>R, L posterior superior scapular region  CERVICAL ROM:   Active ROM AROM  eval AROM 09/13/21  Flexion 25% limited* full  Extension 25% limited* Limited 25%  Right lateral flexion 0% limited 30% limited  Left lateral flexion 25% limited* *25% limited  Right rotation 0% limited 25% limited  Left rotation 25% limited* *50% limited   (Blank rows = not tested) * pain  UPPER EXTREMITY ROM: WFL   Active ROM  Right eval Left eval  Shoulder flexion    Shoulder extension    Shoulder abduction    Shoulder adduction    Shoulder extension    Shoulder internal rotation    Shoulder external rotation    Elbow flexion    Elbow extension    Wrist flexion    Wrist extension    Wrist ulnar deviation    Wrist radial deviation    Wrist pronation    Wrist supination     (Blank rows = not tested) *pain  UPPER EXTREMITY MMT:  MMT Right eval Left eval  Right 09/13/21 Left 09/13/21  Shoulder flexion 4+/5 4/5* 5 4*  Shoulder extension      Shoulder abduction 4+/5 4/5* 5 4+*  Shoulder adduction      Shoulder extension      Shoulder internal rotation 5/5 5/5    Shoulder external rotation 5/5 5/5    Middle trapezius      Lower trapezius      Elbow flexion 5/5 5/5    Elbow extension 5/5 5/5    Wrist flexion      Wrist extension      Wrist ulnar deviation      Wrist radial deviation      Wrist pronation      Wrist supination      Grip strength WFL WFL     (Blank rows = not tested) *pain      TODAY'S TREATMENT:  09/22/21 Seated shin tuck (cues to avoid flexion) x10 Seated upper trap stretch 3 x 20"  Band rows BTB 3 x10 Shoulder extension BTB 3 x 10 Shoulder ER BTB 2 x 10 Shoulder IR BTB 2 x10   IASTM using thera gun Lv 10 to LT upper trap, levator, rhomboid and posterior deltoid (patient seated)     09/15/21 Supine scap retractions 2x 10  Supine scap ret with GH ER BTB 2x 10  Supine horizontal abduction BTB 2x 10  Seated thoracic extension 2x 10 5 second holds Manual: L UPA grade II-III T4-T6, STM to L periscap musculature, IASTM too L periscap musculature Prone horizontal abduction 2x 10    09/13/2021 Progress note  FOTO 33 %  Sitting cervical retraction x 10 (increased pain down into left shoulder blade)  Supine cervical retraction 2 x 10  Supine scapular retraction 2 x 10 Supine cervical retraction with left rotation x 10  STM and Manual traction to the cervical spine x 10'    PATIENT EDUCATION:  Education details: 09/15/21 HEP 09/13/21 updated HEP 07/28/21 Patient educated on exam findings, POC, scope of PT, HEP, f/u with MD regarding supraclavicular edema, and lymphatic system. 08/10/21 HEP Person educated: Patient Education method: Consulting civil engineer, Demonstration, and Handouts Education comprehension: verbalized understanding, returned demonstration, verbal cues required, and tactile cues required   HOME  EXERCISE PROGRAM: Access Code: [email or web address] 09/22/21 - Seated Upper Trapezius Stretch  - 2-3 x daily - 7 x weekly - 1 sets - 3 reps - 20-30 second hold   Date: 09/15/2021 - Supine Shoulder External Rotation with Resistance  - 1 x daily - 7 x weekly - 3 sets - 10 reps - Prone Single Arm Shoulder Horizontal Abduction with Scapular Retraction and Palm Down  - 1 x daily - 7 x weekly - 2 sets - 10 reps  Access Code: [email or web address] Date: 09/13/2021 - Supine Chin Tuck  - 1 x daily - 7 x weekly - 1 sets - 10 reps - Supine Scapular Retraction  - 1  x daily - 7 x weekly - 2 sets - 10 reps - Supine Cervical Rotation AROM on Pillow  - 1 x daily - 7 x weekly - 1 sets - 10 reps  07/28/21 begin next session 08/10/21 supine cervical retractions, self STM  ASSESSMENT:  CLINICAL IMPRESSION: Patient continues to be limited by neck pain and fascial restriction in LT periscapular region. Progressed scapular strengthening with added band rows and shoulder extension. Patient tolerated well. Notable trigger points during manual treatment. Added trap stretching to HEP for improved cervical mobility.  Patient will continue to benefit from skilled therapy services to reduce remaining deficits and improve functional ability.     OBJECTIVE IMPAIRMENTS decreased activity tolerance, decreased endurance, decreased mobility, decreased ROM, decreased strength, hypomobility, increased muscle spasms, impaired flexibility, improper body mechanics, postural dysfunction, obesity, and pain.   ACTIVITY LIMITATIONS carrying, lifting, bending, standing, squatting, sleeping, stairs, transfers, bed mobility, bathing, toileting, and reach over head  PARTICIPATION LIMITATIONS: meal prep, cleaning, laundry, driving, shopping, community activity, occupation, and yard work  PERSONAL FACTORS Fitness, Time since onset of injury/illness/exacerbation, and 3+ comorbidities: Allergies, Arthritis, Back pain, BMI over 30, Headaches, High Blood Pressure   are also affecting patient's functional outcome.   REHAB POTENTIAL: Good  CLINICAL DECISION MAKING: Evolving/moderate complexity  EVALUATION COMPLEXITY: Moderate   GOALS: Goals reviewed with patient? Yes  SHORT TERM GOALS: Target date: 08/11/2021  Patient will be independent with HEP in order to improve functional outcomes. Baseline:  Goal status: IN PROGRESS  2.  Patient will report at least 25% improvement in symptoms for improved quality of life. Baseline:  Goal status: IN PROGRESS    LONG TERM GOALS: Target date: 10/04/21  Patient will report at least 75% improvement in symptoms for improved quality of life. Baseline:  Goal status: IN PROGRESS  2.  Patient will improve FOTO score by at least 5 points in order to indicate improved tolerance to activity. Baseline: 31% function; 09/12/21 33% Goal status: IN PROGRESS  3.  Patient will demonstrate at least 25% improvement in cervical ROM in all restricted planes for improved ability to move head with chores. Baseline: see AROM Goal status: IN PROGRESS  4.  Patient will be able to return to all activities unrestricted for improved ability to perform work functions and participate with family.  Baseline:  Goal status: IN PROGRESS  5.  Patient will demonstrate grade of 5/5 MMT grade in all tested musculature as evidence of improved strength to assist with lifting and dressing.  Baseline: see MMT Goal status: IN PROGRESS      PLAN: PT FREQUENCY: 2x/week  PT DURATION: 3 weeks  PLANNED INTERVENTIONS: Therapeutic exercises, Therapeutic activity, Neuromuscular re-education, Balance training, Gait training, Patient/Family education, Joint manipulation, Joint mobilization, Stair training, Orthotic/Fit training, DME instructions, Aquatic Therapy, Dry Needling, Electrical stimulation, Spinal manipulation, Spinal mobilization, Cryotherapy, Moist heat, Compression bandaging, scar mobilization, Splintting, Taping, Traction,  Ultrasound, Ionotophoresis 40m/ml Dexamethasone, and Manual therapy  PLAN FOR NEXT SESSION:  periscap strength;  cervical/scap mobility, levator and UT stretches. Postural strength, shoulder strength, possibly trial DN if needed once cleared with neck edema?   1:06 PM, 09/22/21 CJosue HectorPT DPT  Physical Therapist with [facility] ([phone]

## 2021-09-26 ENCOUNTER — Ambulatory Visit (HOSPITAL_COMMUNITY): Payer: 59 | Admitting: Physical Therapy

## 2021-09-26 DIAGNOSIS — M542 Cervicalgia: Secondary | ICD-10-CM | POA: Diagnosis not present

## 2021-09-26 DIAGNOSIS — R293 Abnormal posture: Secondary | ICD-10-CM

## 2021-09-26 NOTE — Therapy (Signed)
OUTPATIENT PHYSICAL THERAPY CERVICAL PROGRESS NOTE     Patient Name: Micheal Ross. MRN: [id number] DOB:1960-11-28, 61 y.o., male Today's Date: 09/26/2021   PT End of Session - 09/26/21 1308     Visit Number 7    Number of Visits 14    Date for PT Re-Evaluation 10/04/21    Authorization Type [employer] (30 visits combined PTOT, ST, 0 used) (no auth unless 30 visits needed)    PT Start Time 1305    PT Stop Time 1343    PT Time Calculation (min) 38 min    Activity Tolerance Patient tolerated treatment well    Behavior During Therapy WFL for tasks assessed/performed                Past Medical History:  Diagnosis Date   Brain tumor ([place])    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)   Hypercholesterolemia    Hypertension    Neck pain    Shoulder pain    Sleep apnea    uses bipap at home   Past Surgical History:  Procedure Laterality Date   ANKLE SURGERY     brain tumor removal  2014   CARPAL TUNNEL RELEASE Left 08/15/2021   Procedure: LEFT CARPAL TUNNEL RELEASE;  Surgeon: Marybelle Killings, MD;  Location: [facility];  Service: Orthopedics;  Laterality: Left;   COLONOSCOPY WITH PROPOFOL N/A 01/13/2019   Procedure: COLONOSCOPY WITH PROPOFOL;  Surgeon: Daneil Dolin, MD;  20 mm polyp found in the cecum that was removed via piecemeal hot snare polypectomy. Repeat in 4 months. path tubulovillous adenoma, negative for high-grade dysplasia   COLONOSCOPY WITH PROPOFOL N/A 01/20/2019   Procedure: COLONOSCOPY WITH PROPOFOL;  Surgeon: Danie Binder, MD; large defect (2 cm x 1.5 cm with small visible vessel with stigmata of recent bleed) in the cecum.  3 hemostatic clips placed (MR conditional).  One 3 mm polyp in the cecum, tortuous colon, external and internal hemorrhoids.  Pathology revealed tubular adenoma.   HERNIA REPAIR  [id number]   umbilical   NASAL SEPTOPLASTY W/ TURBINOPLASTY Bilateral 11/30/2020   Procedure: NASAL SEPTOPLASTY WITH BILATERAL  TURBINATE  REDUCTION;  Surgeon: Leta Baptist, MD;  Location: [facility];  Service: ENT;  Laterality: Bilateral;   POLYPECTOMY  01/13/2019   Procedure: POLYPECTOMY;  Surgeon: Daneil Dolin, MD;  [facility];  Service: Endoscopy;;  Large Cecal Polyp    Patient Active Problem List   Diagnosis Date Noted   Carpal tunnel syndrome, left upper limb 04/28/2021   Bee sting 12/05/2019   Obstructive sleep apnea 03/31/2019   Acute blood loss anemia 01/20/2019   Tachycardia 01/20/2019   Lower GI bleed    Weakness of both lower extremities 01/15/2019   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 Patient notes that massage gun was very helpful for pain.   PERTINENT HISTORY:  hypertension, hyperlipidemia, obesity, history of left frontal meningioma that was resected   PAIN:  Are you having pain? No and Yes: NPRS scale: 5/10 Pain location: shoulder  blade L  Pain description: sharp Aggravating factors: pressure, movement Relieving factors: ice  PRECAUTIONS: None  WEIGHT BEARING RESTRICTIONS No  FALLS:  Has patient fallen in last 6 months? Yes. Number of falls 1  LIVING ENVIRONMENT: Lives with: lives with their spouse Lives in:  House/apartment Stairs: Yes: External: 6 steps; on right going up, on left going up, and can reach both Has following equipment at home: shower chair  OCCUPATION: applied for disability  PLOF: Independent  PATIENT GOALS be able to stop pain and move better  OBJECTIVE:   DIAGNOSTIC FINDINGS:  CLINICAL DATA:  61 year old male with bilateral neck swelling left greater than right. Symptoms reportedly for 1.5 years. Nonpainful. Dizziness.    FINDINGS: Pharynx and larynx: Mild motion artifact, including at the glottis and supraglottic larynx. Mild postinflammatory calcifications of the palatine tonsils. Overall the pharyngeal and laryngeal soft tissue contours are within normal limits. Parapharyngeal and retropharyngeal spaces appear negative.   Salivary glands: Negative.  Negative sublingual space.   Thyroid: Negative.   Lymph nodes: No cervical lymphadenopathy. Bilateral lymph nodes are subcentimeter, fairly symmetric and within normal limits. No hyperenhancing, cystic, or necrotic lymph nodes identified.   No neck mass or discrete soft tissue inflammation identified.   Vascular: Suboptimal intravascular contrast bolus. Grossly patent major vascular structures in the neck and at the skull base. No significant calcified carotid atherosclerosis.   Limited intracranial: Negative.   Visualized orbits: Negative.   Mastoids and visualized paranasal sinuses: Small bilateral maxillary sinus mucous retention cysts, otherwise clear.   Skeleton: Elongated bilateral stylohyoid ligament calcification. Widespread advanced cervical spine degeneration, primarily disc and endplate degeneration. Up to mild associated cervical spinal stenosis. No acute osseous abnormality identified.   Upper chest: Negative visible trachea. Upper lungs are clear. Mild mediastinal lipomatosis. No superior mediastinal mass or lymphadenopathy.   IMPRESSION: 1. Essentially negative Neck CT; no mass or  lymphadenopathy. 2. 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 3. Advanced cervical spine degeneration.  MRI 10/22 IMPRESSION: 1. 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 2. Mild left foraminal narrowing at C2-3. 3. Moderate right and mild left foraminal narrowing at C3-4. 4. Moderate foraminal narrowing bilaterally at C4-5 and C5-6 is worse on the right. 5. Mild right foraminal narrowing at C6-7. 6. Moderate foraminal narrowing bilaterally at C7-T1 is worse on the left.  PATIENT SURVEYS:  FOTO 31% function using back scale as uploaded for neck/shoulder  Observation:slight edema in L supraclavicular region and periscap region  COGNITION: Overall cognitive status: Within functional limits for tasks assessed   SENSATION: WFL  POSTURE: rounded shoulders, forward head, and increased thoracic kyphosis  PALPATION: TTP L middle trap, rhomboids, levator scap, UT, infraspinatus, scalene; edema in supraclavicular region L>R, L posterior superior scapular region  CERVICAL ROM:   Active ROM AROM  eval AROM 09/13/21  Flexion 25% limited* full  Extension 25% limited* Limited 25%  Right lateral flexion 0% limited 30% limited  Left lateral flexion 25% limited* *25% limited  Right rotation 0% limited 25% limited  Left rotation 25% limited* *50% limited   (Blank rows = not tested) * pain  UPPER EXTREMITY ROM: WFL   Active ROM Right eval  Left eval  Shoulder flexion    Shoulder extension    Shoulder abduction    Shoulder adduction    Shoulder extension    Shoulder internal rotation    Shoulder external rotation    Elbow flexion    Elbow extension    Wrist flexion    Wrist extension    Wrist ulnar deviation    Wrist radial deviation    Wrist pronation    Wrist supination     (Blank rows = not tested) *pain  UPPER EXTREMITY MMT:  MMT Right eval Left eval Right 09/13/21 Left 09/13/21  Shoulder flexion  4+/5 4/5* 5 4*  Shoulder extension      Shoulder abduction 4+/5 4/5* 5 4+*  Shoulder adduction      Shoulder extension      Shoulder internal rotation 5/5 5/5    Shoulder external rotation 5/5 5/5    Middle trapezius      Lower trapezius      Elbow flexion 5/5 5/5    Elbow extension 5/5 5/5    Wrist flexion      Wrist extension      Wrist ulnar deviation      Wrist radial deviation      Wrist pronation      Wrist supination      Grip strength WFL WFL     (Blank rows = not tested) *pain      TODAY'S TREATMENT:  09/26/21 UBE lv1 3/3  Band rows BTB 3 x10 Shoulder extension BTB 3 x 10 Shoulder ER BTB 2 x 10 Shoulder IR BTB 2 x10  High row BTB 2 x 10 Lat pull down 5 plates 2 x 10  Seated upper trap stretch 2 x 30"   IASTM using thera gun Lv 10 to LT upper trap, levator, rhomboid and posterior deltoid (patient seated)   09/22/21 Seated shin tuck (cues to avoid flexion) x10 Seated upper trap stretch 3 x 20"  Band rows BTB 3 x10 Shoulder extension BTB 3 x 10 Shoulder ER BTB 2 x 10 Shoulder IR BTB 2 x10   IASTM using thera gun Lv 10 to LT upper trap, levator, rhomboid and posterior deltoid (patient seated)     09/15/21 Supine scap retractions 2x 10  Supine scap ret with GH ER BTB 2x 10  Supine horizontal abduction BTB 2x 10  Seated thoracic extension 2x 10 5 second holds Manual: L UPA grade II-III T4-T6, STM to L periscap musculature, IASTM too L periscap musculature Prone horizontal abduction 2x 10    09/13/2021 Progress note  FOTO 33 %  Sitting cervical retraction x 10 (increased pain down into left shoulder blade)  Supine cervical retraction 2 x 10  Supine scapular retraction 2 x 10 Supine cervical retraction with left rotation x 10  STM and Manual traction to the cervical spine x 10'    PATIENT EDUCATION:  Education details: 09/15/21 HEP 09/13/21 updated HEP 07/28/21 Patient educated on exam findings, POC, scope of PT, HEP, f/u with MD regarding  supraclavicular edema, and lymphatic system. 08/10/21 HEP Person educated: Patient Education method: Consulting civil engineer, Demonstration, and Handouts Education comprehension: verbalized understanding, returned demonstration, verbal cues required, and tactile cues required   HOME EXERCISE PROGRAM: Access Code: [email or web address] 09/22/21 - Seated Upper Trapezius Stretch  - 2-3 x daily - 7 x weekly - 1 sets - 3 reps - 20-30 second hold   Date: 09/15/2021 - Supine Shoulder External Rotation with Resistance  - 1 x daily -  7 x weekly - 3 sets - 10 reps - Prone Single Arm Shoulder Horizontal Abduction with Scapular Retraction and Palm Down  - 1 x daily - 7 x weekly - 2 sets - 10 reps  Access Code: [email or web address] Date: 09/13/2021 - Supine Chin Tuck  - 1 x daily - 7 x weekly - 1 sets - 10 reps - Supine Scapular Retraction  - 1 x daily - 7 x weekly - 2 sets - 10 reps - Supine Cervical Rotation AROM on Pillow  - 1 x daily - 7 x weekly - 1 sets - 10 reps  07/28/21 begin next session 08/10/21 supine cervical retractions, self STM  ASSESSMENT:  CLINICAL IMPRESSION: Progressed scapular strengthening with added high band row and lat pull downs. Patient cued on proper form and body mechanics. Patient tolerated well, but noted increased muscle fatigue. Ongoing tension in LT hand per recent carpal tunnel surgery. Cues to adjust grip accordingly for comfort. Improved systems noted following manual. Patient will continue to benefit from skilled therapy services to reduce remaining deficits and improve functional ability.    OBJECTIVE IMPAIRMENTS decreased activity tolerance, decreased endurance, decreased mobility, decreased ROM, decreased strength, hypomobility, increased muscle spasms, impaired flexibility, improper body mechanics, postural dysfunction, obesity, and pain.   ACTIVITY LIMITATIONS carrying, lifting, bending, standing, squatting, sleeping, stairs, transfers, bed mobility, bathing, toileting, and reach over  head  PARTICIPATION LIMITATIONS: meal prep, cleaning, laundry, driving, shopping, community activity, occupation, and yard work  PERSONAL FACTORS Fitness, Time since onset of injury/illness/exacerbation, and 3+ comorbidities: Allergies, Arthritis, Back pain, BMI over 30, Headaches, High Blood Pressure  are also affecting patient's functional outcome.   REHAB POTENTIAL: Good  CLINICAL DECISION MAKING: Evolving/moderate complexity  EVALUATION COMPLEXITY: Moderate   GOALS: Goals reviewed with patient? Yes  SHORT TERM GOALS: Target date: 08/11/2021  Patient will be independent with HEP in order to improve functional outcomes. Baseline:  Goal status: IN PROGRESS  2.  Patient will report at least 25% improvement in symptoms for improved quality of life. Baseline:  Goal status: IN PROGRESS    LONG TERM GOALS: Target date: 10/04/21  Patient will report at least 75% improvement in symptoms for improved quality of life. Baseline:  Goal status: IN PROGRESS  2.  Patient will improve FOTO score by at least 5 points in order to indicate improved tolerance to activity. Baseline: 31% function; 09/12/21 33% Goal status: IN PROGRESS  3.  Patient will demonstrate at least 25% improvement in cervical ROM in all restricted planes for improved ability to move head with chores. Baseline: see AROM Goal status: IN PROGRESS  4.  Patient will be able to return to all activities unrestricted for improved ability to perform work functions and participate with family.  Baseline:  Goal status: IN PROGRESS  5.  Patient will demonstrate grade of 5/5 MMT grade in all tested musculature as evidence of improved strength to assist with lifting and dressing.  Baseline: see MMT Goal status: IN PROGRESS      PLAN: PT FREQUENCY: 2x/week  PT DURATION: 3 weeks  PLANNED INTERVENTIONS: Therapeutic exercises, Therapeutic activity, Neuromuscular re-education, Balance training, Gait training, Patient/Family  education, Joint manipulation, Joint mobilization, Stair training, Orthotic/Fit training, DME instructions, Aquatic Therapy, Dry Needling, Electrical stimulation, Spinal manipulation, Spinal mobilization, Cryotherapy, Moist heat, Compression bandaging, scar mobilization, Splintting, Taping, Traction, Ultrasound, Ionotophoresis 20m/ml Dexamethasone, and Manual therapy  PLAN FOR NEXT SESSION:  periscap strength;  cervical/scap mobility, levator and UT stretches. Postural strength, shoulder strength, possibly trial DN  if needed once cleared with neck edema?   1:08 PM, 09/26/21 Josue Hector PT DPT  Physical Therapist with [facility]  [phone]

## 2021-09-29 ENCOUNTER — Ambulatory Visit (HOSPITAL_COMMUNITY): Payer: 59 | Admitting: Physical Therapy

## 2021-09-29 DIAGNOSIS — M542 Cervicalgia: Secondary | ICD-10-CM | POA: Diagnosis not present

## 2021-09-29 DIAGNOSIS — R293 Abnormal posture: Secondary | ICD-10-CM

## 2021-09-29 NOTE — Therapy (Signed)
OUTPATIENT PHYSICAL THERAPY CERVICAL PROGRESS NOTE     Patient Name: Olegario Emberson. 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 Repeat in 4 months. path tubulovillous adenoma, negative for high-grade dysplasia   COLONOSCOPY WITH PROPOFOL N/A 01/20/2019   Procedure: COLONOSCOPY WITH PROPOFOL;  Surgeon: Danie Binder, MD; large defect (2 cm x 1.5 cm with small visible vessel with stigmata of recent bleed) in the cecum.  3 hemostatic clips placed (MR conditional).  One 3 mm polyp in the cecum, tortuous colon, external and internal hemorrhoids.  Pathology revealed tubular adenoma.   HERNIA REPAIR  [id number]   umbilical   NASAL SEPTOPLASTY W/ TURBINOPLASTY Bilateral 11/30/2020   Procedure: NASAL SEPTOPLASTY WITH BILATERAL  TURBINATE  REDUCTION;  Surgeon: Leta Baptist, MD;  Location: [place];  Service: ENT;  Laterality: Bilateral;   POLYPECTOMY  01/13/2019   Procedure: POLYPECTOMY;  Surgeon: Daneil Dolin, MD;  [facility];  Service: Endoscopy;;  Large Cecal Polyp    Patient Active Problem List   Diagnosis Date Noted   Carpal tunnel syndrome, left upper limb 04/28/2021   Bee sting 12/05/2019   Obstructive sleep apnea 03/31/2019   Acute blood loss anemia 01/20/2019   Tachycardia 01/20/2019   Lower GI bleed    Weakness of both lower extremities 01/15/2019   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 He thinks the lat pull downs "got me". He notes increased swelling in LT side of neck. He is ordering a massage gun for home use.   PERTINENT HISTORY:  hypertension, hyperlipidemia, obesity, history of left frontal meningioma that was resected   PAIN:  Are you having pain? No and Yes: NPRS scale: 7/10 Pain location: LT trap, neck   Pain description: sharp Aggravating factors: pressure, movement Relieving factors: ice  PRECAUTIONS: None  WEIGHT BEARING RESTRICTIONS No  FALLS:  Has patient fallen in last 6  months? Yes. Number of falls 1  LIVING ENVIRONMENT: Lives with: lives with their spouse Lives in: House/apartment Stairs: Yes: External: 6 steps; on right going up, on left going up, and can reach both Has following equipment at home: shower chair  OCCUPATION: applied for disability  PLOF: Independent  PATIENT GOALS be able to stop pain and move better  OBJECTIVE:   DIAGNOSTIC FINDINGS:  CLINICAL DATA:  61 year old male with bilateral neck swelling left greater than right. Symptoms reportedly for 1.5 years. Nonpainful. Dizziness.    FINDINGS: Pharynx and larynx: Mild motion artifact, including at the glottis and supraglottic larynx. Mild postinflammatory calcifications of the palatine tonsils. Overall the pharyngeal and laryngeal soft tissue contours are within normal limits. Parapharyngeal and retropharyngeal spaces appear negative.   Salivary glands: Negative.  Negative sublingual space.   Thyroid: Negative.   Lymph nodes: No cervical lymphadenopathy. Bilateral lymph nodes are subcentimeter, fairly symmetric and within normal limits. No hyperenhancing, cystic, or necrotic lymph nodes identified.   No neck mass or discrete soft tissue inflammation identified.   Vascular: Suboptimal intravascular contrast bolus. Grossly patent major vascular structures in the neck and at the skull base. No significant calcified carotid atherosclerosis.   Limited intracranial: Negative.   Visualized orbits: Negative.   Mastoids and visualized paranasal sinuses: Small bilateral maxillary sinus mucous retention cysts, otherwise clear.   Skeleton: Elongated bilateral stylohyoid ligament calcification. Widespread advanced cervical spine degeneration, primarily disc and endplate degeneration. Up to mild associated cervical spinal stenosis. No acute osseous abnormality identified.   Upper chest: Negative visible trachea. Upper lungs are clear. Mild mediastinal lipomatosis. No superior  mediastinal mass or lymphadenopathy.   IMPRESSION: 1. Essentially negative Neck CT; no mass or lymphadenopathy. 2. 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 3. Advanced cervical spine degeneration.  MRI 10/22 IMPRESSION: 1. 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 2. Mild left foraminal narrowing at C2-3. 3. Moderate right and mild left foraminal narrowing at C3-4. 4. Moderate foraminal narrowing bilaterally at C4-5 and C5-6 is worse on the right. 5. Mild right foraminal narrowing at C6-7. 6. Moderate foraminal narrowing bilaterally at C7-T1 is worse on the left.  PATIENT SURVEYS:  FOTO 31% function using back scale as uploaded for neck/shoulder  Observation:slight edema in L supraclavicular region and periscap region  COGNITION: Overall cognitive status: Within functional limits for tasks assessed   SENSATION: WFL  POSTURE: rounded shoulders, forward head, and increased thoracic kyphosis  PALPATION: TTP L middle trap, rhomboids, levator scap, UT, infraspinatus, scalene; edema in supraclavicular region L>R, L posterior superior scapular region  CERVICAL ROM:   Active ROM AROM  eval AROM 09/13/21  Flexion 25% limited* full  Extension 25% limited* Limited 25%  Right lateral flexion 0% limited 30% limited  Left lateral flexion 25% limited* *25% limited  Right rotation 0% limited 25% limited  Left rotation 25%  limited* *50% limited   (Blank rows = not tested) * pain  UPPER EXTREMITY ROM: WFL   Active ROM Right eval Left eval  Shoulder flexion    Shoulder extension    Shoulder abduction    Shoulder adduction    Shoulder extension    Shoulder internal rotation    Shoulder external rotation    Elbow flexion    Elbow extension    Wrist flexion    Wrist extension    Wrist ulnar deviation    Wrist radial deviation    Wrist pronation    Wrist supination     (Blank rows = not tested)  *pain  UPPER EXTREMITY MMT:  MMT Right eval Left eval Right 09/13/21 Left 09/13/21  Shoulder flexion 4+/5 4/5* 5 4*  Shoulder extension      Shoulder abduction 4+/5 4/5* 5 4+*  Shoulder adduction      Shoulder extension      Shoulder internal rotation 5/5 5/5    Shoulder external rotation 5/5 5/5    Middle trapezius      Lower trapezius      Elbow flexion 5/5 5/5    Elbow extension 5/5 5/5    Wrist flexion      Wrist extension      Wrist ulnar deviation      Wrist radial deviation      Wrist pronation      Wrist supination      Grip strength WFL WFL     (Blank rows = not tested) *pain      TODAY'S TREATMENT:  09/29/21 UBE 3/3  Upper trap stretch 3 x 20" Levator stretch 3 x 20"  Chin tuck 15 x 3" Cervical 3D excursion x10 each  IASTM using thera gun Lv 10 to LT upper trap, levator, rhomboid and posterior deltoid (patient seated)   09/26/21 UBE lv1 3/3  Band rows BTB 3 x10 Shoulder extension BTB 3 x 10 Shoulder ER BTB 2 x 10 Shoulder IR BTB 2 x10  High row BTB 2 x 10 Lat pull down 5 plates 2 x 10  Seated upper trap stretch 2 x 30"   IASTM using thera gun Lv 10 to LT upper trap, levator, rhomboid and posterior deltoid (patient seated)   09/22/21 Seated shin tuck (cues to avoid flexion) x10 Seated upper trap stretch 3 x 20"  Band rows BTB 3 x10 Shoulder extension BTB 3 x 10 Shoulder ER BTB 2 x 10 Shoulder IR BTB 2 x10   IASTM using thera gun Lv 10 to LT upper trap, levator, rhomboid and posterior deltoid (patient seated)     09/15/21 Supine scap retractions 2x 10  Supine scap ret with GH ER BTB 2x 10  Supine horizontal abduction BTB 2x 10  Seated thoracic extension 2x 10 5 second holds Manual: L UPA grade II-III T4-T6, STM to L periscap musculature, IASTM too L periscap musculature Prone horizontal abduction 2x 10    09/13/2021 Progress note  FOTO 33 %  Sitting cervical retraction x 10 (increased pain down into left shoulder blade)  Supine  cervical retraction 2 x 10  Supine scapular retraction 2 x 10 Supine cervical retraction with left rotation x 10  STM and Manual traction to the cervical spine x 10'    PATIENT EDUCATION:  Education details: 09/15/21 HEP 09/13/21 updated HEP 07/28/21 Patient educated on exam findings, POC, scope of PT, HEP, f/u with MD regarding supraclavicular edema, and lymphatic system. 08/10/21 HEP Person educated: Patient  Education method: Explanation, Demonstration, and Handouts Education comprehension: verbalized understanding, returned demonstration, verbal cues required, and tactile cues required   HOME EXERCISE PROGRAM: Access Code: [email or web address] 09/22/21 - Seated Upper Trapezius Stretch  - 2-3 x daily - 7 x weekly - 1 sets - 3 reps - 20-30 second hold   Date: 09/15/2021 - Supine Shoulder External Rotation with Resistance  - 1 x daily - 7 x weekly - 3 sets - 10 reps - Prone Single Arm Shoulder Horizontal Abduction with Scapular Retraction and Palm Down  - 1 x daily - 7 x weekly - 2 sets - 10 reps  Access Code: [email or web address] Date: 09/13/2021 - Supine Chin Tuck  - 1 x daily - 7 x weekly - 1 sets - 10 reps - Supine Scapular Retraction  - 1 x daily - 7 x weekly - 2 sets - 10 reps - Supine Cervical Rotation AROM on Pillow  - 1 x daily - 7 x weekly - 1 sets - 10 reps  07/28/21 begin next session 08/10/21 supine cervical retractions, self STM  ASSESSMENT:  CLINICAL IMPRESSION: Graded activity per patient tolerance. Focused more on cervical mobility and postural isometrics today. Patient noting ongoing cervical restriction with flexion and LT rotation. Hypersensitive during manual treatment, notably around LT rhomboid. Patient does note improved symptoms following, however. Educated patient on performing HEP to tolerance for decreased pain levels and avoid painful activity. Patient will continue to benefit from skilled therapy services to reduce remaining deficits and improve functional ability.      OBJECTIVE IMPAIRMENTS decreased activity tolerance, decreased endurance, decreased mobility, decreased ROM, decreased strength, hypomobility, increased muscle spasms, impaired flexibility, improper body mechanics, postural dysfunction, obesity, and pain.   ACTIVITY LIMITATIONS carrying, lifting, bending, standing, squatting, sleeping, stairs, transfers, bed mobility, bathing, toileting, and reach over head  PARTICIPATION LIMITATIONS: meal prep, cleaning, laundry, driving, shopping, community activity, occupation, and yard work  PERSONAL FACTORS Fitness, Time since onset of injury/illness/exacerbation, and 3+ comorbidities: Allergies, Arthritis, Back pain, BMI over 30, Headaches, High Blood Pressure  are also affecting patient's functional outcome.   REHAB POTENTIAL: Good  CLINICAL DECISION MAKING: Evolving/moderate complexity  EVALUATION COMPLEXITY: Moderate   GOALS: Goals reviewed with patient? Yes  SHORT TERM GOALS: Target date: 08/11/2021  Patient will be independent with HEP in order to improve functional outcomes. Baseline:  Goal status: IN PROGRESS  2.  Patient will report at least 25% improvement in symptoms for improved quality of life. Baseline:  Goal status: IN PROGRESS    LONG TERM GOALS: Target date: 10/04/21  Patient will report at least 75% improvement in symptoms for improved quality of life. Baseline:  Goal status: IN PROGRESS  2.  Patient will improve FOTO score by at least 5 points in order to indicate improved tolerance to activity. Baseline: 31% function; 09/12/21 33% Goal status: IN PROGRESS  3.  Patient will demonstrate at least 25% improvement in cervical ROM in all restricted planes for improved ability to move head with chores. Baseline: see AROM Goal status: IN PROGRESS  4.  Patient will be able to return to all activities unrestricted for improved ability to perform work functions and participate with family.  Baseline:  Goal status: IN  PROGRESS  5.  Patient will demonstrate grade of 5/5 MMT grade in all tested musculature as evidence of improved strength to assist with lifting and dressing.  Baseline: see MMT Goal status: IN PROGRESS      PLAN: PT FREQUENCY: 2x/week  PT DURATION: 3 weeks  PLANNED INTERVENTIONS: Therapeutic exercises, Therapeutic activity, Neuromuscular re-education, Balance training, Gait training, Patient/Family education, Joint manipulation, Joint mobilization, Stair training, Orthotic/Fit training, DME instructions, Aquatic Therapy, Dry Needling, Electrical stimulation, Spinal manipulation, Spinal mobilization, Cryotherapy, Moist heat, Compression bandaging, scar mobilization, Splintting, Taping, Traction, Ultrasound, Ionotophoresis 41m/ml Dexamethasone, and Manual therapy  PLAN FOR NEXT SESSION:  periscap strength;  cervical/scap mobility, levator and UT stretches. Postural strength, shoulder strength, possibly trial DN if needed once cleared with neck edema?   11:16 AM, 09/29/21 CJosue HectorPT DPT  Physical Therapist with [facility] ([phone]

## 2021-10-04 ENCOUNTER — Encounter (HOSPITAL_COMMUNITY)
Admission: RE | Admit: 2021-10-04 | Discharge: 2021-10-04 | Disposition: A | Discharge status: Home / Self Care | Payer: 59 | Source: Ambulatory Visit | Attending: Internal Medicine | Admitting: Internal Medicine

## 2021-10-04 ENCOUNTER — Other Ambulatory Visit: Payer: Self-pay

## 2021-10-04 ENCOUNTER — Encounter (HOSPITAL_COMMUNITY): Payer: Self-pay

## 2021-10-06 ENCOUNTER — Encounter (HOSPITAL_COMMUNITY): Payer: Self-pay

## 2021-10-06 ENCOUNTER — Ambulatory Visit (HOSPITAL_COMMUNITY): Payer: 59 | Admitting: Anesthesiology

## 2021-10-06 ENCOUNTER — Encounter (HOSPITAL_COMMUNITY)
Admission: RE | Disposition: A | Discharge status: Home / Self Care | Payer: Self-pay | Source: Home / Self Care | Attending: Internal Medicine

## 2021-10-06 ENCOUNTER — Ambulatory Visit (HOSPITAL_BASED_OUTPATIENT_CLINIC_OR_DEPARTMENT_OTHER): Payer: 59 | Admitting: Anesthesiology

## 2021-10-06 ENCOUNTER — Ambulatory Visit (HOSPITAL_COMMUNITY)
Admission: RE | Admit: 2021-10-06 | Discharge: 2021-10-06 | Disposition: A | Discharge status: Home / Self Care | Payer: 59 | Attending: Internal Medicine | Admitting: Internal Medicine

## 2021-10-06 DIAGNOSIS — D120 Benign neoplasm of cecum: Secondary | ICD-10-CM | POA: Insufficient documentation

## 2021-10-06 DIAGNOSIS — K635 Polyp of colon: Secondary | ICD-10-CM

## 2021-10-06 DIAGNOSIS — Z1211 Encounter for screening for malignant neoplasm of colon: Secondary | ICD-10-CM | POA: Diagnosis not present

## 2021-10-06 DIAGNOSIS — G709 Myoneural disorder, unspecified: Secondary | ICD-10-CM | POA: Diagnosis not present

## 2021-10-06 DIAGNOSIS — Z09 Encounter for follow-up examination after completed treatment for conditions other than malignant neoplasm: Secondary | ICD-10-CM

## 2021-10-06 DIAGNOSIS — Z86010 Personal history of colonic polyps: Secondary | ICD-10-CM | POA: Diagnosis not present

## 2021-10-06 DIAGNOSIS — I10 Essential (primary) hypertension: Secondary | ICD-10-CM | POA: Diagnosis not present

## 2021-10-06 DIAGNOSIS — Z87891 Personal history of nicotine dependence: Secondary | ICD-10-CM | POA: Diagnosis not present

## 2021-10-06 DIAGNOSIS — K648 Other hemorrhoids: Secondary | ICD-10-CM | POA: Diagnosis not present

## 2021-10-06 DIAGNOSIS — G4730 Sleep apnea, unspecified: Secondary | ICD-10-CM | POA: Diagnosis not present

## 2021-10-06 DIAGNOSIS — Z79899 Other long term (current) drug therapy: Secondary | ICD-10-CM | POA: Insufficient documentation

## 2021-10-06 DIAGNOSIS — Z6841 Body Mass Index (BMI) 40.0 and over, adult: Secondary | ICD-10-CM | POA: Insufficient documentation

## 2021-10-06 DIAGNOSIS — D124 Benign neoplasm of descending colon: Secondary | ICD-10-CM | POA: Insufficient documentation

## 2021-10-06 DIAGNOSIS — K219 Gastro-esophageal reflux disease without esophagitis: Secondary | ICD-10-CM | POA: Diagnosis not present

## 2021-10-06 HISTORY — PX: COLONOSCOPY WITH PROPOFOL: SHX5780

## 2021-10-06 HISTORY — PX: POLYPECTOMY: SHX5525

## 2021-10-06 SURGERY — COLONOSCOPY WITH PROPOFOL
Anesthesia: General

## 2021-10-06 MED ORDER — LACTATED RINGERS IV SOLN
INTRAVENOUS | Status: DC
Start: 2021-10-06 — End: 2021-10-06

## 2021-10-06 MED ORDER — LIDOCAINE 2% (20 MG/ML) 5 ML SYRINGE
INTRAMUSCULAR | Status: DC | PRN
  Administered 2021-10-06: 50 mg via INTRAVENOUS

## 2021-10-06 MED ORDER — PROPOFOL 500 MG/50ML IV EMUL
INTRAVENOUS | Status: DC | PRN
  Administered 2021-10-06: 200 ug/kg/min via INTRAVENOUS

## 2021-10-06 MED ORDER — PROPOFOL 10 MG/ML IV BOLUS
INTRAVENOUS | Status: DC | PRN
  Administered 2021-10-06: 130 mg via INTRAVENOUS

## 2021-10-06 NOTE — Transfer of Care (Signed)
Immediate Anesthesia Transfer of Care Note  Patient: Tristen Luce.  Procedure(s) Performed: COLONOSCOPY WITH PROPOFOL POLYPECTOMY  Patient Location: Short Stay  Anesthesia Type:MAC  Level of Consciousness: sedated and patient cooperative  Airway & Oxygen Therapy: Patient Spontanous Breathing  Post-op Assessment: Report given to RN, Post -op Vital signs reviewed and stable and Patient moving all extremities  Post vital signs: Reviewed and stable  Last Vitals:  Vitals Value Taken Time  BP 104/73 10/06/21 0801  Temp 36.9 C 10/06/21 0801  Pulse 96 10/06/21 0801  Resp 18 10/06/21 0801  SpO2 93 % 10/06/21 0801    Last Pain:  Vitals:   10/06/21 0801  TempSrc: Oral  PainSc: 0-No pain      Patients Stated Pain Goal: 7 (13/24/40 1027)  Complications: No notable events documented.

## 2021-10-06 NOTE — Op Note (Signed)
[facility] Patient Name: Micheal Ross Procedure Date: 10/06/2021 7:09 AM MRN: [id number] Date of Birth: 11-Dec-1960 Attending MD: Elon Alas. Abbey Chatters DO CSN: [id number] Age: 61 Admit Type: Outpatient Procedure:                Colonoscopy Indications:              High risk colon cancer surveillance: Personal                            history of adenoma with villous component Providers:                Elon Alas. Abbey Chatters, DO, Charlsie Quest. Insurance claims handler, Therapist, sports,                            Suzan Garibaldi. Risa Grill, Technician Referring MD:              Medicines:                See the Anesthesia note for documentation of the                            administered medications Complications:            No immediate complications. Estimated Blood Loss:     Estimated blood loss was minimal. Procedure:                Pre-Anesthesia Assessment:                           - The anesthesia plan was to use monitored                            anesthesia care (MAC).                           After obtaining informed consent, the colonoscope                            was passed under direct vision. Throughout the                            procedure, the patient's blood pressure, pulse, and                            oxygen saturations were monitored continuously. The                            [id number]-HQ190L ([id number]) scope was introduced through                            the anus and advanced to the the cecum, identified                            by appendiceal orifice and ileocecal valve. The                            colonoscopy  was performed without difficulty. The                            patient tolerated the procedure well. The quality                            of the bowel preparation was evaluated using the                            BBPS [facility] Bowel Preparation Scale) with scores                            of: Right Colon = 3, Transverse Colon = 3 and Left                             Colon = 3 (entire mucosa seen well with no residual                            staining, small fragments of stool or opaque                            liquid). The total BBPS score equals 9. Scope In: 7:38:05 AM Scope Out: 7:53:41 AM Scope Withdrawal Time: 0 hours 14 minutes 0 seconds  Total Procedure Duration: 0 hours 15 minutes 36 seconds  Findings:      The perianal and digital rectal examinations were normal.      Non-bleeding internal hemorrhoids were found during endoscopy.      A 2 mm polyp was found in the cecum. The polyp was sessile. The polyp       was removed with a cold biopsy forceps. Resection and retrieval were       complete.      A 4 mm polyp was found in the descending colon. The polyp was sessile.       The polyp was removed with a cold snare. Resection and retrieval were       complete.      A 2 mm polyp was found in the recto-sigmoid colon. The polyp was       sessile. The polyp was removed with a cold biopsy forceps. Resection and       retrieval were complete.      The exam was otherwise without abnormality. Impression:               - Non-bleeding internal hemorrhoids.                           - One 2 mm polyp in the cecum, removed with a cold                            biopsy forceps. Resected and retrieved.                           - One 4 mm polyp in the descending colon, removed  with a cold snare. Resected and retrieved.                           - One 2 mm polyp at the recto-sigmoid colon,                            removed with a cold biopsy forceps. Resected and                            retrieved.                           - The examination was otherwise normal. Moderate Sedation:      Per Anesthesia Care Recommendation:           - Patient has a contact number available for                            emergencies. The signs and symptoms of potential                            delayed complications were discussed with the                             patient. Return to normal activities tomorrow.                            Written discharge instructions were provided to the                            patient.                           - Resume previous diet.                           - Continue present medications.                           - Await pathology results.                           - Repeat colonoscopy in 5 years for surveillance                            and history of polyps.                           - Return to GI clinic PRN. Procedure Code(s):        --- Professional ---                           [phone], Colonoscopy, flexible; with removal of                            tumor(s), polyp(s), or other lesion(s) by snare  technique                           45380, 59, Colonoscopy, flexible; with biopsy,                            single or multiple Diagnosis Code(s):        --- Professional ---                           Z86.010, Personal history of colonic polyps                           K63.5, Polyp of colon                           K64.8, Other hemorrhoids CPT copyright 2019 American Medical Association. All rights reserved. The codes documented in this report are preliminary and upon coder review may  be revised to meet current compliance requirements. Elon Alas. Abbey Chatters, DO [place] Abbey Chatters, DO 10/06/2021 7:56:44 AM This report has been signed electronically. Number of Addenda: 0

## 2021-10-06 NOTE — Interval H&P Note (Signed)
History and Physical Interval Note:  10/06/2021 7:27 AM  Micheal Ross.  has presented today for surgery, with the diagnosis of H/O COLON POLYPS.  The various methods of treatment have been discussed with the patient and family. After consideration of risks, benefits and other options for treatment, the patient has consented to  Procedure(s) with comments: COLONOSCOPY WITH PROPOFOL (N/A) - 7:30am as a surgical intervention.  The patient's history has been reviewed, patient examined, no change in status, stable for surgery.  I have reviewed the patient's chart and labs.  Questions were answered to the patient's satisfaction.     [name]

## 2021-10-06 NOTE — Anesthesia Preprocedure Evaluation (Signed)
Anesthesia Evaluation  Patient identified by MRN, date of birth, ID band Patient awake    Reviewed: Allergy & Precautions, H&P , NPO status , Patient's Chart, lab work & pertinent test results, reviewed documented beta blocker date and time   Airway Mallampati: II  TM Distance: >3 FB Neck ROM: full    Dental no notable dental hx.    Pulmonary shortness of breath, sleep apnea , former smoker,    Pulmonary exam normal breath sounds clear to auscultation       Cardiovascular Exercise Tolerance: Good hypertension, negative cardio ROS   Rhythm:regular Rate:Normal     Neuro/Psych  Neuromuscular disease negative psych ROS   GI/Hepatic Neg liver ROS, GERD  Medicated,  Endo/Other  Morbid obesity  Renal/GU negative Renal ROS  negative genitourinary   Musculoskeletal   Abdominal   Peds  Hematology  (+) Blood dyscrasia, anemia ,   Anesthesia Other Findings   Reproductive/Obstetrics negative OB ROS                             Anesthesia Physical Anesthesia Plan  ASA: 3  Anesthesia Plan: General   Post-op Pain Management:    Induction:   PONV Risk Score and Plan: Propofol infusion  Airway Management Planned:   Additional Equipment:   Intra-op Plan:   Post-operative Plan:   Informed Consent: I have reviewed the patients History and Physical, chart, labs and discussed the procedure including the risks, benefits and alternatives for the proposed anesthesia with the patient or authorized representative who has indicated his/her understanding and acceptance.     Dental Advisory Given  Plan Discussed with: CRNA  Anesthesia Plan Comments:         Anesthesia Quick Evaluation

## 2021-10-06 NOTE — Discharge Instructions (Signed)
  Colonoscopy Discharge Instructions  Read the instructions outlined below and refer to this sheet in the next few weeks. These discharge instructions provide you with general information on caring for yourself after you leave the hospital. Your doctor may also give you specific instructions. While your treatment has been planned according to the most current medical practices available, unavoidable complications occasionally occur.   ACTIVITY You may resume your regular activity, but move at a slower pace for the next 24 hours.  Take frequent rest periods for the next 24 hours.  Walking will help get rid of the air and reduce the bloated feeling in your belly (abdomen).  No driving for 24 hours (because of the medicine (anesthesia) used during the test).   Do not sign any important legal documents or operate any machinery for 24 hours (because of the anesthesia used during the test).  NUTRITION Drink plenty of fluids.  You may resume your normal diet as instructed by your doctor.  Begin with a light meal and progress to your normal diet. Heavy or fried foods are harder to digest and may make you feel sick to your stomach (nauseated).  Avoid alcoholic beverages for 24 hours or as instructed.  MEDICATIONS You may resume your normal medications unless your doctor tells you otherwise.  WHAT YOU CAN EXPECT TODAY Some feelings of bloating in the abdomen.  Passage of more gas than usual.  Spotting of blood in your stool or on the toilet paper.  IF YOU HAD POLYPS REMOVED DURING THE COLONOSCOPY: No aspirin products for 7 days or as instructed.  No alcohol for 7 days or as instructed.  Eat a soft diet for the next 24 hours.  FINDING OUT THE RESULTS OF YOUR TEST Not all test results are available during your visit. If your test results are not back during the visit, make an appointment with your caregiver to find out the results. Do not assume everything is normal if you have not heard from your  caregiver or the medical facility. It is important for you to follow up on all of your test results.  SEEK IMMEDIATE MEDICAL ATTENTION IF: You have more than a spotting of blood in your stool.  Your belly is swollen (abdominal distention).  You are nauseated or vomiting.  You have a temperature over 101.  You have abdominal pain or discomfort that is severe or gets worse throughout the day.   Your colonoscopy revealed 3 polyp(s) which I removed successfully. Await pathology results, my office will contact you. I recommend repeating colonoscopy in 5 years for surveillance purposes. Otherwise follow up with Gi as needed.    I hope you have a great rest of your week!  Elon Alas. Abbey Chatters, D.O. Gastroenterology and Hepatology [facility]

## 2021-10-07 LAB — SURGICAL PATHOLOGY

## 2021-10-07 NOTE — Anesthesia Postprocedure Evaluation (Signed)
Anesthesia Post Note  Patient: Gahel Safley.  Procedure(s) Performed: COLONOSCOPY WITH PROPOFOL POLYPECTOMY  Patient location during evaluation: Phase II Anesthesia Type: General Level of consciousness: awake Pain management: pain level controlled Vital Signs Assessment: post-procedure vital signs reviewed and stable Respiratory status: spontaneous breathing and respiratory function stable Cardiovascular status: blood pressure returned to baseline and stable Postop Assessment: no headache and no apparent nausea or vomiting Anesthetic complications: no Comments: Late entry   No notable events documented.   Last Vitals:  Vitals:   10/06/21 0653 10/06/21 0801  BP: (!) 125/94 104/73  Pulse: 94 96  Resp: 20 18  Temp: 36.8 C 36.9 C  SpO2: 96% 93%    Last Pain:  Vitals:   10/06/21 0801  TempSrc: Oral  PainSc: 0-No pain                 Louann Sjogren

## 2021-10-10 ENCOUNTER — Encounter (HOSPITAL_COMMUNITY): Payer: 59 | Admitting: Physical Therapy

## 2021-10-12 ENCOUNTER — Encounter: Payer: Self-pay | Admitting: Neurology

## 2021-10-12 ENCOUNTER — Ambulatory Visit: Payer: 59 | Admitting: Neurology

## 2021-10-12 VITALS — BP 154/96 | HR 76 | Ht 70.0 in | Wt 321.5 lb

## 2021-10-12 DIAGNOSIS — R519 Headache, unspecified: Secondary | ICD-10-CM | POA: Diagnosis not present

## 2021-10-12 DIAGNOSIS — M5412 Radiculopathy, cervical region: Secondary | ICD-10-CM | POA: Diagnosis not present

## 2021-10-12 DIAGNOSIS — G5603 Carpal tunnel syndrome, bilateral upper limbs: Secondary | ICD-10-CM | POA: Diagnosis not present

## 2021-10-12 NOTE — Progress Notes (Signed)
[facility]  PATIENT: Micheal Ross. DOB: [birth year]  REQUESTING CLINICIAN: [name], MD HISTORY FROM: Patient and spouse  REASON FOR VISIT: Cervical radiculopathy   HISTORICAL  CHIEF COMPLAINT:  Chief Complaint  Patient presents with   Follow-up    Rm 14. Alone. Takes Lyrica 150 mg twice daily (discuss refills), therapy was very helpful. C/o dizziness and left sided head pain, pressure, and needle sensation.   INTERVAL HISTORY 10/12/21 Patient presents today for follow-up, at last visit plan was to start pregabalin for cervical radiculopathy pain, referral to physical therapy and I also advised patient to follow back with surgeon regarding carpal tunnel release surgery.  He did have the left carpal tunnel release surgery, for the cervical radiculopathy, surgeon has recommended surgery again but patient is deferring.  He is currently getting physical therapy and reports doing the exercises at home with good improvement.  He is on Lyrica and reported the combination of Lyrica and physical therapy actually helps him with his pain. At this moment he is complaining of right-sided above the temple pain . He reports sharp pressure pain, multiple times a day.  And this type of pain has been going on for the past year.  He reports with the pain, he will have some visual disturbance. He denies any loss of vision, denies any nausea, no vomiting.  Pain is at the surgical site for his meningioma resection.   HISTORY OF PRESENT ILLNESS:  This is a 61 year old gentleman past medical history hypertension, hyperlipidemia, obesity, history of left frontal meningioma that was resected who is presenting with complaint of neck pain radiating to the left shoulder since November 2021.  Patient reports pain is almost constant, described as sharp stabbing pain worse when moving his head to the left or laying on his left side.  He did have MRI of the neck which showed mild to moderate  bilateral foraminal narrowing but no signs of myelopathy.  He did have a nerve conduction study which showed bilateral cervical radiculopathy especially involving C8-T1 on the left side and moderate left carpal tunnel syndrome and mild right carpal tunnel syndrome.  He did follow-up with the orthopedic surgery who recommend conservative management for the cervical radiculopathy and surgical management of the left carpal tunnel syndrome. Currently he is on gabapentin 300 mg twice daily but reported help his pain is not controlled. He did completed physical therapy early last year but again no improvement of his symptoms.  Patient also has been reporting of intermittent lightheadedness.  He did report having orthostatic hypotension in the past, his primary care doctor changed his blood pressure medications and since then he has been doing well.  He reported in January he did have a syncopal episode, lasted about 10 to 20 seconds with quick regain of consciousness, no confusion.  He reported if he held his head back or to the left he will feel lightheaded like he is going to pass out.   OTHER MEDICAL CONDITIONS: Hypertension, Hyperlipidemia, Obesity, left frontal meningioma (resected)    REVIEW OF SYSTEMS: Full 14 system review of systems performed and negative with exception of: as noted in the HPI   ALLERGIES: Allergies  Allergen Reactions   Methocarbamol Other (See Comments)    Twitching in sleep   Pollen Extract Other (See Comments)    Stuffy nose/light headaches.    HOME MEDICATIONS: Outpatient Medications Prior to Visit  Medication Sig Dispense Refill   acetaminophen (TYLENOL) 500 MG tablet Take 500-1,000 mg by  mouth every 6 (six) hours as needed (pain).     amLODipine (NORVASC) 2.5 MG tablet Take 2.5 mg by mouth daily.     aspirin EC 81 MG tablet Take 81 mg by mouth once a week. Swallow whole.     cloNIDine (CATAPRES) 0.1 MG tablet Take 0.1 mg by mouth 2 (two) times daily.      fluticasone (FLONASE) 50 MCG/ACT nasal spray Place 2 sprays into both nostrils every other day.     Hemp Oil-Vanillyl Butyl Ether (PROVENT HEMP PAIN RELIEF EX) Apply 1 Application topically daily as needed (pain (neck/shoulders)).     lisinopril (ZESTRIL) 40 MG tablet Take 40 mg by mouth daily at 2 am. (0300-0400)     Multiple Vitamins-Minerals (ONE-A-DAY MENS 50+ ADVANTAGE) TABS Take 1 tablet by mouth once a week.     pregabalin (LYRICA) 150 MG capsule Take 1 capsule (150 mg total) by mouth 2 (two) times daily. 180 capsule 1   rosuvastatin (CRESTOR) 5 MG tablet Take 5 mg by mouth at bedtime.     metoprolol succinate (TOPROL-XL) 25 MG 24 hr tablet Take 25 mg by mouth daily at 2 am. (0300-0400)     No facility-administered medications prior to visit.    PAST MEDICAL HISTORY: Past Medical History:  Diagnosis Date   Brain tumor ([place])    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)   Hypercholesterolemia    Hypertension    Neck pain    Shoulder pain    Sleep apnea    uses bipap at home    PAST SURGICAL HISTORY: Past Surgical History:  Procedure Laterality Date   ANKLE SURGERY     brain tumor removal  2014   CARPAL TUNNEL RELEASE Left 08/15/2021   Procedure: LEFT CARPAL TUNNEL RELEASE;  Surgeon: Marybelle Killings, MD;  Location: [place];  Service: Orthopedics;  Laterality: Left;   COLONOSCOPY WITH PROPOFOL N/A 01/13/2019   Procedure: COLONOSCOPY WITH PROPOFOL;  Surgeon: Daneil Dolin, MD;  20 mm polyp found in the cecum that was removed via piecemeal hot snare polypectomy. Repeat in 4 months. path tubulovillous adenoma, negative for high-grade dysplasia   COLONOSCOPY WITH PROPOFOL N/A 01/20/2019   Procedure: COLONOSCOPY WITH PROPOFOL;  Surgeon: Danie Binder, MD; large defect (2 cm x 1.5 cm with small visible vessel with stigmata of recent bleed) in the cecum.  3 hemostatic clips placed (MR conditional).  One 3 mm polyp in the cecum, tortuous colon, external and internal hemorrhoids.   Pathology revealed tubular adenoma.   HERNIA REPAIR  [id number]   umbilical   NASAL SEPTOPLASTY W/ TURBINOPLASTY Bilateral 11/30/2020   Procedure: NASAL SEPTOPLASTY WITH BILATERAL  TURBINATE REDUCTION;  Surgeon: Leta Baptist, MD;  Location: [facility] [place];  Service: ENT;  Laterality: Bilateral;   POLYPECTOMY  01/13/2019   Procedure: POLYPECTOMY;  Surgeon: Daneil Dolin, MD;  [facility];  Service: Endoscopy;;  Large Cecal Polyp     FAMILY HISTORY: Family History  Problem Relation Age of Onset   Thyroid disease Mother    COPD Father    Cancer Maternal Aunt    Cancer Maternal Uncle        back   Cancer Paternal Uncle        throat    Cancer Cousin        prostate   Colon cancer Neg Hx    Colon polyps Neg Hx     SOCIAL HISTORY: Social History   Socioeconomic History  Marital status: Married    Spouse name: Not on file   Number of children: Not on file   Years of education: Not on file   Highest education level: Not on file  Occupational History   Not on file  Tobacco Use   Smoking status: Former    Packs/day: 0.50    Years: 2.00    Total pack years: 1.00    Types: Cigarettes    Quit date: 03/30/1983    Years since quitting: 38.5   Smokeless tobacco: Never  Vaping Use   Vaping Use: Never used  Substance and Sexual Activity   Alcohol use: Not Currently    Comment: occ   Drug use: No   Sexual activity: Yes    Comment: married for 1985, 6 grown kids  Other Topics Concern   Not on file  Social History Narrative   Not on file   Social Determinants of Health   Financial Resource Strain: Not on file  Food Insecurity: Not on file  Transportation Needs: Not on file  Physical Activity: Not on file  Stress: Not on file  Social Connections: Not on file  Intimate Partner Violence: Not on file    PHYSICAL EXAM  GENERAL EXAM/CONSTITUTIONAL: Vitals:  Vitals:   10/12/21 1118  BP: (!) 154/96  Pulse: 76  Weight: (!) 321 lb 8 oz (145.8 kg)  Height:  _0  (1.778 m)   Body mass index is 46.13 kg/m. Wt Readings from Last 3 Encounters:  10/12/21 (!) 321 lb 8 oz (145.8 kg)  10/06/21 300 lb (136.1 kg)  10/04/21 300 lb (136.1 kg)   Patient is in no distress; well developed, nourished and groomed; neck is supple  EYES: Pupils round and reactive to light, Visual fields full to confrontation, Extraocular movements intacts,   MUSCULOSKELETAL: Gait, strength, tone, movements noted in Neurologic exam below. He does have left cervical paraspinal muscle tenderness  NEUROLOGIC: MENTAL STATUS:      No data to display         awake, alert, oriented to person, place and time recent and remote memory intact normal attention and concentration language fluent, comprehension intact, naming intact fund of knowledge appropriate  CRANIAL NERVE:  2nd, 3rd, 4th, 6th - pupils equal and reactive to light, visual fields full to confrontation, extraocular muscles intact, no nystagmus 5th - facial sensation symmetric 7th - facial strength symmetric 8th - hearing intact 9th - palate elevates symmetrically, uvula midline 11th - shoulder shrug symmetric 12th - tongue protrusion midline  MOTOR:  normal bulk and tone, full strength in the BUE, BLE with good effort but the left is limited by pain  SENSORY:  normal and symmetric to light touch, pinprick, vibration  COORDINATION:  finger-nose-finger, fine finger movements normal  REFLEXES:  deep tendon reflexes present and symmetric  GAIT/STATION:  normal  DIAGNOSTIC DATA (LABS, IMAGING, TESTING) - I reviewed patient records, labs, notes, testing and imaging myself where available.  Lab Results  Component Value Date   WBC 5.8 04/06/2021   HGB 14.6 04/06/2021   HCT 40.4 04/06/2021   MCV 91.0 04/06/2021   PLT 225 04/06/2021      Component Value Date/Time   NA 138 04/06/2021 1450   NA 139 03/31/2019 1053   K 4.0 04/06/2021 1450   CL 105 04/06/2021 1450   CO2 24 04/06/2021 1450    GLUCOSE 101 (H) 04/06/2021 1450   BUN 11 04/06/2021 1450   BUN 13 03/31/2019 1053   CREATININE 1.30 (  H) 08/05/2021 1052   CALCIUM 9.3 04/06/2021 1450   PROT 7.1 01/01/2021 0838   PROT 7.6 03/31/2019 1053   ALBUMIN 4.1 01/01/2021 0838   ALBUMIN 4.8 03/31/2019 1053   AST 20 01/01/2021 0838   ALT 21 01/01/2021 0838   ALKPHOS 69 01/01/2021 0838   BILITOT 0.8 01/01/2021 0838   BILITOT <0.2 03/31/2019 1053   GFRNONAA >60 04/06/2021 1450   GFRAA 72 03/31/2019 1053   Lab Results  Component Value Date   CHOL 224 (H) 03/31/2019   HDL 46 03/31/2019   LDLCALC 147 (H) 03/31/2019   TRIG 173 (H) 03/31/2019   CHOLHDL 4.9 03/31/2019   No results found for: "HGBA1C" No results found for: "VITAMINB12" No results found for: "TSH"  MRI Cervical Spine 12/2020 1. 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 2. Mild left foraminal narrowing at C2-3. 3. Moderate right and mild left foraminal narrowing at C3-4. 4. Moderate foraminal narrowing bilaterally at C4-5 and C5-6 is worse on the right. 5. Mild right foraminal narrowing at C6-7. 6. Moderate foraminal narrowing bilaterally at C7-T1 is worse on the left.   MRI Brain 12/2020 1. No acute intracranial abnormality or significant interval change. 2. Stable left frontal encephalomalacia subjacent to the tumor resection. 3. Bilateral exophthalmos   ASSESSMENT AND PLAN  60 y.o. year old male with history of hypertension, hyperlipidemia, obesity who is presenting for follow up for cervical radiculopathy and carpal tunnel syndrome.  For carpal tunnel syndrome, he already had left carpal tunnel release surgery about 8 weeks ago.  He reported his grip strength is still weak, he has not completed any occupational therapy, He will see Dr. Lorin Mercy tomorrow and will ask for an order for PT/OT.  The right hand is still strong.   In terms of his cervical radiculopathy.  He is doing well on conservative management with physical therapy and  Lyrica.  At times he will have left-sided pain affecting the left shoulder and left side of the neck.  He has tried muscle relaxant but did not tolerate the medicine.  At this time we will continue current management with Lyrica and PT and I advised patient that if he develops worsening weakness, worsening pain, that he will have to contact Dr. Lorin Mercy for further evaluation and possible surgical procedure.  He is also complaining of left-sided head pain, above the temple, at the suture line for his previous meningioma surgery. He does not have any tenderness in the temporal area but I will still obtain a ESR and CRP to rule out any inflammation.  I will contact the patient to go over the results.  If negative then we will continue with Pregabalin.  I will see him in 6 months for follow-up.    1. Cervical radiculopathy   2. Bilateral carpal tunnel syndrome   3. Right-sided headache      Patient Instructions  Continue current medication, including Lyrica 150 mg twice daily Continue physical therapy Follow-up with surgeon as scheduled, ask for referral to Occupational Therapy for the carpal tunnel release surgery CRP and ESR to rule out inflammation Follow-up in 6 months or sooner if worse  Orders Placed This Encounter  Procedures   Sedimentation Rate   C-reactive Protein    No orders of the defined types were placed in this encounter.   Return in about 6 months (around 04/14/2022).   Alric Ran, MD 10/12/2021, 12:54 PM  [facility] [address], [place], [place] [address] [phone])  [phone]

## 2021-10-12 NOTE — Patient Instructions (Addendum)
Continue current medication, including Lyrica 150 mg twice daily Continue physical therapy Follow-up with surgeon as scheduled, ask for referral to Occupational Therapy for the carpal tunnel release surgery CRP and ESR to rule out inflammation Follow-up in 6 months or sooner if worse

## 2021-10-13 ENCOUNTER — Encounter: Payer: Self-pay | Admitting: Orthopaedic Surgery

## 2021-10-13 ENCOUNTER — Ambulatory Visit (INDEPENDENT_AMBULATORY_CARE_PROVIDER_SITE_OTHER): Payer: 59

## 2021-10-13 ENCOUNTER — Ambulatory Visit (INDEPENDENT_AMBULATORY_CARE_PROVIDER_SITE_OTHER): Payer: 59 | Admitting: Orthopaedic Surgery

## 2021-10-13 ENCOUNTER — Encounter (HOSPITAL_COMMUNITY): Payer: 59 | Admitting: Physical Therapy

## 2021-10-13 ENCOUNTER — Ambulatory Visit (HOSPITAL_COMMUNITY): Payer: 59

## 2021-10-13 VITALS — BP 157/101 | Ht 70.0 in | Wt 321.0 lb

## 2021-10-13 DIAGNOSIS — M545 Low back pain, unspecified: Secondary | ICD-10-CM

## 2021-10-13 DIAGNOSIS — G5602 Carpal tunnel syndrome, left upper limb: Secondary | ICD-10-CM

## 2021-10-13 LAB — SEDIMENTATION RATE: Sed Rate: 3 mm/hr (ref 0–30)

## 2021-10-13 LAB — C-REACTIVE PROTEIN: CRP: 1 mg/L (ref 0–10)

## 2021-10-13 NOTE — Progress Notes (Signed)
Office Visit Note   Patient: Micheal Ross.           Date of Birth: January 23, 1961           MRN: [id number] Visit Date: 10/13/2021              Requested by: [name], MD [address] [place],  [place] [id number] PCP: [name], MD   Assessment & Plan: Visit Diagnoses:  1. Acute bilateral low back pain, unspecified whether sciatica present   2. Carpal tunnel syndrome, left upper limb   3. Low back pain without sciatica, unspecified back pain laterality, unspecified chronicity     Plan: We discussed a walking program to help with weight loss to help with his lower back symptoms.  He has gone to therapy concerning his neck in the past.  He can rub some Aspercreme over his thumb over the flexor surface.  If he develops active triggering and can return for an injection.  We discussed flexor tendon tenosynovitis but he has not had any active triggering just discomfort and stiffness with thumb flexion.  If he develops triggering he can return otherwise follow-up as needed.  Follow-Up Instructions: No follow-ups on file.   Orders:  Orders Placed This Encounter  Procedures   XR Lumbar Spine 2-3 Views   No orders of the defined types were placed in this encounter.     Procedures: No procedures performed   Clinical Data: No additional findings.   Subjective: Chief Complaint  Patient presents with   Left Hand - Follow-up, Routine Post Op    08/15/2021 left CTR   Lower Back - Pain    HPI 61 year old male returns post carpal tunnel release 08/15/2021 on the left he states he is not able to make a fist on the left hand.  He then demonstrates he is able to flex his fingers to the distal palmar crease.  He has some pain with flexion of the thumb and some tenderness over the A1 pulley but no locking.  Carpal tunnel incision is well-healed.  He states he continues to have neck problems and we again reviewed his MRI scan cervical spine which shows mild to moderate narrowing of the  foramina but no central cord compression.  He had only mild carpal tunnel changes on the right which is not bothering him currently.  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  We reviewed the MRI and again today we discussed no cervical procedure is planned.  Patient states she has had problems with back pain and stinging pain.  He is concerned that he has a pinched nerve in his lower back.  Review of Systems all other systems update unchanged.    Objective: Vital Signs: BP (!) 157/101   Ht '5\' 10"'$  (1.778 m)   Wt (!) 321 lb (145.6 kg)   BMI 46.06 kg/m   Physical Exam Constitutional:      Appearance: He is well-developed.  HENT:     Head: Normocephalic and atraumatic.     Right Ear: External ear normal.     Left Ear: External ear normal.  Eyes:     Pupils: Pupils are equal, round, and reactive to light.  Neck:     Thyroid: No thyromegaly.     Trachea: No tracheal deviation.  Cardiovascular:     Rate and Rhythm: Normal rate.  Pulmonary:     Effort: Pulmonary effort is normal.     Breath sounds: No  wheezing.  Abdominal:     General: Bowel sounds are normal.     Palpations: Abdomen is soft.  Musculoskeletal:     Cervical back: Neck supple.  Skin:    General: Skin is warm and dry.     Capillary Refill: Capillary refill takes less than 2 seconds.  Neurological:     Mental Status: He is alert and oriented to person, place, and time.  Psychiatric:        Behavior: Behavior normal.        Thought Content: Thought content normal.        Judgment: Judgment normal.     Ortho Exam well-healed left carpal tunnel incision.  He has some tenderness over the A1 pulley left thumb but no active triggering.  No triggering of the digits he can flex fingertips to distal palmar crease index through small.  Good finger extension.  Patient is able to heel and toe walk.  He gets from sitting to standing position.  Anterior tib gastrocsoleus is strong negative logroll  the hips.  No rash on exposed skin lower extremities.  No pitting edema.  Specialty Comments:  No specialty comments available.  Imaging: XR Lumbar Spine 2-3 Views  Result Date: 10/13/2021 2 view x-rays lumbar are obtained and reviewed.  Body habitus obscures some detail due to patient's size.  There is some mild narrowing at L3-4 without spondylolisthesis.  Normal lumbar curvature.  No acute bone changes. Impression: Lumbar spine radiographs negative for acute changes.    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   Rectal bleeding 01/15/2019   Gastroesophageal reflux disease 01/15/2019   Shortness of breath 01/15/2019   H/O adenomatous polyp of colon 11/14/2018   Hypertension 03/29/2018   Hyperlipidemia 03/29/2018   Meningioma ([place]) 09/20/2012   Past Medical History:  Diagnosis Date   Brain tumor ([place])    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)   Hypercholesterolemia    Hypertension    Neck pain    Shoulder pain    Sleep apnea    uses bipap at home    Family History  Problem Relation Age of Onset   Thyroid disease Mother    COPD Father    Cancer Maternal Aunt    Cancer Maternal Uncle        back   Cancer Paternal Uncle        throat    Cancer Cousin        prostate   Colon cancer Neg Hx    Colon polyps Neg Hx     Past Surgical History:  Procedure Laterality Date   ANKLE SURGERY     brain tumor removal  2014   CARPAL TUNNEL RELEASE Left 08/15/2021   Procedure: LEFT CARPAL TUNNEL RELEASE;  Surgeon: Marybelle Killings, MD;  Location: [facility];  Service: Orthopedics;  Laterality: Left;   COLONOSCOPY WITH PROPOFOL N/A 01/13/2019    Procedure: COLONOSCOPY WITH PROPOFOL;  Surgeon: Daneil Dolin, MD;  20 mm polyp found in the cecum that was removed via piecemeal hot snare polypectomy. Repeat in 4 months. path tubulovillous adenoma, negative for high-grade dysplasia   COLONOSCOPY  WITH PROPOFOL N/A 01/20/2019   Procedure: COLONOSCOPY WITH PROPOFOL;  Surgeon: Danie Binder, MD; large defect (2 cm x 1.5 cm with small visible vessel with stigmata of recent bleed) in the cecum.  3 hemostatic clips placed (MR conditional).  One 3 mm polyp in the cecum, tortuous colon, external and internal hemorrhoids.  Pathology revealed tubular adenoma.   COLONOSCOPY WITH PROPOFOL N/A 10/06/2021   Procedure: COLONOSCOPY WITH PROPOFOL;  Surgeon: [name], DO;  [facility];  Service: Endoscopy;  Laterality: N/A;  7:30am   HERNIA REPAIR  [id number]   umbilical   NASAL SEPTOPLASTY W/ TURBINOPLASTY Bilateral 11/30/2020   Procedure: NASAL SEPTOPLASTY WITH BILATERAL  TURBINATE REDUCTION;  Surgeon: Leta Baptist, MD;  Location: [facility];  Service: ENT;  Laterality: Bilateral;   POLYPECTOMY  01/13/2019   Procedure: POLYPECTOMY;  Surgeon: Daneil Dolin, MD;  [facility];  Service: Endoscopy;;  Large Cecal Polyp    POLYPECTOMY  10/06/2021   Procedure: POLYPECTOMY;  Surgeon: [name], DO;  [facility];  Service: Endoscopy;;   Social History   Occupational History   Not on file  Tobacco Use   Smoking status: Former    Packs/day: 0.50    Years: 2.00    Total pack years: 1.00    Types: Cigarettes    Quit date: 03/30/1983    Years since quitting: 38.5   Smokeless tobacco: Never  Vaping Use   Vaping Use: Never used  Substance and Sexual Activity   Alcohol use: Not Currently    Comment: occ   Drug use: No   Sexual activity: Yes    Comment: married for 1985, 6 grown kids

## 2021-10-17 ENCOUNTER — Encounter: Payer: Self-pay | Admitting: Cardiology

## 2021-10-17 ENCOUNTER — Encounter (HOSPITAL_COMMUNITY): Payer: 59 | Admitting: Physical Therapy

## 2021-10-17 ENCOUNTER — Ambulatory Visit: Payer: 59 | Admitting: Cardiology

## 2021-10-17 VITALS — BP 130/84 | HR 86 | Ht 70.0 in | Wt 314.8 lb

## 2021-10-17 DIAGNOSIS — G4733 Obstructive sleep apnea (adult) (pediatric): Secondary | ICD-10-CM | POA: Diagnosis not present

## 2021-10-17 DIAGNOSIS — I10 Essential (primary) hypertension: Secondary | ICD-10-CM

## 2021-10-17 NOTE — Progress Notes (Signed)
Clinical Summary Micheal Ross is a 61 y.o.male seen today for follow up of the following medical rpoblems. This is a focused visit on recent issues with HTN   1. HTN - has had few different ER visitis with HTN - 160/107, 163/113 prior to recent med changes - per pcp now he is on lisinopril 40, norvasc '5mg'$  daily, clonidine 0.'1mg'$  bid.  - home bp's have been 120s/70s - weight is up  293 -->314 since I saw in 06/2021    Other medical issues not addressed this visit  1.Syncope - seen in ER 04/06/21 - was having abdominal and neck pain at the time - EKG NSR, benign labs - 04/2021 echo pcp office: grossly normal LVEF, limited visualization. Grossly no major valve abnormalitiy - pcp 14 day zio patch: min HR 50, max HR 152, Avg HR 73. Rare supraventricular ectopy, three runs of SVT longest 8 beats. Frequent isolated PVCs (6.3%), no NSVT or VT.      - symptoms started Jan 30 - Jan 31 in bathroom. Stood up to walk to bathroom, walked to bathroom. Washing your hands. Felt lightheaded, then passed out and felt going down. Came to quickly, very brief LOC. Got up and family helped back to bedroom. Rested and layed in bed - no issues leading up to that day     Lightheadedness, vision blurry episodes daily. Can occur with with sitting or standing. Can be laying down and symptoms. Some palpitatons at times.    Sometimes if laying on left side and tilts head to left, feels like he may pass out. Resolves with position change.    Pcp started toprol, stopped norvasc.    Drinks 6-7 bottles water daily - 2020 carotid US minor carotid stenosis   2.Chronic neck pain - followed by neurosurgery       3. Preoperative evaluation - considering colonscopy Dr Abbey Chatters -carpal tunnel surgery Dr Lorin Mercy      5. OSA - compliant with bipap - sleep study 2021 Past Medical History:  Diagnosis Date   Brain tumor ([place])    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)   Hypercholesterolemia    Hypertension    Neck  pain    Shoulder pain    Sleep apnea    uses bipap at home     Allergies  Allergen Reactions   Methocarbamol Other (See Comments)    Twitching in sleep   Pollen Extract Other (See Comments)    Stuffy nose/light headaches.     Current Outpatient Medications  Medication Sig Dispense Refill   acetaminophen (TYLENOL) 500 MG tablet Take 500-1,000 mg by mouth every 6 (six) hours as needed (pain).     amLODipine (NORVASC) 2.5 MG tablet Take 2.5 mg by mouth daily.     aspirin EC 81 MG tablet Take 81 mg by mouth once a week. Swallow whole.     cloNIDine (CATAPRES) 0.1 MG tablet Take 0.1 mg by mouth 2 (two) times daily.     fluticasone (FLONASE) 50 MCG/ACT nasal spray Place 2 sprays into both nostrils every other day.     Hemp Oil-Vanillyl Butyl Ether (PROVENT HEMP PAIN RELIEF EX) Apply 1 Application topically daily as needed (pain (neck/shoulders)).     lisinopril (ZESTRIL) 40 MG tablet Take 40 mg by mouth daily at 2 am. (0300-0400)     Multiple Vitamins-Minerals (ONE-A-DAY MENS 50+ ADVANTAGE) TABS Take 1 tablet by mouth once a week.     pregabalin (LYRICA) 150 MG capsule Take 1 capsule (  150 mg total) by mouth 2 (two) times daily. 180 capsule 1   rosuvastatin (CRESTOR) 5 MG tablet Take 5 mg by mouth at bedtime.     No current facility-administered medications for this visit.     Past Surgical History:  Procedure Laterality Date   ANKLE SURGERY     brain tumor removal  2014   CARPAL TUNNEL RELEASE Left 08/15/2021   Procedure: LEFT CARPAL TUNNEL RELEASE;  Surgeon: Marybelle Killings, MD;  Location: [facility];  Service: Orthopedics;  Laterality: Left;   COLONOSCOPY WITH PROPOFOL N/A 01/13/2019   Procedure: COLONOSCOPY WITH PROPOFOL;  Surgeon: Daneil Dolin, MD;  20 mm polyp found in the cecum that was removed via piecemeal hot snare polypectomy. Repeat in 4 months. path tubulovillous adenoma, negative for high-grade dysplasia   COLONOSCOPY WITH PROPOFOL N/A 01/20/2019    Procedure: COLONOSCOPY WITH PROPOFOL;  Surgeon: Danie Binder, MD; large defect (2 cm x 1.5 cm with small visible vessel with stigmata of recent bleed) in the cecum.  3 hemostatic clips placed (MR conditional).  One 3 mm polyp in the cecum, tortuous colon, external and internal hemorrhoids.  Pathology revealed tubular adenoma.   COLONOSCOPY WITH PROPOFOL N/A 10/06/2021   Procedure: COLONOSCOPY WITH PROPOFOL;  Surgeon: [name], DO;  [facility];  Service: Endoscopy;  Laterality: N/A;  7:30am   HERNIA REPAIR  [id number]   umbilical   NASAL SEPTOPLASTY W/ TURBINOPLASTY Bilateral 11/30/2020   Procedure: NASAL SEPTOPLASTY WITH BILATERAL  TURBINATE REDUCTION;  Surgeon: Leta Baptist, MD;  Location: [facility];  Service: ENT;  Laterality: Bilateral;   POLYPECTOMY  01/13/2019   Procedure: POLYPECTOMY;  Surgeon: Daneil Dolin, MD;  [facility];  Service: Endoscopy;;  Large Cecal Polyp    POLYPECTOMY  10/06/2021   Procedure: POLYPECTOMY;  Surgeon: [name], DO;  [facility];  Service: Endoscopy;;     Allergies  Allergen Reactions   Methocarbamol Other (See Comments)    Twitching in sleep   Pollen Extract Other (See Comments)    Stuffy nose/light headaches.      Family History  Problem Relation Age of Onset   Thyroid disease Mother    COPD Father    Cancer Maternal Aunt    Cancer Maternal Uncle        back   Cancer Paternal Uncle        throat    Cancer Cousin        prostate   Colon cancer Neg Hx    Colon polyps Neg Hx      Social History Micheal Ross reports that he quit smoking about 38 years ago. His smoking use included cigarettes. He has a 1.00 pack-year smoking history. He has never used smokeless tobacco. Micheal Ross reports that he does not currently use alcohol.   Review of Systems CONSTITUTIONAL: No weight loss, fever, chills, weakness or fatigue.  HEENT: Eyes: No visual loss, blurred vision, double vision or  yellow sclerae.No hearing loss, sneezing, congestion, runny nose or sore throat.  SKIN: No rash or itching.  CARDIOVASCULAR: per hpi RESPIRATORY: No shortness of breath, cough or sputum.  GASTROINTESTINAL: No anorexia, nausea, vomiting or diarrhea. No abdominal pain or blood.  GENITOURINARY: No burning on urination, no polyuria NEUROLOGICAL: No headache, dizziness, syncope, paralysis, ataxia, numbness or tingling in the extremities. No change in bowel or bladder control.  MUSCULOSKELETAL: No muscle, back pain, joint pain or stiffness.  LYMPHATICS:  No enlarged nodes. No history of splenectomy.  PSYCHIATRIC: No history of depression or anxiety.  ENDOCRINOLOGIC: No reports of sweating, cold or heat intolerance. No polyuria or polydipsia.  [name] Kitchen   Physical Examination Today's Vitals   10/17/21 1525  BP: 130/84  Pulse: 86  SpO2: 97%  Weight: (!) 314 lb 12.8 oz (142.8 kg)  Height: '5\' 10"'$  (1.778 m)   Body mass index is 45.17 kg/m.  Gen: resting comfortably, no acute distress HEENT: no scleral icterus, pupils equal round and reactive, no palptable cervical adenopathy,  CV: RRR, no m/r/ gno jvd Resp: Clear to auscultation bilaterally GI: abdomen is soft, non-tender, non-distended, normal bowel sounds, no hepatosplenomegaly MSK: extremities are warm, no edema.  Skin: warm, no rash Neuro:  no focal deficits Psych: appropriate affect      Assessment and Plan  HTN - recent increase likely due to significant weight gain over the last few months - adjustments made by pcp, on current regimen he is at goal. Tolerating clonidine at this time, can continue. If progressive of HTN would titrate norvasc, consider thiazide diuretic. - discussed DASH diet, weight loss  2. OSA - needs to establish with provider, refer to [facility]     Arnoldo Lenis, M.D

## 2021-10-17 NOTE — Patient Instructions (Addendum)
Medication Instructions:  Continue all other medications.     Labwork: none  Testing/Procedures: none  Follow-Up: 6 months   Any Other Special Instructions Will Be Listed Below (If Applicable). DASH diet info given today.  You have been referred to Pulmonary    If you need a refill on your cardiac medications before your next appointment, please call your pharmacy.

## 2021-10-18 ENCOUNTER — Ambulatory Visit: Payer: 59 | Admitting: Neurology

## 2021-10-19 ENCOUNTER — Encounter (HOSPITAL_COMMUNITY): Payer: 59 | Admitting: Physical Therapy

## 2021-10-24 ENCOUNTER — Telehealth: Payer: Self-pay | Admitting: Cardiology

## 2021-10-24 NOTE — Telephone Encounter (Signed)
Increase norvasc to '10mg'$  daily. Notify us next week if bp staying above consistently above 130/80  Zandra Abts MD

## 2021-10-24 NOTE — Telephone Encounter (Signed)
New Message:    Patient said Dr Harl Bowie told him if his blood pressure stays up for 3 consecutive days to please call the office.     Pt c/o BP issue: STAT if pt c/o blurred vision, one-sided weakness or slurred speech  1. What are your last 5 BP readings? 127/88, 135/88, 144 89 this morning- patient said these are high readings for him  2. Are you having any other symptoms (ex. Dizziness, headache, blurred vision, passed out)?  a little dizziness and little  blurriness  3. What is your BP issue? high blood pressure

## 2021-10-25 MED ORDER — AMLODIPINE BESYLATE 10 MG PO TABS: 10.0000 mg | ORAL_TABLET | Freq: Every day | ORAL | 2 refills | Status: DC

## 2021-10-25 NOTE — Telephone Encounter (Signed)
Patient informed and verbalized understanding of plan. 

## 2021-10-26 ENCOUNTER — Ambulatory Visit (HOSPITAL_COMMUNITY): Payer: 59 | Attending: Neurology | Admitting: Physical Therapy

## 2021-10-26 ENCOUNTER — Encounter (HOSPITAL_COMMUNITY): Payer: Self-pay | Admitting: Physical Therapy

## 2021-10-26 DIAGNOSIS — M542 Cervicalgia: Secondary | ICD-10-CM | POA: Insufficient documentation

## 2021-10-26 DIAGNOSIS — R293 Abnormal posture: Secondary | ICD-10-CM | POA: Diagnosis present

## 2021-10-26 DIAGNOSIS — R29898 Other symptoms and signs involving the musculoskeletal system: Secondary | ICD-10-CM | POA: Diagnosis present

## 2021-10-26 NOTE — Therapy (Signed)
OUTPATIENT PHYSICAL THERAPY CERVICAL PROGRESS NOTE     Patient Name: Micheal Ross. MRN: [id number] DOB:Apr 01, 1960, 61 y.o., male Today's Date: 10/26/2021  Progress Note   Reporting Period 09/13/21 to 10/26/21   See note below for Objective Data and Assessment of Progress/Goals    PT End of Session - 10/26/21 1123     Visit Number 9    Number of Visits 14    Date for PT Re-Evaluation 11/23/21    Authorization Type [employer] (30 visits combined PTOT, ST, 0 used) (no auth unless 30 visits needed)    Authorization Time Period will be transitioning to new insurance in September 2023 - check info    PT Start Time 1122    PT Stop Time 1203    PT Time Calculation (min) 41 min    Activity Tolerance Patient tolerated treatment well    Behavior During Therapy WFL for tasks assessed/performed                Past Medical History:  Diagnosis Date   Brain tumor ([place])    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)   Hypercholesterolemia    Hypertension    Neck pain    Shoulder pain    Sleep apnea    uses bipap at home   Past Surgical History:  Procedure Laterality Date   ANKLE SURGERY     brain tumor removal  2014   CARPAL TUNNEL RELEASE Left 08/15/2021   Procedure: LEFT CARPAL TUNNEL RELEASE;  Surgeon: Marybelle Killings, MD;  Location: [place];  Service: Orthopedics;  Laterality: Left;   COLONOSCOPY WITH PROPOFOL N/A 01/13/2019   Procedure: COLONOSCOPY WITH PROPOFOL;  Surgeon: Daneil Dolin, MD;  20 mm polyp found in the cecum that was removed via piecemeal hot snare polypectomy. Repeat in 4 months. path tubulovillous adenoma, negative for high-grade dysplasia   COLONOSCOPY WITH PROPOFOL N/A 01/20/2019   Procedure: COLONOSCOPY WITH PROPOFOL;  Surgeon: Danie Binder, MD; large defect (2 cm x 1.5 cm with small visible vessel with stigmata of recent bleed) in the cecum.  3 hemostatic clips placed (MR conditional).  One 3 mm polyp in the cecum, tortuous colon,  external and internal hemorrhoids.  Pathology revealed tubular adenoma.   COLONOSCOPY WITH PROPOFOL N/A 10/06/2021   Procedure: COLONOSCOPY WITH PROPOFOL;  Surgeon: [name], DO;  [facility];  Service: Endoscopy;  Laterality: N/A;  7:30am   HERNIA REPAIR  [id number]   umbilical   NASAL SEPTOPLASTY W/ TURBINOPLASTY Bilateral 11/30/2020   Procedure: NASAL SEPTOPLASTY WITH BILATERAL  TURBINATE REDUCTION;  Surgeon: Leta Baptist, MD;  Location: [place];  Service: ENT;  Laterality: Bilateral;   POLYPECTOMY  01/13/2019   Procedure: POLYPECTOMY;  Surgeon: Daneil Dolin, MD;  [facility]: [facility];  Service: Endoscopy;;  Large Cecal Polyp    POLYPECTOMY  10/06/2021   Procedure: POLYPECTOMY;  Surgeon: [name], DO;  [facility];  Service: Endoscopy;;   Patient Active Problem List   Diagnosis Date Noted   Low back pain 10/13/2021   Carpal tunnel syndrome, left upper limb 04/28/2021   Bee sting 12/05/2019   Obstructive sleep apnea 03/31/2019   Acute blood loss anemia 01/20/2019   Tachycardia 01/20/2019   Lower GI bleed    Weakness of both lower extremities 01/15/2019   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   Rectal bleeding 01/15/2019   Gastroesophageal reflux disease 01/15/2019   Shortness of breath 01/15/2019   H/O  adenomatous polyp of colon 11/14/2018   Hypertension 03/29/2018   Hyperlipidemia 03/29/2018   Meningioma ([place]) 09/20/2012    PCP: [name] MD  REFERRING PROVIDER: Alric Ran, MD   REFERRING DIAG: [phone] (ICD-10-CM) - Cervical radiculopathy   THERAPY DIAG:  Cervicalgia  Abnormal posture  Other symptoms and signs involving the musculoskeletal system  Rationale for Evaluation and Treatment Rehabilitation  ONSET DATE: November 2021  SUBJECTIVE:                                                                                                                                                                                                          SUBJECTIVE STATEMENT: Patient states he has pain in back of neck and L UT. He got a Production assistant, radio for neck. Symptoms are down from 9-10 in the past to about a 6-7/10 now. Wife has been using percussion gun on him. Patient states about 50% improvement with PT intervention. Symptoms have decreased but remain. Feels better with coming to PT.   PERTINENT HISTORY:  hypertension, hyperlipidemia, obesity, history of left frontal meningioma that was resected   PAIN:  Are you having pain? No and Yes: NPRS scale: 7/10 Pain location: LT trap, neck   Pain description: sharp Aggravating factors: pressure, movement Relieving factors: ice  PRECAUTIONS: None  WEIGHT BEARING RESTRICTIONS No  FALLS:  Has patient fallen in last 6 months? Yes. Number of falls 1  LIVING ENVIRONMENT: Lives with: lives with their spouse Lives in: House/apartment Stairs: Yes: External: 6 steps; on right going up, on left going up, and can reach both Has following equipment at home: shower chair  OCCUPATION: applied for disability  PLOF: Independent  PATIENT GOALS be able to stop pain and move better  OBJECTIVE:   DIAGNOSTIC FINDINGS:  CLINICAL DATA:  61 year old male with bilateral neck swelling left greater than right. Symptoms reportedly for 1.5 years. Nonpainful. Dizziness.    FINDINGS: Pharynx and larynx: Mild motion artifact, including at the glottis and supraglottic larynx. Mild postinflammatory calcifications of the palatine tonsils. Overall the pharyngeal and laryngeal soft tissue contours are within normal limits. Parapharyngeal and retropharyngeal spaces appear negative.   Salivary glands: Negative.  Negative sublingual space.   Thyroid: Negative.   Lymph nodes: No cervical lymphadenopathy. Bilateral lymph nodes are subcentimeter, fairly symmetric and within normal limits. No hyperenhancing, cystic, or necrotic lymph nodes identified.   No neck mass or discrete soft tissue  inflammation identified.   Vascular: Suboptimal intravascular contrast bolus. Grossly patent major vascular structures in the neck and at the skull base. No significant  calcified carotid atherosclerosis.   Limited intracranial: Negative.   Visualized orbits: Negative.   Mastoids and visualized paranasal sinuses: Small bilateral maxillary sinus mucous retention cysts, otherwise clear.   Skeleton: Elongated bilateral stylohyoid ligament calcification. Widespread advanced cervical spine degeneration, primarily disc and endplate degeneration. Up to mild associated cervical spinal stenosis. No acute osseous abnormality identified.   Upper chest: Negative visible trachea. Upper lungs are clear. Mild mediastinal lipomatosis. No superior mediastinal mass or lymphadenopathy.   IMPRESSION: 1. Essentially negative Neck CT; no mass or lymphadenopathy. 2. 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 3. Advanced cervical spine degeneration.  MRI 10/22 IMPRESSION: 1. 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 2. Mild left foraminal narrowing at C2-3. 3. Moderate right and mild left foraminal narrowing at C3-4. 4. Moderate foraminal narrowing bilaterally at C4-5 and C5-6 is worse on the right. 5. Mild right foraminal narrowing at C6-7. 6. Moderate foraminal narrowing bilaterally at C7-T1 is worse on the left.  PATIENT SURVEYS:  FOTO 31% function using back scale as uploaded for neck/shoulder  8/23: 46% function using back scale as uploaded for neck/shoulder  Observation:slight edema in L supraclavicular region and periscap region  COGNITION: Overall cognitive status: Within functional limits for tasks assessed   SENSATION: WFL  POSTURE: rounded shoulders, forward head, and increased thoracic kyphosis  PALPATION: TTP L middle trap, rhomboids, levator scap, UT, infraspinatus, scalene; edema in supraclavicular region L>R,  L posterior superior scapular region 10/26/21: TTP L UT, L rhomboids/middle trap  CERVICAL ROM:   Active ROM AROM  eval AROM 09/13/21 AROM 10/26/21  Flexion 25% limited* full 0% limited  Extension 25% limited* Limited 25% 25% limited  Right lateral flexion 0% limited 30% limited 10% limited  Left lateral flexion 25% limited* *25% limited 0% limited  Right rotation 0% limited 25% limited 0% limited  Left rotation 25% limited* *50% limited 0% limited   (Blank rows = not tested) * pain  UPPER EXTREMITY ROM: [facility]   Active ROM Right eval Left eval  Shoulder flexion    Shoulder extension    Shoulder abduction    Shoulder adduction    Shoulder extension    Shoulder internal rotation    Shoulder external rotation    Elbow flexion    Elbow extension    Wrist flexion    Wrist extension    Wrist ulnar deviation    Wrist radial deviation    Wrist pronation    Wrist supination     (Blank rows = not tested) *pain  UPPER EXTREMITY MMT:  MMT Right eval Left eval Right 09/13/21 Left 09/13/21 Right 10/26/21 Left  10/26/21  Shoulder flexion 4+/5 4/5* 5 4* 5 4+  Shoulder extension        Shoulder abduction 4+/5 4/5* 5 4+* 5 4+*  Shoulder adduction        Shoulder extension        Shoulder internal rotation 5/5 5/5      Shoulder external rotation 5/5 5/5      Middle trapezius        Lower trapezius        Elbow flexion 5/5 5/5      Elbow extension 5/5 5/5      Wrist flexion        Wrist extension        Wrist ulnar deviation        Wrist radial deviation        Wrist pronation  Wrist supination        Grip strength WFL WFL       (Blank rows = not tested) *pain      TODAY'S TREATMENT:  10/26/21 Reassessment Education   09/29/21 UBE 3/3  Upper trap stretch 3 x 20" Levator stretch 3 x 20"  Chin tuck 15 x 3" Cervical 3D excursion x10 each  IASTM using thera gun Lv 10 to LT upper trap, levator, rhomboid and posterior deltoid (patient seated)   09/26/21 UBE lv1  3/3  Band rows BTB 3 x10 Shoulder extension BTB 3 x 10 Shoulder ER BTB 2 x 10 Shoulder IR BTB 2 x10  High row BTB 2 x 10 Lat pull down 5 plates 2 x 10  Seated upper trap stretch 2 x 30"   IASTM using thera gun Lv 10 to LT upper trap, levator, rhomboid and posterior deltoid (patient seated)   09/22/21 Seated shin tuck (cues to avoid flexion) x10 Seated upper trap stretch 3 x 20"  Band rows BTB 3 x10 Shoulder extension BTB 3 x 10 Shoulder ER BTB 2 x 10 Shoulder IR BTB 2 x10   IASTM using thera gun Lv 10 to LT upper trap, levator, rhomboid and posterior deltoid (patient seated)     09/15/21 Supine scap retractions 2x 10  Supine scap ret with GH ER BTB 2x 10  Supine horizontal abduction BTB 2x 10  Seated thoracic extension 2x 10 5 second holds Manual: L UPA grade II-III T4-T6, STM to L periscap musculature, IASTM too L periscap musculature Prone horizontal abduction 2x 10    PATIENT EDUCATION:  Education details: 10/26/21: reassessment findings, POC, HEP, cardiovascular fitness, other heath issues contribution to symptoms, beginning walking program, healthy eating habits with fitness for weight loss, Dry needling 09/15/21 HEP 09/13/21 updated HEP 07/28/21 Patient educated on exam findings, POC, scope of PT, HEP, f/u with MD regarding supraclavicular edema, and lymphatic system. 08/10/21 HEP Person educated: Patient Education method: Consulting civil engineer, Demonstration, and Handouts Education comprehension: verbalized understanding, returned demonstration, verbal cues required, and tactile cues required   HOME EXERCISE PROGRAM: Access Code: [email or web address] 09/22/21 - Seated Upper Trapezius Stretch  - 2-3 x daily - 7 x weekly - 1 sets - 3 reps - 20-30 second hold   Date: 09/15/2021 - Supine Shoulder External Rotation with Resistance  - 1 x daily - 7 x weekly - 3 sets - 10 reps - Prone Single Arm Shoulder Horizontal Abduction with Scapular Retraction and Palm Down  - 1 x daily - 7 x weekly - 2 sets  - 10 reps  Access Code: [email or web address] Date: 09/13/2021 - Supine Chin Tuck  - 1 x daily - 7 x weekly - 1 sets - 10 reps - Supine Scapular Retraction  - 1 x daily - 7 x weekly - 2 sets - 10 reps - Supine Cervical Rotation AROM on Pillow  - 1 x daily - 7 x weekly - 1 sets - 10 reps  07/28/21 begin next session 08/10/21 supine cervical retractions, self STM  ASSESSMENT:  CLINICAL IMPRESSION: Patient has met 2/2 short term goals and 2/5 long term goals with ability to complete HEP and improvement in symptoms, ROM activity tolerance, functional mobility and strength. He remains limited by continued symptoms in cervical/thoracic spine and L UT/perscap region. Spend session today on reassessment and education on various matters of overall health as seen above. Extending POC to continue to continue to promote improving strength/mobility and decrease symptoms. Possibly trial DN next session. Patient  will continue to benefit from physical therapy in order to improve function and reduce impairment.     OBJECTIVE IMPAIRMENTS decreased activity tolerance, decreased endurance, decreased mobility, decreased ROM, decreased strength, hypomobility, increased muscle spasms, impaired flexibility, improper body mechanics, postural dysfunction, obesity, and pain.   ACTIVITY LIMITATIONS carrying, lifting, bending, standing, squatting, sleeping, stairs, transfers, bed mobility, bathing, toileting, and reach over head  PARTICIPATION LIMITATIONS: meal prep, cleaning, laundry, driving, shopping, community activity, occupation, and yard work  PERSONAL FACTORS Fitness, Time since onset of injury/illness/exacerbation, and 3+ comorbidities: Allergies, Arthritis, Back pain, BMI over 30, Headaches, High Blood Pressure  are also affecting patient's functional outcome.   REHAB POTENTIAL: Good  CLINICAL DECISION MAKING: Evolving/moderate complexity  EVALUATION COMPLEXITY: Moderate   GOALS: Goals reviewed with patient?  Yes  SHORT TERM GOALS: Target date: 08/11/2021  Patient will be independent with HEP in order to improve functional outcomes. Baseline:  Goal status: MET  2.  Patient will report at least 25% improvement in symptoms for improved quality of life. Baseline:  Goal status: MET    LONG TERM GOALS: Target date: 10/04/21  Patient will report at least 75% improvement in symptoms for improved quality of life. Baseline:  Goal status: IN PROGRESS  2.  Patient will improve FOTO score by at least 5 points in order to indicate improved tolerance to activity. Baseline: 31% function; 09/12/21 33% 10/26/21: 46% function Goal status: MET  3.  Patient will demonstrate at least 25% improvement in cervical ROM in all restricted planes for improved ability to move head with chores. Baseline: see AROM Goal status: METparially  4.  Patient will be able to return to all activities unrestricted for improved ability to perform work functions and participate with family.  Baseline:  Goal status: IN PROGRESS  5.  Patient will demonstrate grade of 5/5 MMT grade in all tested musculature as evidence of improved strength to assist with lifting and dressing.  Baseline: see MMT Goal status: IN PROGRESS      PLAN: PT FREQUENCY: 2x/week  PT DURATION: 3 weeks  PLANNED INTERVENTIONS: Therapeutic exercises, Therapeutic activity, Neuromuscular re-education, Balance training, Gait training, Patient/Family education, Joint manipulation, Joint mobilization, Stair training, Orthotic/Fit training, DME instructions, Aquatic Therapy, Dry Needling, Electrical stimulation, Spinal manipulation, Spinal mobilization, Cryotherapy, Moist heat, Compression bandaging, scar mobilization, Splintting, Taping, Traction, Ultrasound, Ionotophoresis 61m/ml Dexamethasone, and Manual therapy  PLAN FOR NEXT SESSION:  periscap strength;  cervical/scap mobility, levator and UT stretches. Postural strength, shoulder strength, Possibly Trial DN  as patient interested   12:05 PM, 10/26/21 AMearl LatinPT, DPT Physical Therap[facility]

## 2021-10-27 ENCOUNTER — Ambulatory Visit (HOSPITAL_COMMUNITY): Payer: 59 | Admitting: Physical Therapy

## 2021-11-02 ENCOUNTER — Encounter (HOSPITAL_COMMUNITY): Payer: 59 | Admitting: Physical Therapy

## 2021-11-09 ENCOUNTER — Encounter (HOSPITAL_COMMUNITY): Payer: 59 | Admitting: Physical Therapy

## 2021-11-11 ENCOUNTER — Encounter (HOSPITAL_COMMUNITY): Payer: 59 | Admitting: Physical Therapy

## 2021-11-14 ENCOUNTER — Ambulatory Visit (HOSPITAL_COMMUNITY): Payer: 59 | Attending: Neurology | Admitting: Physical Therapy

## 2021-11-14 ENCOUNTER — Encounter (HOSPITAL_COMMUNITY): Payer: Self-pay | Admitting: Physical Therapy

## 2021-11-14 DIAGNOSIS — M25511 Pain in right shoulder: Secondary | ICD-10-CM | POA: Diagnosis not present

## 2021-11-14 DIAGNOSIS — M542 Cervicalgia: Secondary | ICD-10-CM | POA: Diagnosis not present

## 2021-11-14 DIAGNOSIS — R29898 Other symptoms and signs involving the musculoskeletal system: Secondary | ICD-10-CM | POA: Diagnosis not present

## 2021-11-14 DIAGNOSIS — R293 Abnormal posture: Secondary | ICD-10-CM | POA: Diagnosis not present

## 2021-11-14 NOTE — Therapy (Signed)
OUTPATIENT PHYSICAL THERAPY CERVICAL PROGRESS NOTE     Patient Name: Micheal Ross. MRN: [id number] DOB:07-28-1960, 61 y.o., male Today's Date: 11/14/2021  Progress Note   Reporting Period 09/13/21 to 10/26/21   See note below for Objective Data and Assessment of Progress/Goals    PT End of Session - 11/14/21 0820     Visit Number 10    Number of Visits 14    Date for PT Re-Evaluation 11/23/21    Authorization Type [employer] (30 visits combined PTOT, ST, 0 used) (no auth unless 30 visits needed)    Authorization Time Period will be transitioning to new insurance in September 2023 - check info    PT Start Time 0820    PT Stop Time 0859    PT Time Calculation (min) 39 min    Activity Tolerance Patient tolerated treatment well    Behavior During Therapy WFL for tasks assessed/performed                Past Medical History:  Diagnosis Date   Brain tumor ([place])    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)   Hypercholesterolemia    Hypertension    Neck pain    Shoulder pain    Sleep apnea    uses bipap at home   Past Surgical History:  Procedure Laterality Date   ANKLE SURGERY     brain tumor removal  2014   CARPAL TUNNEL RELEASE Left 08/15/2021   Procedure: LEFT CARPAL TUNNEL RELEASE;  Surgeon: Marybelle Killings, MD;  Location: [place];  Service: Orthopedics;  Laterality: Left;   COLONOSCOPY WITH PROPOFOL N/A 01/13/2019   Procedure: COLONOSCOPY WITH PROPOFOL;  Surgeon: Daneil Dolin, MD;  20 mm polyp found in the cecum that was removed via piecemeal hot snare polypectomy. Repeat in 4 months. path tubulovillous adenoma, negative for high-grade dysplasia   COLONOSCOPY WITH PROPOFOL N/A 01/20/2019   Procedure: COLONOSCOPY WITH PROPOFOL;  Surgeon: Danie Binder, MD; large defect (2 cm x 1.5 cm with small visible vessel with stigmata of recent bleed) in the cecum.  3 hemostatic clips placed (MR conditional).  One 3 mm polyp in the cecum, tortuous colon,  external and internal hemorrhoids.  Pathology revealed tubular adenoma.   COLONOSCOPY WITH PROPOFOL N/A 10/06/2021   Procedure: COLONOSCOPY WITH PROPOFOL;  Surgeon: [name], DO;  [facility];  Service: Endoscopy;  Laterality: N/A;  7:30am   HERNIA REPAIR  [id number]   umbilical   NASAL SEPTOPLASTY W/ TURBINOPLASTY Bilateral 11/30/2020   Procedure: NASAL SEPTOPLASTY WITH BILATERAL  TURBINATE REDUCTION;  Surgeon: Leta Baptist, MD;  Location: [place];  Service: ENT;  Laterality: Bilateral;   POLYPECTOMY  01/13/2019   Procedure: POLYPECTOMY;  Surgeon: Daneil Dolin, MD;  [facility];  Service: Endoscopy;;  Large Cecal Polyp    POLYPECTOMY  10/06/2021   Procedure: POLYPECTOMY;  Surgeon: [name], DO;  [facility];  Service: Endoscopy;;   Patient Active Problem List   Diagnosis Date Noted   Low back pain 10/13/2021   Carpal tunnel syndrome, left upper limb 04/28/2021   Bee sting 12/05/2019   Obstructive sleep apnea 03/31/2019   Acute blood loss anemia 01/20/2019   Tachycardia 01/20/2019   Lower GI bleed    Weakness of both lower extremities 01/15/2019   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   Rectal bleeding 01/15/2019   Gastroesophageal reflux disease 01/15/2019   Shortness of breath 01/15/2019   H/O  adenomatous polyp of colon 11/14/2018   Hypertension 03/29/2018   Hyperlipidemia 03/29/2018   Meningioma ([place]) 09/20/2012    PCP: [name] MD  REFERRING PROVIDER: Alric Ran, MD   REFERRING DIAG: [phone] (ICD-10-CM) - Cervical radiculopathy   THERAPY DIAG:  Cervicalgia  Abnormal posture  Other symptoms and signs involving the musculoskeletal system  Rationale for Evaluation and Treatment Rehabilitation  ONSET DATE: November 2021  SUBJECTIVE:                                                                                                                                                                                                          SUBJECTIVE STATEMENT: Patient states nothing new. Neck still isn't good. States worse pain over the last week. Been using hemp on neck which helps him sleep. Has been doing exercises daily.  PERTINENT HISTORY:  hypertension, hyperlipidemia, obesity, history of left frontal meningioma that was resected   PAIN:  Are you having pain? No and Yes: NPRS scale: 7/10 Pain location: LT trap, neck   Pain description: sharp Aggravating factors: pressure, movement Relieving factors: ice  PRECAUTIONS: None  WEIGHT BEARING RESTRICTIONS No  FALLS:  Has patient fallen in last 6 months? Yes. Number of falls 1  LIVING ENVIRONMENT: Lives with: lives with their spouse Lives in: House/apartment Stairs: Yes: External: 6 steps; on right going up, on left going up, and can reach both Has following equipment at home: shower chair  OCCUPATION: applied for disability  PLOF: Independent  PATIENT GOALS be able to stop pain and move better  OBJECTIVE:   DIAGNOSTIC FINDINGS:  CLINICAL DATA:  61 year old male with bilateral neck swelling left greater than right. Symptoms reportedly for 1.5 years. Nonpainful. Dizziness.    FINDINGS: Pharynx and larynx: Mild motion artifact, including at the glottis and supraglottic larynx. Mild postinflammatory calcifications of the palatine tonsils. Overall the pharyngeal and laryngeal soft tissue contours are within normal limits. Parapharyngeal and retropharyngeal spaces appear negative.   Salivary glands: Negative.  Negative sublingual space.   Thyroid: Negative.   Lymph nodes: No cervical lymphadenopathy. Bilateral lymph nodes are subcentimeter, fairly symmetric and within normal limits. No hyperenhancing, cystic, or necrotic lymph nodes identified.   No neck mass or discrete soft tissue inflammation identified.   Vascular: Suboptimal intravascular contrast bolus. Grossly patent major vascular structures in the neck and at the  skull base. No significant calcified carotid atherosclerosis.   Limited intracranial: Negative.   Visualized orbits: Negative.   Mastoids and visualized paranasal sinuses: Small bilateral maxillary sinus mucous retention cysts, otherwise clear.  Skeleton: Elongated bilateral stylohyoid ligament calcification. Widespread advanced cervical spine degeneration, primarily disc and endplate degeneration. Up to mild associated cervical spinal stenosis. No acute osseous abnormality identified.   Upper chest: Negative visible trachea. Upper lungs are clear. Mild mediastinal lipomatosis. No superior mediastinal mass or lymphadenopathy.   IMPRESSION: 1. Essentially negative Neck CT; no mass or lymphadenopathy. 2. 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 3. Advanced cervical spine degeneration.  MRI 10/22 IMPRESSION: 1. 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 2. Mild left foraminal narrowing at C2-3. 3. Moderate right and mild left foraminal narrowing at C3-4. 4. Moderate foraminal narrowing bilaterally at C4-5 and C5-6 is worse on the right. 5. Mild right foraminal narrowing at C6-7. 6. Moderate foraminal narrowing bilaterally at C7-T1 is worse on the left.  PATIENT SURVEYS:  FOTO 31% function using back scale as uploaded for neck/shoulder  8/23: 46% function using back scale as uploaded for neck/shoulder  Observation:slight edema in L supraclavicular region and periscap region  COGNITION: Overall cognitive status: Within functional limits for tasks assessed   SENSATION: WFL  POSTURE: rounded shoulders, forward head, and increased thoracic kyphosis  PALPATION: TTP L middle trap, rhomboids, levator scap, UT, infraspinatus, scalene; edema in supraclavicular region L>R, L posterior superior scapular region 10/26/21: TTP L UT, L rhomboids/middle trap  CERVICAL ROM:   Active ROM AROM  eval AROM 09/13/21  AROM 10/26/21  Flexion 25% limited* full 0% limited  Extension 25% limited* Limited 25% 25% limited  Right lateral flexion 0% limited 30% limited 10% limited  Left lateral flexion 25% limited* *25% limited 0% limited  Right rotation 0% limited 25% limited 0% limited  Left rotation 25% limited* *50% limited 0% limited   (Blank rows = not tested) * pain  UPPER EXTREMITY ROM: [facility]   Active ROM Right eval Left eval  Shoulder flexion    Shoulder extension    Shoulder abduction    Shoulder adduction    Shoulder extension    Shoulder internal rotation    Shoulder external rotation    Elbow flexion    Elbow extension    Wrist flexion    Wrist extension    Wrist ulnar deviation    Wrist radial deviation    Wrist pronation    Wrist supination     (Blank rows = not tested) *pain  UPPER EXTREMITY MMT:  MMT Right eval Left eval Right 09/13/21 Left 09/13/21 Right 10/26/21 Left  10/26/21  Shoulder flexion 4+/5 4/5* 5 4* 5 4+  Shoulder extension        Shoulder abduction 4+/5 4/5* 5 4+* 5 4+*  Shoulder adduction        Shoulder extension        Shoulder internal rotation 5/5 5/5      Shoulder external rotation 5/5 5/5      Middle trapezius        Lower trapezius        Elbow flexion 5/5 5/5      Elbow extension 5/5 5/5      Wrist flexion        Wrist extension        Wrist ulnar deviation        Wrist radial deviation        Wrist pronation        Wrist supination        Grip strength WFL WFL       (Blank rows = not tested) *pain  TODAY'S TREATMENT:  11/14/21 TTP L UT Trigger Point Dry-Needling  Treatment instructions: Expect mild to moderate muscle soreness. S/S of pneumothorax if dry needled over a lung field, and to seek immediate medical attention should they occur. Patient verbalized understanding of these instructions and education.  Patient Consent Given: Yes Education handout provided: Yes Muscles treated: L UT Electrical stimulation performed:  No Parameters: N/A Treatment response/outcome: decrease in tissue tension, twitch response elicited STM to L UT before during and after dry needling for trigger point assessment and relaxation Seated L UT stretch 5x 20 second holds UBE 3 retro/3 forward Shoulder rolls x 20  Row BTB x 20   10/26/21 Reassessment Education   09/29/21 UBE 3/3  Upper trap stretch 3 x 20" Levator stretch 3 x 20"  Chin tuck 15 x 3" Cervical 3D excursion x10 each  IASTM using thera gun Lv 10 to LT upper trap, levator, rhomboid and posterior deltoid (patient seated)   09/26/21 UBE lv1 3/3  Band rows BTB 3 x10 Shoulder extension BTB 3 x 10 Shoulder ER BTB 2 x 10 Shoulder IR BTB 2 x10  High row BTB 2 x 10 Lat pull down 5 plates 2 x 10  Seated upper trap stretch 2 x 30"   IASTM using thera gun Lv 10 to LT upper trap, levator, rhomboid and posterior deltoid (patient seated)   09/22/21 Seated shin tuck (cues to avoid flexion) x10 Seated upper trap stretch 3 x 20"  Band rows BTB 3 x10 Shoulder extension BTB 3 x 10 Shoulder ER BTB 2 x 10 Shoulder IR BTB 2 x10   IASTM using thera gun Lv 10 to LT upper trap, levator, rhomboid and posterior deltoid (patient seated)     09/15/21 Supine scap retractions 2x 10  Supine scap ret with GH ER BTB 2x 10  Supine horizontal abduction BTB 2x 10  Seated thoracic extension 2x 10 5 second holds Manual: L UPA grade II-III T4-T6, STM to L periscap musculature, IASTM too L periscap musculature Prone horizontal abduction 2x 10    PATIENT EDUCATION:  Education details: 10/26/21: reassessment findings, POC, HEP, cardiovascular fitness, other heath issues contribution to symptoms, beginning walking program, healthy eating habits with fitness for weight loss, Dry needling 09/15/21 HEP 09/13/21 updated HEP 07/28/21 Patient educated on exam findings, POC, scope of PT, HEP, f/u with MD regarding supraclavicular edema, and lymphatic system. 08/10/21 HEP Person educated:  Patient Education method: Consulting civil engineer, Demonstration, and Handouts Education comprehension: verbalized understanding, returned demonstration, verbal cues required, and tactile cues required   HOME EXERCISE PROGRAM: Access Code: [email or web address] 09/22/21 - Seated Upper Trapezius Stretch  - 2-3 x daily - 7 x weekly - 1 sets - 3 reps - 20-30 second hold   Date: 09/15/2021 - Supine Shoulder External Rotation with Resistance  - 1 x daily - 7 x weekly - 3 sets - 10 reps - Prone Single Arm Shoulder Horizontal Abduction with Scapular Retraction and Palm Down  - 1 x daily - 7 x weekly - 2 sets - 10 reps  Access Code: [email or web address] Date: 09/13/2021 - Supine Chin Tuck  - 1 x daily - 7 x weekly - 1 sets - 10 reps - Supine Scapular Retraction  - 1 x daily - 7 x weekly - 2 sets - 10 reps - Supine Cervical Rotation AROM on Pillow  - 1 x daily - 7 x weekly - 1 sets - 10 reps  07/28/21 begin next session 08/10/21 supine cervical retractions, self STM  ASSESSMENT:  CLINICAL IMPRESSION: Patient educated on dry needling possible benefits and reactions. Performed dry needling to L UT with twitch response elicited and decrease in tissue tension noted. Patient stating decrease in symptoms. Continued with cervical mobility and postural strengthening which is tolerated well. Patient will continue to benefit from physical therapy in order to improve function and reduce impairment.     OBJECTIVE IMPAIRMENTS decreased activity tolerance, decreased endurance, decreased mobility, decreased ROM, decreased strength, hypomobility, increased muscle spasms, impaired flexibility, improper body mechanics, postural dysfunction, obesity, and pain.   ACTIVITY LIMITATIONS carrying, lifting, bending, standing, squatting, sleeping, stairs, transfers, bed mobility, bathing, toileting, and reach over head  PARTICIPATION LIMITATIONS: meal prep, cleaning, laundry, driving, shopping, community activity, occupation, and yard work  PERSONAL  FACTORS Fitness, Time since onset of injury/illness/exacerbation, and 3+ comorbidities: Allergies, Arthritis, Back pain, BMI over 30, Headaches, High Blood Pressure  are also affecting patient's functional outcome.   REHAB POTENTIAL: Good  CLINICAL DECISION MAKING: Evolving/moderate complexity  EVALUATION COMPLEXITY: Moderate   GOALS: Goals reviewed with patient? Yes  SHORT TERM GOALS: Target date: 08/11/2021  Patient will be independent with HEP in order to improve functional outcomes. Baseline:  Goal status: MET  2.  Patient will report at least 25% improvement in symptoms for improved quality of life. Baseline:  Goal status: MET    LONG TERM GOALS: Target date: 10/04/21  Patient will report at least 75% improvement in symptoms for improved quality of life. Baseline:  Goal status: IN PROGRESS  2.  Patient will improve FOTO score by at least 5 points in order to indicate improved tolerance to activity. Baseline: 31% function; 09/12/21 33% 10/26/21: 46% function Goal status: MET  3.  Patient will demonstrate at least 25% improvement in cervical ROM in all restricted planes for improved ability to move head with chores. Baseline: see AROM Goal status: METparially  4.  Patient will be able to return to all activities unrestricted for improved ability to perform work functions and participate with family.  Baseline:  Goal status: IN PROGRESS  5.  Patient will demonstrate grade of 5/5 MMT grade in all tested musculature as evidence of improved strength to assist with lifting and dressing.  Baseline: see MMT Goal status: IN PROGRESS      PLAN: PT FREQUENCY: 2x/week  PT DURATION: 3 weeks  PLANNED INTERVENTIONS: Therapeutic exercises, Therapeutic activity, Neuromuscular re-education, Balance training, Gait training, Patient/Family education, Joint manipulation, Joint mobilization, Stair training, Orthotic/Fit training, DME instructions, Aquatic Therapy, Dry Needling,  Electrical stimulation, Spinal manipulation, Spinal mobilization, Cryotherapy, Moist heat, Compression bandaging, scar mobilization, Splintting, Taping, Traction, Ultrasound, Ionotophoresis 99m/ml Dexamethasone, and Manual therapy  PLAN FOR NEXT SESSION:  periscap strength;  cervical/scap mobility, levator and UT stretches. Postural strength, shoulder strength, Possibly continue dry needling  8:21 AM, 11/14/21 AMearl LatinPT, DPT [facility]

## 2021-11-14 NOTE — Patient Instructions (Signed)

## 2021-11-15 ENCOUNTER — Telehealth: Payer: Self-pay | Admitting: Cardiology

## 2021-11-15 NOTE — Telephone Encounter (Signed)
Pt c/o BP issue: STAT if pt c/o blurred vision, one-sided weakness or slurred speech  1. What are your last 5 BP readings?  124/72 121/68 121/76 117/83 121/82  2. Are you having any other symptoms (ex. Dizziness, headache, blurred vision, passed out)? Always has a little bit of dizziness, he is going to see neurology for it..    3. What is your BP issue? He is concerned that his BP is low.  The 121/68 has him worried.

## 2021-11-16 ENCOUNTER — Encounter (HOSPITAL_COMMUNITY): Payer: 59 | Admitting: Physical Therapy

## 2021-11-16 DIAGNOSIS — R208 Other disturbances of skin sensation: Secondary | ICD-10-CM | POA: Diagnosis not present

## 2021-11-16 DIAGNOSIS — M5416 Radiculopathy, lumbar region: Secondary | ICD-10-CM | POA: Diagnosis not present

## 2021-11-16 DIAGNOSIS — R42 Dizziness and giddiness: Secondary | ICD-10-CM | POA: Diagnosis not present

## 2021-11-16 DIAGNOSIS — M545 Low back pain, unspecified: Secondary | ICD-10-CM | POA: Diagnosis not present

## 2021-11-16 DIAGNOSIS — M79604 Pain in right leg: Secondary | ICD-10-CM | POA: Diagnosis not present

## 2021-11-16 NOTE — Telephone Encounter (Signed)
Patient made aware that BP readings are fine per JB, voiced understanding.

## 2021-11-16 NOTE — Telephone Encounter (Signed)
BPs are great. Unless top number is in the 90s or bottom number is in the 40s would not be a concern of being too low  Zandra Abts MD

## 2021-11-17 ENCOUNTER — Encounter (HOSPITAL_COMMUNITY): Payer: Self-pay | Admitting: Physical Therapy

## 2021-11-17 ENCOUNTER — Ambulatory Visit (HOSPITAL_COMMUNITY): Payer: 59 | Admitting: Physical Therapy

## 2021-11-17 DIAGNOSIS — R29898 Other symptoms and signs involving the musculoskeletal system: Secondary | ICD-10-CM | POA: Diagnosis not present

## 2021-11-17 DIAGNOSIS — M542 Cervicalgia: Secondary | ICD-10-CM

## 2021-11-17 DIAGNOSIS — R293 Abnormal posture: Secondary | ICD-10-CM

## 2021-11-17 DIAGNOSIS — M25511 Pain in right shoulder: Secondary | ICD-10-CM

## 2021-11-17 NOTE — Therapy (Signed)
OUTPATIENT PHYSICAL THERAPY CERVICAL PROGRESS NOTE     Patient Name: Micheal Ross. MRN: [id number] DOB:08/19/1960, 61 y.o., male Today's Date: 11/17/2021   See note below for Objective Data and Assessment of Progress/Goals    PT End of Session - 11/17/21 0815     Visit Number 11    Number of Visits 14    Date for PT Re-Evaluation 11/23/21    Authorization Type [employer]    PT Start Time 0815    PT Stop Time 0858    PT Time Calculation (min) 43 min    Activity Tolerance Patient tolerated treatment well    Behavior During Therapy WFL for tasks assessed/performed                Past Medical History:  Diagnosis Date   Brain tumor ([place])    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)   Hypercholesterolemia    Hypertension    Neck pain    Shoulder pain    Sleep apnea    uses bipap at home   Past Surgical History:  Procedure Laterality Date   ANKLE SURGERY     brain tumor removal  2014   CARPAL TUNNEL RELEASE Left 08/15/2021   Procedure: LEFT CARPAL TUNNEL RELEASE;  Surgeon: Marybelle Killings, MD;  Location: [place];  Service: Orthopedics;  Laterality: Left;   COLONOSCOPY WITH PROPOFOL N/A 01/13/2019   Procedure: COLONOSCOPY WITH PROPOFOL;  Surgeon: Daneil Dolin, MD;  20 mm polyp found in the cecum that was removed via piecemeal hot snare polypectomy. Repeat in 4 months. path tubulovillous adenoma, negative for high-grade dysplasia   COLONOSCOPY WITH PROPOFOL N/A 01/20/2019   Procedure: COLONOSCOPY WITH PROPOFOL;  Surgeon: Danie Binder, MD; large defect (2 cm x 1.5 cm with small visible vessel with stigmata of recent bleed) in the cecum.  3 hemostatic clips placed (MR conditional).  One 3 mm polyp in the cecum, tortuous colon, external and internal hemorrhoids.  Pathology revealed tubular adenoma.   COLONOSCOPY WITH PROPOFOL N/A 10/06/2021   Procedure: COLONOSCOPY WITH PROPOFOL;  Surgeon: [name], DO;  [facility];  Service: Endoscopy;   Laterality: N/A;  7:30am   HERNIA REPAIR  [id number]   umbilical   NASAL SEPTOPLASTY W/ TURBINOPLASTY Bilateral 11/30/2020   Procedure: NASAL SEPTOPLASTY WITH BILATERAL  TURBINATE REDUCTION;  Surgeon: Leta Baptist, MD;  Location: [place];  Service: ENT;  Laterality: Bilateral;   POLYPECTOMY  01/13/2019   Procedure: POLYPECTOMY;  Surgeon: Daneil Dolin, MD;  [facility];  Service: Endoscopy;;  Large Cecal Polyp    POLYPECTOMY  10/06/2021   Procedure: POLYPECTOMY;  Surgeon: [name], DO;  [facility];  Service: Endoscopy;;   Patient Active Problem List   Diagnosis Date Noted   Low back pain 10/13/2021   Carpal tunnel syndrome, left upper limb 04/28/2021   Bee sting 12/05/2019   Obstructive sleep apnea 03/31/2019   Acute blood loss anemia 01/20/2019   Tachycardia 01/20/2019   Lower GI bleed    Weakness of both lower extremities 01/15/2019   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   Rectal bleeding 01/15/2019   Gastroesophageal reflux disease 01/15/2019   Shortness of breath 01/15/2019   H/O adenomatous polyp of colon 11/14/2018   Hypertension 03/29/2018   Hyperlipidemia 03/29/2018   Meningioma ([place]) 09/20/2012    PCP: [name] MD  REFERRING PROVIDER: Alric Ran, MD   REFERRING DIAG: M54.12 (ICD-10-CM) - Cervical radiculopathy  THERAPY DIAG:  Cervicalgia  Abnormal posture  Other symptoms and signs involving the musculoskeletal system  Acute pain of right shoulder  Neck pain  Rationale for Evaluation and Treatment Rehabilitation  ONSET DATE: November 2021  SUBJECTIVE:                                                                                                                                                                                                         SUBJECTIVE STATEMENT: Feels DN was helpful. Still has pain in LT shoulder and neck. Has some appointments ongoing for back and his leg, may be coming to therapy for  this later on. Also reports some issues with vertigo, but his specialist would like to address "one thing at a time"  PERTINENT HISTORY:  hypertension, hyperlipidemia, obesity, history of left frontal meningioma that was resected   PAIN:  Are you having pain? No and Yes: NPRS scale: 6/10 Pain location: LT trap, neck   Pain description: sharp Aggravating factors: pressure, movement Relieving factors: ice  PRECAUTIONS: None  WEIGHT BEARING RESTRICTIONS No  FALLS:  Has patient fallen in last 6 months? Yes. Number of falls 1  LIVING ENVIRONMENT: Lives with: lives with their spouse Lives in: House/apartment Stairs: Yes: External: 6 steps; on right going up, on left going up, and can reach both Has following equipment at home: shower chair  OCCUPATION: applied for disability  PLOF: Independent  PATIENT GOALS be able to stop pain and move better  OBJECTIVE:   DIAGNOSTIC FINDINGS:  CLINICAL DATA:  61 year old male with bilateral neck swelling left greater than right. Symptoms reportedly for 1.5 years. Nonpainful. Dizziness.    FINDINGS: Pharynx and larynx: Mild motion artifact, including at the glottis and supraglottic larynx. Mild postinflammatory calcifications of the palatine tonsils. Overall the pharyngeal and laryngeal soft tissue contours are within normal limits. Parapharyngeal and retropharyngeal spaces appear negative.   Salivary glands: Negative.  Negative sublingual space.   Thyroid: Negative.   Lymph nodes: No cervical lymphadenopathy. Bilateral lymph nodes are subcentimeter, fairly symmetric and within normal limits. No hyperenhancing, cystic, or necrotic lymph nodes identified.   No neck mass or discrete soft tissue inflammation identified.   Vascular: Suboptimal intravascular contrast bolus. Grossly patent major vascular structures in the neck and at the skull base. No significant calcified carotid atherosclerosis.   Limited intracranial: Negative.    Visualized orbits: Negative.   Mastoids and visualized paranasal sinuses: Small bilateral maxillary sinus mucous retention cysts, otherwise clear.   Skeleton: Elongated bilateral stylohyoid ligament calcification. Widespread advanced cervical spine degeneration, primarily disc  and endplate degeneration. Up to mild associated cervical spinal stenosis. No acute osseous abnormality identified.   Upper chest: Negative visible trachea. Upper lungs are clear. Mild mediastinal lipomatosis. No superior mediastinal mass or lymphadenopathy.   IMPRESSION: 1. Essentially negative Neck CT; no mass or lymphadenopathy. 2. 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 3. Advanced cervical spine degeneration.  MRI 10/22 IMPRESSION: 1. 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 2. Mild left foraminal narrowing at C2-3. 3. Moderate right and mild left foraminal narrowing at C3-4. 4. Moderate foraminal narrowing bilaterally at C4-5 and C5-6 is worse on the right. 5. Mild right foraminal narrowing at C6-7. 6. Moderate foraminal narrowing bilaterally at C7-T1 is worse on the left.  PATIENT SURVEYS:  FOTO 31% function using back scale as uploaded for neck/shoulder  8/23: 46% function using back scale as uploaded for neck/shoulder  Observation:slight edema in L supraclavicular region and periscap region  COGNITION: Overall cognitive status: Within functional limits for tasks assessed   SENSATION: WFL  POSTURE: rounded shoulders, forward head, and increased thoracic kyphosis  PALPATION: TTP L middle trap, rhomboids, levator scap, UT, infraspinatus, scalene; edema in supraclavicular region L>R, L posterior superior scapular region 10/26/21: TTP L UT, L rhomboids/middle trap  CERVICAL ROM:   Active ROM AROM  eval AROM 09/13/21 AROM 10/26/21  Flexion 25% limited* full 0% limited  Extension 25% limited* Limited 25% 25% limited   Right lateral flexion 0% limited 30% limited 10% limited  Left lateral flexion 25% limited* *25% limited 0% limited  Right rotation 0% limited 25% limited 0% limited  Left rotation 25% limited* *50% limited 0% limited   (Blank rows = not tested) * pain   UPPER EXTREMITY ROM: [facility]   Active ROM Right eval Left eval  Shoulder flexion    Shoulder extension    Shoulder abduction    Shoulder adduction    Shoulder extension    Shoulder internal rotation    Shoulder external rotation    Elbow flexion    Elbow extension    Wrist flexion    Wrist extension    Wrist ulnar deviation    Wrist radial deviation    Wrist pronation    Wrist supination     (Blank rows = not tested) *pain  UPPER EXTREMITY MMT:  MMT Right eval Left eval Right 09/13/21 Left 09/13/21 Right 10/26/21 Left  10/26/21  Shoulder flexion 4+/5 4/5* 5 4* 5 4+  Shoulder extension        Shoulder abduction 4+/5 4/5* 5 4+* 5 4+*  Shoulder adduction        Shoulder extension        Shoulder internal rotation 5/5 5/5      Shoulder external rotation 5/5 5/5      Middle trapezius        Lower trapezius        Elbow flexion 5/5 5/5      Elbow extension 5/5 5/5      Wrist flexion        Wrist extension        Wrist ulnar deviation        Wrist radial deviation        Wrist pronation        Wrist supination        Grip strength WFL WFL       (Blank rows = not tested) *pain      TODAY'S TREATMENT:  11/17/21 Manual STM to LT  upper trap and LT rhomboid pre and post dry needling for trigger point identification and surface area preparation   Trigger Point Dry-Needling  Treatment instructions: Expect mild to moderate muscle soreness. S/S of pneumothorax if dry needled over a lung field, and to seek immediate medical attention should they occur. Patient verbalized understanding of these instructions and education.  Patient Consent Given: Yes Education handout provided: Previously provided Muscles treated: LT upper  trap, LT rhomboid  Electrical stimulation performed: No Parameters: N/A Treatment response/outcome: decrease in tissue tension, twitch response elicited   Seated LT upper trap stretch 5x 20 second holds Chin tuck x10 Cervical rotation x10 each  Shoulder rolls x 20    11/14/21 TTP L UT Trigger Point Dry-Needling  Treatment instructions: Expect mild to moderate muscle soreness. S/S of pneumothorax if dry needled over a lung field, and to seek immediate medical attention should they occur. Patient verbalized understanding of these instructions and education.   Patient Consent Given: Yes Education handout provided: Yes Muscles treated: L UT Electrical stimulation performed: No Parameters: N/A Treatment response/outcome: decrease in tissue tension, twitch response elicited STM to L UT before during and after dry needling for trigger point assessment and relaxation Seated L UT stretch 5x 20 second holds UBE 3 retro/3 forward Shoulder rolls x 20  Row BTB x 20     10/26/21 Reassessment Education   09/29/21 UBE 3/3  Upper trap stretch 3 x 20" Levator stretch 3 x 20"  Chin tuck 15 x 3" Cervical 3D excursion x10 each  IASTM using thera gun Lv 10 to LT upper trap, levator, rhomboid and posterior deltoid (patient seated)     PATIENT EDUCATION:  Education details: 10/26/21: reassessment findings, POC, HEP, cardiovascular fitness, other heath issues contribution to symptoms, beginning walking program, healthy eating habits with fitness for weight loss, Dry needling 09/15/21 HEP 09/13/21 updated HEP 07/28/21 Patient educated on exam findings, POC, scope of PT, HEP, f/u with MD regarding supraclavicular edema, and lymphatic system. 08/10/21 HEP Person educated: Patient Education method: Consulting civil engineer, Demonstration, and Handouts Education comprehension: verbalized understanding, returned demonstration, verbal cues required, and tactile cues required   HOME EXERCISE PROGRAM: Access Code:  [email or web address] 09/22/21 - Seated Upper Trapezius Stretch  - 2-3 x daily - 7 x weekly - 1 sets - 3 reps - 20-30 second hold   Date: 09/15/2021 - Supine Shoulder External Rotation with Resistance  - 1 x daily - 7 x weekly - 3 sets - 10 reps - Prone Single Arm Shoulder Horizontal Abduction with Scapular Retraction and Palm Down  - 1 x daily - 7 x weekly - 2 sets - 10 reps  Access Code: [email or web address] Date: 09/13/2021 - Supine Chin Tuck  - 1 x daily - 7 x weekly - 1 sets - 10 reps - Supine Scapular Retraction  - 1 x daily - 7 x weekly - 2 sets - 10 reps - Supine Cervical Rotation AROM on Pillow  - 1 x daily - 7 x weekly - 1 sets - 10 reps  07/28/21 begin next session 08/10/21 supine cervical retractions, self STM  ASSESSMENT:  CLINICAL IMPRESSION: Continued working on improved posturing of cervical spine. Performed dry needling with good tolerance. Educated patient on purpose of current exercise plan and cervical spine anatomy. Answered all patient questions. Patient will continue to benefit from skilled therapy services to reduce remaining deficits and improve functional ability.   OBJECTIVE IMPAIRMENTS decreased activity tolerance, decreased endurance, decreased mobility, decreased ROM, decreased strength, hypomobility, increased muscle  spasms, impaired flexibility, improper body mechanics, postural dysfunction, obesity, and pain.   ACTIVITY LIMITATIONS carrying, lifting, bending, standing, squatting, sleeping, stairs, transfers, bed mobility, bathing, toileting, and reach over head  PARTICIPATION LIMITATIONS: meal prep, cleaning, laundry, driving, shopping, community activity, occupation, and yard work  PERSONAL FACTORS Fitness, Time since onset of injury/illness/exacerbation, and 3+ comorbidities: Allergies, Arthritis, Back pain, BMI over 30, Headaches, High Blood Pressure  are also affecting patient's functional outcome.   REHAB POTENTIAL: Good  CLINICAL DECISION MAKING: Evolving/moderate  complexity  EVALUATION COMPLEXITY: Moderate   GOALS: Goals reviewed with patient? Yes  SHORT TERM GOALS: Target date: 08/11/2021  Patient will be independent with HEP in order to improve functional outcomes. Baseline:  Goal status: MET  2.  Patient will report at least 25% improvement in symptoms for improved quality of life. Baseline:  Goal status: MET    LONG TERM GOALS: Target date: 10/04/21  Patient will report at least 75% improvement in symptoms for improved quality of life. Baseline:  Goal status: IN PROGRESS  2.  Patient will improve FOTO score by at least 5 points in order to indicate improved tolerance to activity. Baseline: 31% function; 09/12/21 33% 10/26/21: 46% function Goal status: MET  3.  Patient will demonstrate at least 25% improvement in cervical ROM in all restricted planes for improved ability to move head with chores. Baseline: see AROM Goal status: METparially  4.  Patient will be able to return to all activities unrestricted for improved ability to perform work functions and participate with family.  Baseline:  Goal status: IN PROGRESS  5.  Patient will demonstrate grade of 5/5 MMT grade in all tested musculature as evidence of improved strength to assist with lifting and dressing.  Baseline: see MMT Goal status: IN PROGRESS      PLAN: PT FREQUENCY: 2x/week  PT DURATION: 3 weeks  PLANNED INTERVENTIONS: Therapeutic exercises, Therapeutic activity, Neuromuscular re-education, Balance training, Gait training, Patient/Family education, Joint manipulation, Joint mobilization, Stair training, Orthotic/Fit training, DME instructions, Aquatic Therapy, Dry Needling, Electrical stimulation, Spinal manipulation, Spinal mobilization, Cryotherapy, Moist heat, Compression bandaging, scar mobilization, Splintting, Taping, Traction, Ultrasound, Ionotophoresis 19m/ml Dexamethasone, and Manual therapy  PLAN FOR NEXT SESSION:  periscap strength;  cervical/scap  mobility, levator and UT stretches. Postural strength, shoulder strength, Possibly continue dry needling  9:05 AM, 11/17/21 CJosue HectorPT DPT  Physical Therapist with [facility] ([phone]

## 2021-11-18 ENCOUNTER — Encounter (HOSPITAL_COMMUNITY): Payer: 59

## 2021-11-22 ENCOUNTER — Encounter (HOSPITAL_COMMUNITY): Payer: 59 | Admitting: Physical Therapy

## 2021-11-24 ENCOUNTER — Telehealth: Payer: Self-pay | Admitting: Neurology

## 2021-11-24 ENCOUNTER — Ambulatory Visit (HOSPITAL_COMMUNITY): Payer: 59 | Admitting: Physical Therapy

## 2021-11-24 ENCOUNTER — Other Ambulatory Visit: Payer: Self-pay | Admitting: Neurology

## 2021-11-24 DIAGNOSIS — M542 Cervicalgia: Secondary | ICD-10-CM | POA: Diagnosis not present

## 2021-11-24 DIAGNOSIS — R293 Abnormal posture: Secondary | ICD-10-CM | POA: Diagnosis not present

## 2021-11-24 DIAGNOSIS — R29898 Other symptoms and signs involving the musculoskeletal system: Secondary | ICD-10-CM | POA: Diagnosis not present

## 2021-11-24 DIAGNOSIS — M5412 Radiculopathy, cervical region: Secondary | ICD-10-CM

## 2021-11-24 DIAGNOSIS — M25511 Pain in right shoulder: Secondary | ICD-10-CM | POA: Diagnosis not present

## 2021-11-24 MED ORDER — PREGABALIN 300 MG PO CAPS: 300.0000 mg | ORAL_CAPSULE | Freq: Two times a day (BID) | ORAL | 3 refills | Status: DC

## 2021-11-24 NOTE — Progress Notes (Signed)
Pain not improved despite PR. We will increase his lyrica to 300 mg BID and order an EMG/NCS

## 2021-11-24 NOTE — Telephone Encounter (Signed)
Pt said, physical therapist cancelled appt due to there is no change. Instructed to see neurologist to figure out next step. Would like a call from the nurse.

## 2021-11-24 NOTE — Telephone Encounter (Signed)
Spoke to Micheal Ross States after 3 months of Micheal Ross he has seen little improvement, he is still in a lot of pain. Micheal Ross is still taking lyrica.  In you last note it mentions if he develops worsening weakness and pain, to contact Dr. Lorin Mercy for evaluation and possible surgical procedure. Is this your recommendation for Micheal Ross or would you like to see him sooner?

## 2021-11-24 NOTE — Therapy (Signed)
OUTPATIENT PHYSICAL THERAPY CERVICAL PROGRESS NOTE     Patient Name: Micheal Ross. MRN: [id number] DOB:[birth year], 61 y.o., male Today's Date: 11/24/2021 PHYSICAL THERAPY DISCHARGE SUMMARY  Visits from Start of Care: 12  Current functional level related to goals / functional outcomes: See below   Remaining deficits: See below   Education / Equipment: See assessment    Patient agrees to discharge. Patient goals were partially met. Patient is being discharged due to maximized rehab potential.    See note below for Objective Data and Assessment of Progress/Goals    PT End of Session - 11/24/21 0903     Visit Number 12    Number of Visits 14    Date for PT Re-Evaluation 11/24/21    Authorization Type [employer]    Authorization Time Period [employer]    PT Start Time 0902    PT Stop Time 0940    PT Time Calculation (min) 38 min    Activity Tolerance Patient tolerated treatment well    Behavior During Therapy WFL for tasks assessed/performed                Past Medical History:  Diagnosis Date   Brain tumor ([place])    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)   Hypercholesterolemia    Hypertension    Neck pain    Shoulder pain    Sleep apnea    uses bipap at home   Past Surgical History:  Procedure Laterality Date   ANKLE SURGERY     brain tumor removal  2014   CARPAL TUNNEL RELEASE Left 08/15/2021   Procedure: LEFT CARPAL TUNNEL RELEASE;  Surgeon: Marybelle Killings, MD;  Location: [facility];  Service: Orthopedics;  Laterality: Left;   COLONOSCOPY WITH PROPOFOL N/A 01/13/2019   Procedure: COLONOSCOPY WITH PROPOFOL;  Surgeon: Daneil Dolin, MD;  20 mm polyp found in the cecum that was removed via piecemeal hot snare polypectomy. Repeat in 4 months. path tubulovillous adenoma, negative for high-grade dysplasia   COLONOSCOPY WITH PROPOFOL N/A 01/20/2019   Procedure: COLONOSCOPY WITH PROPOFOL;  Surgeon: Danie Binder, MD; large defect (2 cm x 1.5 cm with  small visible vessel with stigmata of recent bleed) in the cecum.  3 hemostatic clips placed (MR conditional).  One 3 mm polyp in the cecum, tortuous colon, external and internal hemorrhoids.  Pathology revealed tubular adenoma.   COLONOSCOPY WITH PROPOFOL N/A 10/06/2021   Procedure: COLONOSCOPY WITH PROPOFOL;  Surgeon: [name], DO;  [facility];  Service: Endoscopy;  Laterality: N/A;  7:30am   HERNIA REPAIR  2376   umbilical   NASAL SEPTOPLASTY W/ TURBINOPLASTY Bilateral 11/30/2020   Procedure: NASAL SEPTOPLASTY WITH BILATERAL  TURBINATE REDUCTION;  Surgeon: Leta Baptist, MD;  Location: [facility];  Service: ENT;  Laterality: Bilateral;   POLYPECTOMY  01/13/2019   Procedure: POLYPECTOMY;  Surgeon: Daneil Dolin, MD;  [facility];  Service: Endoscopy;;  Large Cecal Polyp    POLYPECTOMY  10/06/2021   Procedure: POLYPECTOMY;  Surgeon: [name], DO;  [facility];  Service: Endoscopy;;   Patient Active Problem List   Diagnosis Date Noted   Low back pain 10/13/2021   Carpal tunnel syndrome, left upper limb 04/28/2021   Bee sting 12/05/2019   Obstructive sleep apnea 03/31/2019   Acute blood loss anemia 01/20/2019   Tachycardia 01/20/2019   Lower GI bleed    Weakness of both lower extremities 01/15/2019  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   Rectal bleeding 01/15/2019   Gastroesophageal reflux disease 01/15/2019   Shortness of breath 01/15/2019   H/O adenomatous polyp of colon 11/14/2018   Hypertension 03/29/2018   Hyperlipidemia 03/29/2018   Meningioma ([place]) 09/20/2012    PCP: [name] MD  REFERRING PROVIDER: Alric Ran, MD   REFERRING DIAG: [phone] (ICD-10-CM) - Cervical radiculopathy   THERAPY DIAG:  Cervicalgia  Abnormal posture  Other symptoms and signs involving the musculoskeletal system  Rationale for Evaluation and Treatment Rehabilitation  ONSET DATE: November 2021  SUBJECTIVE:                                                                                                                                                                                                          SUBJECTIVE STATEMENT: Patient reports pain in neck is about the same. He feels good for about a day after dry needling but pain returns sharp and fast.   PERTINENT HISTORY:  hypertension, hyperlipidemia, obesity, history of left frontal meningioma that was resected   PAIN:  Are you having pain? No and Yes: NPRS scale: 7/10 Pain location: LT trap, neck   Pain description: sharp Aggravating factors: pressure, movement Relieving factors: ice  PRECAUTIONS: None  WEIGHT BEARING RESTRICTIONS No  FALLS:  Has patient fallen in last 6 months? Yes. Number of falls 1  LIVING ENVIRONMENT: Lives with: lives with their spouse Lives in: House/apartment Stairs: Yes: External: 6 steps; on right going up, on left going up, and can reach both Has following equipment at home: shower chair  OCCUPATION: applied for disability  PLOF: Independent  PATIENT GOALS be able to stop pain and move better  OBJECTIVE:   DIAGNOSTIC FINDINGS:  CLINICAL DATA:  61 year old male with bilateral neck swelling left greater than right. Symptoms reportedly for 1.5 years. Nonpainful. Dizziness.    FINDINGS: Pharynx and larynx: Mild motion artifact, including at the glottis and supraglottic larynx. Mild postinflammatory calcifications of the palatine tonsils. Overall the pharyngeal and laryngeal soft tissue contours are within normal limits. Parapharyngeal and retropharyngeal spaces appear negative.   Salivary glands: Negative.  Negative sublingual space.   Thyroid: Negative.   Lymph nodes: No cervical lymphadenopathy. Bilateral lymph nodes are subcentimeter, fairly symmetric and within normal limits. No hyperenhancing, cystic, or necrotic lymph nodes identified.   No neck mass or discrete soft tissue inflammation identified.    Vascular: Suboptimal intravascular contrast bolus. Grossly patent major vascular structures in the neck and at the skull base. No significant calcified carotid atherosclerosis.   Limited intracranial: Negative.  Visualized orbits: Negative.   Mastoids and visualized paranasal sinuses: Small bilateral maxillary sinus mucous retention cysts, otherwise clear.   Skeleton: Elongated bilateral stylohyoid ligament calcification. Widespread advanced cervical spine degeneration, primarily disc and endplate degeneration. Up to mild associated cervical spinal stenosis. No acute osseous abnormality identified.   Upper chest: Negative visible trachea. Upper lungs are clear. Mild mediastinal lipomatosis. No superior mediastinal mass or lymphadenopathy.   IMPRESSION: 1. Essentially negative Neck CT; no mass or lymphadenopathy. 2. 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 3. Advanced cervical spine degeneration.  MRI 10/22 IMPRESSION: 1. 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 2. Mild left foraminal narrowing at C2-3. 3. Moderate right and mild left foraminal narrowing at C3-4. 4. Moderate foraminal narrowing bilaterally at C4-5 and C5-6 is worse on the right. 5. Mild right foraminal narrowing at C6-7. 6. Moderate foraminal narrowing bilaterally at C7-T1 is worse on the left.  PATIENT SURVEYS:  FOTO 11/24/21: 40% function using back scale as uploaded for neck/shoulder; 31% function using back scale as uploaded for neck/shoulder  8/23: 46% function using back scale as uploaded for neck/shoulder  Observation:slight edema in L supraclavicular region and periscap region  COGNITION: Overall cognitive status: Within functional limits for tasks assessed   SENSATION: WFL  POSTURE: rounded shoulders, forward head, and increased thoracic kyphosis  PALPATION: TTP L middle trap, rhomboids, levator scap, UT, infraspinatus,  scalene; edema in supraclavicular region L>R, L posterior superior scapular region 10/26/21: TTP L UT, L rhomboids/middle trap  CERVICAL ROM:   Active ROM AROM  eval AROM 09/13/21 AROM 10/26/21 AROM 11/24/21  Flexion 25% limited* full 0% limited 25 deg   Extension 25% limited* Limited 25% 25% limited 30 deg   Right lateral flexion 0% limited 30% limited 10% limited   Left lateral flexion 25% limited* *25% limited 0% limited   Right rotation 0% limited 25% limited 0% limited 60 deg  Left rotation 25% limited* *50% limited 0% limited 30 deg   (Blank rows = not tested) * pain   UPPER EXTREMITY ROM: [facility]   Active ROM Right eval Left eval  Shoulder flexion    Shoulder extension    Shoulder abduction    Shoulder adduction    Shoulder extension    Shoulder internal rotation    Shoulder external rotation    Elbow flexion    Elbow extension    Wrist flexion    Wrist extension    Wrist ulnar deviation    Wrist radial deviation    Wrist pronation    Wrist supination     (Blank rows = not tested) *pain  UPPER EXTREMITY MMT:  MMT Right eval Left eval Right 09/13/21 Left 09/13/21 Right 10/26/21 Left  10/26/21 Right 11/24/21 Left 11/24/21  Shoulder flexion 4+/5 4/5* 5 4* 5 4+ 5 4+  Shoulder extension          Shoulder abduction 4+/5 4/5* 5 4+* 5 4+* 5 4+  Shoulder adduction          Shoulder extension          Shoulder internal rotation 5/5 5/_0 Shoulder external rotation 5/5 5/_1 Middle trapezius          Lower trapezius          Elbow flexion 5/5 5/5        Elbow extension 5/5 5/5        Wrist  flexion          Wrist extension          Wrist ulnar deviation          Wrist radial deviation          Wrist pronation          Wrist supination          Grip strength WFL WFL         (Blank rows = not tested) *pain      TODAY'S TREATMENT:  11/24/21 Reassess MMT FOTO ROM  Manual STM to LT upper trap pre and post dry needling for trigger point  identification and surface area preparation   Trigger Point Dry-Needling  Treatment instructions: Expect mild to moderate muscle soreness. S/S of pneumothorax if dry needled over a lung field, and to seek immediate medical attention should they occur. Patient verbalized understanding of these instructions and education.  Patient Consent Given: Yes Education handout provided: Previously provided Muscles treated: LT upper trap  Electrical stimulation performed: No Parameters: N/A Treatment response/outcome: decrease in tissue tension   11/17/21 Manual STM to LT upper trap and LT rhomboid pre and post dry needling for trigger point identification and surface area preparation   Trigger Point Dry-Needling  Treatment instructions: Expect mild to moderate muscle soreness. S/S of pneumothorax if dry needled over a lung field, and to seek immediate medical attention should they occur. Patient verbalized understanding of these instructions and education.  Patient Consent Given: Yes Education handout provided: Previously provided Muscles treated: LT upper trap, LT rhomboid  Electrical stimulation performed: No Parameters: N/A Treatment response/outcome: decrease in tissue tension, twitch response elicited   Seated LT upper trap stretch 5x 20 second holds Chin tuck x10 Cervical rotation x10 each  Shoulder rolls x 20    11/14/21 TTP L UT Trigger Point Dry-Needling  Treatment instructions: Expect mild to moderate muscle soreness. S/S of pneumothorax if dry needled over a lung field, and to seek immediate medical attention should they occur. Patient verbalized understanding of these instructions and education.   Patient Consent Given: Yes Education handout provided: Yes Muscles treated: L UT Electrical stimulation performed: No Parameters: N/A Treatment response/outcome: decrease in tissue tension, twitch response elicited STM to L UT before during and after dry needling for trigger point  assessment and relaxation Seated L UT stretch 5x 20 second holds UBE 3 retro/3 forward Shoulder rolls x 20  Row BTB x 20     PATIENT EDUCATION:  Education details: 10/26/21: reassessment findings, POC, HEP, cardiovascular fitness, other heath issues contribution to symptoms, beginning walking program, healthy eating habits with fitness for weight loss, Dry needling 09/15/21 HEP 09/13/21 updated HEP 07/28/21 Patient educated on exam findings, POC, scope of PT, HEP, f/u with MD regarding supraclavicular edema, and lymphatic system. 08/10/21 HEP Person educated: Patient Education method: Explanation, Demonstration, and Handouts Education comprehension: verbalized understanding, returned demonstration, verbal cues required, and tactile cues required   HOME EXERCISE PROGRAM:  Access Code: [email or web address] URL: [email or web address] Date: 11/24/2021 Prepared by: Josue Hector  Exercises - Seated Scapular Retraction  - 1-2 x daily - 5-7 x weekly - 1-2 sets - 10 reps - 5 sec hold - Seated Cervical Retraction  - 1-2 x daily - 5-7 x weekly - 1-2 sets - 10 reps - 5 sec hold - Seated Upper Trapezius Stretch  - 1-2 x daily - 5-7 x weekly - 1 sets - 3 reps - 30 sec hold - Gentle Levator Scapulae  Stretch  - 1-2 x daily - 5-7 x weekly - 1 sets - 3 reps - 30 sec hold - Standing Shoulder Row with Anchored Resistance  - 1-2 x daily - 5-7 x weekly - 1-2 sets - 10 reps - Shoulder extension with resistance - Neutral  - 1-2 x daily - 5-7 x weekly - 1-2 sets - 10 reps - Seated Shoulder Rolls  - 1-2 x daily - 5-7 x weekly - 1-2 sets - 10 reps  Access Code: [email or web address] 09/22/21 - Seated Upper Trapezius Stretch  - 2-3 x daily - 7 x weekly - 1 sets - 3 reps - 20-30 second hold   Date: 09/15/2021 - Supine Shoulder External Rotation with Resistance  - 1 x daily - 7 x weekly - 3 sets - 10 reps - Prone Single Arm Shoulder Horizontal Abduction with Scapular Retraction and Palm Down  - 1 x daily - 7 x weekly - 2  sets - 10 reps  Access Code: [email or web address] Date: 09/13/2021 - Supine Chin Tuck  - 1 x daily - 7 x weekly - 1 sets - 10 reps - Supine Scapular Retraction  - 1 x daily - 7 x weekly - 2 sets - 10 reps - Supine Cervical Rotation AROM on Pillow  - 1 x daily - 7 x weekly - 1 sets - 10 reps  07/28/21 begin next session 08/10/21 supine cervical retractions, self STM  ASSESSMENT:  CLINICAL IMPRESSION: Patient continues to be limited by pain. Minimal functional improvement demonstrated. At this time, patient has reached max benefit and recommend return to referring provider for further assessment. Reviewed HEP and answered all patient questions. Patient encouraged to follow up with therapy services with any further questions or concerns.  OBJECTIVE IMPAIRMENTS decreased activity tolerance, decreased endurance, decreased mobility, decreased ROM, decreased strength, hypomobility, increased muscle spasms, impaired flexibility, improper body mechanics, postural dysfunction, obesity, and pain.   ACTIVITY LIMITATIONS carrying, lifting, bending, standing, squatting, sleeping, stairs, transfers, bed mobility, bathing, toileting, and reach over head  PARTICIPATION LIMITATIONS: meal prep, cleaning, laundry, driving, shopping, community activity, occupation, and yard work  PERSONAL FACTORS Fitness, Time since onset of injury/illness/exacerbation, and 3+ comorbidities: Allergies, Arthritis, Back pain, BMI over 30, Headaches, High Blood Pressure  are also affecting patient's functional outcome.   REHAB POTENTIAL: Good  CLINICAL DECISION MAKING: Evolving/moderate complexity  EVALUATION COMPLEXITY: Moderate   GOALS: Goals reviewed with patient? Yes  SHORT TERM GOALS: Target date: 08/11/2021  Patient will be independent with HEP in order to improve functional outcomes. Baseline:  Goal status: MET  2.  Patient will report at least 25% improvement in symptoms for improved quality of life. Baseline:  Goal status:  MET    LONG TERM GOALS: Target date: 10/04/21  Patient will report at least 75% improvement in symptoms for improved quality of life. Baseline: 10% improved  Goal status: NOT MET  2.  Patient will improve FOTO score by at least 5 points in order to indicate improved tolerance to activity. Baseline: 40% on 11/24/21;31% function; 09/12/21 33% 10/26/21: 46% function Goal status: MET  3.  Patient will demonstrate at least 25% improvement in cervical ROM in all restricted planes for improved ability to move head with chores. Baseline: see AROM Goal status: MET partially  4.  Patient will be able to return to all activities unrestricted for improved ability to perform work functions and participate with family.  Baseline:  Goal status: NOT MET  5.  Patient will demonstrate grade of 5/5  MMT grade in all tested musculature as evidence of improved strength to assist with lifting and dressing.  Baseline: see MMT Goal status: Partially MET      PLAN: PT FREQUENCY: 2x/week  PT DURATION: 3 weeks  PLANNED INTERVENTIONS: Therapeutic exercises, Therapeutic activity, Neuromuscular re-education, Balance training, Gait training, Patient/Family education, Joint manipulation, Joint mobilization, Stair training, Orthotic/Fit training, DME instructions, Aquatic Therapy, Dry Needling, Electrical stimulation, Spinal manipulation, Spinal mobilization, Cryotherapy, Moist heat, Compression bandaging, scar mobilization, Splintting, Taping, Traction, Ultrasound, Ionotophoresis 48m/ml Dexamethasone, and Manual therapy  PLAN FOR NEXT SESSION:  DC to HEP   9:05 AM, 11/24/21 CJosue HectorPT DPT  Physical Therapist with [facility] ([phone]

## 2021-11-24 NOTE — Telephone Encounter (Signed)
I reviewed Dr. Lorin Mercy now, they are not planning surgery. Advised Micheal Ross to increase his Lyrica to 300 mg BID  and we will send Micheal Ross for NCS/EMG.   Thanks

## 2021-11-25 ENCOUNTER — Telehealth: Payer: Self-pay | Admitting: Neurology

## 2021-11-25 NOTE — Telephone Encounter (Signed)
Pt said prescription for Pregablin should be 150 mg twice a day. Would like a call from the nurse to confirm prescription has been corrected and sent to the pharmacy

## 2021-11-28 NOTE — Telephone Encounter (Signed)
I spoke with the patient and confirmed that pregabalin 150 mg had been sent as 180 capsules for 90 days. He verbalized understanding and expressed appreciation for the call.

## 2021-11-28 NOTE — Telephone Encounter (Signed)
Pt called and LVM stating that he would like to know what the next step due to the state informing him that they have not noticed any improvement in the last 3 months in his shoulder or neck. Please advise.

## 2021-11-28 NOTE — Telephone Encounter (Signed)
Pt was given instructions today. Please see phone note form 11/24/21  Alric Ran, MD to Me   [facility], [place]   11/24/21  8:56 PM Note I reviewed Dr. Lorin Mercy now, they are not planning surgery. Advised him to increase his Lyrica to 300 mg BID  and we will send him for NCS/EMG.    Thanks

## 2021-11-28 NOTE — Telephone Encounter (Signed)
Spoke to pt, Pt voiced understanding, he will increase Lyrica to 300 mg BID and will await NCS appt.

## 2021-11-29 ENCOUNTER — Encounter (HOSPITAL_COMMUNITY): Payer: 59 | Admitting: Physical Therapy

## 2021-11-29 ENCOUNTER — Telehealth: Payer: Self-pay | Admitting: Neurology

## 2021-11-29 NOTE — Telephone Encounter (Signed)
NCV/EMG order faxed to [facility] at [phone], they will call pt to schedule.

## 2021-12-01 ENCOUNTER — Encounter (HOSPITAL_COMMUNITY): Payer: 59 | Admitting: Physical Therapy

## 2021-12-05 ENCOUNTER — Telehealth: Payer: Self-pay | Admitting: Neurology

## 2021-12-05 NOTE — Telephone Encounter (Signed)
Micheal Ross could you follow up on this? Thanks!

## 2021-12-05 NOTE — Telephone Encounter (Signed)
Pregabalin 150 Mg Capsule was filled on 10/27/2021. 180 capsules for 90 days.  The medication will not be refilled at this time.

## 2021-12-05 NOTE — Telephone Encounter (Signed)
Pt Called. Stated he received a call from [facility] and they told him they didn't understand the referral and need more information. Pt is requesting a call-back.

## 2021-12-05 NOTE — Telephone Encounter (Signed)
Pt is calling. Requesting a refill on. pregabalin (LYRICA) 300 MG capsule. Requesting refill be sent to   [facility] [email or web address] [place], [place], [place] [address]  ~28.2 mi [phone]

## 2021-12-07 DIAGNOSIS — D329 Benign neoplasm of meninges, unspecified: Secondary | ICD-10-CM | POA: Diagnosis not present

## 2021-12-07 NOTE — Telephone Encounter (Signed)
EMG/NCS for cervical radiculopathy, bilateral upper extremities. Patient with continued neck pain and weakness. Completed 3 months of PT without improvement.

## 2021-12-07 NOTE — Telephone Encounter (Signed)
[facility] called the patient and said there is not enough info on this order and they didn't understand it. This is the only thing we have sent [facility] for this patient. Is there more info that can be added and resent?

## 2021-12-07 NOTE — Telephone Encounter (Signed)
I received a fax dated 12/02/21 that he is scheduled at [facility] on 12/20/21 at 2:30pm with Dr. Rolena Infante.  There is also notes on the NCS/EMG telephone note.

## 2021-12-09 ENCOUNTER — Encounter: Payer: Self-pay | Admitting: Pulmonary Disease

## 2021-12-09 ENCOUNTER — Ambulatory Visit: Payer: 59 | Admitting: Pulmonary Disease

## 2021-12-09 ENCOUNTER — Telehealth: Payer: Self-pay | Admitting: Cardiology

## 2021-12-09 VITALS — BP 138/76 | HR 66 | Temp 98.0°F | Ht 70.0 in | Wt 312.6 lb

## 2021-12-09 DIAGNOSIS — G4733 Obstructive sleep apnea (adult) (pediatric): Secondary | ICD-10-CM

## 2021-12-09 NOTE — Telephone Encounter (Signed)
Pt c/o BP issue: STAT if pt c/o blurred vision, one-sided weakness or slurred speech  1. What are your last 5 BP readings? 138/76 this morning, 140/83 yesterday  2. Are you having any other symptoms (ex. Dizziness, headache, blurred vision, passed out)? Dizziness  3. What is your BP issue? Patient states he was referred to a neurologist from his PCP due to the dizziness. He states his neurologist told him the dizziness was either from his BP or BP medication. Please advise. referred to neurology for dizziness.

## 2021-12-09 NOTE — Telephone Encounter (Signed)
Patient c/o dizziness.  States that his neurologist thinks it may be related to his blood pressure or his medication.  Medication list reviewed - most recent change was that his Lyrica was increased to '300mg'$  twice a day on 12/02/2021.  Was initially weighing 323lb, now weight is down to 312lb this am.,  No c/o chest pain or SOB.  Takes his medications around 5 am & takes his readings around 9 am.    BP readings below -   114/69  60 132/83  72 116/74  68 125/80  68 132/85  70 120/74  71 133/79  74 114/82  71 111/77  66 119/77  67 119/73  67

## 2021-12-09 NOTE — Progress Notes (Signed)
B dm  [facility]  Chief Complaint  Patient presents with   Consult    Sleep consult. Already on Bipap with [facility]     Past Surgical History:  He  has a past surgical history that includes brain tumor removal (2014); Ankle surgery; Hernia repair (2010); Colonoscopy with propofol (N/A, 01/13/2019); polypectomy (01/13/2019); Colonoscopy with propofol (N/A, 01/20/2019); Nasal septoplasty w/ turbinoplasty (Bilateral, 11/30/2020); Carpal tunnel release (Left, 08/15/2021); Colonoscopy with propofol (N/A, 10/06/2021); and polypectomy (10/06/2021).  Past Medical History:  Meningioma, HLD, HTN, Neck pain  Constitutional:  BP 138/76 (BP Location: Left Arm, Patient Position: Sitting)   Pulse 66   Temp 98 F (36.7 C) (Temporal)   Ht '5\' 10"'$  (1.778 m)   Wt (!) 312 lb 9.6 oz (141.8 kg)   SpO2 98% Comment: ra  BMI 44.85 kg/m   Brief Summary:  Micheal Mounsey. is a 61 y.o. male with obstructive sleep apnea.      Subjective:   He was having trouble with snoring and apnea.  Felt sleepy all the time.  Had sleep study in 2021 that showed very severe sleep apnea.  Started on Bipap.  This has helped.  Has full face mask.  He goes to sleep at 8 pm.  He falls asleep in an hour after watching TV.  He wakes up some times to use the bathroom.  He gets out of bed between 5 and 9 am.  He feels rested now in the morning.  He denies morning headache.  He does not use anything to help him fall sleep or stay awake.  He denies sleep walking, sleep talking, bruxism, or nightmares.  There is no history of restless legs.  He denies sleep hallucinations, sleep paralysis, or cataplexy.  The Epworth score is 10 out of 24.   Physical Exam:   Appearance - well kempt   ENMT - no sinus tenderness, no oral exudate, no LAN, Mallampati 4 airway, no stridor  Respiratory - equal breath sounds bilaterally, no wheezing or rales  CV - s1s2 regular rate and rhythm, no  murmurs  Ext - no clubbing, no edema  Skin - no rashes  Psych - normal mood and affect   Sleep Tests:  PSG 03/28/19 >> AHI 105.6, SpO2 low 69% Bipap 11/09/21 to 12/08/21 >> used on 30 of 30 nights with average 14 hts 36 min.  Average AHI 10.6 with Bipap 20/10 cm H2O  Social History:  He  reports that he quit smoking about 38 years ago. His smoking use included cigarettes. He has a 1.00 pack-year smoking history. He has never used smokeless tobacco. He reports that he does not currently use alcohol. He reports that he does not use drugs.  Family History:  His family history includes COPD in his father; Cancer in his cousin, maternal aunt, maternal uncle, and paternal uncle; Thyroid disease in his mother.     Assessment/Plan:   Obstructive sleep apnea. - he is compliant with Bipap and reports benefit from therapy - uses [facility] for his DME - will change his Bipap to 20/14 cm H2O and refit him for a nasal mask  Obesity. - discussed how weight can impact sleep and risk for sleep disordered breathing - discussed options to assist with weight loss: combination of diet modification, cardiovascular and strength training exercises  Cardiovascular risk. - had an extensive discussion regarding the adverse health consequences related to untreated sleep disordered breathing - specifically discussed the risks for hypertension,  coronary artery disease, cardiac dysrhythmias, cerebrovascular disease, and diabetes - lifestyle modification discussed  Safe driving practices. - discussed how sleep disruption can increase risk of accidents, particularly when driving - safe driving practices were discussed  Therapies for obstructive sleep apnea. - if the sleep study shows significant sleep apnea, then various therapies for treatment were reviewed: CPAP, oral appliance, and surgical interventions  Time Spent Involved in Patient Care on Day of Examination:  37 minutes  Follow up:   Patient  Instructions  Will have [facility] refit your Bipap mask and change your pressure setting to 20/14 cm water pressure  Follow up in 6 months  Medication List:   Allergies as of 12/09/2021       Reactions   Methocarbamol Other (See Comments)   Twitching in sleep   Pollen Extract Other (See Comments)   Stuffy nose/light headaches.        Medication List        Accurate as of December 09, 2021 10:04 AM. If you have any questions, ask your nurse or doctor.          acetaminophen 500 MG tablet Commonly known as: TYLENOL Take 500-1,000 mg by mouth every 6 (six) hours as needed (pain).   amLODipine 10 MG tablet Commonly known as: NORVASC Take 1 tablet (10 mg total) by mouth daily.   aspirin EC 81 MG tablet Take 81 mg by mouth once a week. Swallow whole.   cloNIDine 0.1 MG tablet Commonly known as: CATAPRES Take 0.1 mg by mouth 2 (two) times daily.   fluticasone 50 MCG/ACT nasal spray Commonly known as: FLONASE Place 2 sprays into both nostrils every other day.   lisinopril 40 MG tablet Commonly known as: ZESTRIL Take 40 mg by mouth daily at 2 am. (0300-0400)   One-A-Day Mens 50+ Advantage Tabs Take 1 tablet by mouth once a week.   pregabalin 300 MG capsule Commonly known as: Lyrica Take 1 capsule (300 mg total) by mouth 2 (two) times daily.   PROVENT HEMP PAIN RELIEF EX Apply 1 Application topically daily as needed (pain (neck/shoulders)).   rosuvastatin 5 MG tablet Commonly known as: CRESTOR Take 5 mg by mouth at bedtime.        Signature:  Chesley Mires, MD [place] Pager - [phone] 12/09/2021, 10:04 AM

## 2021-12-09 NOTE — Patient Instructions (Signed)
Will have [facility] refit your Bipap mask and change your pressure setting to 20/14 cm water pressure  Follow up in 6 months

## 2021-12-12 NOTE — Telephone Encounter (Signed)
BP's look fine, no low blood pressures. Does he stay well hydrated, at least 4 bottles of water daily? Caffeinated drinks would not count. Lyrica can cause dizziness in up to 45% of patients, did the symptoms start with the medication increase?  J Javeion Cannedy MD

## 2021-12-12 NOTE — Telephone Encounter (Signed)
Patient notified and verbalized understanding.  States that he had the dizziness prior to the Lyrica.  Neurologist also thinks this could be coming from pinched nerve in his neck OR medication.  Is going back for nerve test next week ordered by neurologist.  Informed patient that not sure if Dr. Harl Bowie can offer anymore suggestions at this point but am happy to send back to provider.

## 2021-12-13 NOTE — Telephone Encounter (Signed)
Patient notified and verbalized understanding. 

## 2021-12-13 NOTE — Telephone Encounter (Signed)
Dizziness is difficult to sort out because so many different things can cause it. I would wait on changing any meds until its more clear there are no other causes, lets see what his upcoming testing shows. DId we discuss his hydartion as mentioned in my initial message, dehydration common cause of dizziness.   Zandra Abts MD

## 2021-12-15 ENCOUNTER — Other Ambulatory Visit (HOSPITAL_COMMUNITY): Payer: Self-pay | Admitting: Neurology

## 2021-12-15 DIAGNOSIS — M5416 Radiculopathy, lumbar region: Secondary | ICD-10-CM

## 2021-12-20 DIAGNOSIS — M5412 Radiculopathy, cervical region: Secondary | ICD-10-CM | POA: Diagnosis not present

## 2021-12-20 DIAGNOSIS — Z6841 Body Mass Index (BMI) 40.0 and over, adult: Secondary | ICD-10-CM | POA: Diagnosis not present

## 2021-12-21 ENCOUNTER — Telehealth: Payer: Self-pay | Admitting: Pulmonary Disease

## 2021-12-21 DIAGNOSIS — G4733 Obstructive sleep apnea (adult) (pediatric): Secondary | ICD-10-CM

## 2021-12-21 NOTE — Telephone Encounter (Signed)
Pt called back stating he wanted the order to go to [facility] in [place].  Need printout of setting.  Fax:  [phone].  Pt had heard back from [facility] in [place] and stated it's too far to go.  Please advise.      Will route to PCC[facility]/ Taryn so that they can get new referral faxed over for patient. PCCs please advise. Thanks!

## 2021-12-21 NOTE — Telephone Encounter (Signed)
Patient wants to change DMEs from [facility] to [facility] for convenience of location.New order for DME change placed.

## 2021-12-21 NOTE — Telephone Encounter (Signed)
Pt called back stating he wanted the order to go to [facility] in [place].  Need printout of setting.  Fax:  [phone].  Pt had heard back from [facility] in [place] and stated it's too far to go.  Please advise.

## 2021-12-26 NOTE — Telephone Encounter (Signed)
Called and notified patient of response. He is going to follow up with insurance company. Nothing further needed for now.

## 2022-01-04 DIAGNOSIS — R059 Cough, unspecified: Secondary | ICD-10-CM | POA: Diagnosis not present

## 2022-01-05 ENCOUNTER — Other Ambulatory Visit: Payer: Self-pay | Admitting: Neurology

## 2022-01-10 ENCOUNTER — Encounter (HOSPITAL_COMMUNITY): Payer: Self-pay

## 2022-01-10 ENCOUNTER — Ambulatory Visit (HOSPITAL_COMMUNITY): Payer: 59

## 2022-01-11 DIAGNOSIS — R202 Paresthesia of skin: Secondary | ICD-10-CM | POA: Diagnosis not present

## 2022-01-11 DIAGNOSIS — G609 Hereditary and idiopathic neuropathy, unspecified: Secondary | ICD-10-CM | POA: Diagnosis not present

## 2022-01-11 DIAGNOSIS — M5412 Radiculopathy, cervical region: Secondary | ICD-10-CM | POA: Diagnosis not present

## 2022-01-12 ENCOUNTER — Telehealth: Payer: Self-pay | Admitting: Neurology

## 2022-01-12 MED ORDER — PREGABALIN 300 MG PO CAPS: 300.0000 mg | ORAL_CAPSULE | Freq: Two times a day (BID) | ORAL | 3 refills | Status: DC

## 2022-01-12 NOTE — Telephone Encounter (Signed)
Done. Thank you.

## 2022-01-12 NOTE — Telephone Encounter (Signed)
Pt reports that he never got his pregabalin (LYRICA) 300 MG capsule from [facility] [phone].  Pt has new insurance now and he is going to have to pay for the Rx thru [facility].  Please send the Rx  for pregabalin (LYRICA) 300 MG to   [facility]

## 2022-01-13 DIAGNOSIS — R208 Other disturbances of skin sensation: Secondary | ICD-10-CM | POA: Diagnosis not present

## 2022-01-13 DIAGNOSIS — M5416 Radiculopathy, lumbar region: Secondary | ICD-10-CM | POA: Diagnosis not present

## 2022-01-13 DIAGNOSIS — R42 Dizziness and giddiness: Secondary | ICD-10-CM | POA: Diagnosis not present

## 2022-01-13 DIAGNOSIS — M545 Low back pain, unspecified: Secondary | ICD-10-CM | POA: Diagnosis not present

## 2022-01-13 DIAGNOSIS — M79604 Pain in right leg: Secondary | ICD-10-CM | POA: Diagnosis not present

## 2022-01-16 ENCOUNTER — Ambulatory Visit: Payer: 59 | Admitting: Internal Medicine

## 2022-01-16 ENCOUNTER — Encounter: Payer: Self-pay | Admitting: Internal Medicine

## 2022-01-16 VITALS — BP 132/72 | HR 72 | Temp 98.1°F | Resp 18 | Ht 70.0 in | Wt 318.4 lb

## 2022-01-16 DIAGNOSIS — H1013 Acute atopic conjunctivitis, bilateral: Secondary | ICD-10-CM

## 2022-01-16 DIAGNOSIS — J302 Other seasonal allergic rhinitis: Secondary | ICD-10-CM

## 2022-01-16 DIAGNOSIS — R239 Unspecified skin changes: Secondary | ICD-10-CM

## 2022-01-16 DIAGNOSIS — T63481A Toxic effect of venom of other arthropod, accidental (unintentional), initial encounter: Secondary | ICD-10-CM | POA: Diagnosis not present

## 2022-01-16 DIAGNOSIS — J3089 Other allergic rhinitis: Secondary | ICD-10-CM | POA: Diagnosis not present

## 2022-01-16 MED ORDER — OLOPATADINE HCL 0.2 % OP SOLN: 1.0000 [drp] | Freq: Every day | OPHTHALMIC | 5 refills | Status: AC | PRN

## 2022-01-16 MED ORDER — CETIRIZINE HCL 10 MG PO TABS: 10.0000 mg | ORAL_TABLET | Freq: Every day | ORAL | 5 refills | Status: DC

## 2022-01-16 MED ORDER — AZELASTINE HCL 0.1 % NA SOLN: 1.0000 | Freq: Two times a day (BID) | NASAL | 5 refills | Status: DC

## 2022-01-16 MED ORDER — FLUTICASONE PROPIONATE 50 MCG/ACT NA SUSP: 2.0000 | Freq: Every day | NASAL | 5 refills | Status: DC

## 2022-01-16 NOTE — Patient Instructions (Addendum)
Return in about 6 weeks (around 02/27/2022).    Rhinitis: - Positive skin test to: trees, molds, tobacco leaf - Avoidance measures discussed. - Use nasal saline rinses before nose sprays such as with Neilmed Sinus Rinse bottle.  Use distilled water with the packets.  - Use Flonase 2 sprays each nostril daily. Aim upward and outward. - Use Azelastine 1 sprays each nostril twice daily. Aim upward and outward. - Use Zyrtec 10 mg daily as needed for runny nose or itchy watery eyes.   - For eyes, use Olopatadine or Ketotifen drops as needed for itchy watery eyes.  Available over the counter, if not covered by insurance. You can Systane lubricating eye drops as needed.   - Consider allergy shots as long term control of your symptoms by teaching your immune system to be more tolerant of your allergy triggers   Sting Reaction - If you have systemic effects from sting, please let us know. - No need for testing for venom allergy at this time.   ALLERGEN AVOIDANCE MEASURES  Molds - Indoor avoidance Use air conditioning to reduce indoor humidity.  Do not use a humidifier. Keep indoor humidity at 30 - 40%.  Use a dehumidifier if needed. In the bathroom use an exhaust fan or open a window after showering.  Wipe down damp surfaces after showering.  Clean bathrooms with a mold-killing solution (diluted bleach, or products like Tilex, etc) at least once a month. In the kitchen use an exhaust fan to remove steam from cooking.  Throw away spoiled foods immediately, and empty garbage daily.  Empty water pans below self-defrosting refrigerators frequently. Vent the clothes dryer to the outside. Limit indoor houseplants; mold grows in the dirt.  No houseplants in the bedroom. Remove carpet from the bedroom. Encase the mattress and box springs with a zippered encasing.  Molds - Outdoor avoidance Avoid being outside when the grass is being mowed, or the ground is tilled. Avoid playing in leaves, pine straw,  hay, etc.  Dead plant materials contain mold. Avoid going into barns or grain storage areas. Remove leaves, clippings and compost from around the home.  Trees Avoidance Pollen levels are highest during the mid-day and afternoon.  Consider this when planning outdoor activities. Avoid being outside when the grass is being mowed, or wear a mask if the pollen-allergic person must be the one to mow the grass. Keep the windows closed to keep pollen outside of the home. Use an air conditioner to filter the air. Take a shower, wash hair, and change clothing after working or playing outdoors during pollen season.

## 2022-01-16 NOTE — Progress Notes (Signed)
NEW PATIENT  Date of Service/Encounter:  01/16/22  Consult requested by: [name], MD   Subjective:   Micheal Ross. (DOB: [birth year]) is a 61 y.o. male who presents to the clinic on 01/16/2022 with a chief complaint of Allergy Testing (Head stays foggy with pressure ) .    History obtained from: chart review and patient.   Rhinitis:  Started about 3 years ago.  For the past 1 year, it has been worse.  Symptoms include:  sinus pressure, nasal congestion, rhinorrhea, post nasal drainage, sneezing, watery eyes, and itchy eyes  Occurs year-round Potential triggers: unsure, thinks pollen Treatments tried:  Flonase, Ipratropium. Stopped taking Flonase due to slight bleeding.   Previous allergy testing: no History of chronic sinusitis or sinus surgery: yes in Oct 2022. Dr. Yvette Rack did nasal septoplasty and turbinate reduction. He is supposed to see another ENT 02/16/2022.  He feels that after the surgery, he can't get the mucous out and feels like it is not draining.     Sting Reaction Reports he has been stung by bees before multiple times.  Previously would only have swelling but the last time, he did have hives with it.  No other systemic symptoms.    Past Medical History: Past Medical History:  Diagnosis Date   Brain tumor ([place])    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)   Hypercholesterolemia    Hypertension    Neck pain    Shoulder pain    Sleep apnea    uses bipap at home    Past Surgical History: Past Surgical History:  Procedure Laterality Date   ANKLE SURGERY     brain tumor removal  2014   CARPAL TUNNEL RELEASE Left 08/15/2021   Procedure: LEFT CARPAL TUNNEL RELEASE;  Surgeon: Marybelle Killings, MD;  Location: [facility];  Service: Orthopedics;  Laterality: Left;   COLONOSCOPY WITH PROPOFOL N/A 01/13/2019   Procedure: COLONOSCOPY WITH PROPOFOL;  Surgeon: Daneil Dolin, MD;  20 mm polyp found in the cecum that was removed via piecemeal hot snare  polypectomy. Repeat in 4 months. path tubulovillous adenoma, negative for high-grade dysplasia   COLONOSCOPY WITH PROPOFOL N/A 01/20/2019   Procedure: COLONOSCOPY WITH PROPOFOL;  Surgeon: Danie Binder, MD; large defect (2 cm x 1.5 cm with small visible vessel with stigmata of recent bleed) in the cecum.  3 hemostatic clips placed (MR conditional).  One 3 mm polyp in the cecum, tortuous colon, external and internal hemorrhoids.  Pathology revealed tubular adenoma.   COLONOSCOPY WITH PROPOFOL N/A 10/06/2021   Procedure: COLONOSCOPY WITH PROPOFOL;  Surgeon: [name], DO;  [facility];  Service: Endoscopy;  Laterality: N/A;  7:30am   HERNIA REPAIR  2423   umbilical   NASAL SEPTOPLASTY W/ TURBINOPLASTY Bilateral 11/30/2020   Procedure: NASAL SEPTOPLASTY WITH BILATERAL  TURBINATE REDUCTION;  Surgeon: Leta Baptist, MD;  Location: [facility];  Service: ENT;  Laterality: Bilateral;   POLYPECTOMY  01/13/2019   Procedure: POLYPECTOMY;  Surgeon: Daneil Dolin, MD;  [facility];  Service: Endoscopy;;  Large Cecal Polyp    POLYPECTOMY  10/06/2021   Procedure: POLYPECTOMY;  Surgeon: [name], DO;  [facility];  Service: Endoscopy;;   SINOSCOPY      Family History: Family History  Problem Relation Age of Onset   Thyroid disease Mother    COPD Father    Cancer Maternal Aunt    Cancer Maternal Uncle  back   Cancer Paternal Uncle        throat    Cancer Cousin        prostate   Colon cancer Neg Hx    Colon polyps Neg Hx    Allergic rhinitis Neg Hx    Angioedema Neg Hx    Asthma Neg Hx    Eczema Neg Hx     Social History:  Lives in a 1920 year house Flooring in bedroom: carpet Pets: none Tobacco use/exposure: none 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        Medication List        Accurate as of January 16, 2022  3:13 PM. If you have any questions, ask your nurse or doctor.          acetaminophen 500 MG tablet Commonly known as: TYLENOL Take 500-1,000 mg by mouth every 6 (six) hours as needed (pain).   amLODipine 10 MG tablet Commonly known as: NORVASC Take 1 tablet (10 mg total) by mouth daily.   aspirin EC 81 MG tablet Take 81 mg by mouth once a week. Swallow whole.   azelastine 0.1 % nasal spray Commonly known as: ASTELIN Place 1 spray into both nostrils 2 (two) times daily. Use in each nostril as directed Started by: Larose Kells, MD   cetirizine 10 MG tablet Commonly known as: ZyrTEC Allergy Take 1 tablet (10 mg total) by mouth daily. Started by: Larose Kells, MD   cloNIDine 0.1 MG tablet Commonly known as: CATAPRES Take 0.1 mg by mouth 2 (two) times daily.   fluticasone 50 MCG/ACT nasal spray Commonly known as: FLONASE Place 2 sprays into both nostrils daily. What changed: when to take this Changed by: Larose Kells, MD   lisinopril 40 MG tablet Commonly known as: ZESTRIL Take 40 mg by mouth daily at 2 am. (0300-0400)   Olopatadine HCl 0.2 % Soln Apply 1 drop to eye daily as needed. Started by: Larose Kells, MD   One-A-Day Mens 50+ Advantage Tabs Take 1 tablet by mouth once a week.   Polyethyl Glycol-Propyl Glycol 0.4-0.3 % Soln Apply to eye. Systane eye drops   pregabalin 300 MG capsule Commonly known as: Lyrica Take 1 capsule (300 mg total) by mouth 2 (two) times daily.   PROVENT HEMP PAIN RELIEF EX Apply 1 Application topically daily as needed (pain (neck/shoulders)).   rosuvastatin 5 MG tablet Commonly known as: CRESTOR Take 5 mg by mouth at bedtime.         REVIEW OF SYSTEMS: Pertinent positives and negatives discussed in HPI.   Objective:   Physical Exam: BP 132/72   Pulse 72   Temp 98.1 F (36.7 C)   Resp 18   Ht '5\' 10"'$  (1.778 m)   Wt (!) 318 lb 6 oz  (144.4 kg)   SpO2 97%   BMI 45.68 kg/m  Body mass index is 45.68 kg/m. GEN: alert, well developed HEENT: clear conjunctiva, TM grey and translucent, nose with + inferior turbinate hypertrophy, pink nasal mucosa, slight clear rhinorrhea, + cobblestoning HEART: regular rate and rhythm, no murmur LUNGS: clear to auscultation bilaterally, no coughing, unlabored respiration ABDOMEN: soft, non distended  SKIN: no rashes or lesions  Reviewed:  11/2020: ENT surgery for septoplasty + turbinate reduction  bl   Skin Testing:  Skin prick testing was placed, which includes aeroallergens/foods, histamine control, and saline control.  Verbal consent was obtained prior to placing test.  Patient tolerated procedure well.  Allergy testing results were read and interpreted by myself, documented by clinical staff. Adequate positive and negative control.  Results discussed with patient/family.  Airborne Adult Perc - 01/16/22 1358     Time Antigen Placed 1358    Allergen Manufacturer Lavella Hammock    Location Back    Number of Test 59    1. Control-Buffer 50% Glycerol Negative    2. Control-Histamine 1 mg/ml 3+    3. Albumin saline Negative    4. [place] Negative    5. [place] Negative    6. Johnson Negative    7. Glasco Blue Negative    8. Meadow Fescue Negative    9. Perennial Rye Negative    10. Sweet Vernal Negative    11. Timothy Negative    12. Cocklebur Negative    13. Burweed Marshelder Negative    14. Ragweed, short Negative    15. Ragweed, Giant Negative    16. Plantain,  English Negative    17. Lamb's Quarters Negative    18. Sheep Sorrell Negative    19. Rough Pigweed Negative    20. Marsh Elder, Rough Negative    21. Mugwort, Common Negative    22. Ash mix Negative    23. Birch mix Negative    24. Beech American Negative    25. Box, Elder Negative    26. Cedar, red Negative    27. Cottonwood, [place] Negative    28. Elm mix Negative    29. Hickory Negative    30. Maple mix  Negative    31. Oak, [place] mix Negative    32. Pecan Pollen Negative    33. Pine mix Negative    34. Sycamore Eastern Negative    35. Tichigan, Black Pollen Negative    36. Alternaria alternata Negative    37. Cladosporium Herbarum Negative    38. Aspergillus mix Negative    39. Penicillium mix Negative    40. Bipolaris sorokiniana (Helminthosporium) Negative    41. Drechslera spicifera (Curvularia) Negative    42. Mucor plumbeus Negative    43. Fusarium moniliforme Negative    44. Aureobasidium pullulans (pullulara) Negative    45. Rhizopus oryzae Negative    46. Botrytis cinera Negative    47. Epicoccum nigrum Negative    48. Phoma betae Negative    49. Candida Albicans Negative    50. Trichophyton mentagrophytes Negative    51. Mite, D Farinae  5,000 AU/ml Negative    52. Mite, D Pteronyssinus  5,000 AU/ml Negative    53. Cat Hair 10,000 BAU/ml Negative    54.  Dog Epithelia Negative    55. Mixed Feathers Negative    56. Horse Epithelia Negative    57. Cockroach, German Negative    58. Mouse Negative    59. Tobacco Leaf 3+             Intradermal - 01/16/22 1429     Time Antigen Placed 1429    Allergen Manufacturer Lavella Hammock    Location Arm    Number of Test 15    Control Negative    Guatemala Negative    Johnson Negative    7 Grass Negative    Ragweed mix Negative    Weed mix Negative    Tree mix 2+  Mold 1 2+    Mold 2 2+    Mold 3 2+    Mold 4 2+    Cat Negative    Dog Negative    Cockroach Negative               Assessment:   1. Seasonal and perennial allergic rhinitis   2. 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  Mild reactivity on ID.  He was wondering if we need to do blood testing.  We can consider repeat testing in the future if symptoms persist  but skin testing is usually more sensitive than blood testing.  - Avoidance measures discussed. - Use nasal saline rinses before nose sprays such as with Neilmed Sinus Rinse bottle.  Use distilled water with the packets.  - Use Flonase 2 sprays each nostril daily. Aim upward and outward. - Use Azelastine 1 sprays each nostril twice daily. Aim upward and outward. - Use Zyrtec 10 mg daily as needed for runny nose or itchy watery eyes.   - For eyes, use Olopatadine or Ketotifen drops as needed for itchy watery eyes.  Available over the counter, if not covered by insurance. You can Systane lubricating eye drops as needed.   - Consider allergy shots as long term control of your symptoms by teaching your immune system to be more tolerant of your allergy triggers   Sting Reaction - If you have systemic effects from sting, please let us know. - No need for testing for venom allergy at this time.     Return in about 6 weeks (around 02/27/2022).  Harlon Flor, MD Allergy and [facility]

## 2022-01-17 NOTE — Telephone Encounter (Signed)
Error

## 2022-01-19 ENCOUNTER — Telehealth: Payer: Self-pay | Admitting: Cardiology

## 2022-01-19 DIAGNOSIS — R42 Dizziness and giddiness: Secondary | ICD-10-CM

## 2022-01-19 DIAGNOSIS — I10 Essential (primary) hypertension: Secondary | ICD-10-CM

## 2022-01-19 MED ORDER — CHLORTHALIDONE 25 MG PO TABS: 12.5000 mg | ORAL_TABLET | Freq: Every day | ORAL | 6 refills | Status: DC

## 2022-01-19 NOTE — Telephone Encounter (Signed)
Patient notified and verbalized understanding.  New medication sent to [facility] & lab order mailed to patient - he will do at [facility] in 2 weeks. Asked him to give [name] update on BP readings in a couple weeks.

## 2022-01-19 NOTE — Telephone Encounter (Signed)
Spoke with patient about elevated BP readings.   11/13 - 140/80  68 11/12 - 142/90  65 11/11 - 128/85  70  Patient states that he notices BP increasing more with activity.  No sob or cp - does notice dizziness occasionally.    States that he just took his Clonidine early at 3:30 today instead of waiting till later this evening.  States his body feels like it is throbbing when pressure is elevated.

## 2022-01-19 NOTE — Telephone Encounter (Signed)
BP's are very mildly elevated, bp goal several years ago actually would have been 140s/90s until lowered not too long ago to 130s/80s. Typically bps would have to be in the 180s or 190s for significant symptoms to develop. 140s/90s would not cause any symptoms, but needs to be better controlled. Can he start chlorthalidone 12.'5mg'$  daily, check bmet 2 weeks.    Zandra Abts MD

## 2022-01-19 NOTE — Telephone Encounter (Signed)
Pt c/o BP issue: STAT if pt c/o blurred vision, one-sided weakness or slurred speech  1. What are your last 5 BP readings? 149/90 148/95 today and yesterday  2. Are you having any other symptoms (ex. Dizziness, headache, blurred vision, passed out)? Body is throbbing, he can tell hi s pressure is up  3. What is your BP issue? High blood pressure

## 2022-01-20 ENCOUNTER — Telehealth: Payer: Self-pay | Admitting: Cardiology

## 2022-01-20 MED ORDER — CHLORTHALIDONE 25 MG PO TABS: 25.0000 mg | ORAL_TABLET | ORAL | Status: DC

## 2022-01-20 NOTE — Telephone Encounter (Signed)
Pt c/o medication issue:  1. Name of Medication: Chlorthalidone  2. How are you currently taking this medication (dosage and times per day)? 1/2 tablet a day  3. Are you having a reaction (difficulty breathing--STAT)?   4. What is your medication issue? Unable to cut this medicine in half, because pill is so small

## 2022-01-20 NOTE — Telephone Encounter (Signed)
Patient states that he is using a pill cutter, but is crumbling.  Advised to take whole tab every other day - ok to do this per JB.  Patient verbalized understanding.

## 2022-01-24 NOTE — Telephone Encounter (Signed)
Pt is calling. Stated his insurance needs documentation on why he was taking off the Gabapentin.

## 2022-01-25 NOTE — Telephone Encounter (Signed)
I reviewed drug registry, last refill for Lyrica 01/12/2022 # 60 for a 30 day supply. At this time rx is not needed for lyrica and he is off gapentin.   I have sent a my chart message to the pt on this message and will wait to hear back.. At this time pt has the medication he has been prescribed.

## 2022-01-27 ENCOUNTER — Encounter (HOSPITAL_COMMUNITY): Payer: Self-pay

## 2022-01-27 ENCOUNTER — Ambulatory Visit (HOSPITAL_COMMUNITY): Payer: 59

## 2022-01-31 DIAGNOSIS — G4733 Obstructive sleep apnea (adult) (pediatric): Secondary | ICD-10-CM | POA: Diagnosis not present

## 2022-01-31 DIAGNOSIS — M542 Cervicalgia: Secondary | ICD-10-CM | POA: Diagnosis not present

## 2022-01-31 DIAGNOSIS — M47812 Spondylosis without myelopathy or radiculopathy, cervical region: Secondary | ICD-10-CM | POA: Diagnosis not present

## 2022-01-31 DIAGNOSIS — M5412 Radiculopathy, cervical region: Secondary | ICD-10-CM | POA: Diagnosis not present

## 2022-02-03 ENCOUNTER — Telehealth: Payer: Self-pay | Admitting: Cardiology

## 2022-02-03 MED ORDER — LISINOPRIL 40 MG PO TABS: 40.0000 mg | ORAL_TABLET | Freq: Every day | ORAL | 1 refills | Status: DC

## 2022-02-03 MED ORDER — CLONIDINE HCL 0.1 MG PO TABS
0.1000 mg | ORAL_TABLET | Freq: Two times a day (BID) | ORAL | 1 refills | Status: DC
Start: 2022-02-03 — End: 2022-02-06

## 2022-02-03 MED ORDER — AMLODIPINE BESYLATE 10 MG PO TABS: 10.0000 mg | ORAL_TABLET | Freq: Every day | ORAL | 1 refills | Status: DC

## 2022-02-03 MED ORDER — CHLORTHALIDONE 25 MG PO TABS: 25.0000 mg | ORAL_TABLET | ORAL | 1 refills | Status: DC

## 2022-02-03 NOTE — Addendum Note (Signed)
Addended by: Sung Amabile on: 02/03/2022 12:49 PM   Modules accepted: Orders

## 2022-02-03 NOTE — Telephone Encounter (Signed)
*  STAT* If patient is at the pharmacy, call can be transferred to refill team.   1. Which medications need to be refilled? (please list name of each medication and dose if known)  lisinopril (ZESTRIL) 40 MG tablet  amLODipine (NORVASC) 10 MG tablet  cloNIDine (CATAPRES) 0.1 MG tablet  chlorthalidone (HYGROTON) 25 MG tablet   2. Which pharmacy/location (including street and city if local pharmacy) is medication to be sent to? [id number] [place], [place] - [address]  3. Do they need a 30 day or 90 day supply? 90 day

## 2022-02-03 NOTE — Telephone Encounter (Signed)
Prescriptions sent to [facility], per patient request

## 2022-02-06 ENCOUNTER — Other Ambulatory Visit: Payer: Self-pay | Admitting: *Deleted

## 2022-02-06 ENCOUNTER — Other Ambulatory Visit: Payer: Self-pay | Admitting: Cardiology

## 2022-02-06 MED ORDER — LISINOPRIL 40 MG PO TABS: 40.0000 mg | ORAL_TABLET | Freq: Every day | ORAL | 2 refills | Status: DC

## 2022-02-06 MED ORDER — CLONIDINE HCL 0.1 MG PO TABS
0.1000 mg | ORAL_TABLET | Freq: Two times a day (BID) | ORAL | 2 refills | Status: DC
Start: 2022-02-06 — End: 2022-04-28

## 2022-02-07 ENCOUNTER — Other Ambulatory Visit: Payer: Self-pay | Admitting: *Deleted

## 2022-02-07 ENCOUNTER — Telehealth: Payer: Self-pay | Admitting: Cardiology

## 2022-02-07 MED ORDER — LISINOPRIL 40 MG PO TABS: 40.0000 mg | ORAL_TABLET | Freq: Every day | ORAL | 2 refills | Status: DC

## 2022-02-07 MED ORDER — CHLORTHALIDONE 25 MG PO TABS: 12.5000 mg | ORAL_TABLET | ORAL | Status: DC

## 2022-02-07 NOTE — Telephone Encounter (Signed)
Pt c/o BP issue: STAT if pt c/o blurred vision, one-sided weakness or slurred speech  1. What are your last 5 BP readings? 140/91 132/85 112/72 145/92 142/89  2. Are you having any other symptoms (ex. Dizziness, headache, blurred vision, passed out)? Dizziness, headache and blurred vision  3. What is your BP issue? Pt states that he is having issues controlling his BP and is currently experiencing blurred vision.

## 2022-02-07 NOTE — Telephone Encounter (Addendum)
Spoke with patient - c/o same symptoms that he was c/o in August with the blurry vision, dizziness.  Seems to be getting worse.  States that he has no balance at all, can't walk fast - gets dizzy & feels like everything goes white.  Also, having loud ringing in his left ear.  States he is having MRI on neck/shoulder on 02/17/2022 - was supposed to have had already but had issues with insurance.  Did get epidural shots in his back.  Also was seen by [facility] where they did 5 trigger point injections over 2 weeks.  And finally being referred to ENT for the ringing in his ears, seeing them on 02/16/2022.  States that when is BP gets around 135/83 is when he notices the issues with vision & throbbing heart rate.  HR running 69-74.  Medications reviewed - all same except he decreased the Chlorthalidone to 12.5 every other day on his own around end of November.  Felt like his bp was getting too low.

## 2022-02-07 NOTE — Addendum Note (Signed)
Addended by: Laurine Blazer on: 02/07/2022 04:58 PM   Modules accepted: Orders

## 2022-02-08 ENCOUNTER — Other Ambulatory Visit: Payer: Self-pay | Admitting: *Deleted

## 2022-02-08 MED ORDER — CHLORTHALIDONE 25 MG PO TABS: 12.5000 mg | ORAL_TABLET | ORAL | 3 refills | Status: DC

## 2022-02-08 MED ORDER — AMLODIPINE BESYLATE 10 MG PO TABS: 10.0000 mg | ORAL_TABLET | Freq: Every day | ORAL | 3 refills | Status: DC

## 2022-02-09 NOTE — Telephone Encounter (Signed)
BPs are normal to just mildly elevated, would physiologically not cause any symptoms as these bp's are well tolerated by the body. TYpically the top bp number (systolic bp) would have to be in the 80s-90s on the low end or 180s-190s  on top end for blood pressure itself to cause symptoms. Would continue working with his other doctors for ongoing symptoms, this is not a bp issue  Zandra Abts MD

## 2022-02-10 NOTE — Telephone Encounter (Signed)
Patient notified and verbalized understanding. 

## 2022-02-15 ENCOUNTER — Encounter (HOSPITAL_COMMUNITY): Payer: Self-pay | Admitting: Emergency Medicine

## 2022-02-15 ENCOUNTER — Emergency Department (HOSPITAL_COMMUNITY)
Admission: EM | Admit: 2022-02-15 | Discharge: 2022-02-15 | Disposition: A | Discharge status: Home / Self Care | Payer: 59 | Attending: Emergency Medicine | Admitting: Emergency Medicine

## 2022-02-15 ENCOUNTER — Other Ambulatory Visit: Payer: Self-pay

## 2022-02-15 ENCOUNTER — Emergency Department (HOSPITAL_COMMUNITY): Payer: 59

## 2022-02-15 DIAGNOSIS — M5136 Other intervertebral disc degeneration, lumbar region: Secondary | ICD-10-CM | POA: Insufficient documentation

## 2022-02-15 DIAGNOSIS — M545 Low back pain, unspecified: Secondary | ICD-10-CM | POA: Diagnosis not present

## 2022-02-15 DIAGNOSIS — I10 Essential (primary) hypertension: Secondary | ICD-10-CM | POA: Insufficient documentation

## 2022-02-15 DIAGNOSIS — M549 Dorsalgia, unspecified: Secondary | ICD-10-CM | POA: Diagnosis not present

## 2022-02-15 DIAGNOSIS — M5137 Other intervertebral disc degeneration, lumbosacral region: Secondary | ICD-10-CM

## 2022-02-15 MED ORDER — DEXAMETHASONE SODIUM PHOSPHATE 10 MG/ML IJ SOLN
10.0000 mg | Freq: Once | INTRAMUSCULAR | Status: AC
  Administered 2022-02-15: 10 mg via INTRAMUSCULAR
  Filled 2022-02-15: qty 1

## 2022-02-15 MED ORDER — OXYCODONE-ACETAMINOPHEN 5-325 MG PO TABS: 1.0000 | ORAL_TABLET | Freq: Four times a day (QID) | ORAL | 0 refills | Status: DC | PRN

## 2022-02-15 MED ORDER — PREDNISONE 10 MG PO TABS: ORAL_TABLET | ORAL | 0 refills | Status: DC

## 2022-02-15 MED ORDER — OXYCODONE-ACETAMINOPHEN 5-325 MG PO TABS
2.0000 | ORAL_TABLET | Freq: Once | ORAL | Status: AC
  Administered 2022-02-15: 2 via ORAL
  Filled 2022-02-15: qty 2

## 2022-02-15 NOTE — ED Triage Notes (Signed)
Pt c/o chronic lower back pain. States worse this am and radiating down both legs. Nad. Has MRI scheduled. Pt is ambulatory

## 2022-02-15 NOTE — ED Notes (Signed)
Pt transported to MRI 

## 2022-02-15 NOTE — ED Provider Notes (Signed)
[facility] EMERGENCY DEPARTMENT Provider Note   CSN: [id number] Arrival date & time: 02/15/22  1059     History  Chief Complaint  Patient presents with   Back Pain    Micheal Ross. is a 61 y.o. male with a history including hypertension, hyperlipidemia, GERD and sleep apnea presenting for evaluation of acute on chronic low back pain.  He has a history of chronic low back pain, he has acutely worse pain over the past several days despite no overuse or injury or falls.  He has pain, burning and numbness that radiates down his bilateral legs to his lateral lower thigh regions.  He denies urinary frequency or urine or fecal incontinence, but does express recent sensation of incomplete bladder emptying.  Has had no fevers or chills, no cancer history, had a benign brain meningioma surgery in 2014.  He is currently taking gabapentin for cervical spondylosis has also tried Tylenol without improvement in his symptoms.   The history is provided by the patient.       Home Medications Prior to Admission medications   Medication Sig Start Date End Date Taking? Authorizing Provider  acetaminophen (TYLENOL) 500 MG tablet Take 500-1,000 mg by mouth every 6 (six) hours as needed (pain).    [provider]  amLODipine (NORVASC) 10 MG tablet Take 1 tablet (10 mg total) by mouth daily. 02/08/22   Arnoldo Lenis, MD  aspirin EC 81 MG tablet Take 81 mg by mouth once a week. Swallow whole.    [provider]  azelastine (ASTELIN) 0.1 % nasal spray Place 1 spray into both nostrils 2 (two) times daily. Use in each nostril as directed 01/16/22   Larose Kells, MD  cetirizine (ZYRTEC ALLERGY) 10 MG tablet Take 1 tablet (10 mg total) by mouth daily. 01/16/22   Larose Kells, MD  chlorthalidone (HYGROTON) 25 MG tablet Take 0.5 tablets (12.5 mg total) by mouth every other day. 02/08/22   Arnoldo Lenis, MD  cloNIDine (CATAPRES) 0.1 MG tablet Take 1 tablet (0.1 mg total) by mouth  2 (two) times daily. 02/06/22   Arnoldo Lenis, MD  fluticasone (FLONASE) 50 MCG/ACT nasal spray Place 2 sprays into both nostrils daily. 01/16/22   Larose Kells, MD  Hemp Oil-Vanillyl Butyl Ether (PROVENT HEMP PAIN RELIEF EX) Apply 1 Application topically daily as needed (pain (neck/shoulders)).    [provider]  lisinopril (ZESTRIL) 40 MG tablet Take 1 tablet (40 mg total) by mouth daily. 02/07/22   Arnoldo Lenis, MD  Multiple Vitamins-Minerals (ONE-A-DAY MENS 50+ ADVANTAGE) TABS Take 1 tablet by mouth once a week.    [provider]  Olopatadine HCl 0.2 % SOLN Apply 1 drop to eye daily as needed. 01/16/22   Larose Kells, MD  Polyethyl Glycol-Propyl Glycol 0.4-0.3 % SOLN Apply to eye. Systane eye drops    [provider]  pregabalin (LYRICA) 300 MG capsule Take 1 capsule (300 mg total) by mouth 2 (two) times daily. 01/12/22 05/12/22  Alric Ran, MD  rosuvastatin (CRESTOR) 5 MG tablet Take 5 mg by mouth at bedtime. 07/15/21   [provider]      Allergies    Methocarbamol and Pollen extract    Review of Systems   Review of Systems  Constitutional:  Negative for fever.  Respiratory:  Negative for shortness of breath.   Cardiovascular:  Negative for chest pain and leg swelling.  Gastrointestinal:  Negative for abdominal distention, abdominal pain and  constipation.  Genitourinary:  Negative for difficulty urinating, dysuria, flank pain, frequency and urgency.  Musculoskeletal:  Positive for back pain. Negative for gait problem and joint swelling.  Skin:  Negative for rash.  Neurological:  Positive for numbness. Negative for weakness.    Physical Exam Updated Vital Signs BP 119/84 (BP Location: Right Arm)   Pulse 78   Temp 97.8 F (36.6 C) (Oral)   Resp 16   SpO2 96%  Physical Exam Vitals and nursing note reviewed.  Constitutional:      Appearance: He is well-developed.  HENT:     Head: Normocephalic.  Eyes:      Conjunctiva/sclera: Conjunctivae normal.  Cardiovascular:     Rate and Rhythm: Normal rate.     Comments: Pedal pulses normal. Pulmonary:     Effort: Pulmonary effort is normal.  Abdominal:     General: Bowel sounds are normal. There is no distension.     Palpations: Abdomen is soft. There is no mass.  Musculoskeletal:        General: Normal range of motion.     Cervical back: Normal range of motion and neck supple.     Lumbar back: Tenderness present. No swelling, edema or spasms.  Skin:    General: Skin is warm and dry.  Neurological:     Mental Status: He is alert.     Sensory: Sensory deficit present.     Motor: No tremor or atrophy.     Gait: Gait normal.     Deep Tendon Reflexes:     Reflex Scores:      Patellar reflexes are 2+ on the right side and 1+ on the left side.      Achilles reflexes are 2+ on the right side.    Comments: strength deficit noted in left hip flexor muscles.  Ankle flexion and extension intact. Bilateral positive SLR, left greater than right.      ED Results / Procedures / Treatments   Labs (all labs ordered are listed, but only abnormal results are displayed) Labs Reviewed - No data to display  EKG None  Radiology MR LUMBAR SPINE WO CONTRAST  Result Date: 02/15/2022 CLINICAL DATA:  Low back pain over the last 8 months but worsening over the last 2 days. Bilateral thigh pain. EXAM: MRI LUMBAR SPINE WITHOUT CONTRAST TECHNIQUE: Multiplanar, multisequence MR imaging of the lumbar spine was performed. No intravenous contrast was administered. COMPARISON:  None Available. FINDINGS: Segmentation: The lowest lumbar type non-rib-bearing vertebra is labeled as L5. Alignment:  No vertebral subluxation is observed. Vertebrae: Disc desiccation at L3-4, L4-5, and L5-S1 with loss of disc height. No significant vertebral marrow edema is identified. Schmorl's node along the inferior endplate of L3 is likely chronic. Congenitally short pedicles with mild prominence  of epidural adipose tissues. Conus medullaris and cauda equina: Conus extends to the L1 level. Conus and cauda equina appear normal. Paraspinal and other soft tissues: Unremarkable Disc levels: L1-2: Unremarkable L2-3: No impingement. Mild disc bulge and mild degenerative facet arthropathy. L3-4: Mild central narrowing of the thecal sac and mild left foraminal stenosis along with mild left subarticular lateral recess stenosis due to disc bulge, facet arthropathy, intervertebral spurring, and short pedicles L4-5: Mild left foraminal stenosis and borderline left subarticular lateral recess stenosis along with borderline central narrowing of the thecal sac due to disc bulge, intervertebral spurring, facet arthropathy, and short pedicles. L5-S1: Mild to moderate right and mild left foraminal stenosis due to intervertebral and facet spurring. Disc bulge  noted. IMPRESSION: 1. Lumbar spondylosis, congenitally short pedicles, and degenerative disc disease causing mild to moderate impingement at L5-S1 and mild impingement at L3-4 and L4-5. Electronically Signed   By: Van Clines M.D.   On: 02/15/2022 18:58    Procedures Procedures    Medications Ordered in ED Medications  dexamethasone (DECADRON) injection 10 mg (10 mg Intramuscular Given 02/15/22 1441)  oxyCODONE-acetaminophen (PERCOCET/ROXICET) 5-325 MG per tablet 2 tablet (2 tablets Oral Given 02/15/22 1441)    ED Course/ Medical Decision Making/ A&P                           Medical Decision Making Patient with acute on chronic low back pain, now with burning and numbness radiating to his bilateral lower thighs, left greater than right, he does have weakness on the left versus the right lower extremity but ambulatory.  Positive straight leg raise left greater than right.  Given these findings we proceeded to MRI to rule out nerve or cord impingement.  Amount and/or Complexity of Data Reviewed Radiology: ordered.    Details: DDD with mild to  moderate impingement at L5-S1 and mild impingement L3-L5, no cord involvement, no cauda equina.  Discussion of management or test interpretation with external provider(s):  He was placed on a prednisone taper and was given an IM dose of dexamethasone here.  He is also prescribed short course of oxycodone with caution given.  Advised to continue his gabapentin and follow-up with his providers for further management of his symptoms.   Risk Prescription drug management.           Final Clinical Impression(s) / ED Diagnoses Final diagnoses:  DDD (degenerative disc disease), lumbar    Rx / DC Orders ED Discharge Orders     None         Landis Martins 02/16/22 1517    Milton Ferguson, MD 02/18/22 1132

## 2022-02-15 NOTE — Discharge Instructions (Addendum)
You do have significant degenerative changes in the discs of several levels of your lumbar spine.  This is affecting the nerves that is causing the pain radiated into your legs.  However there is no indication that you need surgery for this problem at this time but you will need close follow-up with your back specialist.  In the interim I am prescribing you a course of prednisone, take your next dose tomorrow.  Do not drive within 4 hours of taking oxycodone as this medication will make you drowsy but will help if needed for pain relief.  Continue taking your other home medications as well.

## 2022-02-15 NOTE — ED Notes (Signed)
Pt verbalized understanding of no driving and to use caution within 4 hours of taking pain meds due to meds cause drowsiness 

## 2022-02-16 ENCOUNTER — Other Ambulatory Visit (HOSPITAL_COMMUNITY): Payer: Self-pay | Admitting: Neurological Surgery

## 2022-02-16 DIAGNOSIS — M5412 Radiculopathy, cervical region: Secondary | ICD-10-CM

## 2022-02-16 DIAGNOSIS — H93A2 Pulsatile tinnitus, left ear: Secondary | ICD-10-CM | POA: Diagnosis not present

## 2022-02-17 ENCOUNTER — Ambulatory Visit (HOSPITAL_COMMUNITY): Admission: RE | Admit: 2022-02-17 | Payer: 59 | Source: Ambulatory Visit

## 2022-02-17 ENCOUNTER — Encounter (HOSPITAL_COMMUNITY): Payer: Self-pay

## 2022-02-17 ENCOUNTER — Ambulatory Visit (HOSPITAL_COMMUNITY)
Admission: RE | Admit: 2022-02-17 | Discharge: 2022-02-17 | Disposition: A | Discharge status: Home / Self Care | Payer: 59 | Source: Ambulatory Visit | Attending: Neurological Surgery | Admitting: Neurological Surgery

## 2022-02-17 DIAGNOSIS — R200 Anesthesia of skin: Secondary | ICD-10-CM | POA: Diagnosis not present

## 2022-02-17 DIAGNOSIS — M5412 Radiculopathy, cervical region: Secondary | ICD-10-CM | POA: Diagnosis not present

## 2022-03-08 DIAGNOSIS — Z6841 Body Mass Index (BMI) 40.0 and over, adult: Secondary | ICD-10-CM | POA: Diagnosis not present

## 2022-03-08 DIAGNOSIS — M5412 Radiculopathy, cervical region: Secondary | ICD-10-CM | POA: Diagnosis not present

## 2022-03-08 DIAGNOSIS — M12819 Other specific arthropathies, not elsewhere classified, unspecified shoulder: Secondary | ICD-10-CM | POA: Diagnosis not present

## 2022-03-12 DIAGNOSIS — E785 Hyperlipidemia, unspecified: Secondary | ICD-10-CM | POA: Diagnosis not present

## 2022-03-12 DIAGNOSIS — Z79899 Other long term (current) drug therapy: Secondary | ICD-10-CM | POA: Diagnosis not present

## 2022-03-12 DIAGNOSIS — I10 Essential (primary) hypertension: Secondary | ICD-10-CM | POA: Diagnosis not present

## 2022-03-12 DIAGNOSIS — R079 Chest pain, unspecified: Secondary | ICD-10-CM | POA: Diagnosis not present

## 2022-03-12 DIAGNOSIS — R600 Localized edema: Secondary | ICD-10-CM | POA: Diagnosis not present

## 2022-03-12 DIAGNOSIS — R222 Localized swelling, mass and lump, trunk: Secondary | ICD-10-CM | POA: Diagnosis not present

## 2022-03-12 DIAGNOSIS — R0600 Dyspnea, unspecified: Secondary | ICD-10-CM | POA: Diagnosis not present

## 2022-03-12 DIAGNOSIS — M25512 Pain in left shoulder: Secondary | ICD-10-CM | POA: Diagnosis not present

## 2022-03-13 ENCOUNTER — Other Ambulatory Visit: Payer: Self-pay | Admitting: Orthopedic Surgery

## 2022-03-13 ENCOUNTER — Telehealth: Payer: Self-pay | Admitting: Cardiology

## 2022-03-13 DIAGNOSIS — M75102 Unspecified rotator cuff tear or rupture of left shoulder, not specified as traumatic: Secondary | ICD-10-CM

## 2022-03-13 DIAGNOSIS — M25512 Pain in left shoulder: Secondary | ICD-10-CM | POA: Diagnosis not present

## 2022-03-13 NOTE — Telephone Encounter (Signed)
Patient called stating he was at [facility] emergency room, they advised him to call the office to get Dr. Harl Bowie to order him an echo done, as they felt that was what he need to be done.

## 2022-03-13 NOTE — Telephone Encounter (Signed)
See recent ED visit in care everywhere - requesting that patient check with cardiology to see when he needs Echo.  Last Echo done 05/02/2021 at [facility].

## 2022-03-15 NOTE — Telephone Encounter (Signed)
Patient already has his 6 mo visit scheduled with Dr. Harl Bowie for 04/28/2022 - he will wait to discuss need for echo at this visit.

## 2022-03-20 ENCOUNTER — Ambulatory Visit: Payer: 59 | Admitting: Internal Medicine

## 2022-03-23 DIAGNOSIS — M47816 Spondylosis without myelopathy or radiculopathy, lumbar region: Secondary | ICD-10-CM | POA: Diagnosis not present

## 2022-03-25 ENCOUNTER — Other Ambulatory Visit: Payer: 59

## 2022-03-27 DIAGNOSIS — H93A2 Pulsatile tinnitus, left ear: Secondary | ICD-10-CM | POA: Diagnosis not present

## 2022-03-30 DIAGNOSIS — E7800 Pure hypercholesterolemia, unspecified: Secondary | ICD-10-CM | POA: Diagnosis not present

## 2022-03-30 DIAGNOSIS — Z125 Encounter for screening for malignant neoplasm of prostate: Secondary | ICD-10-CM | POA: Diagnosis not present

## 2022-03-30 DIAGNOSIS — R5383 Other fatigue: Secondary | ICD-10-CM | POA: Diagnosis not present

## 2022-04-01 ENCOUNTER — Emergency Department (HOSPITAL_COMMUNITY): Payer: 59

## 2022-04-01 ENCOUNTER — Other Ambulatory Visit: Payer: Self-pay

## 2022-04-01 ENCOUNTER — Encounter (HOSPITAL_COMMUNITY): Payer: Self-pay | Admitting: *Deleted

## 2022-04-01 ENCOUNTER — Emergency Department (HOSPITAL_COMMUNITY)
Admission: EM | Admit: 2022-04-01 | Discharge: 2022-04-01 | Disposition: A | Discharge status: Home / Self Care | Payer: 59 | Attending: Emergency Medicine | Admitting: Emergency Medicine

## 2022-04-01 DIAGNOSIS — M5412 Radiculopathy, cervical region: Secondary | ICD-10-CM | POA: Insufficient documentation

## 2022-04-01 DIAGNOSIS — M25512 Pain in left shoulder: Secondary | ICD-10-CM | POA: Diagnosis not present

## 2022-04-01 DIAGNOSIS — Z7982 Long term (current) use of aspirin: Secondary | ICD-10-CM | POA: Diagnosis not present

## 2022-04-01 DIAGNOSIS — R918 Other nonspecific abnormal finding of lung field: Secondary | ICD-10-CM | POA: Diagnosis not present

## 2022-04-01 LAB — COMPREHENSIVE METABOLIC PANEL
ALT: 22 U/L (ref 0–44)
AST: 20 U/L (ref 15–41)
Albumin: 4.5 g/dL (ref 3.5–5.0)
Alkaline Phosphatase: 61 U/L (ref 38–126)
Anion gap: 8 (ref 5–15)
BUN: 11 mg/dL (ref 8–23)
CO2: 26 mmol/L (ref 22–32)
Calcium: 9.1 mg/dL (ref 8.9–10.3)
Chloride: 105 mmol/L (ref 98–111)
Creatinine, Ser: 1.15 mg/dL (ref 0.61–1.24)
GFR, Estimated: >60 mL/min (ref >60)
Glucose, Bld: 101 mg/dL — ABNORMAL HIGH (ref 70–99)
Potassium: 4.1 mmol/L (ref 3.5–5.1)
Sodium: 139 mmol/L (ref 135–145)
Total Bilirubin: 0.6 mg/dL (ref 0.3–1.2)
Total Protein: 7.5 g/dL (ref 6.5–8.1)

## 2022-04-01 LAB — CBC WITH DIFFERENTIAL/PLATELET
Abs Immature Granulocytes: 0.01 10*3/uL (ref 0.00–0.07)
Basophils Absolute: 0 10*3/uL (ref 0.0–0.1)
Basophils Relative: 1 %
Eosinophils Absolute: 0.1 10*3/uL (ref 0.0–0.5)
Eosinophils Relative: 2 %
HCT: 39.7 % (ref 39.0–52.0)
Hemoglobin: 13.9 g/dL (ref 13.0–17.0)
Immature Granulocytes: 0 %
Lymphocytes Relative: 28 %
Lymphs Abs: 1.2 10*3/uL (ref 0.7–4.0)
MCH: 32 pg (ref 26.0–34.0)
MCHC: 35 g/dL (ref 30.0–36.0)
MCV: 91.3 fL (ref 80.0–100.0)
Monocytes Absolute: 0.4 10*3/uL (ref 0.1–1.0)
Monocytes Relative: 10 %
Neutro Abs: 2.4 10*3/uL (ref 1.7–7.7)
Neutrophils Relative %: 59 %
Platelets: 182 10*3/uL (ref 150–400)
RBC: 4.35 MIL/uL (ref 4.22–5.81)
RDW: 12.5 % (ref 11.5–15.5)
WBC: 4.1 10*3/uL (ref 4.0–10.5)
nRBC: 0 % (ref 0.0–0.2)

## 2022-04-01 MED ORDER — DEXAMETHASONE SODIUM PHOSPHATE 10 MG/ML IJ SOLN
10.0000 mg | Freq: Once | INTRAMUSCULAR | Status: AC
  Administered 2022-04-01: 10 mg via INTRAVENOUS
  Filled 2022-04-01: qty 1

## 2022-04-01 MED ORDER — METHYLPREDNISOLONE 4 MG PO TBPK: ORAL_TABLET | ORAL | 0 refills | Status: DC

## 2022-04-01 MED ORDER — IOHEXOL 300 MG/ML  SOLN
75.0000 mL | Freq: Once | INTRAMUSCULAR | Status: AC | PRN
  Administered 2022-04-01: 75 mL via INTRAVENOUS

## 2022-04-01 NOTE — Discharge Instructions (Signed)
Your symptoms today are likely related to a pinched nerve in your neck.  The CAT scan of your chest looked good but it did show that you had a couple of nodules at the bottom of your lung, this has been seen in the past but it should be followed up by your family doctor.  There is no other cause of swelling of the neck or the shoulder.  Everything else looked okay.  Thank you for allowing [name] to treat you in the emergency department today.  After reviewing your examination and potential testing that was done it appears that you are safe to go home.  I would like for you to follow-up with your doctor within the next several days, have them obtain your results and follow-up with them to review all of these tests.  If you should develop severe or worsening symptoms return to the emergency department immediately  Please take the Medrol Dosepak for the next 6 days

## 2022-04-01 NOTE — ED Provider Notes (Signed)
[facility] Provider Note   CSN: [id number] Arrival date & time: 04/01/22  2841     History  Chief Complaint  Patient presents with   Shoulder Pain    Micheal Ross. is a 62 y.o. male.   Shoulder Pain    This patient is a very pleasant 63 year old male, he has a history of taking a baby aspirin, he is on clonidine for blood pressure, he takes Lyrica for some chronic neuropathy and pain but presents today with a complaint of left-sided shoulder swelling which is located in the anterior neck and supraclavicular region as well as some pain around the left shoulder and neck radiating down the arm and tingling into the left fourth and fifth digits.  He has had some degree of discomfort in this area for some time and is actually followed up with neurosurgery and Dr. Venetia Constable.  An MRI was performed on his cervical spine about 6 weeks ago which showed lots of degenerative changes, disc bulging and canal narrowing but no significant obvious pathognomonic findings that would require surgical intervention at that time.  He has been on multiple courses of steroids over time and in fact is currently getting lower back steroid injections for chronic pain and osteoarthritis.  He reports that over the last 9 days he has had some progressive pain in this region of his left shoulder going down his arm, he feels like he is having more swelling in the supraclavicular region, he actually saw his surgeon recently because of some discomfort in his neck, he was referred to orthopedics, he has not yet seen them.  Specifically the patient denies chest pain or shortness of breath  Home Medications Prior to Admission medications   Medication Sig Start Date End Date Taking? Authorizing Provider  methylPREDNISolone (MEDROL DOSEPAK) 4 MG TBPK tablet Taper over 6 days 04/01/22  Yes Noemi Chapel, MD  acetaminophen (TYLENOL) 500 MG tablet Take 500-1,000 mg by mouth  every 6 (six) hours as needed (pain).    [provider]  amLODipine (NORVASC) 10 MG tablet Take 1 tablet (10 mg total) by mouth daily. 02/08/22   Arnoldo Lenis, MD  aspirin EC 81 MG tablet Take 81 mg by mouth once a week. Swallow whole.    [provider]  azelastine (ASTELIN) 0.1 % nasal spray Place 1 spray into both nostrils 2 (two) times daily. Use in each nostril as directed 01/16/22   Larose Kells, MD  cetirizine (ZYRTEC ALLERGY) 10 MG tablet Take 1 tablet (10 mg total) by mouth daily. 01/16/22   Larose Kells, MD  chlorthalidone (HYGROTON) 25 MG tablet Take 0.5 tablets (12.5 mg total) by mouth every other day. 02/08/22   Arnoldo Lenis, MD  cloNIDine (CATAPRES) 0.1 MG tablet Take 1 tablet (0.1 mg total) by mouth 2 (two) times daily. 02/06/22   Arnoldo Lenis, MD  fluticasone (FLONASE) 50 MCG/ACT nasal spray Place 2 sprays into both nostrils daily. 01/16/22   Larose Kells, MD  Hemp Oil-Vanillyl Butyl Ether (PROVENT HEMP PAIN RELIEF EX) Apply 1 Application topically daily as needed (pain (neck/shoulders)).    [provider]  lisinopril (ZESTRIL) 40 MG tablet Take 1 tablet (40 mg total) by mouth daily. 02/07/22   Arnoldo Lenis, MD  Multiple Vitamins-Minerals (ONE-A-DAY MENS 50+ ADVANTAGE) TABS Take 1 tablet by mouth once a week.    [provider]  Olopatadine HCl 0.2 % SOLN Apply 1 drop to  eye daily as needed. 01/16/22   Larose Kells, MD  oxyCODONE-acetaminophen (PERCOCET/ROXICET) 5-325 MG tablet Take 1 tablet by mouth every 6 (six) hours as needed. 02/15/22   Evalee Jefferson, PA-C  Polyethyl Glycol-Propyl Glycol 0.4-0.3 % SOLN Apply to eye. Systane eye drops    [provider]  predniSONE (DELTASONE) 10 MG tablet 6, 5, 4, 3, 2 then 1 tablet by mouth daily for 6 days total. 02/15/22   Idol, Almyra Free, PA-C  pregabalin (LYRICA) 300 MG capsule Take 1 capsule (300 mg total) by mouth 2 (two) times daily. 01/12/22 05/12/22  Alric Ran, MD   rosuvastatin (CRESTOR) 5 MG tablet Take 5 mg by mouth at bedtime. 07/15/21   [provider]      Allergies    Methocarbamol and Pollen extract    Review of Systems   Review of Systems  All other systems reviewed and are negative.   Physical Exam Updated Vital Signs BP (!) 140/87 (BP Location: Right Arm)   Pulse 88   Temp 97.9 F (36.6 C) (Oral)   Resp 16   Ht 1.778 m ('5\' 10"'$ )   Wt 98.9 kg   SpO2 99%   BMI 31.28 kg/m  Physical Exam Vitals and nursing note reviewed.  Constitutional:      General: He is not in acute distress.    Appearance: He is well-developed.  HENT:     Head: Normocephalic and atraumatic.     Mouth/Throat:     Pharynx: No oropharyngeal exudate.  Eyes:     General: No scleral icterus.       Right eye: No discharge.        Left eye: No discharge.     Conjunctiva/sclera: Conjunctivae normal.     Pupils: Pupils are equal, round, and reactive to light.  Neck:     Thyroid: No thyromegaly.     Vascular: No JVD.  Cardiovascular:     Rate and Rhythm: Normal rate and regular rhythm.     Heart sounds: Normal heart sounds. No murmur heard.    No friction rub. No gallop.     Comments: There is a fullness in the bilateral supraclavicular regions which feels very fatty soft and nontender, cardiac exam unremarkable.  Symmetrical radial artery pulses Pulmonary:     Effort: Pulmonary effort is normal. No respiratory distress.     Breath sounds: Normal breath sounds. No wheezing or rales.  Abdominal:     General: Bowel sounds are normal. There is no distension.     Palpations: Abdomen is soft. There is no mass.     Tenderness: There is no abdominal tenderness.  Musculoskeletal:        General: No tenderness. Normal range of motion.     Cervical back: Normal range of motion and neck supple.     Comments: Symmetrical appearance of the arms and the legs, there is no significant edema, good range of motion but has decreased range of motion of the left  shoulder secondary to discomfort and pain.  There is tenderness to palpation around the left shoulder girdle and into the rhomboid periscapular region and deltoid  Lymphadenopathy:     Cervical: No cervical adenopathy.  Skin:    General: Skin is warm and dry.     Findings: No erythema or rash.  Neurological:     Mental Status: He is alert.     Coordination: Coordination normal.     Comments: The patient has symmetrical movement of his bilateral arms  with normal sensation diffusely, normal strength in the radial median and ulnar nerve distributions, equal grips, equal strength at the bilateral shoulders triceps biceps and grips.  Lower extremities unremarkable exam  Psychiatric:        Behavior: Behavior normal.     ED Results / Procedures / Treatments   Labs (all labs ordered are listed, but only abnormal results are displayed) Labs Reviewed  COMPREHENSIVE METABOLIC PANEL - Abnormal; Notable for the following components:      Result Value   Glucose, Bld 101 (*)    All other components within normal limits  CBC WITH DIFFERENTIAL/PLATELET    EKG None  Radiology CT Chest W Contrast  Result Date: 04/01/2022 CLINICAL DATA:  62 year old male with neck swelling, left shoulder and arm swelling with pain for 1 week. Query SVC syndrome EXAM: CT CHEST WITH CONTRAST TECHNIQUE: Multidetector CT imaging of the chest was performed during intravenous contrast administration. RADIATION DOSE REDUCTION: This exam was performed according to the departmental dose-optimization program which includes automated exposure control, adjustment of the mA and/or kV according to patient size and/or use of iterative reconstruction technique. CONTRAST:  62m OMNIPAQUE IOHEXOL 300 MG/ML  SOLN [facility] neck CT 08/05/2021. [facility] FINDINGS: Cardiovascular: Left upper extremity IV contrast injection with  patency of the visible major left upper extremity venous structures, left subclavian vein, left innominate vein, and SVC. Grossly stable visible lower neck soft tissues when compared to the 08/05/2021 neck CT last year. Borderline to mild cardiomegaly, stable from earlier this month with no pericardial effusion. Other central mediastinal vascular structures appear intact and enhancing. Mediastinum/Nodes: Negative for mediastinal mass or lymphadenopathy. No axillary lymphadenopathy. Lungs/Pleura: Major airways are patent with mildly lower lung volumes compared to 03/12/2022. No pleural effusion or consolidation. Right lower lobe 9 mm pulmonary nodule on series 5, image 120 was described on 03/31/2020 CT Abdomen and Pelvis report (no images available)., as was a smaller 4-5 mm lingula pulmonary nodule which is on series 5, image 128 today. And both are stable from earlier this month. Upper Abdomen: Negative visible liver, gallbladder, spleen, pancreas, adrenal glands, kidneys, and visible proximal bowel. Some diverticulosis of the visible colon without active inflammation. No free air free fluid in the visible upper abdomen. Musculoskeletal: Visible shoulder osseous structures appear intact and fairly symmetric. No soft tissue stranding about the visible left shoulder and arm. Advanced cervical spine degeneration. Flowing thoracic endplate osteophytes resulting in some thoracic ankylosis. No acute or suspicious osseous lesion identified. IMPRESSION: 1. Negative for SVC syndrome. The SVC, left innominate, left subclavian veins, and visible major venous structures of the left upper extremity appear patent. 2. No acute or inflammatory process identified in the Chest. 3. Two chronic left lung base pulmonary nodules are stable or decreased by report since 03/31/2020 - for two years of stability which designates benign etiology (no follow-up imaging recommended). Electronically Signed   By: HGenevie AnnM.D.   On: 04/01/2022  10:46    Procedures Procedures    Medications Ordered in ED Medications  dexamethasone (DECADRON) injection 10 mg (10 mg Intravenous Given 04/01/22 0945)  iohexol (OMNIPAQUE) 300 MG/ML solution 75 mL (75 mLs Intravenous Contrast Given 04/01/22 1013)    ED Course/ Medical Decision Making/ A&P  Medical Decision Making Amount and/or Complexity of Data Reviewed Labs: ordered. Radiology: ordered.  Risk Prescription drug management.   This patient presents to the ED for concern of left shoulder and neck discomfort and tingling in the left hand as well as swelling around of the neck, this involves an extensive number of treatment options, and is a complaint that carries with it a high risk of complications and morbidity.  The differential diagnosis includes likely radicular symptoms, he does have some increased welling on the left though I suspect this is the way that he is holding his shoulder secondary to pain.  There is no facial swelling and this does not necessarily get worse in the chronologic timeframe to suggest an SVC syndrome but his CT of the chest will be ordered   Co morbidities that complicate the patient evaluation  Obesity   Additional history obtained:  Additional history obtained from MRI from December 15 External records from outside source obtained and reviewed including MRI showing multilevel degenerative change of the cervical spine with some stenosis   Lab Tests:  I Ordered, and personally interpreted labs.  The pertinent results include: White blood cell count is normal, no anemia, metabolic panel is unremarkable   Imaging Studies ordered:  I ordered imaging studies including CT scan of the chest with contrast I independently visualized and interpreted imaging which showed no acute findings to suggest a cause of the patient's swelling around the neck, there is no signs of SVC syndrome, there does not appear to be any signs of  acute inflammation or process in the chest, there is some lung nodules I agree with the radiologist interpretation   Cardiac Monitoring: / EKG:  The patient was maintained on a cardiac monitor.  I personally viewed and interpreted the cardiac monitored which showed an underlying rhythm of: Normal sinus rhythm     Problem List / ED Course / Critical interventions / Medication management  I discussed with the patient the findings of his exam and testing, he is agreeable to follow-up in the outpatient setting He was given a dose of dexamethasone IV and will be prescribed a Medrol Dosepak, the patient is agreeable to follow-up, message sent to his primary spinal specialist as well  Social Determinants of Health:  None   Test / Admission - Considered:  Consider admission and transfer but the patient has no focal findings that would need aggressive intervention at this time.  There is no weakness or numbness, just discomfort in the arm and the neck The patient is aware and agreeable to return should symptoms worsen         Final Clinical Impression(s) / ED Diagnoses Final diagnoses:  Cervical radiculopathy    Rx / DC Orders ED Discharge Orders          Ordered    methylPREDNISolone (MEDROL DOSEPAK) 4 MG TBPK tablet        04/01/22 1104              Noemi Chapel, MD 04/01/22 [phone]

## 2022-04-01 NOTE — ED Triage Notes (Signed)
Pt c/o progressive left shoulder and arm swelling and pain x 1 week. Denies pain.

## 2022-04-04 ENCOUNTER — Telehealth: Payer: Self-pay | Admitting: Neurology

## 2022-04-04 NOTE — Telephone Encounter (Signed)
Patient has an appointment on 04/1922. I have added the patient to wait list for a sooner appointment.

## 2022-04-04 NOTE — Telephone Encounter (Signed)
Pt said, having shoulder pain with swelling. Went to the ED on 04/01/22. Was instructed to see Dr. Zada Finders with [facility]. Wanted to know if Dr. April Manson would need to see me before I seen Dr. Zada Finders.

## 2022-04-05 DIAGNOSIS — M542 Cervicalgia: Secondary | ICD-10-CM | POA: Diagnosis not present

## 2022-04-05 DIAGNOSIS — R222 Localized swelling, mass and lump, trunk: Secondary | ICD-10-CM | POA: Diagnosis not present

## 2022-04-05 DIAGNOSIS — Z6841 Body Mass Index (BMI) 40.0 and over, adult: Secondary | ICD-10-CM | POA: Diagnosis not present

## 2022-04-06 ENCOUNTER — Other Ambulatory Visit (HOSPITAL_COMMUNITY): Payer: Self-pay | Admitting: Physician Assistant

## 2022-04-06 DIAGNOSIS — R222 Localized swelling, mass and lump, trunk: Secondary | ICD-10-CM

## 2022-04-12 ENCOUNTER — Encounter (INDEPENDENT_AMBULATORY_CARE_PROVIDER_SITE_OTHER): Payer: Self-pay | Admitting: *Deleted

## 2022-04-12 ENCOUNTER — Encounter: Payer: Self-pay | Admitting: Internal Medicine

## 2022-04-12 ENCOUNTER — Ambulatory Visit (INDEPENDENT_AMBULATORY_CARE_PROVIDER_SITE_OTHER): Payer: 59 | Admitting: Internal Medicine

## 2022-04-12 VITALS — BP 130/86 | HR 75 | Temp 98.3°F | Ht 70.0 in | Wt 323.0 lb

## 2022-04-12 DIAGNOSIS — D122 Benign neoplasm of ascending colon: Secondary | ICD-10-CM

## 2022-04-12 DIAGNOSIS — G8929 Other chronic pain: Secondary | ICD-10-CM

## 2022-04-12 DIAGNOSIS — R1013 Epigastric pain: Secondary | ICD-10-CM | POA: Diagnosis not present

## 2022-04-12 DIAGNOSIS — K5904 Chronic idiopathic constipation: Secondary | ICD-10-CM

## 2022-04-12 NOTE — H&P (View-Only) (Signed)
Referring Provider: [name], MD Primary Care Physician:  [name], MD Primary GI:  Dr. Abbey Chatters  Chief Complaint  Patient presents with   Abdominal Pain    Mid abdominal pain that is throbbing after eating or drinking anything. Also happens when he lays down. Reports it last a couple of hours.     HPI:   Micheal Ross. is a 62 y.o. male who presents to clinic today for follow-up visit.  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  Underwent surveillance colonoscopy 10/06/2021 with multiple small tubular adenomas removed, recall 5 years.  Today, patient states approximately 4 weeks ago he had sudden onset of epigastric abdominal discomfort.  Primarily postprandial.  Occurs for 2 to 4 hours after meals at which point it subsides.  Mild to moderate in severity, throbbing in nature.  Sometimes wakes him up from sleep.  Denies any acid reflux or heartburn.  No dysphagia odynophagia.  Occasional NSAID use for left shoulder pain.  No history of PUD or H. pylori that he is aware of.  Also with constipation.  States previously regular though now will have a bowel movement every 2 to 3 days.  Sometimes hard, sometimes soft.  Occasional straining.  Also with some swelling in his left shoulder and right shoulder worse on the left.  Currently being worked up.  Has a CT neck scheduled for 2 weeks from now.  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 Repeat in 4 months. path tubulovillous adenoma, negative for high-grade dysplasia   COLONOSCOPY WITH PROPOFOL N/A 01/20/2019   Procedure: COLONOSCOPY WITH PROPOFOL;  Surgeon: Danie Binder, MD; large defect (2 cm x 1.5 cm with small visible vessel with stigmata of recent bleed) in the cecum.  3 hemostatic clips placed (MR conditional).  One 3 mm polyp in the cecum, tortuous colon, external and internal hemorrhoids.  Pathology revealed tubular adenoma.   COLONOSCOPY WITH PROPOFOL N/A 10/06/2021   Procedure: COLONOSCOPY WITH PROPOFOL;  Surgeon: [name], DO;  [facility];  Service: Endoscopy;  Laterality: N/A;  7:30am   HERNIA REPAIR  [id number]   umbilical   NASAL SEPTOPLASTY W/ TURBINOPLASTY Bilateral 11/30/2020   Procedure: NASAL SEPTOPLASTY WITH BILATERAL  TURBINATE REDUCTION;  Surgeon: Leta Baptist, MD;  Location: [facility];  Service: ENT;  Laterality: Bilateral;   POLYPECTOMY  01/13/2019   Procedure: POLYPECTOMY;  Surgeon: Daneil Dolin, MD;  [facility];  Service: Endoscopy;;  Large Cecal Polyp    POLYPECTOMY  10/06/2021   Procedure: POLYPECTOMY;  Surgeon: [name], DO;  [facility];  Service: Endoscopy;;   SINOSCOPY      Current Outpatient  Medications  Medication Sig Dispense Refill   acetaminophen (TYLENOL) 500 MG tablet Take 500-1,000 mg by mouth every 6 (six) hours as needed (pain).     amLODipine (NORVASC) 10 MG tablet Take 1 tablet (10 mg total) by mouth daily. 90 tablet 3   aspirin EC 81 MG tablet Take 81 mg by mouth. Swallow whole. Takes about one a month     azelastine (ASTELIN) 0.1 % nasal spray Place 1 spray into both nostrils 2 (two) times daily. Use in each nostril as directed 30 mL 5   cetirizine (ZYRTEC) 10 MG tablet Take 10 mg by mouth daily.      chlorthalidone (HYGROTON) 25 MG tablet Take 0.5 tablets (12.5 mg total) by mouth every other day. 45 tablet 3   cloNIDine (CATAPRES) 0.1 MG tablet Take 1 tablet (0.1 mg total) by mouth 2 (two) times daily. 180 tablet 2   Hemp Oil-Vanillyl Butyl Ether (PROVENT HEMP PAIN RELIEF EX) Apply 1 Application topically daily as needed (pain (neck/shoulders)).     lisinopril (ZESTRIL) 40 MG tablet Take 1 tablet (40 mg total) by mouth daily. 90 tablet 2   Multiple Vitamins-Minerals (ONE-A-DAY MENS 50+ ADVANTAGE) TABS Take 1 tablet by mouth once a week.     Olopatadine HCl 0.2 % SOLN Apply 1 drop to eye daily as needed. 2.5 mL 5   pregabalin (LYRICA) 300 MG capsule Take 1 capsule (300 mg total) by mouth 2 (two) times daily. 60 capsule 3   rosuvastatin (CRESTOR) 5 MG tablet Take 5 mg by mouth at bedtime.     No current facility-administered medications for this visit.    Allergies as of 04/12/2022 - Review Complete 04/12/2022  Allergen Reaction Noted   Methocarbamol Other (See Comments) 10/12/2021    Family History  Problem Relation Age of Onset   Thyroid disease Mother    COPD Father    Cancer Maternal Aunt    Cancer Maternal Uncle        back   Cancer Paternal Uncle        throat    Cancer Cousin        prostate   Colon cancer Neg Hx    Colon polyps Neg Hx    Allergic rhinitis Neg Hx    Angioedema Neg Hx    Asthma Neg Hx    Eczema Neg Hx     Social History   Socioeconomic History   Marital status: Married    Spouse name: Not on file   Number of children: Not on file   Years of education: Not on file   Highest education level: Not on file  Occupational History   Not on file  Tobacco Use   Smoking status: Former    Packs/day: 0.50    Years: 2.00    Total pack years: 1.00    Types: Cigarettes    Quit date: 03/30/1983    Years since quitting: 39.0    Passive exposure: Past   Smokeless tobacco: Never  Vaping Use   Vaping Use: Never used  Substance and Sexual Activity    Alcohol use: Not Currently    Comment: occ   Drug use: No   Sexual activity: Yes    Comment: married for 1985, 6 grown kids  Other Topics Concern   Not on file  Social History Narrative   Not on file   Social Determinants of Health   Financial Resource Strain: Not on file  Food Insecurity: Not on file  Transportation  Needs: Not on file  Physical Activity: Not on file  Stress: Not on file  Social Connections: Not on file    Subjective: Review of Systems  Constitutional:  Negative for chills and fever.  HENT:  Negative for congestion and hearing loss.   Eyes:  Negative for blurred vision and double vision.  Respiratory:  Negative for cough and shortness of breath.   Cardiovascular:  Negative for chest pain and palpitations.  Gastrointestinal:  Negative for abdominal pain, blood in stool, constipation, diarrhea, heartburn, melena and vomiting.  Genitourinary:  Negative for dysuria and urgency.  Musculoskeletal:  Negative for joint pain and myalgias.  Skin:  Negative for itching and rash.  Neurological:  Negative for dizziness and headaches.  Psychiatric/Behavioral:  Negative for depression. The patient is not nervous/anxious.      Objective: BP 130/86 (BP Location: Right Arm, Patient Position: Sitting, Cuff Size: Large)   Pulse 75   Temp 98.3 F (36.8 C) (Oral)   Ht 5' 10"$  (1.778 m)   Wt (!) 323 lb (146.5 kg)   BMI 46.35 kg/m  Physical Exam Constitutional:      Appearance: Normal appearance. He is obese.  HENT:     Head: Normocephalic and atraumatic.  Eyes:     Extraocular Movements: Extraocular movements intact.     Conjunctiva/sclera: Conjunctivae normal.  Cardiovascular:     Rate and Rhythm: Normal rate and regular rhythm.  Pulmonary:     Effort: Pulmonary effort is normal.     Breath sounds: Normal breath sounds.  Abdominal:     General: Bowel sounds are normal.     Palpations: Abdomen is soft.  Musculoskeletal:        General: Normal range of motion.      Right upper arm: Swelling present.     Left upper arm: Swelling present.       Arms:     Cervical back: Normal range of motion and neck supple.  Skin:    General: Skin is warm.  Neurological:     General: No focal deficit present.     Mental Status: He is alert and oriented to person, place, and time.  Psychiatric:        Mood and Affect: Mood normal.        Behavior: Behavior normal.      Assessment: *Epigastric pain-new onset *Chronic idiopathic constipation *Adenomatous colon polyps  Plan: Will schedule for EGD to evaluate for peptic ulcer disease, esophagitis, gastritis, H. Pylori, duodenitis, or other. Will also evaluate for esophageal stricture, Schatzki's ring, esophageal web or other.   The risks including infection, bleed, or perforation as well as benefits, limitations, alternatives and imponderables have been reviewed with the patient. Potential for esophageal dilation, biopsy, etc. have also been reviewed.  Questions have been answered. All parties agreeable.  For his constipation, recommend to start taking over the counter MiraLAX 1 capful daily.  If this does not adequately control his constipation, I would increase to 2 capfuls daily.  If this is still not adequate, then I would add on once daily Dulcolax (bisacodyl) tablet.   I also recommend drink at least 4 to 6 glasses of water daily.   If upper endoscopy unremarkable, consider ultrasound to rule out biliary colic.  Follow-up after procedure  04/12/2022 8:53 AM   Disclaimer: This note was dictated with voice recognition software. Similar sounding words can inadvertently be transcribed and may not be corrected upon review.

## 2022-04-12 NOTE — Progress Notes (Signed)
  Referring Provider: Shah, Ashish, MD Primary Care Physician:  Shah, Ashish, MD Primary GI:  Dr. Taqwa Deem  Chief Complaint  Patient presents with   Abdominal Pain    Mid abdominal pain that is throbbing after eating or drinking anything. Also happens when he lays down. Reports it last a couple of hours.     HPI:   Micheal Grosvenor Jr. is a 61 y.o. male who presents to clinic today for follow-up visit.  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  Underwent surveillance colonoscopy 10/06/2021 with multiple small tubular adenomas removed, recall 5 years.  Today, patient states approximately 4 weeks ago he had sudden onset of epigastric abdominal discomfort.  Primarily postprandial.  Occurs for 2 to 4 hours after meals at which point it subsides.  Mild to moderate in severity, throbbing in nature.  Sometimes wakes him up from sleep.  Denies any acid reflux or heartburn.  No dysphagia odynophagia.  Occasional NSAID use for left shoulder pain.  No history of PUD or H. pylori that he is aware of.  Also with constipation.  States previously regular though now will have a bowel movement every 2 to 3 days.  Sometimes hard, sometimes soft.  Occasional straining.  Also with some swelling in his left shoulder and right shoulder worse on the left.  Currently being worked up.  Has a CT neck scheduled for 2 weeks from now.  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)   Hypercholesterolemia    Hypertension    Neck pain    Shoulder pain    Sleep apnea    uses bipap at home    Past Surgical History:  Procedure Laterality Date   ANKLE SURGERY     brain tumor removal  2014   CARPAL TUNNEL RELEASE Left 08/15/2021   Procedure: LEFT CARPAL TUNNEL RELEASE;  Surgeon: Yates, Mark C, MD;  Location: [facility];  Service: Orthopedics;  Laterality: Left;   COLONOSCOPY WITH PROPOFOL N/A 01/13/2019    Procedure: COLONOSCOPY WITH PROPOFOL;  Surgeon: Rourk, Robert M, MD;  20 mm polyp found in the cecum that was removed via piecemeal hot snare polypectomy. Repeat in 4 months. path tubulovillous adenoma, negative for high-grade dysplasia   COLONOSCOPY WITH PROPOFOL N/A 01/20/2019   Procedure: COLONOSCOPY WITH PROPOFOL;  Surgeon: Fields, Sandi L, MD; large defect (2 cm x 1.5 cm with small visible vessel with stigmata of recent bleed) in the cecum.  3 hemostatic clips placed (MR conditional).  One 3 mm polyp in the cecum, tortuous colon, external and internal hemorrhoids.  Pathology revealed tubular adenoma.   COLONOSCOPY WITH PROPOFOL N/A 10/06/2021   Procedure: COLONOSCOPY WITH PROPOFOL;  Surgeon: Yanis Larin K, DO;  Location: [facility];  Service: Endoscopy;  Laterality: N/A;  7:30am   HERNIA REPAIR  2010   umbilical   NASAL SEPTOPLASTY W/ TURBINOPLASTY Bilateral 11/30/2020   Procedure: NASAL SEPTOPLASTY WITH BILATERAL  TURBINATE REDUCTION;  Surgeon: Teoh, Su, MD;  Location: [facility];  Service: ENT;  Laterality: Bilateral;   POLYPECTOMY  01/13/2019   Procedure: POLYPECTOMY;  Surgeon: Rourk, Robert M, MD;  Location: [facility];  Service: Endoscopy;;  Large Cecal Polyp    POLYPECTOMY  10/06/2021   Procedure: POLYPECTOMY;  Surgeon: Aviendha Azbell K, DO;  Location: [facility];  Service: Endoscopy;;   SINOSCOPY      Current Outpatient   Medications  Medication Sig Dispense Refill   acetaminophen (TYLENOL) 500 MG tablet Take 500-1,000 mg by mouth every 6 (six) hours as needed (pain).     amLODipine (NORVASC) 10 MG tablet Take 1 tablet (10 mg total) by mouth daily. 90 tablet 3   aspirin EC 81 MG tablet Take 81 mg by mouth. Swallow whole. Takes about one a month     azelastine (ASTELIN) 0.1 % nasal spray Place 1 spray into both nostrils 2 (two) times daily. Use in each nostril as directed 30 mL 5   cetirizine (ZYRTEC) 10 MG tablet Take 10 mg by mouth daily.      chlorthalidone (HYGROTON) 25 MG tablet Take 0.5 tablets (12.5 mg total) by mouth every other day. 45 tablet 3   cloNIDine (CATAPRES) 0.1 MG tablet Take 1 tablet (0.1 mg total) by mouth 2 (two) times daily. 180 tablet 2   Hemp Oil-Vanillyl Butyl Ether (PROVENT HEMP PAIN RELIEF EX) Apply 1 Application topically daily as needed (pain (neck/shoulders)).     lisinopril (ZESTRIL) 40 MG tablet Take 1 tablet (40 mg total) by mouth daily. 90 tablet 2   Multiple Vitamins-Minerals (ONE-A-DAY MENS 50+ ADVANTAGE) TABS Take 1 tablet by mouth once a week.     Olopatadine HCl 0.2 % SOLN Apply 1 drop to eye daily as needed. 2.5 mL 5   pregabalin (LYRICA) 300 MG capsule Take 1 capsule (300 mg total) by mouth 2 (two) times daily. 60 capsule 3   rosuvastatin (CRESTOR) 5 MG tablet Take 5 mg by mouth at bedtime.     No current facility-administered medications for this visit.    Allergies as of 04/12/2022 - Review Complete 04/12/2022  Allergen Reaction Noted   Methocarbamol Other (See Comments) 10/12/2021    Family History  Problem Relation Age of Onset   Thyroid disease Mother    COPD Father    Cancer Maternal Aunt    Cancer Maternal Uncle        back   Cancer Paternal Uncle        throat    Cancer Cousin        prostate   Colon cancer Neg Hx    Colon polyps Neg Hx    Allergic rhinitis Neg Hx    Angioedema Neg Hx    Asthma Neg Hx    Eczema Neg Hx     Social History   Socioeconomic History   Marital status: Married    Spouse name: Not on file   Number of children: Not on file   Years of education: Not on file   Highest education level: Not on file  Occupational History   Not on file  Tobacco Use   Smoking status: Former    Packs/day: 0.50    Years: 2.00    Total pack years: 1.00    Types: Cigarettes    Quit date: 03/30/1983    Years since quitting: 39.0    Passive exposure: Past   Smokeless tobacco: Never  Vaping Use   Vaping Use: Never used  Substance and Sexual Activity    Alcohol use: Not Currently    Comment: occ   Drug use: No   Sexual activity: Yes    Comment: married for 1985, 6 grown kids  Other Topics Concern   Not on file  Social History Narrative   Not on file   Social Determinants of Health   Financial Resource Strain: Not on file  Food Insecurity: Not on file  Transportation   Needs: Not on file  Physical Activity: Not on file  Stress: Not on file  Social Connections: Not on file    Subjective: Review of Systems  Constitutional:  Negative for chills and fever.  HENT:  Negative for congestion and hearing loss.   Eyes:  Negative for blurred vision and double vision.  Respiratory:  Negative for cough and shortness of breath.   Cardiovascular:  Negative for chest pain and palpitations.  Gastrointestinal:  Negative for abdominal pain, blood in stool, constipation, diarrhea, heartburn, melena and vomiting.  Genitourinary:  Negative for dysuria and urgency.  Musculoskeletal:  Negative for joint pain and myalgias.  Skin:  Negative for itching and rash.  Neurological:  Negative for dizziness and headaches.  Psychiatric/Behavioral:  Negative for depression. The patient is not nervous/anxious.      Objective: BP 130/86 (BP Location: Right Arm, Patient Position: Sitting, Cuff Size: Large)   Pulse 75   Temp 98.3 F (36.8 C) (Oral)   Ht 5' 10" (1.778 m)   Wt (!) 323 lb (146.5 kg)   BMI 46.35 kg/m  Physical Exam Constitutional:      Appearance: Normal appearance. He is obese.  HENT:     Head: Normocephalic and atraumatic.  Eyes:     Extraocular Movements: Extraocular movements intact.     Conjunctiva/sclera: Conjunctivae normal.  Cardiovascular:     Rate and Rhythm: Normal rate and regular rhythm.  Pulmonary:     Effort: Pulmonary effort is normal.     Breath sounds: Normal breath sounds.  Abdominal:     General: Bowel sounds are normal.     Palpations: Abdomen is soft.  Musculoskeletal:        General: Normal range of motion.      Right upper arm: Swelling present.     Left upper arm: Swelling present.       Arms:     Cervical back: Normal range of motion and neck supple.  Skin:    General: Skin is warm.  Neurological:     General: No focal deficit present.     Mental Status: He is alert and oriented to person, place, and time.  Psychiatric:        Mood and Affect: Mood normal.        Behavior: Behavior normal.      Assessment: *Epigastric pain-new onset *Chronic idiopathic constipation *Adenomatous colon polyps  Plan: Will schedule for EGD to evaluate for peptic ulcer disease, esophagitis, gastritis, H. Pylori, duodenitis, or other. Will also evaluate for esophageal stricture, Schatzki's ring, esophageal web or other.   The risks including infection, bleed, or perforation as well as benefits, limitations, alternatives and imponderables have been reviewed with the patient. Potential for esophageal dilation, biopsy, etc. have also been reviewed.  Questions have been answered. All parties agreeable.  For his constipation, recommend to start taking over the counter MiraLAX 1 capful daily.  If this does not adequately control his constipation, I would increase to 2 capfuls daily.  If this is still not adequate, then I would add on once daily Dulcolax (bisacodyl) tablet.   I also recommend drink at least 4 to 6 glasses of water daily.   If upper endoscopy unremarkable, consider ultrasound to rule out biliary colic.  Follow-up after procedure  04/12/2022 8:53 AM   Disclaimer: This note was dictated with voice recognition software. Similar sounding words can inadvertently be transcribed and may not be corrected upon review.  

## 2022-04-12 NOTE — Patient Instructions (Addendum)
We will schedule you for upper endoscopy to further evaluate your abdominal discomfort.  If this is unremarkable, we will likely pursue ultrasound to evaluate your gallbladder.  For your constipation, I want you to start taking over the counter MiraLAX 1 capful daily.  If this does not adequately control your constipation, I would increase to 2 capfuls daily.  If this is still not adequate, then I would add on once daily Dulcolax (bisacodyl) tablet.   Be sure to drink at least 6 glasses of water daily.   It was nice seeing you again today.  Dr. Abbey Chatters

## 2022-04-13 ENCOUNTER — Encounter: Payer: Self-pay | Admitting: *Deleted

## 2022-04-17 DIAGNOSIS — R2232 Localized swelling, mass and lump, left upper limb: Secondary | ICD-10-CM | POA: Diagnosis not present

## 2022-04-17 DIAGNOSIS — M791 Myalgia, unspecified site: Secondary | ICD-10-CM | POA: Diagnosis not present

## 2022-04-18 ENCOUNTER — Encounter (HOSPITAL_COMMUNITY)
Admission: RE | Admit: 2022-04-18 | Discharge: 2022-04-18 | Disposition: A | Discharge status: Home / Self Care | Payer: 59 | Source: Ambulatory Visit | Attending: Internal Medicine | Admitting: Internal Medicine

## 2022-04-20 NOTE — Progress Notes (Signed)
Pt called with questions about when he needed to stop eating and what foods he could eat. Questions answered by reviewing letter from office. Pt verbalized understanding by repeating back appropriately.

## 2022-04-21 ENCOUNTER — Encounter (HOSPITAL_COMMUNITY): Payer: Self-pay

## 2022-04-21 ENCOUNTER — Encounter (HOSPITAL_COMMUNITY)
Admission: RE | Disposition: A | Discharge status: Home / Self Care | Payer: Self-pay | Source: Home / Self Care | Attending: Internal Medicine

## 2022-04-21 ENCOUNTER — Ambulatory Visit (HOSPITAL_COMMUNITY)
Admission: RE | Admit: 2022-04-21 | Discharge: 2022-04-21 | Disposition: A | Discharge status: Home / Self Care | Payer: 59 | Attending: Internal Medicine | Admitting: Internal Medicine

## 2022-04-21 ENCOUNTER — Ambulatory Visit (HOSPITAL_BASED_OUTPATIENT_CLINIC_OR_DEPARTMENT_OTHER): Payer: 59 | Admitting: Certified Registered"

## 2022-04-21 ENCOUNTER — Ambulatory Visit (HOSPITAL_COMMUNITY): Payer: 59 | Admitting: Certified Registered"

## 2022-04-21 DIAGNOSIS — R1013 Epigastric pain: Secondary | ICD-10-CM | POA: Diagnosis not present

## 2022-04-21 DIAGNOSIS — I10 Essential (primary) hypertension: Secondary | ICD-10-CM | POA: Insufficient documentation

## 2022-04-21 DIAGNOSIS — K2270 Barrett's esophagus without dysplasia: Secondary | ICD-10-CM

## 2022-04-21 DIAGNOSIS — Z86010 Personal history of colonic polyps: Secondary | ICD-10-CM | POA: Insufficient documentation

## 2022-04-21 DIAGNOSIS — K2970 Gastritis, unspecified, without bleeding: Secondary | ICD-10-CM | POA: Diagnosis not present

## 2022-04-21 DIAGNOSIS — K2289 Other specified disease of esophagus: Secondary | ICD-10-CM

## 2022-04-21 DIAGNOSIS — B9681 Helicobacter pylori [H. pylori] as the cause of diseases classified elsewhere: Secondary | ICD-10-CM | POA: Insufficient documentation

## 2022-04-21 DIAGNOSIS — K259 Gastric ulcer, unspecified as acute or chronic, without hemorrhage or perforation: Secondary | ICD-10-CM | POA: Insufficient documentation

## 2022-04-21 DIAGNOSIS — K219 Gastro-esophageal reflux disease without esophagitis: Secondary | ICD-10-CM | POA: Diagnosis not present

## 2022-04-21 DIAGNOSIS — Z87891 Personal history of nicotine dependence: Secondary | ICD-10-CM

## 2022-04-21 DIAGNOSIS — K5904 Chronic idiopathic constipation: Secondary | ICD-10-CM | POA: Diagnosis not present

## 2022-04-21 DIAGNOSIS — G4730 Sleep apnea, unspecified: Secondary | ICD-10-CM | POA: Insufficient documentation

## 2022-04-21 HISTORY — PX: ESOPHAGOGASTRODUODENOSCOPY (EGD) WITH PROPOFOL: SHX5813

## 2022-04-21 HISTORY — PX: BIOPSY: SHX5522

## 2022-04-21 SURGERY — ESOPHAGOGASTRODUODENOSCOPY (EGD) WITH PROPOFOL
Anesthesia: General

## 2022-04-21 MED ORDER — LIDOCAINE HCL (CARDIAC) PF 100 MG/5ML IV SOSY
PREFILLED_SYRINGE | INTRAVENOUS | Status: DC | PRN
  Administered 2022-04-21: 50 mg via INTRATRACHEAL

## 2022-04-21 MED ORDER — PANTOPRAZOLE SODIUM 40 MG PO TBEC: 40.0000 mg | DELAYED_RELEASE_TABLET | Freq: Two times a day (BID) | ORAL | 11 refills | Status: DC

## 2022-04-21 MED ORDER — PROPOFOL 10 MG/ML IV BOLUS
INTRAVENOUS | Status: DC | PRN
  Administered 2022-04-21: 140 mg via INTRAVENOUS
  Administered 2022-04-21: 40 mg via INTRAVENOUS
  Administered 2022-04-21: 60 mg via INTRAVENOUS
  Administered 2022-04-21: 40 mg via INTRAVENOUS
  Administered 2022-04-21: 20 mg via INTRAVENOUS

## 2022-04-21 MED ORDER — LACTATED RINGERS IV SOLN: INTRAVENOUS | Status: DC | PRN

## 2022-04-21 NOTE — Discharge Instructions (Addendum)
EGD Discharge instructions Please read the instructions outlined below and refer to this sheet in the next few weeks. These discharge instructions provide you with general information on caring for yourself after you leave the hospital. Your doctor may also give you specific instructions. While your treatment has been planned according to the most current medical practices available, unavoidable complications occasionally occur. If you have any problems or questions after discharge, please call your doctor. ACTIVITY You may resume your regular activity but move at a slower pace for the next 24 hours.  Take frequent rest periods for the next 24 hours.  Walking will help expel (get rid of) the air and reduce the bloated feeling in your abdomen.  No driving for 24 hours (because of the anesthesia (medicine) used during the test).  You may shower.  Do not sign any important legal documents or operate any machinery for 24 hours (because of the anesthesia used during the test).  NUTRITION Drink plenty of fluids.  You may resume your normal diet.  Begin with a light meal and progress to your normal diet.  Avoid alcoholic beverages for 24 hours or as instructed by your caregiver.  MEDICATIONS You may resume your normal medications unless your caregiver tells you otherwise.  WHAT YOU CAN EXPECT TODAY You may experience abdominal discomfort such as a feeling of fullness or "gas" pains.  FOLLOW-UP Your doctor will discuss the results of your test with you.  SEEK IMMEDIATE MEDICAL ATTENTION IF ANY OF THE FOLLOWING OCCUR: Excessive nausea (feeling sick to your stomach) and/or vomiting.  Severe abdominal pain and distention (swelling).  Trouble swallowing.  Temperature over 101 F (37.8 C).  Rectal bleeding or vomiting of blood.   Your EGD revealed mild amount inflammation in your stomach.  I took biopsies of this to rule out infection with a bacteria called H. pylori.  Await pathology results, my  office will contact you.  You also had an ulcer in your stomach as well. I took samples of this.   You had evidence of mild Barrett's esophagus as well which also sampled.  Small bowel appeared normal.  I am going to start you on a new medication called pantoprazole twice daily.  This will slowly heal the ulcer.  Limit NSAID use as best that she can.  Follow-up in 3 months.   I hope you have a great rest of your week!  Elon Alas. Abbey Chatters, D.O. Gastroenterology and Hepatology [facility]

## 2022-04-21 NOTE — Anesthesia Preprocedure Evaluation (Signed)
Anesthesia Evaluation  Patient identified by MRN, date of birth, ID band Patient awake    Reviewed: Allergy & Precautions, H&P , NPO status , Patient's Chart, lab work & pertinent test results, reviewed documented beta blocker date and time   Airway Mallampati: II  TM Distance: >3 FB Neck ROM: full    Dental no notable dental hx.    Pulmonary neg pulmonary ROS, shortness of breath, sleep apnea , former smoker   Pulmonary exam normal breath sounds clear to auscultation       Cardiovascular Exercise Tolerance: Good hypertension, negative cardio ROS  Rhythm:regular Rate:Normal     Neuro/Psych  Neuromuscular disease negative neurological ROS  negative psych ROS   GI/Hepatic negative GI ROS, Neg liver ROS,GERD  ,,  Endo/Other  negative endocrine ROS    Renal/GU negative Renal ROS  negative genitourinary   Musculoskeletal   Abdominal   Peds  Hematology negative hematology ROS (+) Blood dyscrasia, anemia   Anesthesia Other Findings   Reproductive/Obstetrics negative OB ROS                             Anesthesia Physical Anesthesia Plan  ASA: 3  Anesthesia Plan: General   Post-op Pain Management:    Induction:   PONV Risk Score and Plan: Propofol infusion  Airway Management Planned:   Additional Equipment:   Intra-op Plan:   Post-operative Plan:   Informed Consent: I have reviewed the patients History and Physical, chart, labs and discussed the procedure including the risks, benefits and alternatives for the proposed anesthesia with the patient or authorized representative who has indicated his/her understanding and acceptance.     Dental Advisory Given  Plan Discussed with: CRNA  Anesthesia Plan Comments:        Anesthesia Quick Evaluation

## 2022-04-21 NOTE — Transfer of Care (Signed)
Immediate Anesthesia Transfer of Care Note  Patient: Micheal Ross.  Procedure(s) Performed: ESOPHAGOGASTRODUODENOSCOPY (EGD) WITH PROPOFOL BIOPSY  Patient Location: Short Stay  Anesthesia Type:General  Level of Consciousness: awake, alert , oriented, and patient cooperative  Airway & Oxygen Therapy: Patient Spontanous Breathing  Post-op Assessment: Report given to RN, Post -op Vital signs reviewed and stable, and Patient moving all extremities  Post vital signs: Reviewed and stable  Last Vitals:  Vitals Value Taken Time  BP 104/55 04/21/22 1318  Temp 36.8 C 04/21/22 1318  Pulse 75 04/21/22 1318  Resp 20 04/21/22 1318  SpO2 97 % 04/21/22 1318    Last Pain:  Vitals:   04/21/22 1318  TempSrc: Oral  PainSc: 0-No pain         Complications: No notable events documented.

## 2022-04-21 NOTE — Op Note (Signed)
[facility] Patient Name: Micheal Ross Procedure Date: 04/21/2022 12:49 PM MRN: [id number] Date of Birth: 1960/08/15 Attending MD: Elon Alas. Abbey Chatters , [place], [id number] CSN: [id number] Age: 62 Admit Type: Outpatient Procedure:                Upper GI endoscopy Indications:              Epigastric abdominal pain Providers:                Elon Alas. Abbey Chatters, DO, Crystal Page, Randa Spike, Technician Referring MD:              Medicines:                See the Anesthesia note for documentation of the                            administered medications Complications:            No immediate complications. Estimated Blood Loss:     Estimated blood loss was minimal. Procedure:                Pre-Anesthesia Assessment:                           - The anesthesia plan was to use monitored                            anesthesia care (MAC).                           After obtaining informed consent, the endoscope was                            passed under direct vision. Throughout the                            procedure, the patient's blood pressure, pulse, and                            oxygen saturations were monitored continuously. The                            [id number]) scope was introduced through the                            mouth, and advanced to the second part of duodenum.                            The upper GI endoscopy was accomplished without                            difficulty. The patient tolerated the procedure                            well. Scope In: 1:07:48 PM  Scope Out: 1:15:09 PM Total Procedure Duration: 0 hours 7 minutes 21 seconds  Findings:      There were esophageal mucosal changes consistent with short-segment       Barrett's esophagus present at the gastroesophageal junction. The       maximum longitudinal extent of these mucosal changes was 1 cm in length.       Mucosa was biopsied with a cold forceps for  histology. One specimen       bottle was sent to pathology.      Patchy mild inflammation characterized by erythema was found in the       entire examined stomach. Biopsies were taken with a cold forceps for       Helicobacter pylori testing.      One non-bleeding cratered gastric ulcer with no stigmata of bleeding was       found in the gastric antrum. The lesion was 7 mm in largest dimension.       Biopsies were taken with a cold forceps for histology.      The duodenal bulb, first portion of the duodenum and second portion of       the duodenum were normal. Impression:               - Esophageal mucosal changes consistent with                            short-segment Barrett's esophagus. Biopsied.                           - Gastritis. Biopsied.                           - Non-bleeding gastric ulcer with no stigmata of                            bleeding. Biopsied.                           - Normal duodenal bulb, first portion of the                            duodenum and second portion of the duodenum. Moderate Sedation:      Per Anesthesia Care Recommendation:           - Patient has a contact number available for                            emergencies. The signs and symptoms of potential                            delayed complications were discussed with the                            patient. Return to normal activities tomorrow.                            Written discharge instructions were provided to the  patient.                           - Resume previous diet.                           - Continue present medications.                           - Await pathology results.                           - Consider repeat upper endoscopy in 3 months to                            evaluate the response to therapy.                           - Return to GI clinic in 2 months.                           - Use Protonix (pantoprazole) 40 mg PO BID.                            - No ibuprofen, naproxen, or other non-steroidal                            anti-inflammatory drugs.                           - COnsider RUQ [name] if pain not improved on PPI                            therapy. Procedure Code(s):        --- Professional ---                           [phone], Esophagogastroduodenoscopy, flexible,                            transoral; with biopsy, single or multiple Diagnosis Code(s):        --- Professional ---                           K22.89, Other specified disease of esophagus                           K29.70, Gastritis, unspecified, without bleeding                           K25.9, Gastric ulcer, unspecified as acute or                            chronic, without hemorrhage or perforation                           R10.13, Epigastric pain CPT copyright 2022 American  Medical Association. All rights reserved. The codes documented in this report are preliminary and upon coder review may  be revised to meet current compliance requirements. Elon Alas. Abbey Chatters, DO [place] Abbey Chatters, DO 04/21/2022 1:23:38 PM This report has been signed electronically. Number of Addenda: 0

## 2022-04-21 NOTE — Interval H&P Note (Signed)
History and Physical Interval Note:  04/21/2022 12:40 PM  Micheal Ross.  has presented today for surgery, with the diagnosis of EPIGASTRIC PAIN.  The various methods of treatment have been discussed with the patient and family. After consideration of risks, benefits and other options for treatment, the patient has consented to  Procedure(s) with comments: ESOPHAGOGASTRODUODENOSCOPY (EGD) WITH PROPOFOL (N/A) - 145pm, asa 3, pt can't come earlier - transportation as a surgical intervention.  The patient's history has been reviewed, patient examined, no change in status, stable for surgery.  I have reviewed the patient's chart and labs.  Questions were answered to the patient's satisfaction.     [name]

## 2022-04-22 NOTE — Anesthesia Postprocedure Evaluation (Signed)
Anesthesia Post Note  Patient: Micheal Ross.  Procedure(s) Performed: ESOPHAGOGASTRODUODENOSCOPY (EGD) WITH PROPOFOL BIOPSY  Patient location during evaluation: Phase II Anesthesia Type: General Level of consciousness: awake Pain management: pain level controlled Vital Signs Assessment: post-procedure vital signs reviewed and stable Respiratory status: spontaneous breathing and respiratory function stable Cardiovascular status: blood pressure returned to baseline and stable Postop Assessment: no headache and no apparent nausea or vomiting Anesthetic complications: no Comments: Late entry   No notable events documented.   Last Vitals:  Vitals:   04/21/22 1236 04/21/22 1318  BP: (!) 143/94 (!) 104/55  Pulse: 77 75  Resp: 16 20  Temp: 37 C 36.8 C  SpO2: 100% 97%    Last Pain:  Vitals:   04/21/22 1318  TempSrc: Oral  PainSc: 0-No pain                 Louann Sjogren

## 2022-04-24 ENCOUNTER — Encounter: Payer: Self-pay | Admitting: Neurology

## 2022-04-24 ENCOUNTER — Ambulatory Visit: Payer: 59 | Admitting: Neurology

## 2022-04-24 VITALS — BP 130/86 | HR 87 | Ht 70.0 in | Wt 318.0 lb

## 2022-04-24 DIAGNOSIS — G5603 Carpal tunnel syndrome, bilateral upper limbs: Secondary | ICD-10-CM

## 2022-04-24 DIAGNOSIS — M5412 Radiculopathy, cervical region: Secondary | ICD-10-CM | POA: Diagnosis not present

## 2022-04-24 LAB — SURGICAL PATHOLOGY

## 2022-04-24 NOTE — Progress Notes (Signed)
[facility]  PATIENT: Micheal Ross. DOB: 08/30/1960  REQUESTING CLINICIAN: [name], MD HISTORY FROM: Patient and spouse  REASON FOR VISIT: Cervical radiculopathy   HISTORICAL  CHIEF COMPLAINT:  Chief Complaint  Patient presents with   Follow-up    Rm 13. Alone. Patient has been diagnosed with degenerative disc disease. He receives injections for DDD. He reports shoulder swelling. He has been to PCP, orthopedics, neurosurgery- they are unsure of etiology of swelling. He has a CT scan this week.   INTERVAL HISTORY  Patient presents today for follow-up, last visit was 6 months ago.  Since then he reports that his neck and shoulder pain has not improved.  He has seen his PCP, went to the ED multiple time, seen neurosurgery and orthopedist but still cannot seem to improve his pain.  He did have a MRI cervical spine back in December which showed mild to moderate foraminal stenosis but no central canal stenosis.  He is following up with Dr. Venetia Constable neurosurgery.  He reports also getting steroid injection with minimal relief.  He is still on the Lyrica 150 mg twice daily.   INTERVAL HISTORY 10/12/21 Patient presents today for follow-up, at last visit plan was to start pregabalin for cervical radiculopathy pain, referral to physical therapy and I also advised patient to follow back with surgeon regarding carpal tunnel release surgery.  He did have the left carpal tunnel release surgery, for the cervical radiculopathy, surgeon has recommended surgery again but patient is deferring.  He is currently getting physical therapy and reports doing the exercises at home with good improvement.  He is on Lyrica and reported the combination of Lyrica and physical therapy actually helps him with his pain. At this moment he is complaining of right-sided above the temple pain . He reports sharp pressure pain, multiple times a day.  And this type of pain has been going on for the  past year.  He reports with the pain, he will have some visual disturbance. He denies any loss of vision, denies any nausea, no vomiting.  Pain is at the surgical site for his meningioma resection.   HISTORY OF PRESENT ILLNESS:  This is a 62 year old gentleman past medical history hypertension, hyperlipidemia, obesity, history of left frontal meningioma that was resected who is presenting with complaint of neck pain radiating to the left shoulder since November 2021.  Patient reports pain is almost constant, described as sharp stabbing pain worse when moving his head to the left or laying on his left side.  He did have MRI of the neck which showed mild to moderate bilateral foraminal narrowing but no signs of myelopathy.  He did have a nerve conduction study which showed bilateral cervical radiculopathy especially involving C8-T1 on the left side and moderate left carpal tunnel syndrome and mild right carpal tunnel syndrome.  He did follow-up with the orthopedic surgery who recommend conservative management for the cervical radiculopathy and surgical management of the left carpal tunnel syndrome. Currently he is on gabapentin 300 mg twice daily but reported help his pain is not controlled. He did completed physical therapy early last year but again no improvement of his symptoms.  Patient also has been reporting of intermittent lightheadedness.  He did report having orthostatic hypotension in the past, his primary care doctor changed his blood pressure medications and since then he has been doing well.  He reported in January he did have a syncopal episode, lasted about 10 to 20 seconds with quick  regain of consciousness, no confusion.  He reported if he held his head back or to the left he will feel lightheaded like he is going to pass out.   OTHER MEDICAL CONDITIONS: Hypertension, Hyperlipidemia, Obesity, left frontal meningioma (resected)    REVIEW OF SYSTEMS: Full 14 system review of systems  performed and negative with exception of: as noted in the HPI   ALLERGIES: Allergies  Allergen Reactions   Methocarbamol Other (See Comments)    Twitching in sleep    HOME MEDICATIONS: Outpatient Medications Prior to Visit  Medication Sig Dispense Refill   amLODipine (NORVASC) 10 MG tablet Take 1 tablet (10 mg total) by mouth daily. 90 tablet 3   azelastine (ASTELIN) 0.1 % nasal spray Place 1 spray into both nostrils 2 (two) times daily. Use in each nostril as directed (Patient taking differently: Place 1 spray into both nostrils daily as needed for rhinitis or allergies. Use in each nostril as directed) 30 mL 5   chlorthalidone (HYGROTON) 25 MG tablet Take 0.5 tablets (12.5 mg total) by mouth every other day. 45 tablet 3   cloNIDine (CATAPRES) 0.1 MG tablet Take 1 tablet (0.1 mg total) by mouth 2 (two) times daily. 180 tablet 2   Hemp Oil-Vanillyl Butyl Ether (PROVENT HEMP PAIN RELIEF EX) Apply 1 Application topically daily as needed (pain (neck/shoulders)).     lisinopril (ZESTRIL) 40 MG tablet Take 1 tablet (40 mg total) by mouth daily. 90 tablet 2   Multiple Vitamins-Minerals (ONE-A-DAY MENS 50+ ADVANTAGE) TABS Take 1 tablet by mouth once a week.     Olopatadine HCl 0.2 % SOLN Apply 1 drop to eye daily as needed. 2.5 mL 5   pantoprazole (PROTONIX) 40 MG tablet Take 1 tablet (40 mg total) by mouth 2 (two) times daily. 60 tablet 11   pregabalin (LYRICA) 300 MG capsule Take 1 capsule (300 mg total) by mouth 2 (two) times daily. 60 capsule 3   rosuvastatin (CRESTOR) 5 MG tablet Take 5 mg by mouth at bedtime.     cetirizine (ZYRTEC) 10 MG tablet Take 10 mg by mouth daily. (Patient not taking: Reported on 04/14/2022)     ibuprofen (ADVIL) 200 MG tablet Take 400 mg by mouth at bedtime.     No facility-administered medications prior to visit.    PAST MEDICAL HISTORY: Past Medical History:  Diagnosis Date   Brain tumor ([place])    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)   Hypercholesterolemia     Hypertension    Neck pain    Shoulder pain    Sleep apnea    uses bipap at home    PAST SURGICAL HISTORY: Past Surgical History:  Procedure Laterality Date   ANKLE SURGERY     brain tumor removal  2014   CARPAL TUNNEL RELEASE Left 08/15/2021   Procedure: LEFT CARPAL TUNNEL RELEASE;  Surgeon: Marybelle Killings, MD;  Location: [facility];  Service: Orthopedics;  Laterality: Left;   COLONOSCOPY WITH PROPOFOL N/A 01/13/2019   Procedure: COLONOSCOPY WITH PROPOFOL;  Surgeon: Daneil Dolin, MD;  20 mm polyp found in the cecum that was removed via piecemeal hot snare polypectomy. Repeat in 4 months. path tubulovillous adenoma, negative for high-grade dysplasia   COLONOSCOPY WITH PROPOFOL N/A 01/20/2019   Procedure: COLONOSCOPY WITH PROPOFOL;  Surgeon: Danie Binder, MD; large defect (2 cm x 1.5 cm with small visible vessel with stigmata of recent bleed) in the cecum.  3 hemostatic clips placed (MR conditional).  One 3 mm  polyp in the cecum, tortuous colon, external and internal hemorrhoids.  Pathology revealed tubular adenoma.   COLONOSCOPY WITH PROPOFOL N/A 10/06/2021   Procedure: COLONOSCOPY WITH PROPOFOL;  Surgeon: [name], DO;  [facility];  Service: Endoscopy;  Laterality: N/A;  7:30am   HERNIA REPAIR  [id number]   umbilical   NASAL SEPTOPLASTY W/ TURBINOPLASTY Bilateral 11/30/2020   Procedure: NASAL SEPTOPLASTY WITH BILATERAL  TURBINATE REDUCTION;  Surgeon: Leta Baptist, MD;  Location: [facility];  Service: ENT;  Laterality: Bilateral;   POLYPECTOMY  01/13/2019   Procedure: POLYPECTOMY;  Surgeon: Daneil Dolin, MD;  [facility];  Service: Endoscopy;;  Large Cecal Polyp    POLYPECTOMY  10/06/2021   Procedure: POLYPECTOMY;  Surgeon: [name], DO;  [facility];  Service: Endoscopy;;   SINOSCOPY      FAMILY HISTORY: Family History  Problem Relation Age of Onset   Thyroid disease Mother    COPD Father    Cancer  Maternal Aunt    Cancer Maternal Uncle        back   Cancer Paternal Uncle        throat    Cancer Cousin        prostate   Colon cancer Neg Hx    Colon polyps Neg Hx    Allergic rhinitis Neg Hx    Angioedema Neg Hx    Asthma Neg Hx    Eczema Neg Hx     SOCIAL HISTORY: Social History   Socioeconomic History   Marital status: Married    Spouse name: Not on file   Number of children: Not on file   Years of education: Not on file   Highest education level: Not on file  Occupational History   Not on file  Tobacco Use   Smoking status: Former    Packs/day: 0.50    Years: 2.00    Total pack years: 1.00    Types: Cigarettes    Quit date: 03/30/1983    Years since quitting: 39.0    Passive exposure: Past   Smokeless tobacco: Never  Vaping Use   Vaping Use: Never used  Substance and Sexual Activity   Alcohol use: Not Currently    Comment: occ   Drug use: No   Sexual activity: Yes    Comment: married for 1985, 6 grown kids  Other Topics Concern   Not on file  Social History Narrative   Not on file   Social Determinants of Health   Financial Resource Strain: Not on file  Food Insecurity: Not on file  Transportation Needs: Not on file  Physical Activity: Not on file  Stress: Not on file  Social Connections: Not on file  Intimate Partner Violence: Not on file    PHYSICAL EXAM  GENERAL EXAM/CONSTITUTIONAL: Vitals:  Vitals:   04/24/22 1010  BP: 130/86  Pulse: 87  Weight: (!) 318 lb (144.2 kg)  Height: 5' 10"$  (1.778 m)   Body mass index is 45.63 kg/m. Wt Readings from Last 3 Encounters:  04/24/22 (!) 318 lb (144.2 kg)  04/21/22 (!) 322 lb 15.6 oz (146.5 kg)  04/12/22 (!) 323 lb (146.5 kg)   Patient is in no distress; well developed, nourished and groomed; neck is supple  EYES: Visual fields full to confrontation, Extraocular movements intacts,   MUSCULOSKELETAL: Gait, strength, tone, movements noted in Neurologic exam below. He does have left  cervical paraspinal muscle tenderness. There is soft tissue  swelling left side of neck.   NEUROLOGIC: MENTAL STATUS:      No data to display         awake, alert, oriented to person, place and time recent and remote memory intact normal attention and concentration language fluent, comprehension intact, naming intact fund of knowledge appropriate  CRANIAL NERVE:  2nd, 3rd, 4th, 6th - visual fields full to confrontation, extraocular muscles intact, no nystagmus 5th - facial sensation symmetric 7th - facial strength symmetric 8th - hearing intact 9th - palate elevates symmetrically, uvula midline 11th - shoulder shrug symmetric 12th - tongue protrusion midline  MOTOR:  normal bulk and tone, full strength in the BUE, BLE with good effort but the left is limited by pain  SENSORY:  normal and symmetric to light touch, pinprick, vibration  COORDINATION:  finger-nose-finger, fine finger movements normal  REFLEXES:  deep tendon reflexes present and symmetric  GAIT/STATION:  normal  DIAGNOSTIC DATA (LABS, IMAGING, TESTING) - I reviewed patient records, labs, notes, testing and imaging myself where available.  Lab Results  Component Value Date   WBC 4.1 04/01/2022   HGB 13.9 04/01/2022   HCT 39.7 04/01/2022   MCV 91.3 04/01/2022   PLT 182 04/01/2022      Component Value Date/Time   NA 139 04/01/2022 0846   NA 139 03/31/2019 1053   K 4.1 04/01/2022 0846   CL 105 04/01/2022 0846   CO2 26 04/01/2022 0846   GLUCOSE 101 (H) 04/01/2022 0846   BUN 11 04/01/2022 0846   BUN 13 03/31/2019 1053   CREATININE 1.15 04/01/2022 0846   CALCIUM 9.1 04/01/2022 0846   PROT 7.5 04/01/2022 0846   PROT 7.6 03/31/2019 1053   ALBUMIN 4.5 04/01/2022 0846   ALBUMIN 4.8 03/31/2019 1053   AST 20 04/01/2022 0846   ALT 22 04/01/2022 0846   ALKPHOS 61 04/01/2022 0846   BILITOT 0.6 04/01/2022 0846   BILITOT <0.2 03/31/2019 1053   GFRNONAA >60 04/01/2022 0846   GFRAA 72 03/31/2019 1053    Lab Results  Component Value Date   CHOL 224 (H) 03/31/2019   HDL 46 03/31/2019   LDLCALC 147 (H) 03/31/2019   TRIG 173 (H) 03/31/2019   CHOLHDL 4.9 03/31/2019   No results found for: "HGBA1C" No results found for: "VITAMINB12" No results found for: "TSH"  MRI Cervical Spine 12/2020 1. 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 2. Mild left foraminal narrowing at C2-3. 3. Moderate right and mild left foraminal narrowing at C3-4. 4. Moderate foraminal narrowing bilaterally at C4-5 and C5-6 is worse on the right. 5. Mild right foraminal narrowing at C6-7. 6. Moderate foraminal narrowing bilaterally at C7-T1 is worse on the left.   MRI Brain 12/2020 1. No acute intracranial abnormality or significant interval change. 2. Stable left frontal encephalomalacia subjacent to the tumor resection. 3. Bilateral exophthalmos   MRI Cervical spine 02/18/2022 1. At C3-4 there is a mild broad-based disc bulge. Bilateral uncovertebral degenerative changes. Moderate right foraminal stenosis. Mild left foraminal stenosis. 2. At C4-5 there is a mild broad-based disc bulge. Mild-moderate bilateral foraminal stenosis. 3. At C5-6 there is a mild broad-based disc bulge. Bilateral uncovertebral degenerative changes. Moderate-severe right foraminal stenosis. Moderate left foraminal stenosis. 4. At C6-7 there is a mild broad-based disc bulge. Mild right foraminal stenosis. 5. At C7-T1 there is a mild broad-based disc bulge. Moderate left foraminal stenosis. 6. No acute osseous injury of the cervical spine.    ASSESSMENT AND PLAN  61  y.o. year old male with history of hypertension, hyperlipidemia, obesity who is presenting for follow up for cervical radiculopathy and carpal tunnel syndrome.  In term of the carpal tunnel syndrome, his pain is improved after carpal tunnel release surgery.  His main complaint right now is the neck and shoulder pain.  He is following up with  neurosurgery and orthopedics.  He is getting steroid injection with minimal relief.  His most recent MRI cervical spine showed mild to moderate foraminal stenosis and no central canal stenosis.  At this time, I am going to recommend patient to continue follow-up with neurosurgery and orthopedics, I will repeat his EMG to look at the extension of his cervical radiculopathy.  He might need physical therapy again.  Follow-up in 6 months or sooner if worse.   1. Cervical radiculopathy   2. Bilateral carpal tunnel syndrome      Patient Instructions  Continue Lyrica 150 mg twice daily Continue with your other medications Continue to follow-up with neurosurgery and orthopedics and PCP Repeat EMG nerve conduction study for cervical radiculopathy Follow-up in 6 months or sooner if worse.  Orders Placed This Encounter  Procedures   NCV with EMG(electromyography)    No orders of the defined types were placed in this encounter.   Return in about 6 months (around 10/23/2022).   Alric Ran, MD 04/24/2022, 5:46 PM  [facility] [address], [place], [place] [address] [phone]

## 2022-04-24 NOTE — Patient Instructions (Addendum)
Continue Lyrica 150 mg twice daily Continue with your other medications Continue to follow-up with neurosurgery and orthopedics and PCP Repeat EMG nerve conduction study for cervical radiculopathy Follow-up in 6 months or sooner if worse.

## 2022-04-25 DIAGNOSIS — M47816 Spondylosis without myelopathy or radiculopathy, lumbar region: Secondary | ICD-10-CM | POA: Diagnosis not present

## 2022-04-26 ENCOUNTER — Encounter (HOSPITAL_COMMUNITY): Payer: Self-pay

## 2022-04-26 ENCOUNTER — Telehealth: Payer: Self-pay | Admitting: Internal Medicine

## 2022-04-26 ENCOUNTER — Ambulatory Visit (HOSPITAL_COMMUNITY)
Admission: RE | Admit: 2022-04-26 | Discharge: 2022-04-26 | Disposition: A | Discharge status: Home / Self Care | Payer: 59 | Source: Ambulatory Visit | Attending: Physician Assistant | Admitting: Physician Assistant

## 2022-04-26 DIAGNOSIS — R222 Localized swelling, mass and lump, trunk: Secondary | ICD-10-CM | POA: Diagnosis not present

## 2022-04-26 DIAGNOSIS — J338 Other polyp of sinus: Secondary | ICD-10-CM | POA: Diagnosis not present

## 2022-04-26 MED ORDER — IOHEXOL 300 MG/ML  SOLN
75.0000 mL | Freq: Once | INTRAMUSCULAR | Status: AC | PRN
  Administered 2022-04-26: 75 mL via INTRAVENOUS

## 2022-04-26 MED ORDER — SODIUM CHLORIDE (PF) 0.9 % IJ SOLN
INTRAMUSCULAR | Status: AC
  Filled 2022-04-26: qty 50

## 2022-04-26 NOTE — Telephone Encounter (Signed)
Patient came by to pick up some paperwork and he was asking for the results from the EGD that he had on 04/21/22.

## 2022-04-27 ENCOUNTER — Telehealth: Payer: Self-pay | Admitting: Internal Medicine

## 2022-04-27 ENCOUNTER — Encounter (HOSPITAL_COMMUNITY): Payer: Self-pay | Admitting: Internal Medicine

## 2022-04-27 MED ORDER — TETRACYCLINE HCL 500 MG PO CAPS: 500.0000 mg | ORAL_CAPSULE | Freq: Four times a day (QID) | ORAL | 0 refills | Status: AC

## 2022-04-27 MED ORDER — BISMUTH 262 MG PO CHEW: 2.0000 | CHEWABLE_TABLET | Freq: Four times a day (QID) | ORAL | 0 refills | Status: AC

## 2022-04-27 MED ORDER — METRONIDAZOLE 500 MG PO TABS: 500.0000 mg | ORAL_TABLET | Freq: Three times a day (TID) | ORAL | 0 refills | Status: AC

## 2022-04-27 NOTE — Telephone Encounter (Signed)
Please let patient know he has H. pylori gastritis.  I will send in 14-day course of tetracycline 500 mg 4 times daily, metronidazole 500 mg 3 times daily.   Continue pantoprazole 40 mg twice daily.   I will also send in bismuth to take 4 times daily as well.    Follow-up as previously scheduled to discuss eradication testing.  Thank you

## 2022-04-28 ENCOUNTER — Ambulatory Visit: Payer: 59 | Attending: Cardiology | Admitting: Cardiology

## 2022-04-28 ENCOUNTER — Encounter: Payer: Self-pay | Admitting: Cardiology

## 2022-04-28 VITALS — BP 130/86 | HR 62 | Ht 70.0 in | Wt 322.2 lb

## 2022-04-28 DIAGNOSIS — I272 Pulmonary hypertension, unspecified: Secondary | ICD-10-CM | POA: Diagnosis not present

## 2022-04-28 DIAGNOSIS — I10 Essential (primary) hypertension: Secondary | ICD-10-CM

## 2022-04-28 NOTE — Progress Notes (Signed)
Clinical Summary [name] is a 62 y.o.male seen today for follow up of the following medical problems.   1. HTN - has had few different ER visitis with HTN - 160/107, 163/113 prior to recent med changes - per pcp now he is on lisinopril 40, norvasc '5mg'$  daily, clonidine 0.'1mg'$  bid.  - home bp's have been 120s/70s - weight is up  293 -->314 since I saw in 06/2021    - home bp's 117-125/70-80 - taking chlorthalidone about once a week as bp's get low when takes regularly - reports chronic fatigue, perhaps related to clonidine.     2.Possible pulmonary HTN - CTA Jan 2024 Central pulmonary arteries  are enlarged in keeping with changes of pulmonary arterial  hypertension.  - - 04/2021 echo pcp office: grossly normal LVEF, limited visualization. Grossly no major valve abnormalitiy - he does have history of OSA   3. OSA - compliant with cpap - followed by Dr Halford Chessman     Other medical issues not addressed this visit   1.Syncope - seen in ER 04/06/21 - was having abdominal and neck pain at the time - EKG NSR, benign labs - 04/2021 echo pcp office: grossly normal LVEF, limited visualization. Grossly no major valve abnormalitiy - pcp 14 day zio patch: min HR 50, max HR 152, Avg HR 73. Rare supraventricular ectopy, three runs of SVT longest 8 beats. Frequent isolated PVCs (6.3%), no NSVT or VT.      - symptoms started Jan 30 - Jan 31 in bathroom. Stood up to walk to bathroom, walked to bathroom. Washing your hands. Felt lightheaded, then passed out and felt going down. Came to quickly, very brief LOC. Got up and family helped back to bedroom. Rested and layed in bed - no issues leading up to that day     Lightheadedness, vision blurry episodes daily. Can occur with with sitting or standing. Can be laying down and symptoms. Some palpitatons at times.    Sometimes if laying on left side and tilts head to left, feels like he may pass out. Resolves with position change.    Pcp  started toprol, stopped norvasc.    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 90 tablet 3   azelastine (ASTELIN) 0.1 % nasal spray Place 1 spray into both nostrils 2 (two) times daily. Use in each nostril as directed (Patient taking differently: Place 1 spray into both nostrils daily as needed for rhinitis or allergies. Use in each nostril as directed) 30 mL 5   Bismuth 262 MG CHEW Chew 2 tablets by mouth in the morning, at noon, in the evening, and at bedtime for 14 days. 112 tablet 0  chlorthalidone (HYGROTON) 25 MG tablet Take 0.5 tablets (12.5 mg total) by mouth every other day. 45 tablet 3   cloNIDine (CATAPRES) 0.1 MG tablet Take 1 tablet (0.1 mg total) by mouth 2 (two) times daily. 180 tablet 2   Hemp Oil-Vanillyl Butyl Ether (PROVENT HEMP PAIN RELIEF EX) Apply 1 Application topically daily as needed (pain (neck/shoulders)).     lisinopril (ZESTRIL) 40 MG tablet Take 1 tablet (40 mg total) by mouth daily. 90 tablet 2   metroNIDAZOLE (FLAGYL) 500 MG tablet Take 1 tablet (500 mg total) by mouth 3 (three) times daily for 14 days. 42 tablet 0   Multiple Vitamins-Minerals (ONE-A-DAY MENS 50+ ADVANTAGE) TABS Take 1 tablet by mouth once a week.     Olopatadine HCl 0.2 % SOLN Apply 1 drop to eye daily as needed. 2.5 mL 5   pantoprazole  (PROTONIX) 40 MG tablet Take 1 tablet (40 mg total) by mouth 2 (two) times daily. 60 tablet 11   pregabalin (LYRICA) 300 MG capsule Take 1 capsule (300 mg total) by mouth 2 (two) times daily. 60 capsule 3   rosuvastatin (CRESTOR) 5 MG tablet Take 5 mg by mouth at bedtime.     tetracycline (SUMYCIN) 500 MG capsule Take 1 capsule (500 mg total) by mouth 4 (four) times daily for 14 days. 56 capsule 0   No current facility-administered medications for this visit.     Past Surgical History:  Procedure Laterality Date   ANKLE SURGERY     BIOPSY  04/21/2022   Procedure: BIOPSY;  Surgeon: [name], DO;  [facility];  Service: Endoscopy;;   brain tumor removal  2014   CARPAL TUNNEL RELEASE Left 08/15/2021   Procedure: LEFT CARPAL TUNNEL RELEASE;  Surgeon: Marybelle Killings, MD;  Location: [facility];  Service: Orthopedics;  Laterality: Left;   COLONOSCOPY WITH PROPOFOL N/A 01/13/2019   Procedure: COLONOSCOPY WITH PROPOFOL;  Surgeon: Daneil Dolin, MD;  20 mm polyp found in the cecum that was removed via piecemeal hot snare polypectomy. Repeat in 4 months. path tubulovillous adenoma, negative for high-grade dysplasia   COLONOSCOPY WITH PROPOFOL N/A 01/20/2019   Procedure: COLONOSCOPY WITH PROPOFOL;  Surgeon: Danie Binder, MD; large defect (2 cm x 1.5 cm with small visible vessel with stigmata of recent bleed) in the cecum.  3 hemostatic clips placed (MR conditional).  One 3 mm polyp in the cecum, tortuous colon, external and internal hemorrhoids.  Pathology revealed tubular adenoma.   COLONOSCOPY WITH PROPOFOL N/A 10/06/2021   Procedure: COLONOSCOPY WITH PROPOFOL;  Surgeon: [name], DO;  [facility];  Service: Endoscopy;  Laterality: N/A;  7:30am   ESOPHAGOGASTRODUODENOSCOPY (EGD) WITH PROPOFOL N/A 04/21/2022   Procedure: ESOPHAGOGASTRODUODENOSCOPY (EGD) WITH PROPOFOL;  Surgeon: [name], DO;  [facility];  Service:  Endoscopy;  Laterality: N/A;  145pm, asa 3, pt can't come earlier - transportation   HERNIA REPAIR  [id number]   umbilical   NASAL SEPTOPLASTY W/ TURBINOPLASTY Bilateral 11/30/2020   Procedure: NASAL SEPTOPLASTY WITH BILATERAL  TURBINATE REDUCTION;  Surgeon: Leta Baptist, MD;  Location: [facility];  Service: ENT;  Laterality: Bilateral;   POLYPECTOMY  01/13/2019   Procedure: POLYPECTOMY;  Surgeon: Daneil Dolin, MD;  [facility];  Service: Endoscopy;;  Large Cecal Polyp    POLYPECTOMY  10/06/2021   Procedure: POLYPECTOMY;  Surgeon: [name], DO;  [facility];  Service: Endoscopy;;  SINOSCOPY       Allergies  Allergen Reactions   Methocarbamol Other (See Comments)    Twitching in sleep      Family History  Problem Relation Age of Onset   Thyroid disease Mother    COPD Father    Cancer Maternal Aunt    Cancer Maternal Uncle        back   Cancer Paternal Uncle        throat    Cancer Cousin        prostate   Colon cancer Neg Hx    Colon polyps Neg Hx    Allergic rhinitis Neg Hx    Angioedema Neg Hx    Asthma Neg Hx    Eczema Neg Hx      Social History Mr. Zindel reports that he quit smoking about 39 years ago. His smoking use included cigarettes. He has a 1.00 pack-year smoking history. He has been exposed to tobacco smoke. He has never used smokeless tobacco. Mr. Gair reports that he does not currently use alcohol.   Review of Systems CONSTITUTIONAL: No weight loss, fever, chills, weakness or fatigue.  HEENT: Eyes: No visual loss, blurred vision, double vision or yellow sclerae.No hearing loss, sneezing, congestion, runny nose or sore throat.  SKIN: No rash or itching.  CARDIOVASCULAR: per hpi RESPIRATORY: No shortness of breath, cough or sputum.  GASTROINTESTINAL: No anorexia, nausea, vomiting or diarrhea. No abdominal pain or blood.  GENITOURINARY: No burning on urination, no polyuria NEUROLOGICAL: No headache,  dizziness, syncope, paralysis, ataxia, numbness or tingling in the extremities. No change in bowel or bladder control.  MUSCULOSKELETAL: No muscle, back pain, joint pain or stiffness.  LYMPHATICS: No enlarged nodes. No history of splenectomy.  PSYCHIATRIC: No history of depression or anxiety.  ENDOCRINOLOGIC: No reports of sweating, cold or heat intolerance. No polyuria or polydipsia.  [name] Kitchen   Physical Examination Today's Vitals   04/28/22 0807  BP: 130/86  Pulse: 62  SpO2: 96%  Weight: (!) 322 lb 3.2 oz (146.1 kg)  Height: '5\' 10"'$  (1.778 m)   Body mass index is 46.23 kg/m.  Gen: resting comfortably, no acute distress HEENT: no scleral icterus, pupils equal round and reactive, no palptable cervical adenopathy,  CV: RRR, no m/rg, no jvd Resp: Clear to auscultation bilaterally GI: abdomen is soft, non-tender, non-distended, normal bowel sounds, no hepatosplenomegaly MSK: extremities are warm, no edema.  Skin: warm, no rash Neuro:  no focal deficits Psych: appropriate affect   Diagnostic Studies     Assessment and Plan   HTN - fatigue possibly related to clonidine, will wean to 0.'1mg'$  daily x 3 days then stop. Continue other meds   2. Pulmonary HTN - suggested by recent CTA - echo last year poor visualization with pcp, will try to repeat to reassess potential pulmonary HTN - does have history of OSA.   EKG today shows NSR   Arnoldo Lenis, M.D.

## 2022-04-28 NOTE — Telephone Encounter (Signed)
Phoned and advised the pt of his result note/medication instructions/ and to return call for eradication appt/lab work. Pt expressed understanding

## 2022-04-28 NOTE — Patient Instructions (Signed)
Medication Instructions:  Decrease your Clonidine to 0.'1mg'$  daily x 3 days, then STOP Continue all other medications.     Labwork: none  Testing/Procedures: Your physician has requested that you have an echocardiogram. Echocardiography is a painless test that uses sound waves to create images of your heart. It provides your doctor with information about the size and shape of your heart and how well your heart's chambers and valves are working. This procedure takes approximately one hour. There are no restrictions for this procedure. Please do NOT wear cologne, perfume, aftershave, or lotions (deodorant is allowed). Please arrive 15 minutes prior to your appointment time. Office will contact with results via phone, letter or mychart.     Follow-Up: 6 months   Any Other Special Instructions Will Be Listed Below (If Applicable).   If you need a refill on your cardiac medications before your next appointment, please call your pharmacy.

## 2022-05-01 ENCOUNTER — Encounter: Payer: Self-pay | Admitting: *Deleted

## 2022-05-01 ENCOUNTER — Telehealth: Payer: Self-pay | Admitting: Nurse Practitioner

## 2022-05-01 ENCOUNTER — Telehealth: Payer: Self-pay | Admitting: Cardiology

## 2022-05-01 NOTE — Telephone Encounter (Signed)
Replied via mychart.

## 2022-05-01 NOTE — Telephone Encounter (Signed)
Called and spoke with patient. Told him that I agree with Dr. Nelly Laurence recommendations to stop Clonidine. Instructed him to take 1/2 tablet of chlorthalidone tomorrow morning and then take a full tablet of chlorthalidone every other day starting Wednesday. Told him that if SBP is not better within 3-4 days of medication adjustment, to give [name] a call and may have to more permanently adjust chlorthalidone. He verbalized understanding. Recommend checking a BMET and Mag level in 2 weeks for medication monitoring. Note routed to [facility], LPN.   Pt verbalized understanding of our conversation and was appreciative of my call.   Micheal Bud, NP

## 2022-05-01 NOTE — Telephone Encounter (Signed)
Patient mentioned that his BP has gone up since stopping medication

## 2022-05-04 ENCOUNTER — Encounter: Payer: Self-pay | Admitting: Radiology

## 2022-05-05 ENCOUNTER — Telehealth: Payer: Self-pay | Admitting: Cardiology

## 2022-05-05 ENCOUNTER — Encounter: Payer: Self-pay | Admitting: *Deleted

## 2022-05-05 NOTE — Telephone Encounter (Signed)
Replied via mychart.

## 2022-05-05 NOTE — Telephone Encounter (Signed)
  Pt c/o BP issue: STAT if pt c/o blurred vision, one-sided weakness or slurred speech  1. What are your last 5 BP readings?   02/26 - 147/85   81  02/27 - 147/92   89  - this day pt started his new medication 02/28 - 137/86   94 02/29 - 134/85   91 03/01 - 121/76   95   2. Are you having any other symptoms (ex. Dizziness, headache, blurred vision, passed out)?   3. What is your BP issue? Pt sent is BP reading after starting his his new medication on 02/27

## 2022-05-05 NOTE — Telephone Encounter (Signed)
Resending to [place] triage

## 2022-05-05 NOTE — Telephone Encounter (Signed)
BP looks good over last few days, no changes  J Kindsey Eblin MD

## 2022-05-12 NOTE — Telephone Encounter (Signed)
Dr Abbey Chatters  Pt phoned this morning advising he has finished his antibiotics for H Pylori. Please advise on his eradication testing.

## 2022-05-16 NOTE — Telephone Encounter (Signed)
Would not consider a pressure as low unless the top number was in the 90s. I think the reported bps are ok and would continue the chlorthalidone as every other day  Zandra Abts MD

## 2022-05-17 ENCOUNTER — Other Ambulatory Visit: Payer: 59

## 2022-05-23 NOTE — Telephone Encounter (Signed)
Phoned and spoke with the pt advised of the instructions, medication and to schedule a 6 week appt. Pt was transferred to the front to schedule appt. Pt expressed understanding

## 2022-05-23 NOTE — Telephone Encounter (Signed)
Given ulcer I would rather wait at least 8 weeks after EGD to heal with PPI twice daily before testing for H pylori eradication as he would need to stop PPI for 10 days prior to testing. Lets get him to come see [name] in office in 6 weeks. Thank you

## 2022-05-26 ENCOUNTER — Telehealth: Payer: Self-pay | Admitting: *Deleted

## 2022-05-26 NOTE — Telephone Encounter (Signed)
Called and spoke w/ pt relayed info from [place]. He verbalized understanding and states if he hasn't heard from them in 1-2 weeks he will call and ask to speak to [place]. NFN at the time of call. Closing encounter.

## 2022-05-26 NOTE — Telephone Encounter (Signed)
Called and spoke w/ Terri from [facility], I inquired what was needed on their end to get pt supplies. She verbalized that she has everything needed to process the order and is unsure why pt was told they needed more information. Karna Christmas states that she is going to try and expedite his service and if pt hasn't heard from them in 2 weeks for him to call and ask for her.

## 2022-05-26 NOTE — Telephone Encounter (Signed)
Called and spoke w/ pt he states that [facility] is requesting more information from the provider, I am going to call and speak w/ Terri to see what is needed. Pt aware of my next step and verbalized understanding

## 2022-05-26 NOTE — Telephone Encounter (Signed)
Patient called to check on status of Micheal Ross referral. States [facility] has not received information from Dr. Juanetta Gosling office (last visit in 12/2021) and wants to get supplies as soon as possible.  Patient feels frustrated and would like to receive call and advice

## 2022-05-30 ENCOUNTER — Other Ambulatory Visit: Payer: Self-pay | Admitting: *Deleted

## 2022-05-30 MED ORDER — CHLORTHALIDONE 25 MG PO TABS: 12.5000 mg | ORAL_TABLET | ORAL | 3 refills | Status: DC

## 2022-05-31 ENCOUNTER — Telehealth: Payer: Self-pay

## 2022-05-31 NOTE — Telephone Encounter (Signed)
Pt approved for Pantoprazole for the following dates: 05/31/2022 to 05/31/2023. Pt made aware and given to Manuela Schwartz for scan to the pt's chart.

## 2022-05-31 NOTE — Telephone Encounter (Signed)
PA done on Cover my meds for Pantoprazole 40 mg tab. Dx used: K21.9. medication approved. Messaged the pt in his MyChart regarding this.

## 2022-06-05 ENCOUNTER — Ambulatory Visit: Payer: 59 | Attending: Cardiology

## 2022-06-05 DIAGNOSIS — I272 Pulmonary hypertension, unspecified: Secondary | ICD-10-CM

## 2022-06-05 MED ORDER — PERFLUTREN LIPID MICROSPHERE
1.0000 mL | INTRAVENOUS | Status: AC | PRN
  Administered 2022-06-05: 2 mL via INTRAVENOUS

## 2022-06-06 ENCOUNTER — Other Ambulatory Visit: Payer: Self-pay | Admitting: *Deleted

## 2022-06-06 DIAGNOSIS — M47816 Spondylosis without myelopathy or radiculopathy, lumbar region: Secondary | ICD-10-CM | POA: Diagnosis not present

## 2022-06-06 DIAGNOSIS — G4733 Obstructive sleep apnea (adult) (pediatric): Secondary | ICD-10-CM | POA: Diagnosis not present

## 2022-06-06 LAB — ECHOCARDIOGRAM COMPLETE
AR max vel: 1.87 cm2
AV Area VTI: 1.97 cm2
AV Area mean vel: 1.86 cm2
AV Mean grad: 6 mmHg
AV Peak grad: 10 mmHg
Ao pk vel: 1.58 m/s
Area-P 1/2: 4.06 cm2
Calc EF: 68 %
S' Lateral: 2.8 cm
Single Plane A2C EF: 67.4 %
Single Plane A4C EF: 68.9 %

## 2022-06-06 MED ORDER — CHLORTHALIDONE 25 MG PO TABS: 12.5000 mg | ORAL_TABLET | ORAL | 1 refills | Status: DC

## 2022-06-06 MED ORDER — LISINOPRIL 40 MG PO TABS: 40.0000 mg | ORAL_TABLET | Freq: Every day | ORAL | 1 refills | Status: DC

## 2022-06-06 MED ORDER — AMLODIPINE BESYLATE 10 MG PO TABS: 10.0000 mg | ORAL_TABLET | Freq: Every day | ORAL | 1 refills | Status: DC

## 2022-06-07 ENCOUNTER — Telehealth: Payer: Self-pay | Admitting: Neurology

## 2022-06-07 NOTE — Telephone Encounter (Signed)
Ok to keep NCS on April 4th

## 2022-06-07 NOTE — Telephone Encounter (Signed)
Called pt informed him it's okay to keep NCS for tomorrow. Pt said okay he will be here.

## 2022-06-07 NOTE — Telephone Encounter (Signed)
Pt called stated he went and got a steroid injection in his back yesterday and just wants to know if it okay to still get Nerve Conduction Study tomorrow, Pt is requesting a call back

## 2022-06-08 ENCOUNTER — Ambulatory Visit (INDEPENDENT_AMBULATORY_CARE_PROVIDER_SITE_OTHER): Payer: 59 | Admitting: Neurology

## 2022-06-08 VITALS — BP 127/85 | HR 92 | Ht 70.0 in | Wt 308.0 lb

## 2022-06-08 DIAGNOSIS — M5412 Radiculopathy, cervical region: Secondary | ICD-10-CM

## 2022-06-08 DIAGNOSIS — G5601 Carpal tunnel syndrome, right upper limb: Secondary | ICD-10-CM

## 2022-06-08 DIAGNOSIS — G5620 Lesion of ulnar nerve, unspecified upper limb: Secondary | ICD-10-CM

## 2022-06-08 NOTE — Patient Instructions (Addendum)
Carpal tunnel refer to ortho near [place] not dr Lorin Mercy unless can't find anyone else Carpal Tunnel Syndrome  Carpal tunnel syndrome is a condition that causes pain, numbness, and weakness in your hand and fingers. The carpal tunnel is a narrow area located on the palm side of your wrist. Repeated wrist motion or certain diseases may cause swelling within the tunnel. This swelling pinches the main nerve in the wrist. The main nerve in the wrist is called the median nerve. What are the causes? This condition may be caused by: Repeated and forceful wrist and hand motions. Wrist injuries. Arthritis. A cyst or tumor in the carpal tunnel. Fluid buildup during pregnancy. Use of tools that vibrate. Sometimes the cause of this condition is not known. What increases the risk? The following factors may make you more likely to develop this condition: Having a job that requires you to repeatedly or forcefully move your wrist or hand or requires you to use tools that vibrate. This may include jobs that involve using computers, working on an [employer], or working with [place] such as Pension scheme manager. Being a woman. Having certain conditions, such as: Diabetes. Obesity. An underactive thyroid (hypothyroidism). Kidney failure. Rheumatoid arthritis. What are the signs or symptoms? Symptoms of this condition include: A tingling feeling in your fingers, especially in your thumb, index, and middle fingers. Tingling or numbness in your hand. An aching feeling in your entire arm, especially when your wrist and elbow are bent for a long time. Wrist pain that goes up your arm to your shoulder. Pain that goes down into your palm or fingers. A weak feeling in your hands. You may have trouble grabbing and holding items. Your symptoms may feel worse during the night. How is this diagnosed? This condition is diagnosed with a medical history and physical exam. You may also have tests,  including: Electromyogram (EMG). This test measures electrical signals sent by your nerves into the muscles. Nerve conduction study. This test measures how well electrical signals pass through your nerves. Imaging tests, such as X-rays, ultrasound, and MRI. These tests check for possible causes of your condition. How is this treated? This condition may be treated with: Lifestyle changes. It is important to stop or change the activity that caused your condition. Doing exercise and activities to strengthen and stretch your muscles and tendons (physical therapy). Making lifestyle changes to help with your condition and learning how to do your daily activities safely (occupational therapy). Medicines for pain and inflammation. This may include medicine that is injected into your wrist. A wrist splint or brace. Surgery. Follow these instructions at home: If you have a splint or brace: Wear the splint or brace as told by your health care provider. Remove it only as told by your health care provider. Loosen the splint or brace if your fingers tingle, become numb, or turn cold and blue. Keep the splint or brace clean. If the splint or brace is not waterproof: Do not let it get wet. Cover it with a watertight covering when you take a bath or shower. Managing pain, stiffness, and swelling If directed, put ice on the painful area. To do this: If you have a removeable splint or brace, remove it as told by your health care provider. Put ice in a plastic bag. Place a towel between your skin and the bag or between the splint or brace and the bag. Leave the ice on for 20 minutes, 2-3 times a day. Do not  fall asleep with the cold pack on your skin. Remove the ice if your skin turns bright red. This is very important. If you cannot feel pain, heat, or cold, you have a greater risk of damage to the area. Move your fingers often to reduce stiffness and swelling. General instructions Take over-the-counter  and prescription medicines only as told by your health care provider. Rest your wrist and hand from any activity that may be causing your pain. If your condition is work related, talk with your employer about changes that can be made, such as getting a wrist pad to use while typing. Do any exercises as told by your health care provider, physical therapist, or occupational therapist. Keep all follow-up visits. This is important. Contact a health care provider if: You have new symptoms. Your pain is not controlled with medicines. Your symptoms get worse. Get help right away if: You have severe numbness or tingling in your wrist or hand. Summary Carpal tunnel syndrome is a condition that causes pain, numbness, and weakness in your hand and fingers. It is usually caused by repeated wrist motions. Lifestyle changes and medicines are used to treat carpal tunnel syndrome. Surgery may be recommended. Follow your health care provider's instructions about wearing a splint, resting from activity, keeping follow-up visits, and calling for help. This information is not intended to replace advice given to you by your health care provider. Make sure you discuss any questions you have with your health care provider. Document Revised: 07/03/2019 Document Reviewed: 07/03/2019 Elsevier Patient Education  [place].  Ulnar Neuropathy         Cubital tunnel syndrome is a condition that causes pain and weakness of the forearm and hand. It happens when one of the nerves that runs along the inside of the elbow joint (ulnar nerve) becomes irritated. This condition is usually caused by repeated arm motions that are done during sports or work-related activities. What are the causes? This condition may be caused by: Increased pressure on the ulnar nerve at the elbow, arm, or forearm. This can result from: Irritation caused by repeated elbow bending. Poorly healed elbow fractures. Tumors in the elbow.  These are usually noncancerous (benign). Scar tissue that develops in the elbow after an injury. Bony growths (spurs) near the ulnar nerve. Stretching of the nerve due to loose elbow ligaments. Trauma to the nerve at the elbow. What increases the risk? The following factors may make you more likely to develop this condition: Doing manual labor that requires frequent bending of the elbow. Playing sports that include repeated or strenuous throwing motions, such as baseball. Playing contact sports, such as football or lacrosse. Not warming up properly before activities. Having diabetes. Having an underactive thyroid (hypothyroidism). What are the signs or symptoms? Symptoms of this condition include: Clumsiness or weakness of the hand. Tenderness of the inner elbow. Aching or soreness of the inner elbow, forearm, or fingers, especially the little finger or the ring finger. Increased pain when forcing the elbow to bend. Reduced control when throwing objects. Tingling, numbness, or a burning feeling inside the forearm or in part of the hand or fingers, especially the little finger or the ring finger. Sharp pains that shoot from the elbow down to the wrist and hand. The inability to grip or pinch hard. How is this diagnosed? This condition is diagnosed based on: Your symptoms and medical history. Your health care provider will also ask for details about any injury. A physical exam. You may also  have tests, including: Electromyogram (EMG). This test measures electrical signals sent by your nerves into the muscles. Nerve conduction study. This test measures how well electrical signals pass through your nerves. Imaging tests, such as X-rays, ultrasound, and MRI. These tests check for possible causes of your condition. How is this treated? This condition may be treated by: Stopping the activities that are causing your symptoms to get worse. Icing and taking medicines to reduce pain and  swelling. Wearing a splint to prevent your elbow from bending, or wearing an elbow pad where the ulnar nerve is closest to the skin. Working with a physical therapist in less severe cases. This may help to: Decrease your symptoms. Improve the strength and range of motion of your elbow, forearm, and hand. If these treatments do not help, surgery may be needed. Follow these instructions at home: If you have a splint: Wear the splint as told by your health care provider. Remove it only as told by your health care provider. Loosen the splint if your fingers tingle, become numb, or turn cold and blue. Keep the splint clean. If the splint is not waterproof: Do not let it get wet. Cover it with a watertight covering when you take a bath or shower. Managing pain, stiffness, and swelling  If directed, put ice on the injured area: Put ice in a plastic bag. Place a towel between your skin and the bag. Leave the ice on for 20 minutes, 2-3 times a day. Move your fingers often to avoid stiffness and to lessen swelling. Raise (elevate) the injured area above the level of your heart while you are sitting or lying down. General instructions Take over-the-counter and prescription medicines only as told by your health care provider. Do any exercise or physical therapy as told by your health care provider. Do not drive or use heavy machinery while taking prescription pain medicine. If you were given an elbow pad, wear it as told by your health care provider. Keep all follow-up visits as told by your health care provider. This is important. Contact a health care provider if: Your symptoms get worse. Your symptoms do not get better with treatment. You have new pain. Your hand on the injured side feels numb or cold. Summary Cubital tunnel syndrome is a condition that causes pain and weakness of the forearm and hand. You are more likely to develop this condition if you do work or play sports that involve  repeated arm movements. This condition is often treated by stopping repetitive activities, applying ice, and using anti-inflammatory medicines. In rare cases, surgery may be needed. This information is not intended to replace advice given to you by your health care provider. Make sure you discuss any questions you have with your health care provider. Document Revised: 04/13/2020 Document Reviewed: 04/14/2020 Elsevier Patient Education  [place].

## 2022-06-08 NOTE — Progress Notes (Signed)
Carpal tunnel Carpal Tunnel Syndrome  Carpal tunnel syndrome is a condition that causes pain, numbness, and weakness in your hand and fingers. The carpal tunnel is a narrow area located on the palm side of your wrist. Repeated wrist motion or certain diseases may cause swelling within the tunnel. This swelling pinches the main nerve in the wrist. The main nerve in the wrist is called the median nerve. What are the causes? This condition may be caused by: Repeated and forceful wrist and hand motions. Wrist injuries. Arthritis. A cyst or tumor in the carpal tunnel. Fluid buildup during pregnancy. Use of tools that vibrate. Sometimes the cause of this condition is not known. What increases the risk? The following factors may make you more likely to develop this condition: Having a job that requires you to repeatedly or forcefully move your wrist or hand or requires you to use tools that vibrate. This may include jobs that involve using computers, working on an [employer], or working with [place] such as Pension scheme manager. Being a woman. Having certain conditions, such as: Diabetes. Obesity. An underactive thyroid (hypothyroidism). Kidney failure. Rheumatoid arthritis. What are the signs or symptoms? Symptoms of this condition include: A tingling feeling in your fingers, especially in your thumb, index, and middle fingers. Tingling or numbness in your hand. An aching feeling in your entire arm, especially when your wrist and elbow are bent for a long time. Wrist pain that goes up your arm to your shoulder. Pain that goes down into your palm or fingers. A weak feeling in your hands. You may have trouble grabbing and holding items. Your symptoms may feel worse during the night. How is this diagnosed? This condition is diagnosed with a medical history and physical exam. You may also have tests, including: Electromyogram (EMG). This test measures electrical signals sent by your  nerves into the muscles. Nerve conduction study. This test measures how well electrical signals pass through your nerves. Imaging tests, such as X-rays, ultrasound, and MRI. These tests check for possible causes of your condition. How is this treated? This condition may be treated with: Lifestyle changes. It is important to stop or change the activity that caused your condition. Doing exercise and activities to strengthen and stretch your muscles and tendons (physical therapy). Making lifestyle changes to help with your condition and learning how to do your daily activities safely (occupational therapy). Medicines for pain and inflammation. This may include medicine that is injected into your wrist. A wrist splint or brace. Surgery. Follow these instructions at home: If you have a splint or brace: Wear the splint or brace as told by your health care provider. Remove it only as told by your health care provider. Loosen the splint or brace if your fingers tingle, become numb, or turn cold and blue. Keep the splint or brace clean. If the splint or brace is not waterproof: Do not let it get wet. Cover it with a watertight covering when you take a bath or shower. Managing pain, stiffness, and swelling If directed, put ice on the painful area. To do this: If you have a removeable splint or brace, remove it as told by your health care provider. Put ice in a plastic bag. Place a towel between your skin and the bag or between the splint or brace and the bag. Leave the ice on for 20 minutes, 2-3 times a day. Do not fall asleep with the cold pack on your skin. Remove the ice if your  skin turns bright red. This is very important. If you cannot feel pain, heat, or cold, you have a greater risk of damage to the area. Move your fingers often to reduce stiffness and swelling. General instructions Take over-the-counter and prescription medicines only as told by your health care provider. Rest your wrist  and hand from any activity that may be causing your pain. If your condition is work related, talk with your employer about changes that can be made, such as getting a wrist pad to use while typing. Do any exercises as told by your health care provider, physical therapist, or occupational therapist. Keep all follow-up visits. This is important. Contact a health care provider if: You have new symptoms. Your pain is not controlled with medicines. Your symptoms get worse. Get help right away if: You have severe numbness or tingling in your wrist or hand. Summary Carpal tunnel syndrome is a condition that causes pain, numbness, and weakness in your hand and fingers. It is usually caused by repeated wrist motions. Lifestyle changes and medicines are used to treat carpal tunnel syndrome. Surgery may be recommended. Follow your health care provider's instructions about wearing a splint, resting from activity, keeping follow-up visits, and calling for help. This information is not intended to replace advice given to you by your health care provider. Make sure you discuss any questions you have with your health care provider. Document Revised: 07/03/2019 Document Reviewed: 07/03/2019 Elsevier Patient Education  [place].  Ulnar Neuropathy         Cubital tunnel syndrome is a condition that causes pain and weakness of the forearm and hand. It happens when one of the nerves that runs along the inside of the elbow joint (ulnar nerve) becomes irritated. This condition is usually caused by repeated arm motions that are done during sports or work-related activities. What are the causes? This condition may be caused by: Increased pressure on the ulnar nerve at the elbow, arm, or forearm. This can result from: Irritation caused by repeated elbow bending. Poorly healed elbow fractures. Tumors in the elbow. These are usually noncancerous (benign). Scar tissue that develops in the elbow after  an injury. Bony growths (spurs) near the ulnar nerve. Stretching of the nerve due to loose elbow ligaments. Trauma to the nerve at the elbow. What increases the risk? The following factors may make you more likely to develop this condition: Doing manual labor that requires frequent bending of the elbow. Playing sports that include repeated or strenuous throwing motions, such as baseball. Playing contact sports, such as football or lacrosse. Not warming up properly before activities. Having diabetes. Having an underactive thyroid (hypothyroidism). What are the signs or symptoms? Symptoms of this condition include: Clumsiness or weakness of the hand. Tenderness of the inner elbow. Aching or soreness of the inner elbow, forearm, or fingers, especially the little finger or the ring finger. Increased pain when forcing the elbow to bend. Reduced control when throwing objects. Tingling, numbness, or a burning feeling inside the forearm or in part of the hand or fingers, especially the little finger or the ring finger. Sharp pains that shoot from the elbow down to the wrist and hand. The inability to grip or pinch hard. How is this diagnosed? This condition is diagnosed based on: Your symptoms and medical history. Your health care provider will also ask for details about any injury. A physical exam. You may also have tests, including: Electromyogram (EMG). This test measures electrical signals sent by your nerves  into the muscles. Nerve conduction study. This test measures how well electrical signals pass through your nerves. Imaging tests, such as X-rays, ultrasound, and MRI. These tests check for possible causes of your condition. How is this treated? This condition may be treated by: Stopping the activities that are causing your symptoms to get worse. Icing and taking medicines to reduce pain and swelling. Wearing a splint to prevent your elbow from bending, or wearing an elbow pad where  the ulnar nerve is closest to the skin. Working with a physical therapist in less severe cases. This may help to: Decrease your symptoms. Improve the strength and range of motion of your elbow, forearm, and hand. If these treatments do not help, surgery may be needed. Follow these instructions at home: If you have a splint: Wear the splint as told by your health care provider. Remove it only as told by your health care provider. Loosen the splint if your fingers tingle, become numb, or turn cold and blue. Keep the splint clean. If the splint is not waterproof: Do not let it get wet. Cover it with a watertight covering when you take a bath or shower. Managing pain, stiffness, and swelling  If directed, put ice on the injured area: Put ice in a plastic bag. Place a towel between your skin and the bag. Leave the ice on for 20 minutes, 2-3 times a day. Move your fingers often to avoid stiffness and to lessen swelling. Raise (elevate) the injured area above the level of your heart while you are sitting or lying down. General instructions Take over-the-counter and prescription medicines only as told by your health care provider. Do any exercise or physical therapy as told by your health care provider. Do not drive or use heavy machinery while taking prescription pain medicine. If you were given an elbow pad, wear it as told by your health care provider. Keep all follow-up visits as told by your health care provider. This is important. Contact a health care provider if: Your symptoms get worse. Your symptoms do not get better with treatment. You have new pain. Your hand on the injured side feels numb or cold. Summary Cubital tunnel syndrome is a condition that causes pain and weakness of the forearm and hand. You are more likely to develop this condition if you do work or play sports that involve repeated arm movements. This condition is often treated by stopping repetitive activities,  applying ice, and using anti-inflammatory medicines. In rare cases, surgery may be needed. This information is not intended to replace advice given to you by your health care provider. Make sure you discuss any questions you have with your health care provider. Document Revised: 04/13/2020 Document Reviewed: 04/14/2020 Elsevier Patient Education  [place].

## 2022-06-11 NOTE — Progress Notes (Unsigned)
Full Name: Micheal Ross Gender: Male MRN #: [id number] Date of Birth: [birth year]    Visit Date: 06/08/2022 11:37 Age: 62 Years Examining Physician: Dr. Naomie Dean Referring Physician: Dr. Windell Norfolk Height: 5 feet 10 inch  History: This is a patient with a history of left carpal tunnel release surgery.  He is here today for evaluation of numbness in the left hand digits 4 and 5 and numbness in the right hand in all the fingers.  He denies any neck pain or radicular symptoms. +Tinel's sign at both elbows and right wrist.   Summary:     Nerve Conduction Studies were performed on the bilateral upper extremities.  The right Median 2nd Digit orthodromic sensory nerve showed prolonged distal peak latency (4.0 ms, N<3.4) and reduced amplitude(5 uV, N>10) The left  Median 2nd Digit orthodromic sensory nerve showed prolonged distal peak latency (3.9 ms, N<3.4) and reduced amplitude(8 uV, N>10) The right median/ulnar (palm) comparison nerve showed prolonged distal peak latency (Median Palm, 2.7 ms, N<2.2) and abnormal peak latency difference (Median Palm-Ulnar Palm, 0.9 ms, N<0.4) with a relative median delay.   The left median/ulnar (palm) comparison nerve showed prolonged distal peak latency (Median Palm, 2.8 ms, N<2.2) and abnormal peak latency difference (Median Palm-Ulnar Palm, 1.0 ms, N<0.4) with a relative median delay.   The left ulnar ADM motor nerve showed a conduction block across the elbow of 1.2 mV (normal less than 1.1) The left ulnar FDI showed a conduction block across the elbow of 2.1 mV (normal less than 1.1) and a 23 m/s drop across the elbow (normal less than 10 m/s). The right ulnar FDI showed delayed distal onset latency (4.7 ms, normal less than 4.5). The right ulnar orthodromic digit 5 sensory conduction showed delayed distal peak latency (3.7 ms, less than 3.1). The left ulnar orthodromic digit 5 sensory conduction showed delayed distal peak latency (3.1 ms,  less than 3.1). The right ulnar ADM F wave showed delayed latency of 34.2 ms, normal less than 32 and the left ulnar ADM F wave showed delayed latency 32.7, normal less than 32. All remaining nerves (as indicated in the following tables) were within normal limits.    The left flexor digitorum profundus showed increased duration, polyphasic motor units and reduced motor recruitment pattern.  The right flexor digitorum profundus showed reduced recruitment.  The left first dorsal interosseous showed polyphasic motor units and reduced motor unit recruitment.  The right first dorsal interosseous showed polyphasic motor units and reduced motor unit recruitment.  The left ADM showed increased spontaneous activity.  The right ADM showed increased spontaneous activity, polyphasic motor units and reduced motor unit recruitment.All remaining muscles (as indicated in the following tables) were within normal limits.       Conclusion:  This is an abnormal study. 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  Abnormal nerve conductions in the left median nerves likely due to prior carpal tunnel syndrome status post decompression as he is asymptomatic for left carpal tunnel syndrome. 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   No denervation in the proximal muscles or cervical paraspinal muscles to suggest cervical radiculopathy.     ------------------------------- Naomie Dean, M.D.    [facility] [phone]  [address] [place], [place] [address] Tel: [phone] Fax: [phone]  Verbal informed consent was obtained from the patient, patient was informed of potential risk of procedure, including  bruising, bleeding, hematoma formation, infection, muscle weakness, muscle pain, numbness, among others.        MNC    Nerve / Sites Muscle Latency Ref. Amplitude Ref. Rel Amp Segments Distance Velocity Ref. Area    ms ms mV mV %  cm m/s m/s mVms  R Median - APB     Wrist APB 4.1 ?4.4 6.9 ?4.0 100 Wrist - APB 7   30.8     Upper arm APB 8.8  6.6  95.3 Upper arm - Wrist 25 54 ?49 27.7  L Median - APB     Wrist APB 4.3 ?4.4 8.6 ?4.0 100 Wrist - APB 7   35.8     Upper arm APB 9.1  8.1  94.5 Upper arm - Wrist 24 50 ?49 33.1  R Ulnar - ADM     Wrist ADM 2.9 ?3.3 8.1 ?6.0 100 Wrist - ADM 7   20.9     B.Elbow ADM 6.2  6.8  84.1 B.Elbow - Wrist 16 48 ?49 17.5     A.Elbow ADM 9.6  6.1  89.9 A.Elbow - B.Elbow 17 50 ?49 16.8  L Ulnar - ADM     Wrist ADM 3.0 ?3.3 10.0 ?6.0 100 Wrist - ADM 7   36.6     B.Elbow ADM 6.0  9.5  95.5 B.Elbow - Wrist 16 53 ?49 34.7     A.Elbow ADM 9.1  8.3  87.6 A.Elbow - B.Elbow 15 49 ?49 32.5  L Ulnar - FDI     Wrist FDI 4.0 ?4.5 8.7 ?7.0 100 Wrist - FDI 8   24.4     B.Elbow FDI 6.0  9.5  109 B.Elbow - Wrist 11.6 59 ?49 27.0     A.Elbow FDI 9.6  7.4  78.2 A.Elbow - B.Elbow 13 36 ?49 21.8         A.Elbow - Wrist      R Ulnar - FDI     Wrist FDI 4.7 ?4.5 7.6 ?7.0 100 Wrist - FDI 8   22.4     B.Elbow FDI 7.1  5.8  76.3 B.Elbow - Wrist 12 49 ?49 16.0     A.Elbow FDI 11.0  6.9  119 A.Elbow - B.Elbow 16 41 ?49 20.8         A.Elbow - Wrist                     SNC    Nerve / Sites Rec. Site Peak Lat Ref.  Amp Ref. Segments Distance Peak Diff Ref.    ms ms V V  cm ms ms  L Radial - Anatomical snuff box (Forearm)     Forearm Wrist 2.9 ?2.9 18 ?15 Forearm - Wrist 10    R Radial - Anatomical snuff box (Forearm)     Forearm Wrist 2.5 ?2.9 21 ?15 Forearm - Wrist 10    L Median, Ulnar - Transcarpal comparison     Median Palm Wrist 2.8 ?2.2 41 ?35 Median Palm - Wrist 8       Ulnar Palm Wrist 1.8 ?2.2 11 ?12 Ulnar Palm - Wrist 8          Median Palm - Ulnar Palm  1.0 ?0.4   R Median, Ulnar - Transcarpal comparison     Median [employer]  Wrist 2.7 ?2.2 36 ?35 Median Palm - Wrist 8       Ulnar Palm Wrist 1.8 ?2.2 13 ?12 Ulnar Palm - Wrist 8          Median Palm - Ulnar Palm  0.9 ?0.4  R Median - Orthodromic (Dig II, Mid palm)     Dig II Wrist 4.0 ?3.4 5 ?10 Dig II - Wrist 13    L Median - Orthodromic (Dig II, Mid palm)     Dig II Wrist 3.9 ?3.4 8 ?10 Dig II - Wrist 13    R Ulnar - Orthodromic, (Dig V, Mid palm)     Dig V Wrist 3.7 ?3.1 5 ?5 Dig V - Wrist 11    L Ulnar - Orthodromic, (Dig V, Mid palm)     Dig V Wrist 3.3 ?3.1 5 ?5 Dig V - Wrist 33                       F  Wave    Nerve F Lat Ref.   ms ms  R Ulnar - ADM 34.2 ?32.0  L Ulnar - ADM 32.7 ?32.0         EMG Summary Table    Spontaneous MUAP Recruitment  Muscle IA Fib PSW Fasc Other Amp Dur. Poly Pattern  L. Deltoid Normal None None None _______ Normal Normal Normal Normal  R. Deltoid Normal None None None _______ Normal Normal Normal Normal  L. Triceps brachii Normal None None None _______ Normal Normal Normal Normal  R. Triceps brachii Normal None None None _______ Normal Normal Normal Normal  L. Flexor digitorum profundus (Ulnar) Normal None None None _______ Normal Increased 2+ Reduced  R. Flexor digitorum profundus (Ulnar) Normal None None None _______ Normal Normal Normal Reduced  L. Pronator teres Normal None None None _______ Normal Normal Normal Normal  R. Pronator teres Normal Could not tolerate         L. Extensor indicis proprius Normal None None None _______ Normal Normal Normal Normal  R. Extensor indicis proprius Normal None None None _______ Normal Normal Normal Normal  L. Extensor digitorum communis Normal None None None _______ Normal Normal Normal Normal  R. Extensor digitorum communis Normal None None None _______ Normal Normal Normal Normal  L. Cervical paraspinals (low) Normal None None None _______ Normal Normal Normal Normal  R. Cervical paraspinals (low) Normal None None  None _______ Normal Normal Normal Normal  L. First dorsal interosseous Normal None None None _______ Normal Normal 1+ Reduced  R. First dorsal interosseous Normal None None None _______ Normal Increased 3+ Reduced  L. Opponens pollicis Normal Could not tolerate         R. Opponens pollicis Normal None None None _______ Normal Normal Normal Normal  L.  ADM Normal None 1+ None _______ Normal Normal Normal Normal  R.  ADM Normal None 1+ None _______ Normal Normal 3+ Reduced

## 2022-06-12 DIAGNOSIS — G5620 Lesion of ulnar nerve, unspecified upper limb: Secondary | ICD-10-CM | POA: Insufficient documentation

## 2022-06-12 DIAGNOSIS — G5601 Carpal tunnel syndrome, right upper limb: Secondary | ICD-10-CM | POA: Insufficient documentation

## 2022-06-12 NOTE — Procedures (Signed)
Full Name: Micheal Ross Gender: Male MRN #: [id number] Date of Birth: February 25, 1961    Visit Date: 06/08/2022 11:37 Age: 62 Years Examining Physician: Dr. Naomie Dean Referring Physician: Dr. Windell Norfolk Height: 5 feet 10 inch  History: This is a patient with a history of left carpal tunnel release surgery.  He is here today for evaluation of numbness in the left hand digits 4 and 5 and numbness in the right hand in all the fingers.  He denies any neck pain or radicular symptoms. +Tinel's sign at both elbows and right wrist.   Summary:     Nerve Conduction Studies were performed on the bilateral upper extremities.  The right Median 2nd Digit orthodromic sensory nerve showed prolonged distal peak latency (4.0 ms, N<3.4) and reduced amplitude(5 uV, N>10) The left  Median 2nd Digit orthodromic sensory nerve showed prolonged distal peak latency (3.9 ms, N<3.4) and reduced amplitude(8 uV, N>10) The right median/ulnar (palm) comparison nerve showed prolonged distal peak latency (Median Palm, 2.7 ms, N<2.2) and abnormal peak latency difference (Median Palm-Ulnar Palm, 0.9 ms, N<0.4) with a relative median delay.   The left median/ulnar (palm) comparison nerve showed prolonged distal peak latency (Median Palm, 2.8 ms, N<2.2) and abnormal peak latency difference (Median Palm-Ulnar Palm, 1.0 ms, N<0.4) with a relative median delay.   The left ulnar ADM motor nerve showed a conduction block across the elbow of 1.2 mV (normal less than 1.1) The left ulnar FDI showed a conduction block across the elbow of 2.1 mV (normal less than 1.1) and a 23 m/s drop across the elbow (normal less than 10 m/s). The right ulnar FDI showed delayed distal onset latency (4.7 ms, normal less than 4.5). The right ulnar orthodromic digit 5 sensory conduction showed delayed distal peak latency (3.7 ms, less than 3.1). The left ulnar orthodromic digit 5 sensory conduction showed delayed distal peak latency (3.1 ms,  less than 3.1). The right ulnar ADM F wave showed delayed latency of 34.2 ms, normal less than 32 and the left ulnar ADM F wave showed delayed latency 32.7, normal less than 32. All remaining nerves (as indicated in the following tables) were within normal limits.    The left flexor digitorum profundus showed increased duration, polyphasic motor units and reduced motor recruitment pattern.  The right flexor digitorum profundus showed reduced recruitment.  The left first dorsal interosseous showed polyphasic motor units and reduced motor unit recruitment.  The right first dorsal interosseous showed polyphasic motor units and reduced motor unit recruitment.  The left ADM showed increased spontaneous activity.  The right ADM showed increased spontaneous activity, polyphasic motor units and reduced motor unit recruitment.All remaining muscles (as indicated in the following tables) were within normal limits.       Conclusion:  This is an abnormal study. 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  Abnormal nerve conductions in the left median nerves likely due to prior carpal tunnel syndrome status post decompression as he is asymptomatic for left carpal tunnel syndrome. 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   No denervation in the proximal muscles or cervical paraspinal muscles to suggest cervical radiculopathy.     ------------------------------- Naomie Dean, M.D.    [facility] [phone]  [address] [place], [place] [address] Tel: [phone] Fax: [phone]  Verbal informed consent was obtained from the patient, patient was informed of potential risk of procedure, including  bruising, bleeding, hematoma formation, infection, muscle weakness, muscle pain, numbness, among others.        MNC    Nerve / Sites Muscle Latency Ref. Amplitude Ref. Rel Amp Segments Distance Velocity Ref. Area    ms ms mV mV %  cm m/s m/s mVms  R Median - APB     Wrist APB 4.1 ?4.4 6.9 ?4.0 100 Wrist - APB 7   30.8     Upper arm APB 8.8  6.6  95.3 Upper arm - Wrist 25 54 ?49 27.7  L Median - APB     Wrist APB 4.3 ?4.4 8.6 ?4.0 100 Wrist - APB 7   35.8     Upper arm APB 9.1  8.1  94.5 Upper arm - Wrist 24 50 ?49 33.1  R Ulnar - ADM     Wrist ADM 2.9 ?3.3 8.1 ?6.0 100 Wrist - ADM 7   20.9     B.Elbow ADM 6.2  6.8  84.1 B.Elbow - Wrist 16 48 ?49 17.5     A.Elbow ADM 9.6  6.1  89.9 A.Elbow - B.Elbow 17 50 ?49 16.8  L Ulnar - ADM     Wrist ADM 3.0 ?3.3 10.0 ?6.0 100 Wrist - ADM 7   36.6     B.Elbow ADM 6.0  9.5  95.5 B.Elbow - Wrist 16 53 ?49 34.7     A.Elbow ADM 9.1  8.3  87.6 A.Elbow - B.Elbow 15 49 ?49 32.5  L Ulnar - FDI     Wrist FDI 4.0 ?4.5 8.7 ?7.0 100 Wrist - FDI 8   24.4     B.Elbow FDI 6.0  9.5  109 B.Elbow - Wrist 11.6 59 ?49 27.0     A.Elbow FDI 9.6  7.4  78.2 A.Elbow - B.Elbow 13 36 ?49 21.8         A.Elbow - Wrist      R Ulnar - FDI     Wrist FDI 4.7 ?4.5 7.6 ?7.0 100 Wrist - FDI 8   22.4     B.Elbow FDI 7.1  5.8  76.3 B.Elbow - Wrist 12 49 ?49 16.0     A.Elbow FDI 11.0  6.9  119 A.Elbow - B.Elbow 16 41 ?49 20.8         A.Elbow - Wrist                     SNC    Nerve / Sites Rec. Site Peak Lat Ref.  Amp Ref. Segments Distance Peak Diff Ref.    ms ms V V  cm ms ms  L Radial - Anatomical snuff box (Forearm)     Forearm Wrist 2.9 ?2.9 18 ?15 Forearm - Wrist 10    R Radial - Anatomical snuff box (Forearm)     Forearm Wrist 2.5 ?2.9 21 ?15 Forearm - Wrist 10    L Median, Ulnar - Transcarpal comparison     Median Palm Wrist 2.8 ?2.2 41 ?35 Median Palm - Wrist 8       Ulnar Palm Wrist 1.8 ?2.2 11 ?12 Ulnar Palm - Wrist 8          Median Palm - Ulnar Palm  1.0 ?0.4   R Median, Ulnar - Transcarpal comparison     Median [employer]  Wrist 2.7 ?2.2 36 ?35 Median Palm - Wrist 8       Ulnar Palm Wrist 1.8 ?2.2 13 ?12 Ulnar Palm - Wrist 8          Median Palm - Ulnar Palm  0.9 ?0.4  R Median - Orthodromic (Dig II, Mid palm)     Dig II Wrist 4.0 ?3.4 5 ?10 Dig II - Wrist 13    L Median - Orthodromic (Dig II, Mid palm)     Dig II Wrist 3.9 ?3.4 8 ?10 Dig II - Wrist 13    R Ulnar - Orthodromic, (Dig V, Mid palm)     Dig V Wrist 3.7 ?3.1 5 ?5 Dig V - Wrist 11    L Ulnar - Orthodromic, (Dig V, Mid palm)     Dig V Wrist 3.3 ?3.1 5 ?5 Dig V - Wrist 33                       F  Wave    Nerve F Lat Ref.   ms ms  R Ulnar - ADM 34.2 ?32.0  L Ulnar - ADM 32.7 ?32.0         EMG Summary Table    Spontaneous MUAP Recruitment  Muscle IA Fib PSW Fasc Other Amp Dur. Poly Pattern  L. Deltoid Normal None None None _______ Normal Normal Normal Normal  R. Deltoid Normal None None None _______ Normal Normal Normal Normal  L. Triceps brachii Normal None None None _______ Normal Normal Normal Normal  R. Triceps brachii Normal None None None _______ Normal Normal Normal Normal  L. Flexor digitorum profundus (Ulnar) Normal None None None _______ Normal Increased 2+ Reduced  R. Flexor digitorum profundus (Ulnar) Normal None None None _______ Normal Normal Normal Reduced  L. Pronator teres Normal None None None _______ Normal Normal Normal Normal  R. Pronator teres Normal Could not tolerate         L. Extensor indicis proprius Normal None None None _______ Normal Normal Normal Normal  R. Extensor indicis proprius Normal None None None _______ Normal Normal Normal Normal  L. Extensor digitorum communis Normal None None None _______ Normal Normal Normal Normal  R. Extensor digitorum communis Normal None None None _______ Normal Normal Normal Normal  L. Cervical paraspinals (low) Normal None None None _______ Normal Normal Normal Normal  R. Cervical paraspinals (low) Normal None None  None _______ Normal Normal Normal Normal  L. First dorsal interosseous Normal None None None _______ Normal Normal 1+ Reduced  R. First dorsal interosseous Normal None None None _______ Normal Increased 3+ Reduced  L. Opponens pollicis Normal Could not tolerate         R. Opponens pollicis Normal None None None _______ Normal Normal Normal Normal  L.  ADM Normal None 1+ None _______ Normal Normal Normal Normal  R.  ADM Normal None 1+ None _______ Normal Normal 3+ Reduced

## 2022-06-12 NOTE — Addendum Note (Signed)
Addended by: Naomie Dean B on: 06/12/2022 09:10 PM   Modules accepted: Orders, Level of Service

## 2022-06-12 NOTE — Progress Notes (Signed)
History: This is a patient with a history of left carpal tunnel release surgery. He is here today for evaluation of numbness in the left hand digits 4 and 5 and numbness in the right hand in all the fingers. He denies any neck pain or radicular symptoms. +Tinel's sign at both elbows and right wrist.  No cervical radicular symptoms.  No symptoms in the feet.    I discussed the following results with them.  As suggested there was right carpal tunnel syndrome(prior left CTS release). Both hands had numbness in digits 4-5 and we found evidence of ulnar neuropathy bilaterally across the elbows.   We reviewed images online, diagrams, pathophysiology, treatment, conservative and invasive.  +Tinel's sign in the elbows and right wrist. He would like to go to an orthopaedist in [place].   Orders Placed This Encounter  Procedures   Ambulatory referral to Orthopedic Surgery      Conclusion:  This is an abnormal study. 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  Abnormal nerve conductions in the left median nerves likely due to prior carpal tunnel syndrome status post decompression as he is asymptomatic for left carpal tunnel syndrome. 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   No denervation in the proximal muscles or cervical paraspinal muscles to suggest cervical radiculopathy.       I spent 20 minutes of face-to-face and non-face-to-face time with patient on the  1. Cervical radiculopathy    diagnosis.  This included previsit chart review, lab review, study review, order entry, electronic health record documentation, patient education on the different diagnostic and therapeutic options,  counseling and coordination of care, risks and benefits of management, compliance, or risk factor reduction. This does not include time spent on emg/ncs.

## 2022-06-19 ENCOUNTER — Other Ambulatory Visit: Payer: Self-pay | Admitting: Neurology

## 2022-06-19 ENCOUNTER — Encounter (INDEPENDENT_AMBULATORY_CARE_PROVIDER_SITE_OTHER): Payer: 59 | Admitting: Neurology

## 2022-06-19 DIAGNOSIS — M5412 Radiculopathy, cervical region: Secondary | ICD-10-CM

## 2022-06-19 DIAGNOSIS — M542 Cervicalgia: Secondary | ICD-10-CM

## 2022-06-19 DIAGNOSIS — M25512 Pain in left shoulder: Secondary | ICD-10-CM

## 2022-06-19 NOTE — Telephone Encounter (Signed)
I spoke with patient, answered all his questions.

## 2022-06-21 NOTE — Telephone Encounter (Signed)
Can you please contact patient insurance and inquire if the medication GLP1 inhibitor is covered for weight loss. Patient BMI of 45 with history of hypertension, sleep apnea and history of meningioma resection.

## 2022-06-21 NOTE — Telephone Encounter (Signed)
  I attempted to call [employer] and the line was busy. But I have looked on the formulary and website and it says a PA may be required, but he does meet the requirement for approval

## 2022-06-21 NOTE — Telephone Encounter (Signed)
Sounds good

## 2022-06-26 ENCOUNTER — Encounter: Payer: Self-pay | Admitting: *Deleted

## 2022-06-29 ENCOUNTER — Ambulatory Visit: Payer: 59 | Admitting: Orthopaedic Surgery

## 2022-06-29 ENCOUNTER — Encounter: Payer: Self-pay | Admitting: Orthopaedic Surgery

## 2022-06-29 VITALS — Ht 70.0 in | Wt 308.0 lb

## 2022-06-29 DIAGNOSIS — G5623 Lesion of ulnar nerve, bilateral upper limbs: Secondary | ICD-10-CM | POA: Diagnosis not present

## 2022-06-29 DIAGNOSIS — G5622 Lesion of ulnar nerve, left upper limb: Secondary | ICD-10-CM | POA: Insufficient documentation

## 2022-06-29 DIAGNOSIS — G5601 Carpal tunnel syndrome, right upper limb: Secondary | ICD-10-CM | POA: Diagnosis not present

## 2022-06-29 NOTE — Progress Notes (Signed)
Office Visit Note   Patient: Micheal Ross.           Date of Birth: [birth year]           MRN: [id number] Visit Date: 06/29/2022              Requested by: Anson Fret, MD [address] [place],  [place] [address] PCP: Kirstie Peri, MD   Assessment & Plan: Visit Diagnoses:  1. Carpal tunnel syndrome of right wrist   2. 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  He also has moderate right carpal tunnel but not really bothering him currently.  He like proceed with left cubital tunnel release this to be done in outpatient local anesthesia with MAC.  He like to have this done at [facility] outpatient day surgery center.  Procedure discussed electrical test reviewed.  Follow-Up Instructions: No follow-ups on file.   Orders:  No orders of the defined types were placed in this encounter.  No orders of the defined types were placed in this encounter.     Procedures: No procedures performed   Clinical Data: No additional findings.   Subjective: Chief Complaint  Patient presents with   Right Hand - Numbness   Left Hand - Numbness    HPI 62 year old male returns now almost 1 year post left carpal tunnel release.  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  He is only symptomatic on the left at this point.  Patient has had x-rays of his neck which showed multilevel spondylitic changes straightening of the cervical spine which was noted on a CT scan that was done for some prominence of supraclavicular tissue that turned out to be adipose without evidence of lymphadenopathy.  Patient states the left ulnar 1 and half fingers wakes him up every night multiple  times.  He drops objects since noticed lack of strength.  Review of Systems past history meningioma.  Previous left carpal tunnel release 08/15/2021 under MAC without problems.  He is on no treatment for hyperlipidemia hypertension.   Objective: Vital Signs: Ht  (1.778 m)   Wt (!) 308 lb (139.7 kg)   BMI 44.19 kg/m   Physical Exam Constitutional:      Appearance: He is well-developed.  HENT:     Head: Normocephalic and atraumatic.     Right Ear: External ear normal.     Left Ear: External ear normal.  Eyes:     Pupils: Pupils are equal, round, and reactive to light.  Neck:     Thyroid: No thyromegaly.     Trachea: No tracheal deviation.  Cardiovascular:     Rate and Rhythm: Normal rate.  Pulmonary:     Effort: Pulmonary effort is normal.     Breath sounds: No wheezing.  Abdominal:     General: Bowel sounds are normal.     Palpations: Abdomen is soft.  Musculoskeletal:     Cervical back: Neck supple.  Skin:    General: Skin is warm and dry.     Capillary Refill: Capillary refill takes less than 2 seconds.  Neurological:     Mental Status: He is alert and oriented to person, place,  and time.  Psychiatric:        Behavior: Behavior normal.        Thought Content: Thought content normal.        Judgment: Judgment normal.     Ortho Exam positive Tinel's left elbow negative on the right no ulnar nerve subluxation.  Well-healed left carpal tunnel incision no pain with carpal compression negative Phalen's on the left.  Positive Phalen's on the right with mild thenar weakness on the right.  Negative Spurling right and left no brachial plexus tenderness right or left.  Specialty Comments:         Conclusion:  This is an abnormal study. 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  Abnormal nerve conductions in the left median nerves likely due to prior carpal tunnel syndrome status post decompression as he is asymptomatic for  left carpal tunnel syndrome. 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   No denervation in the proximal muscles or cervical paraspinal muscles to suggest cervical radiculopathy.      ------------------------------- Naomie Dean, M.D.    [facility] [address] [place], [place] [address] Tel: [phone] Fax: [phone]  Imaging: No results found.   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   Rectal bleeding 01/15/2019   Gastroesophageal reflux disease 01/15/2019   Shortness of breath 01/15/2019   H/O adenomatous polyp of colon 11/14/2018   Hypertension 03/29/2018   Hyperlipidemia 03/29/2018   Meningioma 09/20/2012   Past Medical History:  Diagnosis Date   Brain tumor    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)   Hypercholesterolemia    Hypertension    Neck pain    Shoulder pain    Sleep apnea    uses bipap at home    Family History  Problem Relation Age of Onset   Thyroid disease Mother    COPD Father    Cancer Maternal Aunt    Cancer Maternal Uncle        back   Cancer Paternal Uncle        throat    Cancer Cousin        prostate   Colon cancer Neg Hx    Colon polyps Neg Hx    Allergic rhinitis Neg Hx     Angioedema Neg Hx    Asthma Neg Hx    Eczema Neg Hx     Past Surgical History:  Procedure Laterality Date   ANKLE SURGERY     BIOPSY  04/21/2022   Procedure: BIOPSY;  Surgeon: Lanelle Bal, DO;  [facility];  Service: Endoscopy;;   brain tumor removal  2014   CARPAL TUNNEL RELEASE Left 08/15/2021   Procedure: LEFT CARPAL TUNNEL RELEASE;  Surgeon: Eldred Manges, MD;  Location: [facility];  Service: Orthopedics;  Laterality: Left;   COLONOSCOPY WITH PROPOFOL N/A 01/13/2019   Procedure: COLONOSCOPY WITH PROPOFOL;  Surgeon: Corbin Ade, MD;  20 mm polyp found in the cecum that was removed via piecemeal hot snare polypectomy. Repeat in 4 months. path tubulovillous adenoma, negative for high-grade dysplasia   COLONOSCOPY WITH PROPOFOL N/A 01/20/2019   Procedure: COLONOSCOPY WITH PROPOFOL;  Surgeon: West Bali, MD; large defect (2 cm x 1.5 cm with small visible vessel with stigmata of recent bleed) in the cecum.  3 hemostatic clips placed (MR conditional).  One 3 mm polyp in the cecum, tortuous colon, external and internal hemorrhoids.  Pathology revealed tubular adenoma.   COLONOSCOPY WITH PROPOFOL N/A 10/06/2021   Procedure: COLONOSCOPY WITH PROPOFOL;  Surgeon: Lanelle Bal, DO;  [facility];  Service: Endoscopy;  Laterality: N/A;  7:30am   ESOPHAGOGASTRODUODENOSCOPY (EGD) WITH PROPOFOL N/A 04/21/2022   Procedure: ESOPHAGOGASTRODUODENOSCOPY (EGD) WITH PROPOFOL;  Surgeon: Lanelle Bal, DO;  [facility];  Service: Endoscopy;  Laterality: N/A;  145pm, asa 3, pt can't come earlier - transportation   HERNIA REPAIR  2010   umbilical   NASAL SEPTOPLASTY W/ TURBINOPLASTY Bilateral 11/30/2020   Procedure: NASAL SEPTOPLASTY WITH BILATERAL  TURBINATE REDUCTION;  Surgeon: Newman Pies, MD;  Location: [facility];  Service: ENT;  Laterality: Bilateral;   POLYPECTOMY  01/13/2019   Procedure: POLYPECTOMY;  Surgeon: Corbin Ade, MD;  [facility];  Service: Endoscopy;;  Large Cecal Polyp    POLYPECTOMY  10/06/2021   Procedure: POLYPECTOMY;  Surgeon: Lanelle Bal, DO;  [facility];  Service: Endoscopy;;   SINOSCOPY     Social History   Occupational History   Not on file  Tobacco Use   Smoking status: Former    Packs/day: 0.50    Years: 2.00    Additional pack years: 0.00    Total pack years: 1.00    Types: Cigarettes    Quit date: 03/30/1983    Years since quitting: 39.2    Passive exposure: Past   Smokeless tobacco: Never  Vaping Use   Vaping Use: Never used  Substance and Sexual Activity   Alcohol use: Not Currently    Comment: occ   Drug use: No   Sexual activity: Yes    Comment: married for 1985, 6 grown kids

## 2022-06-30 ENCOUNTER — Other Ambulatory Visit: Payer: Self-pay | Admitting: *Deleted

## 2022-06-30 MED ORDER — LISINOPRIL 40 MG PO TABS: 20.0000 mg | ORAL_TABLET | Freq: Every day | ORAL | Status: DC

## 2022-06-30 NOTE — Telephone Encounter (Signed)
Not a common side effect. Also typically if medication related symptoms would resolve much quicker than 2 months being off. Can try lowering his lisinopril for the time being, can lower to 20mg  daily. Update [name] on symptoms in 2 weeks  Dominga Ferry MD

## 2022-07-03 ENCOUNTER — Telehealth: Payer: Self-pay | Admitting: Neurology

## 2022-07-03 ENCOUNTER — Other Ambulatory Visit: Payer: Self-pay | Admitting: Neurology

## 2022-07-03 ENCOUNTER — Encounter: Payer: Self-pay | Admitting: *Deleted

## 2022-07-03 ENCOUNTER — Other Ambulatory Visit: Payer: Self-pay | Admitting: *Deleted

## 2022-07-03 MED ORDER — LISINOPRIL 20 MG PO TABS: 20.0000 mg | ORAL_TABLET | Freq: Every day | ORAL | 6 refills | Status: DC

## 2022-07-03 NOTE — Telephone Encounter (Signed)
Referral faxed to [facility] (fax# [phone], phone# [phone])

## 2022-07-03 NOTE — Telephone Encounter (Signed)
Referral is in. Thanks.

## 2022-07-05 ENCOUNTER — Ambulatory Visit (INDEPENDENT_AMBULATORY_CARE_PROVIDER_SITE_OTHER): Payer: 59 | Admitting: Internal Medicine

## 2022-07-05 ENCOUNTER — Encounter: Payer: Self-pay | Admitting: Internal Medicine

## 2022-07-05 ENCOUNTER — Ambulatory Visit: Payer: 59 | Admitting: Pulmonary Disease

## 2022-07-05 VITALS — BP 122/83 | HR 88 | Temp 98.3°F | Ht 70.0 in | Wt 317.0 lb

## 2022-07-05 DIAGNOSIS — K5904 Chronic idiopathic constipation: Secondary | ICD-10-CM | POA: Diagnosis not present

## 2022-07-05 DIAGNOSIS — B9681 Helicobacter pylori [H. pylori] as the cause of diseases classified elsewhere: Secondary | ICD-10-CM

## 2022-07-05 DIAGNOSIS — K279 Peptic ulcer, site unspecified, unspecified as acute or chronic, without hemorrhage or perforation: Secondary | ICD-10-CM | POA: Diagnosis not present

## 2022-07-05 DIAGNOSIS — D122 Benign neoplasm of ascending colon: Secondary | ICD-10-CM | POA: Diagnosis not present

## 2022-07-05 NOTE — Patient Instructions (Signed)
I am happy to hear that you are feeling better.  I am going to order H. pylori breath test at [facility] to ensure that we have eradicated the infection.  We will call with results.  Follow-up in 6 months or sooner if needed.  It is always a pleasure seeing you.  Dr. Marletta Lor

## 2022-07-05 NOTE — Telephone Encounter (Signed)
BP's look fine, please update [name] in 1 week after taking the lower dose of lisinopril and also update [name] on any ongoing symptoms   J Orion Vandervort MD

## 2022-07-05 NOTE — Progress Notes (Signed)
Referring Provider: Kirstie Peri, MD Primary Care Physician:  Kirstie Peri, MD Primary GI:  Dr. Marletta Lor  Chief Complaint  Patient presents with   post procedure follow up    Follow up on EGD. Finished h pylori treatment and reports he is doing better.    HPI:   Micheal Ross. is a 62 y.o. male who presents to clinic today for follow-up visit.  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  Underwent surveillance colonoscopy 10/06/2021 with multiple small tubular adenomas removed, recall 5 years.  Seen for epigastric pain and constipation prior. Underwent EGD, which showed gastritis and gastric ulcer.  Biopsies positive for H. pylori.  Esophageal biopsies with acid reflux changes.  Completed 2-week course of quad therapy with tetracycline, metronidazole, bismuth, pantoprazole twice daily.  States he stopped his pantoprazole 2 weeks ago.  States his abdominal pain is vastly improved.  No reflux or heartburn.  No dysphagia odynophagia.    Constipation also improved on as needed MiraLAX.  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)   Hypercholesterolemia    Hypertension    Neck pain    Shoulder pain    Sleep apnea    uses bipap at home    Past Surgical History:  Procedure Laterality Date   ANKLE SURGERY     BIOPSY  04/21/2022   Procedure: BIOPSY;  Surgeon: Lanelle Bal, DO;  [facility];  Service: Endoscopy;;   brain tumor removal  2014   CARPAL TUNNEL RELEASE Left 08/15/2021   Procedure: LEFT CARPAL TUNNEL RELEASE;  Surgeon: Eldred Manges, MD;  Location: [facility];  Service: Orthopedics;  Laterality: Left;   COLONOSCOPY WITH PROPOFOL N/A 01/13/2019   Procedure: COLONOSCOPY WITH PROPOFOL;  Surgeon: Corbin Ade, MD;  20 mm polyp found in the cecum that was removed via piecemeal hot snare polypectomy. Repeat in 4 months. path tubulovillous adenoma,  negative for high-grade dysplasia   COLONOSCOPY WITH PROPOFOL N/A 01/20/2019   Procedure: COLONOSCOPY WITH PROPOFOL;  Surgeon: West Bali, MD; large defect (2 cm x 1.5 cm with small visible vessel with stigmata of recent bleed) in the cecum.  3 hemostatic clips placed (MR conditional).  One 3 mm polyp in the cecum, tortuous colon, external and internal hemorrhoids.  Pathology revealed tubular adenoma.   COLONOSCOPY WITH PROPOFOL N/A 10/06/2021   Procedure: COLONOSCOPY WITH PROPOFOL;  Surgeon: Lanelle Bal, DO;  [facility];  Service: Endoscopy;  Laterality: N/A;  7:30am   ESOPHAGOGASTRODUODENOSCOPY (EGD) WITH PROPOFOL N/A 04/21/2022   Procedure: ESOPHAGOGASTRODUODENOSCOPY (EGD) WITH PROPOFOL;  Surgeon: Lanelle Bal, DO;  [facility];  Service: Endoscopy;  Laterality: N/A;  145pm, asa 3, pt can't come earlier - transportation   HERNIA REPAIR  2010   umbilical   NASAL SEPTOPLASTY W/ TURBINOPLASTY Bilateral 11/30/2020   Procedure: NASAL SEPTOPLASTY WITH BILATERAL  TURBINATE REDUCTION;  Surgeon: Newman Pies, MD;  Location: [facility];  Service: ENT;  Laterality: Bilateral;   POLYPECTOMY  01/13/2019   Procedure: POLYPECTOMY;  Surgeon: Corbin Ade, MD;  [facility];  Service: Endoscopy;;  Large Cecal Polyp    POLYPECTOMY  10/06/2021   Procedure: POLYPECTOMY;  Surgeon: Lanelle Bal, DO;  [facility];  Service: Endoscopy;;   SINOSCOPY      Current Outpatient Medications  Medication Sig Dispense Refill  amLODipine (NORVASC) 10 MG tablet Take 1 tablet (10 mg total) by mouth daily. 90 tablet 1   azelastine (ASTELIN) 0.1 % nasal spray Place 1 spray into both nostrils 2 (two) times daily. Use in each nostril as directed 30 mL 5   chlorthalidone (HYGROTON) 25 MG tablet Take 0.5 tablets (12.5 mg total) by mouth every other day. 45 tablet 1   lisinopril (ZESTRIL) 20 MG tablet Take 1 tablet (20 mg total) by mouth daily. 30  tablet 6   Olopatadine HCl 0.2 % SOLN Apply 1 drop to eye daily as needed. 2.5 mL 5   pregabalin (LYRICA) 150 MG capsule Take 150 mg by mouth 2 (two) times daily.     rosuvastatin (CRESTOR) 5 MG tablet Take 5 mg by mouth at bedtime.     pantoprazole (PROTONIX) 40 MG tablet Take 1 tablet (40 mg total) by mouth 2 (two) times daily. (Patient not taking: Reported on 07/05/2022) 60 tablet 11   No current facility-administered medications for this visit.    Allergies as of 07/05/2022 - Review Complete 07/05/2022  Allergen Reaction Noted   Methocarbamol Other (See Comments) 10/12/2021    Family History  Problem Relation Age of Onset   Thyroid disease Mother    COPD Father    Cancer Maternal Aunt    Cancer Maternal Uncle        back   Cancer Paternal Uncle        throat    Cancer Cousin        prostate   Colon cancer Neg Hx    Colon polyps Neg Hx    Allergic rhinitis Neg Hx    Angioedema Neg Hx    Asthma Neg Hx    Eczema Neg Hx     Social History   Socioeconomic History   Marital status: Married    Spouse name: Not on file   Number of children: Not on file   Years of education: Not on file   Highest education level: Not on file  Occupational History   Not on file  Tobacco Use   Smoking status: Former    Packs/day: 0.50    Years: 2.00    Additional pack years: 0.00    Total pack years: 1.00    Types: Cigarettes    Quit date: 03/30/1983    Years since quitting: 39.2    Passive exposure: Past   Smokeless tobacco: Never  Vaping Use   Vaping Use: Never used  Substance and Sexual Activity   Alcohol use: Not Currently    Comment: occ   Drug use: No   Sexual activity: Yes    Comment: married for 1985, 6 grown kids  Other Topics Concern   Not on file  Social History Narrative   Not on file   Social Determinants of Health   Financial Resource Strain: Not on file  Food Insecurity: Not on file  Transportation Needs: Not on file  Physical Activity: Not on file   Stress: Not on file  Social Connections: Not on file    Subjective: Review of Systems  Constitutional:  Negative for chills and fever.  HENT:  Negative for congestion and hearing loss.   Eyes:  Negative for blurred vision and double vision.  Respiratory:  Negative for cough and shortness of breath.   Cardiovascular:  Negative for chest pain and palpitations.  Gastrointestinal:  Negative for abdominal pain, blood in stool, constipation, diarrhea, heartburn, melena and vomiting.  Genitourinary:  Negative for  dysuria and urgency.  Musculoskeletal:  Negative for joint pain and myalgias.  Skin:  Negative for itching and rash.  Neurological:  Negative for dizziness and headaches.  Psychiatric/Behavioral:  Negative for depression. The patient is not nervous/anxious.      Objective: BP 122/83 (BP Location: Left Arm, Patient Position: Sitting, Cuff Size: Large)   Pulse 88   Temp 98.3 F (36.8 C) (Oral)   Ht 5\' 10"  (1.778 m)   Wt (!) 317 lb (143.8 kg)   BMI 45.48 kg/m  Physical Exam Constitutional:      Appearance: Normal appearance. He is obese.  HENT:     Head: Normocephalic and atraumatic.  Eyes:     Extraocular Movements: Extraocular movements intact.     Conjunctiva/sclera: Conjunctivae normal.  Cardiovascular:     Rate and Rhythm: Normal rate and regular rhythm.  Pulmonary:     Effort: Pulmonary effort is normal.     Breath sounds: Normal breath sounds.  Abdominal:     General: Bowel sounds are normal.     Palpations: Abdomen is soft.  Musculoskeletal:        General: Normal range of motion.     Right upper arm: Swelling present.     Left upper arm: Swelling present.       Arms:     Cervical back: Normal range of motion and neck supple.  Skin:    General: Skin is warm.  Neurological:     General: No focal deficit present.     Mental Status: He is alert and oriented to person, place, and time.  Psychiatric:        Mood and Affect: Mood normal.        Behavior:  Behavior normal.      Assessment: *H. pylori gastritis/ulcer *Chronic idiopathic constipation *Adenomatous colon polyps  Plan: Patient has completed quad therapy for H. pylori gastritis/ulcer.  Discussed eradication testing with him today.  He has not taken PPI in 2 weeks.  Will order breath testing at [facility] and call with results.  Okay to remain off of PPI therapy then may need to reinstitute if begins having symptoms again.  Constipation improved.  Continue to monitor.  Colonoscopy recall 2028.  Follow-up in 6 months or sooner if needed.  07/05/2022 11:11 AM   Disclaimer: This note was dictated with voice recognition software. Similar sounding words can inadvertently be transcribed and may not be corrected upon review.

## 2022-07-07 ENCOUNTER — Other Ambulatory Visit: Payer: Self-pay | Admitting: Physician Assistant

## 2022-07-07 ENCOUNTER — Encounter: Payer: Self-pay | Admitting: Pulmonary Disease

## 2022-07-07 ENCOUNTER — Ambulatory Visit: Payer: 59 | Admitting: Pulmonary Disease

## 2022-07-07 VITALS — BP 113/76 | HR 87 | Ht 70.0 in | Wt 316.0 lb

## 2022-07-07 DIAGNOSIS — G4733 Obstructive sleep apnea (adult) (pediatric): Secondary | ICD-10-CM

## 2022-07-07 DIAGNOSIS — J310 Chronic rhinitis: Secondary | ICD-10-CM

## 2022-07-07 DIAGNOSIS — K279 Peptic ulcer, site unspecified, unspecified as acute or chronic, without hemorrhage or perforation: Secondary | ICD-10-CM | POA: Diagnosis not present

## 2022-07-07 DIAGNOSIS — B9681 Helicobacter pylori [H. pylori] as the cause of diseases classified elsewhere: Secondary | ICD-10-CM | POA: Diagnosis not present

## 2022-07-07 MED ORDER — AZELASTINE HCL 0.15 % NA SOLN: 1.0000 | Freq: Every day | NASAL | Status: DC

## 2022-07-07 NOTE — Progress Notes (Signed)
   Compliance DL for Ov w/ VS  Verified by [facility] 07/07/2022

## 2022-07-07 NOTE — Patient Instructions (Signed)
Will have [facility] change your Bipap setting to 18/13 cm water pressure  You can try using astepro 1 spray in each nostril daily as needed to help with sinus congestion  Follow up in 1 year

## 2022-07-07 NOTE — Progress Notes (Signed)
B dm  [facility]  Chief Complaint  Patient presents with   Follow-up    Pt f/u states that his machine is working well but when he removes mask in the morning his head feels like "it has fluid" in it.     Past Surgical History:  He  has a past surgical history that includes brain tumor removal (2014); Ankle surgery; Hernia repair (2010); Colonoscopy with propofol (N/A, 01/13/2019); polypectomy (01/13/2019); Colonoscopy with propofol (N/A, 01/20/2019); Nasal septoplasty w/ turbinoplasty (Bilateral, 11/30/2020); Carpal tunnel release (Left, 08/15/2021); Colonoscopy with propofol (N/A, 10/06/2021); polypectomy (10/06/2021); Sinoscopy; Esophagogastroduodenoscopy (egd) with propofol (N/A, 04/21/2022); and biopsy (04/21/2022).  Past Medical History:  Meningioma, HLD, HTN, Neck pain  Constitutional:  BP 113/76   Pulse 87   Ht 5\' 10"  (1.778 m)   Wt (!) 316 lb (143.3 kg)   SpO2 96%   BMI 45.34 kg/m   Brief Summary:  Micheal Ross. is a 62 y.o. male with obstructive sleep apnea.      Subjective:   Uses Bipap nightly.  Has full face mask.  Gets congestion and pressure in his sinuses.  Has to adjust his mask.  Not having water build up in the mask or tube.  Has been using flonase.  Has mild dry mouth.  No cough or chest congestion.  Not having bloating from Bipap.  Physical Exam:   Appearance - well kempt   ENMT - no sinus tenderness, no oral exudate, no LAN, Mallampati 3 airway, no stridor  Respiratory - equal breath sounds bilaterally, no wheezing or rales  CV - s1s2 regular rate and rhythm, no murmurs  Ext - no clubbing, no edema  Skin - no rashes  Psych - normal mood and affect    Sleep Tests:  PSG 03/28/19 >> AHI 105.6, SpO2 low 69% Bipap 06/07/22 to 07/06/22 >> used on 30 of 30 nights with average 16 hrs 19 min.  Average AHI 0.5 with Bipap 20/14 cm H2O  Social History:  He  reports that he quit smoking about 39 years ago.  His smoking use included cigarettes. He has a 1.00 pack-year smoking history. He has been exposed to tobacco smoke. He has never used smokeless tobacco. He reports that he does not currently use alcohol. He reports that he does not use drugs.  Family History:  His family history includes COPD in his father; Cancer in his cousin, maternal aunt, maternal uncle, and paternal uncle; Thyroid disease in his mother.     Assessment/Plan:   Obstructive sleep apnea. - he is compliant with Bipap and reports benefit from therapy - uses [facility] for his DME - will change Bipap to 18/13 cm H2O  CPAP rhinitis. - continue flonase - change CPAP pressure - prn astepro  Obesity. - discussed how weight can impact sleep and risk for sleep disordered breathing - discussed options to assist with weight loss: combination of diet modification, cardiovascular and strength training exercises  Time Spent Involved in Patient Care on Day of Examination:  25 minutes  Follow up:   Patient Instructions  Will have [facility] change your Bipap setting to 18/13 cm water pressure  You can try using astepro 1 spray in each nostril daily as needed to help with sinus congestion  Follow up in 1 year  Medication List:   Allergies as of 07/07/2022       Reactions   Methocarbamol Other (See Comments)   Twitching in sleep  Medication List        Accurate as of Jul 07, 2022 10:28 AM. If you have any questions, ask your nurse or doctor.          STOP taking these medications    azelastine 0.1 % nasal spray Commonly known as: ASTELIN Replaced by: Azelastine HCl 0.15 % Soln Stopped by: Coralyn Helling, MD   pantoprazole 40 MG tablet Commonly known as: Protonix Stopped by: Coralyn Helling, MD       TAKE these medications    amLODipine 10 MG tablet Commonly known as: NORVASC Take 1 tablet (10 mg total) by mouth daily.   Azelastine HCl 0.15 % Soln Commonly known as: Astepro Place 1 spray into the  nose daily in the afternoon. Replaces: azelastine 0.1 % nasal spray Started by: Coralyn Helling, MD   chlorthalidone 25 MG tablet Commonly known as: HYGROTON Take 0.5 tablets (12.5 mg total) by mouth every other day.   lisinopril 20 MG tablet Commonly known as: ZESTRIL Take 1 tablet (20 mg total) by mouth daily.   Olopatadine HCl 0.2 % Soln Apply 1 drop to eye daily as needed.   pregabalin 150 MG capsule Commonly known as: LYRICA Take 150 mg by mouth 2 (two) times daily.   rosuvastatin 5 MG tablet Commonly known as: CRESTOR Take 5 mg by mouth at bedtime.        Signature:  Coralyn Helling, MD [facility] Pager - [phone] 07/07/2022, 10:28 AM

## 2022-07-09 LAB — H. PYLORI BREATH TEST: H pylori Breath Test: NEGATIVE

## 2022-07-10 NOTE — Telephone Encounter (Signed)
  Thank you for your message seeking medical advice.* My assessment and recommendation are as follows: As you restart the Pregabalin who will have side effect of dizziness and lightheadedness. Please take you time when going from sitting to standing. Please also increase your water intake  These symptoms should get better by the end of the week.   Sincerely,  Windell Norfolk, MD    *This exchange required the expertise of a doctor, nurse practitioner, physician assistant, optometrist or certified nurse midwife and qualifies as a Medical Advice Message, please visit [email or web address] for more details. [place] will bill your insurance on your behalf; copays and deductibles may apply. Questions? Reply to this message.

## 2022-07-10 NOTE — Telephone Encounter (Signed)

## 2022-07-10 NOTE — Therapy (Incomplete)
OUTPATIENT PHYSICAL THERAPY CERVICAL EVALUATION   Patient Name: Micheal Ross. MRN: [id number] DOB:02-11-1961, 62 y.o., male Today's Date: 07/11/2022  END OF SESSION:  PT End of Session - 07/11/22 1148     Visit Number 1    Number of Visits 30    Authorization Type [employer]    Authorization - Visit Number 1    Authorization - Number of Visits 30    Progress Note Due on Visit 14    PT Start Time 1031    PT Stop Time 1116    PT Time Calculation (min) 45 min    Activity Tolerance Patient tolerated treatment well    Behavior During Therapy WFL for tasks assessed/performed             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)   Hypercholesterolemia    Hypertension    Neck pain    Shoulder pain    Sleep apnea    uses bipap at home   Past Surgical History:  Procedure Laterality Date   ANKLE SURGERY     BIOPSY  04/21/2022   Procedure: BIOPSY;  Surgeon: Lanelle Bal, DO;  [facility];  Service: Endoscopy;;   brain tumor removal  2014   CARPAL TUNNEL RELEASE Left 08/15/2021   Procedure: LEFT CARPAL TUNNEL RELEASE;  Surgeon: Eldred Manges, MD;  Location: [facility];  Service: Orthopedics;  Laterality: Left;   COLONOSCOPY WITH PROPOFOL N/A 01/13/2019   Procedure: COLONOSCOPY WITH PROPOFOL;  Surgeon: Corbin Ade, MD;  20 mm polyp found in the cecum that was removed via piecemeal hot snare polypectomy. Repeat in 4 months. path tubulovillous adenoma, negative for high-grade dysplasia   COLONOSCOPY WITH PROPOFOL N/A 01/20/2019   Procedure: COLONOSCOPY WITH PROPOFOL;  Surgeon: West Bali, MD; large defect (2 cm x 1.5 cm with small visible vessel with stigmata of recent bleed) in the cecum.  3 hemostatic clips placed (MR conditional).  One 3 mm polyp in the cecum, tortuous colon, external and internal hemorrhoids.  Pathology revealed tubular adenoma.   COLONOSCOPY WITH PROPOFOL N/A  10/06/2021   Procedure: COLONOSCOPY WITH PROPOFOL;  Surgeon: Lanelle Bal, DO;  [facility];  Service: Endoscopy;  Laterality: N/A;  7:30am   ESOPHAGOGASTRODUODENOSCOPY (EGD) WITH PROPOFOL N/A 04/21/2022   Procedure: ESOPHAGOGASTRODUODENOSCOPY (EGD) WITH PROPOFOL;  Surgeon: Lanelle Bal, DO;  [facility];  Service: Endoscopy;  Laterality: N/A;  145pm, asa 3, pt can't come earlier - transportation   HERNIA REPAIR  2010   umbilical   NASAL SEPTOPLASTY W/ TURBINOPLASTY Bilateral 11/30/2020   Procedure: NASAL SEPTOPLASTY WITH BILATERAL  TURBINATE REDUCTION;  Surgeon: Newman Pies, MD;  Location: [facility];  Service: ENT;  Laterality: Bilateral;   POLYPECTOMY  01/13/2019   Procedure: POLYPECTOMY;  Surgeon: Corbin Ade, MD;  [facility];  Service: Endoscopy;;  Large Cecal Polyp    POLYPECTOMY  10/06/2021   Procedure: POLYPECTOMY;  Surgeon: Lanelle Bal, DO;  [facility];  Service: Endoscopy;;   SINOSCOPY     Patient Active Problem List   Diagnosis Date Noted   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 06/29/2022   Carpal tunnel syndrome of right wrist 06/12/2022   Ulnar neuropathy at elbow 06/12/2022   Low back pain 10/13/2021   Carpal tunnel syndrome, left upper limb 04/28/2021   Bee sting  12/05/2019   Obstructive sleep apnea 03/31/2019   Acute blood loss anemia 01/20/2019   Tachycardia 01/20/2019   Lower GI bleed    Weakness of both lower extremities 01/15/2019   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   Rectal bleeding 01/15/2019   Gastroesophageal reflux disease 01/15/2019   Shortness of breath 01/15/2019   H/O adenomatous polyp of colon 11/14/2018   Hypertension 03/29/2018   Hyperlipidemia 03/29/2018   Meningioma (HCC) 09/20/2012    PCP: Kirstie Peri MD  REFERRING PROVIDER: Kirstie Peri MD  REFERRING DIAG:  M54.12 (ICD-10-CM) - Cervical radiculopathy  M54.2 (ICD-10-CM) - Cervicalgia  M25.512  (ICD-10-CM) - Left shoulder pain, unspecified chronicity    THERAPY DIAG:  Cervicalgia  Abnormal posture  Chronic left shoulder pain  Rationale for Evaluation and Treatment: Rehabilitation  ONSET DATE: ~ 2 years  SUBJECTIVE:                                                                                                                                                                                                         SUBJECTIVE STATEMENT: Pt reporting that he has been having neck pain for approximately 2 years.  He reported that his neck pain is primarily in his left neck and left upper shoulder blade.  He also reported left mass that started occurring in January of this year and he has seen 3 doctors have regarded the mass as just "fat."  Patient states that the mass will be bigger in the morning get smaller throughout the day. 5-6/10 currently, previously 8-9/10. Reports sharp, sharp pain. Hand dominance: Right  PERTINENT HISTORY:    PAIN:  Are you having pain? Yes: NPRS scale: 6/10 Pain location: Proximal left scapular muscular pain and left lateral neck Pain description: sharp Aggravating factors: tactile pressure, laying on back.  Relieving factors: "don't know"  PRECAUTIONS: None  WEIGHT BEARING RESTRICTIONS: No  FALLS:  Has patient fallen in last 6 months? 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"  NEXT MD VISIT: *assess next session*  OBJECTIVE:   DIAGNOSTIC FINDINGS:    PATIENT SURVEYS:  FOTO 42%  COGNITION: Overall cognitive status: Within functional limits for tasks assessed  SENSATION: WFL  POSTURE: rounded shoulders and forward head  PALPATION: Edematous round mass proximal to left collarbone   CERVICAL ROM:    Active ROM A/PROM (deg) eval  Flexion [facility]  Extension 50%  Right lateral flexion 50%  Left lateral flexion 50%  Right rotation 50%  Left rotation 50%   (Blank rows = not tested)  UPPER EXTREMITY ROM:  Active ROM Right eval Left eval  Shoulder flexion 90% 70%  Shoulder extension    Shoulder abduction 70% 60%  Shoulder adduction    Shoulder extension    Shoulder internal rotation 70% 60%  Shoulder external rotation 70% 60%  Elbow flexion    Elbow extension    Wrist flexion    Wrist extension    Wrist ulnar deviation    Wrist radial deviation    Wrist pronation    Wrist supination     (Blank rows = not tested)  UPPER EXTREMITY MMT:  MMT Right eval Left eval  Shoulder flexion 4+ 3+  Shoulder extension    Shoulder abduction 4- 3+  Shoulder adduction    Shoulder extension    Shoulder internal rotation 3+ 3  Shoulder external rotation 3+ 3  Middle trapezius    Lower trapezius    Elbow flexion    Elbow extension    Wrist flexion    Wrist extension    Wrist ulnar deviation    Wrist radial deviation    Wrist pronation    Wrist supination    Grip strength     (Blank rows = not tested)  CERVICAL SPECIAL TESTS:  Neck flexor muscle endurance test: next session and Upper limb tension test (ULTT): next session  FUNCTIONAL TESTS:    TODAY'S TREATMENT:                                                                                                                              DATE: PT Evaluation, HEP, Attendance Policy.    PATIENT EDUCATION:  Education details: PT evaluation, posture, diet/nutrition/physical activity requirements. Person educated: Patient Education method: Medical illustrator Education comprehension: verbalized understanding  HOME EXERCISE PROGRAM: Access Code: [email or web address] URL: [email or web address] Date: 07/11/2022 Prepared by: Starling Manns  Exercises - Seated Scapular Retraction  - 1 x daily - 7 x weekly - 3 sets  - 10 reps  ASSESSMENT:  CLINICAL IMPRESSION: Pt is a pleasant 62 year old male with who is presenting to physical therapy today for neck and shoulder pain on his left side.  Patient is referred to physical therapy by his PCP for cervicalgia and left shoulder pain. Pt's past medical history includes: Bouts of physical therapy in 2023 for similar symptoms, multiple blood pressure medications, and various checkups for new fat mass on left side of neck.. Pt currently is on disability limited in doing a lot outside the home due to reports of increased dizziness with excessive walking, patient has just started riding recumbent bike at home 3X a week, with changes in diet as well.  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  Based upon today's evaluation, pt is demonstrating  functional activity impairments, limited activity tolerance, impaired upper extremity activities of daily living with little L UE, poor sitting and standing postural performance due to ROM deficits, left UE weakness, myofascial pain and soft tissue mobility.  Patient would benefit from skilled physical therapy services to address the above impairments/limitations and improve functional UE mobility and overall QOL..    OBJECTIVE IMPAIRMENTS: decreased mobility, decreased ROM, decreased strength, hypomobility, increased edema, increased fascial restrictions, impaired flexibility, impaired UE functional use, and postural dysfunction.   ACTIVITY LIMITATIONS: carrying, lifting, sitting, and standing  PARTICIPATION LIMITATIONS: cleaning, laundry, driving, community activity, occupation, and yard work  PERSONAL FACTORS: Age, Behavior pattern, Fitness, and Past/current experiences are also affecting patient's functional outcome.   REHAB POTENTIAL: Good  CLINICAL DECISION MAKING: Evolving/moderate complexity  EVALUATION COMPLEXITY: Moderate   GOALS: Goals reviewed with patient?  No  SHORT TERM GOALS: Target date: 08/22/2022  Patient will be independent with HEP in order to demonstrate participation in Physical Therapy POC.  Baseline: See education section Goal status: INITIAL  2.  Patient will report 4/10 pain at rest to demonstrate improved reduced pain and impact on functional activities. Baseline:  Goal status: INITIAL  LONG TERM GOALS: Target date: 09/12/2022  Patient will report 2/10 pain at rest and with activities to demonstrate improved functional pain tolerance with L UE activities. Baseline: See pain section Goal status: INITIAL  2.  Patient will improved left UE MMT to 4/5 in order to demonstrate improved muscular strength and endurance. Baseline: See objective Goal status: INITIAL  3.  Patient will improve gross cervical ROM to >65% in order to demonstrate improved cervical range of motion and muscle extensibility. Baseline: See objective Goal status: INITIAL  4.  Patient will improve Foto score by >8 points to demonstrate improved functional outcomes and reduced pain levels. Baseline: See objective Goal status: INITIAL   PLAN:  PT FREQUENCY: 1-2x/week  PT DURATION: 6 weeks  PLANNED INTERVENTIONS: Therapeutic exercises, Therapeutic activity, Neuromuscular re-education, Balance training, Gait training, Patient/Family education, Self Care, Joint mobilization, Manual therapy, and Re-evaluation  PLAN FOR NEXT SESSION: Heavy scapular strengthening, rotator cuff strengthening, cervical isometrics   Nelida Meuse, PT 07/11/2022, 11:50 AM

## 2022-07-11 ENCOUNTER — Other Ambulatory Visit: Payer: Self-pay | Admitting: Internal Medicine

## 2022-07-11 ENCOUNTER — Ambulatory Visit (HOSPITAL_COMMUNITY): Payer: 59 | Attending: Neurology

## 2022-07-11 ENCOUNTER — Encounter (HOSPITAL_COMMUNITY): Payer: Self-pay

## 2022-07-11 ENCOUNTER — Other Ambulatory Visit: Payer: Self-pay

## 2022-07-11 DIAGNOSIS — R293 Abnormal posture: Secondary | ICD-10-CM

## 2022-07-11 DIAGNOSIS — M542 Cervicalgia: Secondary | ICD-10-CM | POA: Diagnosis not present

## 2022-07-11 DIAGNOSIS — G8929 Other chronic pain: Secondary | ICD-10-CM | POA: Insufficient documentation

## 2022-07-11 DIAGNOSIS — M5412 Radiculopathy, cervical region: Secondary | ICD-10-CM | POA: Insufficient documentation

## 2022-07-11 DIAGNOSIS — M25512 Pain in left shoulder: Secondary | ICD-10-CM | POA: Diagnosis not present

## 2022-07-12 ENCOUNTER — Telehealth: Payer: Self-pay | Admitting: Radiology

## 2022-07-12 ENCOUNTER — Other Ambulatory Visit: Payer: Self-pay | Admitting: Internal Medicine

## 2022-07-12 NOTE — Telephone Encounter (Signed)
Patient called [facility] office and states that he is having surgery next week but has not heard from any one regarding pre-op appointment.  Please call to advise.  [phone]

## 2022-07-13 ENCOUNTER — Other Ambulatory Visit: Payer: Self-pay

## 2022-07-13 ENCOUNTER — Encounter (HOSPITAL_BASED_OUTPATIENT_CLINIC_OR_DEPARTMENT_OTHER): Payer: Self-pay | Admitting: Orthopaedic Surgery

## 2022-07-13 NOTE — Progress Notes (Signed)
Chart reviewed with Dr. Miguel Rota, anesthesiologist, including patient cardiac and neuro hx. Okay to proceed with surgery as planned, pending any significant changes to patient health per aforesaid.

## 2022-07-13 NOTE — Telephone Encounter (Signed)
Looks like facility has spoken to patient.  I called and left voice mail for return call to me if any additional questions.

## 2022-07-18 ENCOUNTER — Other Ambulatory Visit (HOSPITAL_COMMUNITY)
Admission: RE | Admit: 2022-07-18 | Discharge: 2022-07-18 | Disposition: A | Discharge status: Home / Self Care | Payer: 59 | Source: Ambulatory Visit | Attending: Orthopaedic Surgery | Admitting: Orthopaedic Surgery

## 2022-07-18 ENCOUNTER — Encounter (HOSPITAL_COMMUNITY)
Admission: RE | Admit: 2022-07-18 | Discharge: 2022-07-18 | Disposition: A | Discharge status: Home / Self Care | Payer: 59 | Source: Ambulatory Visit | Attending: Orthopaedic Surgery | Admitting: Orthopaedic Surgery

## 2022-07-18 DIAGNOSIS — Z01812 Encounter for preprocedural laboratory examination: Secondary | ICD-10-CM | POA: Diagnosis not present

## 2022-07-18 LAB — BASIC METABOLIC PANEL
Anion gap: 10 (ref 5–15)
BUN: 11 mg/dL (ref 8–23)
CO2: 28 mmol/L (ref 22–32)
Calcium: 9.2 mg/dL (ref 8.9–10.3)
Chloride: 98 mmol/L (ref 98–111)
Creatinine, Ser: 1.24 mg/dL (ref 0.61–1.24)
GFR, Estimated: >60 mL/min (ref >60)
Glucose, Bld: 105 mg/dL — ABNORMAL HIGH (ref 70–99)
Potassium: 3.8 mmol/L (ref 3.5–5.1)
Sodium: 136 mmol/L (ref 135–145)

## 2022-07-19 ENCOUNTER — Other Ambulatory Visit: Payer: Self-pay

## 2022-07-19 ENCOUNTER — Encounter (HOSPITAL_BASED_OUTPATIENT_CLINIC_OR_DEPARTMENT_OTHER): Payer: Self-pay | Admitting: Orthopaedic Surgery

## 2022-07-19 ENCOUNTER — Ambulatory Visit (HOSPITAL_BASED_OUTPATIENT_CLINIC_OR_DEPARTMENT_OTHER): Payer: 59 | Admitting: Anesthesiology

## 2022-07-19 ENCOUNTER — Encounter (HOSPITAL_BASED_OUTPATIENT_CLINIC_OR_DEPARTMENT_OTHER)
Admission: RE | Disposition: A | Discharge status: Home / Self Care | Payer: Self-pay | Source: Home / Self Care | Attending: Orthopaedic Surgery

## 2022-07-19 ENCOUNTER — Ambulatory Visit (HOSPITAL_BASED_OUTPATIENT_CLINIC_OR_DEPARTMENT_OTHER)
Admission: RE | Admit: 2022-07-19 | Discharge: 2022-07-19 | Disposition: A | Discharge status: Home / Self Care | Payer: 59 | Attending: Orthopaedic Surgery | Admitting: Orthopaedic Surgery

## 2022-07-19 DIAGNOSIS — Z79899 Other long term (current) drug therapy: Secondary | ICD-10-CM | POA: Diagnosis not present

## 2022-07-19 DIAGNOSIS — G5601 Carpal tunnel syndrome, right upper limb: Secondary | ICD-10-CM | POA: Insufficient documentation

## 2022-07-19 DIAGNOSIS — G4730 Sleep apnea, unspecified: Secondary | ICD-10-CM | POA: Diagnosis not present

## 2022-07-19 DIAGNOSIS — I10 Essential (primary) hypertension: Secondary | ICD-10-CM

## 2022-07-19 DIAGNOSIS — G4733 Obstructive sleep apnea (adult) (pediatric): Secondary | ICD-10-CM | POA: Diagnosis not present

## 2022-07-19 DIAGNOSIS — G5623 Lesion of ulnar nerve, bilateral upper limbs: Secondary | ICD-10-CM | POA: Diagnosis not present

## 2022-07-19 DIAGNOSIS — Z01818 Encounter for other preprocedural examination: Secondary | ICD-10-CM

## 2022-07-19 DIAGNOSIS — Z6841 Body Mass Index (BMI) 40.0 and over, adult: Secondary | ICD-10-CM

## 2022-07-19 DIAGNOSIS — Z9989 Dependence on other enabling machines and devices: Secondary | ICD-10-CM | POA: Diagnosis not present

## 2022-07-19 DIAGNOSIS — Z87891 Personal history of nicotine dependence: Secondary | ICD-10-CM | POA: Diagnosis not present

## 2022-07-19 DIAGNOSIS — G5622 Lesion of ulnar nerve, left upper limb: Secondary | ICD-10-CM | POA: Diagnosis present

## 2022-07-19 HISTORY — DX: Dizziness and giddiness: R42

## 2022-07-19 HISTORY — DX: Other visual disturbances: H53.8

## 2022-07-19 HISTORY — PX: ULNAR TUNNEL RELEASE: SHX820

## 2022-07-19 SURGERY — RELEASE, CUBITAL TUNNEL
Anesthesia: General | Site: Arm Upper | Laterality: Left

## 2022-07-19 MED ORDER — LACTATED RINGERS IV SOLN: INTRAVENOUS | Status: DC

## 2022-07-19 MED ORDER — 0.9 % SODIUM CHLORIDE (POUR BTL) OPTIME
TOPICAL | Status: DC | PRN
  Administered 2022-07-19: 50 mL

## 2022-07-19 MED ORDER — OXYCODONE HCL 5 MG/5ML PO SOLN: 5.0000 mg | Freq: Once | ORAL | Status: AC | PRN

## 2022-07-19 MED ORDER — ACETAMINOPHEN 500 MG PO TABS
1000.0000 mg | ORAL_TABLET | Freq: Once | ORAL | Status: AC
  Administered 2022-07-19: 1000 mg via ORAL

## 2022-07-19 MED ORDER — LIDOCAINE HCL (PF) 1 % IJ SOLN
INTRAMUSCULAR | Status: AC
  Filled 2022-07-19: qty 30

## 2022-07-19 MED ORDER — PROPOFOL 10 MG/ML IV BOLUS
INTRAVENOUS | Status: AC
  Filled 2022-07-19: qty 20

## 2022-07-19 MED ORDER — DEXAMETHASONE SODIUM PHOSPHATE 10 MG/ML IJ SOLN
INTRAMUSCULAR | Status: DC | PRN
  Administered 2022-07-19: 10 mg via INTRAVENOUS

## 2022-07-19 MED ORDER — ONDANSETRON HCL 4 MG/2ML IJ SOLN
INTRAMUSCULAR | Status: DC | PRN
  Administered 2022-07-19: 4 mg via INTRAVENOUS

## 2022-07-19 MED ORDER — CEFAZOLIN IN SODIUM CHLORIDE 3-0.9 GM/100ML-% IV SOLN
3.0000 g | INTRAVENOUS | Status: AC
  Administered 2022-07-19: 3 g via INTRAVENOUS

## 2022-07-19 MED ORDER — OXYCODONE HCL 5 MG PO TABS
5.0000 mg | ORAL_TABLET | Freq: Once | ORAL | Status: AC | PRN
  Administered 2022-07-19: 5 mg via ORAL

## 2022-07-19 MED ORDER — PROPOFOL 10 MG/ML IV BOLUS
INTRAVENOUS | Status: DC | PRN
  Administered 2022-07-19: 200 mg via INTRAVENOUS

## 2022-07-19 MED ORDER — AMISULPRIDE (ANTIEMETIC) 5 MG/2ML IV SOLN: 10.0000 mg | Freq: Once | INTRAVENOUS | Status: DC | PRN

## 2022-07-19 MED ORDER — CEFAZOLIN IN SODIUM CHLORIDE 3-0.9 GM/100ML-% IV SOLN
INTRAVENOUS | Status: AC
  Filled 2022-07-19: qty 100

## 2022-07-19 MED ORDER — FENTANYL CITRATE (PF) 100 MCG/2ML IJ SOLN
INTRAMUSCULAR | Status: AC
  Filled 2022-07-19: qty 2

## 2022-07-19 MED ORDER — LIDOCAINE 2% (20 MG/ML) 5 ML SYRINGE
INTRAMUSCULAR | Status: DC | PRN
  Administered 2022-07-19: 100 mg via INTRAVENOUS

## 2022-07-19 MED ORDER — FENTANYL CITRATE (PF) 100 MCG/2ML IJ SOLN
25.0000 ug | INTRAMUSCULAR | Status: DC | PRN
  Administered 2022-07-19: 50 ug via INTRAVENOUS

## 2022-07-19 MED ORDER — MIDAZOLAM HCL 2 MG/2ML IJ SOLN
INTRAMUSCULAR | Status: AC
  Filled 2022-07-19: qty 2

## 2022-07-19 MED ORDER — MIDAZOLAM HCL 5 MG/5ML IJ SOLN
INTRAMUSCULAR | Status: DC | PRN
  Administered 2022-07-19: 2 mg via INTRAVENOUS

## 2022-07-19 MED ORDER — LIDOCAINE HCL 1 % IJ SOLN
INTRAMUSCULAR | Status: DC | PRN
  Administered 2022-07-19: 7 mL

## 2022-07-19 MED ORDER — BUPIVACAINE HCL (PF) 0.25 % IJ SOLN
INTRAMUSCULAR | Status: AC
  Filled 2022-07-19: qty 60

## 2022-07-19 MED ORDER — OXYCODONE-ACETAMINOPHEN 5-325 MG PO TABS: 1.0000 | ORAL_TABLET | Freq: Four times a day (QID) | ORAL | 0 refills | Status: DC | PRN

## 2022-07-19 MED ORDER — OXYCODONE HCL 5 MG PO TABS
ORAL_TABLET | ORAL | Status: AC
  Filled 2022-07-19: qty 1

## 2022-07-19 MED ORDER — ACETAMINOPHEN 500 MG PO TABS
ORAL_TABLET | ORAL | Status: AC
  Filled 2022-07-19: qty 2

## 2022-07-19 MED ORDER — FENTANYL CITRATE (PF) 100 MCG/2ML IJ SOLN
INTRAMUSCULAR | Status: DC | PRN
  Administered 2022-07-19 (×2): 50 ug via INTRAVENOUS

## 2022-07-19 SURGICAL SUPPLY — 47 items
APL SKNCLS STERI-STRIP NONHPOA (GAUZE/BANDAGES/DRESSINGS)
BENZOIN TINCTURE PRP APPL 2/3 (GAUZE/BANDAGES/DRESSINGS) IMPLANT
BLADE SURG 15 STRL LF DISP TIS (BLADE) ×1 IMPLANT
BLADE SURG 15 STRL SS (BLADE) ×1
BNDG CMPR 5X4 KNIT ELC UNQ LF (GAUZE/BANDAGES/DRESSINGS) ×1
BNDG CMPR 9X4 STRL LF SNTH (GAUZE/BANDAGES/DRESSINGS) ×1
BNDG ELASTIC 4INX 5YD STR LF (GAUZE/BANDAGES/DRESSINGS) ×1 IMPLANT
BNDG ESMARK 4X9 LF (GAUZE/BANDAGES/DRESSINGS) IMPLANT
CORD BIPOLAR FORCEPS 12FT (ELECTRODE) ×1 IMPLANT
COVER BACK TABLE 60X90IN (DRAPES) ×1 IMPLANT
COVER MAYO STAND STRL (DRAPES) ×1 IMPLANT
CUFF TOURN SGL QUICK 18X3 (MISCELLANEOUS) ×1 IMPLANT
DRAPE EXTREMITY T 121X128X90 (DISPOSABLE) ×1 IMPLANT
DRAPE INCISE IOBAN 66X45 STRL (DRAPES) IMPLANT
DURAPREP 26ML APPLICATOR (WOUND CARE) ×1 IMPLANT
GAUZE XEROFORM 1X8 LF (GAUZE/BANDAGES/DRESSINGS) ×1 IMPLANT
GLOVE BIOGEL PI IND STRL 7.0 (GLOVE) IMPLANT
GOWN STRL REUS W/ TWL LRG LVL3 (GOWN DISPOSABLE) ×1 IMPLANT
GOWN STRL REUS W/ TWL XL LVL3 (GOWN DISPOSABLE) IMPLANT
GOWN STRL REUS W/TWL LRG LVL3 (GOWN DISPOSABLE) ×2
GOWN STRL REUS W/TWL XL LVL3 (GOWN DISPOSABLE) ×2
LOOP VASCLR MAXI BLUE 18IN ST (MISCELLANEOUS) IMPLANT
LOOP VASCULAR MAXI 18 BLUE (MISCELLANEOUS)
LOOPS VASCLR MAXI BLUE 18IN ST (MISCELLANEOUS) IMPLANT
NDL HYPO 25X1 1.5 SAFETY (NEEDLE) IMPLANT
NEEDLE HYPO 25X1 1.5 SAFETY (NEEDLE) ×1 IMPLANT
NS IRRIG 1000ML POUR BTL (IV SOLUTION) ×1 IMPLANT
PACK BASIN DAY SURGERY FS (CUSTOM PROCEDURE TRAY) ×1 IMPLANT
PAD CAST 3X4 CTTN HI CHSV (CAST SUPPLIES) ×1 IMPLANT
PADDING CAST ABS COTTON 4X4 ST (CAST SUPPLIES) ×1 IMPLANT
PADDING CAST COTTON 3X4 STRL (CAST SUPPLIES) ×1
SPLINT PLASTER CAST XFAST 3X15 (CAST SUPPLIES) IMPLANT
STOCKINETTE 4X48 STRL (DRAPES) ×1 IMPLANT
STRIP CLOSURE SKIN 1/2X4 (GAUZE/BANDAGES/DRESSINGS) IMPLANT
SUCTION FRAZIER HANDLE 10FR (MISCELLANEOUS) ×1
SUCTION TUBE FRAZIER 10FR DISP (MISCELLANEOUS) IMPLANT
SUT ETHIBOND 3-0 V-5 (SUTURE) IMPLANT
SUT ETHILON 3 0 PS 1 (SUTURE) IMPLANT
SUT ETHILON 4 0 PS 2 18 (SUTURE) ×1 IMPLANT
SUT VIC AB 3-0 SH 27 (SUTURE) ×1
SUT VIC AB 3-0 SH 27X BRD (SUTURE) IMPLANT
SYR BULB EAR ULCER 3OZ GRN STR (SYRINGE) ×1 IMPLANT
SYR CONTROL 10ML LL (SYRINGE) IMPLANT
TOWEL GREEN STERILE FF (TOWEL DISPOSABLE) ×1 IMPLANT
TUBE CONNECTING 20X1/4 (TUBING) IMPLANT
UNDERPAD 30X36 HEAVY ABSORB (UNDERPADS AND DIAPERS) ×1 IMPLANT
VASCULAR TIE MAXI BLUE 18IN ST (MISCELLANEOUS)

## 2022-07-19 NOTE — Transfer of Care (Signed)
Immediate Anesthesia Transfer of Care Note  Patient: Micheal Ross.  Procedure(s) Performed: LEFT CUBITAL TUNNEL RELEASE (Left: Arm Upper)  Patient Location: PACU  Anesthesia Type:General  Level of Consciousness: awake, alert , and oriented  Airway & Oxygen Therapy: Patient Spontanous Breathing and Patient connected to face mask oxygen  Post-op Assessment: Report given to RN and Post -op Vital signs reviewed and stable  Post vital signs: Reviewed and stable  Last Vitals:  Vitals Value Taken Time  BP 127/90 07/19/22 1412  Temp    Pulse 97 07/19/22 1413  Resp 18 07/19/22 1413  SpO2 100 % 07/19/22 1413  Vitals shown include unvalidated device data.  Last Pain:  Vitals:   07/19/22 1120  TempSrc: Oral  PainSc: 0-No pain         Complications: No notable events documented.

## 2022-07-19 NOTE — Discharge Instructions (Addendum)
  Post Anesthesia Home Care Instructions  Activity: Get plenty of rest for the remainder of the day. A responsible individual must stay with you for 24 hours following the procedure.  For the next 24 hours, DO NOT: -Drive a car -Advertising copywriter -Drink alcoholic beverages -Take any medication unless instructed by your physician -Make any legal decisions or sign important papers.  Meals: Start with liquid foods such as gelatin or soup. Progress to regular foods as tolerated. Avoid greasy, spicy, heavy foods. If nausea and/or vomiting occur, drink only clear liquids until the nausea and/or vomiting subsides. Call your physician if vomiting continues.  Special Instructions/Symptoms: Your throat may feel dry or sore from the anesthesia or the breathing tube placed in your throat during surgery. If this causes discomfort, gargle with warm salt water. The discomfort should disappear within 24 hours.  If you had a scopolamine patch placed behind your ear for the management of post- operative nausea and/or vomiting:  1. The medication in the patch is effective for 72 hours, after which it should be removed.  Wrap patch in a tissue and discard in the trash. Wash hands thoroughly with soap and water. 2. You may remove the patch earlier than 72 hours if you experience unpleasant side effects which may include dry mouth, dizziness or visual disturbances. 3. Avoid touching the patch. Wash your hands with soap and water after contact with the patch.    Next dose of tylenol may be taken at 6p  You can remove the dressing after 4 days and take shower.  He can place a large Band-Aid over the sutures at the elbow and then rewrap the Ace wrap over the top for padding to the elbow.  Keeping your arm elevated and ice on your elbow over the incision for the next 2 days will significantly help the pain.  Taking 2 Aleve twice a day or ibuprofen 3 or 4 twice a day with food will also help.  See Dr. Ophelia Charter in about 1  week.

## 2022-07-19 NOTE — H&P (Signed)
 Office Visit Note   Patient: Micheal Greenfeld Jr.           Date of Birth: 12/05/1960           MRN: [id number] Visit Date: 06/29/2022              Requested by: Ahern, Antonia B, MD [address] [place],  [place] [address] PCP: Shah, Ashish, MD   Assessment & Plan: Visit Diagnoses:  1. Carpal tunnel syndrome of right wrist   2. 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  He also has moderate right carpal tunnel but not really bothering him currently.  He like proceed with left cubital tunnel release this to be done in outpatient local anesthesia with MAC.  He like to have this done at [facility].  Procedure discussed electrical test reviewed.  Follow-Up Instructions: No follow-ups on file.   Orders:  No orders of the defined types were placed in this encounter.  No orders of the defined types were placed in this encounter.     Procedures: No procedures performed   Clinical Data: No additional findings.   Subjective: Chief Complaint  Patient presents with   Right Hand - Numbness   Left Hand - Numbness    HPI 61-year-old male returns now almost 1 year post left carpal tunnel release.  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  He is only symptomatic on the left at this point.  Patient has had x-rays of his neck which showed multilevel spondylitic changes straightening of the cervical spine which was noted on a CT scan that was done for some prominence of supraclavicular tissue that turned out to be adipose without evidence of lymphadenopathy.  Patient states the left ulnar 1 and half fingers wakes him up every night multiple  times.  He drops objects since noticed lack of strength.  Review of Systems past history meningioma.  Previous left carpal tunnel release 08/15/2021 under MAC without problems.  He is on no treatment for hyperlipidemia hypertension.   Objective: Vital Signs: Ht 5' 10" (1.778 m)   Wt (!) 308 lb (139.7 kg)   BMI 44.19 kg/m   Physical Exam Constitutional:      Appearance: He is well-developed.  HENT:     Head: Normocephalic and atraumatic.     Right Ear: External ear normal.     Left Ear: External ear normal.  Eyes:     Pupils: Pupils are equal, round, and reactive to light.  Neck:     Thyroid: No thyromegaly.     Trachea: No tracheal deviation.  Cardiovascular:     Rate and Rhythm: Normal rate.  Pulmonary:     Effort: Pulmonary effort is normal.     Breath sounds: No wheezing.  Abdominal:     General: Bowel sounds are normal.     Palpations: Abdomen is soft.  Musculoskeletal:     Cervical back: Neck supple.  Skin:    General: Skin is warm and dry.     Capillary Refill: Capillary refill takes less than 2 seconds.  Neurological:     Mental Status: He is alert and oriented to person, place,   and time.  Psychiatric:        Behavior: Behavior normal.        Thought Content: Thought content normal.        Judgment: Judgment normal.     Ortho Exam positive Tinel's left elbow negative on the right no ulnar nerve subluxation.  Well-healed left carpal tunnel incision no pain with carpal compression negative Phalen's on the left.  Positive Phalen's on the right with mild thenar weakness on the right.  Negative Spurling right and left no brachial plexus tenderness right or left.  Specialty Comments:         Conclusion:  This is an abnormal study. 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  Abnormal nerve conductions in the left median nerves likely due to prior carpal tunnel syndrome status post decompression as he is asymptomatic for  left carpal tunnel syndrome. 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   No denervation in the proximal muscles or cervical paraspinal muscles to suggest cervical radiculopathy.      ------------------------------- Antonia Ahern, M.D.    [facility] [address], [address] [place], [place] [address] Tel: [phone] Fax: [phone]  Imaging: No results found.   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   Rectal bleeding 01/15/2019   Gastroesophageal reflux disease 01/15/2019   Shortness of breath 01/15/2019   H/O adenomatous polyp of colon 11/14/2018   Hypertension 03/29/2018   Hyperlipidemia 03/29/2018   Meningioma 09/20/2012   Past Medical History:  Diagnosis Date   Brain tumor    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)   Hypercholesterolemia    Hypertension    Neck pain    Shoulder pain    Sleep apnea    uses bipap at home    Family History  Problem Relation Age of Onset   Thyroid disease Mother    COPD Father    Cancer Maternal Aunt    Cancer Maternal Uncle        back   Cancer Paternal Uncle        throat    Cancer Cousin        prostate   Colon cancer Neg Hx    Colon polyps Neg Hx    Allergic rhinitis Neg Hx     Angioedema Neg Hx    Asthma Neg Hx    Eczema Neg Hx     Past Surgical History:  Procedure Laterality Date   ANKLE SURGERY     BIOPSY  04/21/2022   Procedure: BIOPSY;  Surgeon: Carver, Charles K, DO;  [facility];  Service: Endoscopy;;   brain tumor removal  2014   CARPAL TUNNEL RELEASE Left 08/15/2021   Procedure: LEFT CARPAL TUNNEL RELEASE;  Surgeon: Shahil Speegle C, MD;    Location: [facility];  Service: Orthopedics;  Laterality: Left;   COLONOSCOPY WITH PROPOFOL N/A 01/13/2019   Procedure: COLONOSCOPY WITH PROPOFOL;  Surgeon: Rourk, Robert M, MD;  20 mm polyp found in the cecum that was removed via piecemeal hot snare polypectomy. Repeat in 4 months. path tubulovillous adenoma, negative for high-grade dysplasia   COLONOSCOPY WITH PROPOFOL N/A 01/20/2019   Procedure: COLONOSCOPY WITH PROPOFOL;  Surgeon: Fields, Sandi L, MD; large defect (2 cm x 1.5 cm with small visible vessel with stigmata of recent bleed) in the cecum.  3 hemostatic clips placed (MR conditional).  One 3 mm polyp in the cecum, tortuous colon, external and internal hemorrhoids.  Pathology revealed tubular adenoma.   COLONOSCOPY WITH PROPOFOL N/A 10/06/2021   Procedure: COLONOSCOPY WITH PROPOFOL;  Surgeon: Carver, Charles K, DO;  [facility];  Service: Endoscopy;  Laterality: N/A;  7:30am   ESOPHAGOGASTRODUODENOSCOPY (EGD) WITH PROPOFOL N/A 04/21/2022   Procedure: ESOPHAGOGASTRODUODENOSCOPY (EGD) WITH PROPOFOL;  Surgeon: Carver, Charles K, DO;  [facility];  Service: Endoscopy;  Laterality: N/A;  145pm, asa 3, pt can't come earlier - transportation   HERNIA REPAIR  2010   umbilical   NASAL SEPTOPLASTY W/ TURBINOPLASTY Bilateral 11/30/2020   Procedure: NASAL SEPTOPLASTY WITH BILATERAL  TURBINATE REDUCTION;  Surgeon: Teoh, Su, MD;  Location: [facility];  Service: ENT;  Laterality: Bilateral;   POLYPECTOMY  01/13/2019   Procedure: POLYPECTOMY;  Surgeon: Rourk, Robert  M, MD;  [facility];  Service: Endoscopy;;  Large Cecal Polyp    POLYPECTOMY  10/06/2021   Procedure: POLYPECTOMY;  Surgeon: Carver, Charles K, DO;  [facility];  Service: Endoscopy;;   SINOSCOPY     Social History   Occupational History   Not on file  Tobacco Use   Smoking status: Former    Packs/day: 0.50    Years: 2.00    Additional pack years: 0.00    Total pack years: 1.00    Types: Cigarettes    Quit date: 03/30/1983    Years since quitting: 39.2    Passive exposure: Past   Smokeless tobacco: Never  Vaping Use   Vaping Use: Never used  Substance and Sexual Activity   Alcohol use: Not Currently    Comment: occ   Drug use: No   Sexual activity: Yes    Comment: married for 1985, 6 grown kids        

## 2022-07-19 NOTE — Interval H&P Note (Signed)
History and Physical Interval Note:  07/19/2022 12:56 PM  Coty Guerry Minors.  has presented today for surgery, with the diagnosis of left cubital tunnel syndrome.  The various methods of treatment have been discussed with the patient and family. After consideration of risks, benefits and other options for treatment, the patient has consented to  Procedure(s): LEFT CUBITAL TUNNEL RELEASE (Left) as a surgical intervention.  The patient's history has been reviewed, patient examined, no change in status, stable for surgery.  I have reviewed the patient's chart and labs.  Questions were answered to the patient's satisfaction.     Eldred Manges

## 2022-07-19 NOTE — Anesthesia Preprocedure Evaluation (Addendum)
Anesthesia Evaluation  Patient identified by MRN, date of birth, ID band Patient awake    Reviewed: Allergy & Precautions, NPO status , Patient's Chart, lab work & pertinent test results  History of Anesthesia Complications Negative for: history of anesthetic complications  Airway Mallampati: II  TM Distance: >3 FB Neck ROM: Full    Dental  (+) Partial Upper,    Pulmonary sleep apnea (BiPAP) , former smoker   Pulmonary exam normal        Cardiovascular hypertension, Pt. on medications Normal cardiovascular exam     Neuro/Psych    GI/Hepatic Neg liver ROS,GERD  ,,  Endo/Other    Morbid obesity  Renal/GU negative Renal ROS     Musculoskeletal   Abdominal   Peds  Hematology negative hematology ROS (+)   Anesthesia Other Findings left cubital tunnel syndrome  Reproductive/Obstetrics                             Anesthesia Physical Anesthesia Plan  ASA: 3  Anesthesia Plan: General   Post-op Pain Management: Tylenol PO (pre-op)*   Induction: Intravenous  PONV Risk Score and Plan: 2 and Treatment may vary due to age or medical condition, Midazolam, Ondansetron and Dexamethasone  Airway Management Planned: LMA  Additional Equipment: None  Intra-op Plan:   Post-operative Plan: Extubation in OR  Informed Consent: I have reviewed the patients History and Physical, chart, labs and discussed the procedure including the risks, benefits and alternatives for the proposed anesthesia with the patient or authorized representative who has indicated his/her understanding and acceptance.     Dental advisory given  Plan Discussed with: CRNA  Anesthesia Plan Comments:        Anesthesia Quick Evaluation

## 2022-07-19 NOTE — Telephone Encounter (Signed)
BP's look fine, ok to adjust meds temporarily as needed for his surgery  Dominga Ferry MD

## 2022-07-19 NOTE — Anesthesia Postprocedure Evaluation (Signed)
Anesthesia Post Note  Patient: Cylus Carpenito.  Procedure(s) Performed: LEFT CUBITAL TUNNEL RELEASE (Left: Arm Upper)     Patient location during evaluation: PACU Anesthesia Type: General Level of consciousness: awake and alert Pain management: pain level controlled Vital Signs Assessment: post-procedure vital signs reviewed and stable Respiratory status: spontaneous breathing, nonlabored ventilation and respiratory function stable Cardiovascular status: blood pressure returned to baseline Postop Assessment: no apparent nausea or vomiting Anesthetic complications: no   No notable events documented.  Last Vitals:  Vitals:   07/19/22 1430 07/19/22 1445  BP: 111/69 111/71  Pulse: 76 76  Resp: 10 15  Temp:    SpO2: 99%     Last Pain:  Vitals:   07/19/22 1430  TempSrc:   PainSc: 4                  Shanda Howells

## 2022-07-19 NOTE — Op Note (Signed)
Pre and postop diagnosis left cubital tunnel  Procedure: Left ulnar nerve decompression at the cubital tunnel left elbow.  Surgeon: Annell Greening, MD  Anesthesia: General orotracheal plus Marcaine skin local with lidocaine 1-1.  Tourniquet less than 20 minutes x 250.  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  Procedure after induction of anesthesia proximal arm tourniquet timeout procedure Ancef prophylaxis arm was prepped with DuraPrep from the fingertips to the tourniquet extremity sheets and drapes were applied.  Sterile skin marker was used for small C-shaped incision overlying the cubital tunnel.  Arm is wrapped in Esmarch tourniquet inflated.  Skin was incised subcutaneous tissue meticulously bipolar cautery is.  Thick layer of some adipose tissue and surprisingly thick muscle overlying the cubital tunnel.  Palpation of the medial upper condyle and tip of olecranon identified the groove.  The nerve could be palpated underneath the muscle but then conus muscle was hypertrophic thickened and 1.5 cm to 2 cm thick.  Patient had a large arm thick subcutaneous tissue and lifted weights for many years.  Muscle was divided using the bipolar cautery once it was divided fascia and dilated veins overlying the ulnar nerve were coagulated unroofing it.  Fingertip was followed distally releasing a portion of the top fascia of the FCU and underlying fascia so fingertip could be placed underneath and no compression of the ulnar nerve.  Similarly proximal neuro nares compression.  Irrigation tourniquet deflation.  1 small of vein was recoagulated.  Subcutaneous tissue was dry anconeus muscle had no bleeding since it been divided using the bipolar cautery with careful protection of the ulnar nerve.  Repeat irrigation reapproximation of the subtendinous tissue with 3-0 Vicryl.  3-0 nylon interrupted simple sutures Marcaine  infiltration with one-to-one Marcaine to Xylocaine.  Less than 10 cc was used.  Xeroform 4 x 4's web roll and Ace wrap was applied for postop dressing patient tolerated things well transferred recovery room in stable condition.

## 2022-07-19 NOTE — Anesthesia Procedure Notes (Signed)
Procedure Name: LMA Insertion Date/Time: 07/19/2022 1:15 PM  Performed by: Burna Cash, CRNAPre-anesthesia Checklist: Patient identified, Emergency Drugs available, Suction available and Patient being monitored Patient Re-evaluated:Patient Re-evaluated prior to induction Oxygen Delivery Method: Circle system utilized Preoxygenation: Pre-oxygenation with 100% oxygen Induction Type: IV induction Ventilation: Mask ventilation without difficulty LMA: LMA inserted LMA Size: 5.0 Number of attempts: 1 Airway Equipment and Method: Bite block Placement Confirmation: positive ETCO2 Tube secured with: Tape Dental Injury: Teeth and Oropharynx as per pre-operative assessment

## 2022-07-20 ENCOUNTER — Encounter (HOSPITAL_BASED_OUTPATIENT_CLINIC_OR_DEPARTMENT_OTHER): Payer: Self-pay | Admitting: Orthopaedic Surgery

## 2022-07-20 ENCOUNTER — Telehealth: Payer: Self-pay | Admitting: Radiology

## 2022-07-20 NOTE — Telephone Encounter (Signed)
Op note printed and both codes written on report. Patient to have daughter pick up in the [place] office.

## 2022-07-20 NOTE — Telephone Encounter (Signed)
[place], Carlon L  [place], [place], RT  Pt called wanting a detailed report of his surgery with codes. Hoping you canf help with this. Call him with any questions  [phone]  Thanks!   Sorry!

## 2022-07-20 NOTE — Telephone Encounter (Signed)
Please see message from [place] office below. Can you help? I can print operative note for him, but do not have any codes that he may be requesting.  I will be glad to do whatever is needed. Thanks.

## 2022-07-21 ENCOUNTER — Telehealth: Payer: Self-pay | Admitting: Radiology

## 2022-07-21 NOTE — Telephone Encounter (Signed)
I spoke with patient. He is not having any pain and has only taken one oxycodone. He wanted to know how long he needed to take the ibuprofen, and I advised, if he does not need it, he can stop it.  He expressed understanding.

## 2022-07-21 NOTE — Telephone Encounter (Signed)
Patient left voicemail stating Dr. Ophelia Charter told him to take ibuprofen for a few days post op. He would like to know if he should take this until he comes back for his post op appointment?   Please advise.  CB [phone]

## 2022-07-27 ENCOUNTER — Encounter: Payer: Self-pay | Admitting: Orthopaedic Surgery

## 2022-07-27 ENCOUNTER — Ambulatory Visit (INDEPENDENT_AMBULATORY_CARE_PROVIDER_SITE_OTHER): Payer: 59 | Admitting: Orthopaedic Surgery

## 2022-07-27 VITALS — Ht 70.0 in | Wt 315.0 lb

## 2022-07-27 DIAGNOSIS — G5622 Lesion of ulnar nerve, left upper limb: Secondary | ICD-10-CM

## 2022-07-27 DIAGNOSIS — G5601 Carpal tunnel syndrome, right upper limb: Secondary | ICD-10-CM

## 2022-07-27 NOTE — Progress Notes (Signed)
Post-Op Visit Note   Patient: Micheal Ross.           Date of Birth: 10-21-1960           MRN: [id number] Visit Date: 07/27/2022 PCP: Kirstie Peri, MD   Assessment & Plan: post left  cubital tunnel release good relief of symptoms occasionally noticeable twinge.  Incision looks good.  Chief Complaint:  Chief Complaint  Patient presents with   Left Elbow - Routine Post Op    07/19/2022 left cubital tunnel release   Visit Diagnoses:  1. Cubital tunnel syndrome on left   2. Carpal tunnel syndrome of right wrist     Plan: Return in 1 week for suture removal left elbow.  Follow-Up Instructions: Return in about 1 week (around 08/03/2022).   Orders:  No orders of the defined types were placed in this encounter.  No orders of the defined types were placed in this encounter.   Imaging: No results found.  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   Rectal bleeding 01/15/2019   Gastroesophageal reflux disease 01/15/2019   Shortness of breath 01/15/2019   H/O adenomatous polyp of colon 11/14/2018   Hypertension 03/29/2018   Hyperlipidemia 03/29/2018   Meningioma (HCC) 09/20/2012   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)   Dizziness    Hypercholesterolemia    Hypertension    Neck pain    Shoulder pain    Sleep apnea    uses bipap at home    Family History  Problem Relation Age of Onset   Thyroid disease Mother    COPD Father     Cancer Maternal Aunt    Cancer Maternal Uncle        back   Cancer Paternal Uncle        throat    Cancer Cousin        prostate   Colon cancer Neg Hx    Colon polyps Neg Hx    Allergic rhinitis Neg Hx    Angioedema Neg Hx    Asthma Neg Hx    Eczema Neg Hx     Past Surgical History:  Procedure Laterality Date   ANKLE SURGERY     BIOPSY  04/21/2022   Procedure: BIOPSY;  Surgeon: Lanelle Bal, DO;  [facility];  Service: Endoscopy;;   brain tumor removal  2014   CARPAL TUNNEL RELEASE Left 08/15/2021   Procedure: LEFT CARPAL TUNNEL RELEASE;  Surgeon: Eldred Manges, MD;  Location: [facility];  Service: Orthopedics;  Laterality: Left;   COLONOSCOPY WITH PROPOFOL N/A 01/13/2019   Procedure: COLONOSCOPY WITH PROPOFOL;  Surgeon: Corbin Ade, MD;  20 mm polyp found in the cecum that was removed via piecemeal hot snare polypectomy. Repeat in 4 months. path tubulovillous adenoma, negative for high-grade dysplasia   COLONOSCOPY WITH PROPOFOL N/A 01/20/2019   Procedure: COLONOSCOPY WITH PROPOFOL;  Surgeon: West Bali, MD; large defect (2 cm x 1.5 cm with small visible vessel with stigmata of recent bleed) in the cecum.  3 hemostatic clips placed (MR conditional).  One 3 mm polyp in the cecum, tortuous colon, external and internal hemorrhoids.  Pathology revealed tubular adenoma.   COLONOSCOPY WITH PROPOFOL N/A 10/06/2021   Procedure: COLONOSCOPY WITH PROPOFOL;  Surgeon: Lanelle Bal, DO;  [facility];  Service: Endoscopy;  Laterality: N/A;  7:30am   ESOPHAGOGASTRODUODENOSCOPY (EGD) WITH PROPOFOL N/A 04/21/2022   Procedure: ESOPHAGOGASTRODUODENOSCOPY (EGD) WITH PROPOFOL;  Surgeon: Lanelle Bal, DO;  [facility];  Service: Endoscopy;  Laterality: N/A;  145pm, asa 3, pt can't come earlier - transportation   HERNIA REPAIR  2010   umbilical   NASAL SEPTOPLASTY W/ TURBINOPLASTY Bilateral 11/30/2020   Procedure: NASAL SEPTOPLASTY  WITH BILATERAL  TURBINATE REDUCTION;  Surgeon: Newman Pies, MD;  Location: [facility];  Service: ENT;  Laterality: Bilateral;   POLYPECTOMY  01/13/2019   Procedure: POLYPECTOMY;  Surgeon: Corbin Ade, MD;  [facility];  Service: Endoscopy;;  Large Cecal Polyp    POLYPECTOMY  10/06/2021   Procedure: POLYPECTOMY;  Surgeon: Lanelle Bal, DO;  [facility];  Service: Endoscopy;;   SINOSCOPY     ULNAR TUNNEL RELEASE Left 07/19/2022   Procedure: LEFT CUBITAL TUNNEL RELEASE;  Surgeon: Eldred Manges, MD;  Location: [facility];  Service: Orthopedics;  Laterality: Left;   Social History   Occupational History   Not on file  Tobacco Use   Smoking status: Former    Packs/day: 0.50    Years: 2.00    Additional pack years: 0.00    Total pack years: 1.00    Types: Cigarettes    Quit date: 03/30/1983    Years since quitting: 39.3    Passive exposure: Past   Smokeless tobacco: Never  Vaping Use   Vaping Use: Never used  Substance and Sexual Activity   Alcohol use: Not Currently    Comment: occ   Drug use: No   Sexual activity: Yes    Comment: married for 1985, 6 grown kids

## 2022-08-01 ENCOUNTER — Telehealth: Payer: Self-pay

## 2022-08-01 ENCOUNTER — Other Ambulatory Visit: Payer: Self-pay

## 2022-08-01 DIAGNOSIS — G4733 Obstructive sleep apnea (adult) (pediatric): Secondary | ICD-10-CM | POA: Diagnosis not present

## 2022-08-01 MED ORDER — PREGABALIN 150 MG PO CAPS: 150.0000 mg | ORAL_CAPSULE | Freq: Two times a day (BID) | ORAL | 5 refills | Status: DC

## 2022-08-01 NOTE — Telephone Encounter (Signed)
Please schedule pt for appt to see Dr Marletta Lor (pt specified him) for his stomach issues. thanks

## 2022-08-01 NOTE — Telephone Encounter (Signed)
BP and HRs look fine, nothing about them would cause any symptoms. Agree with discussing symptoms with neurology  Dominga Ferry MD

## 2022-08-01 NOTE — Telephone Encounter (Signed)
Requested Prescriptions   Pending Prescriptions Disp Refills   pregabalin (LYRICA) 150 MG capsule 120 capsule 0    Sig: Take 1 capsule (150 mg total) by mouth 2 (two) times daily.   Last seen 04/24/22, next appt scheduled for 10/25/22 Dispenses   Dispensed Days Supply Quantity Provider Pharmacy  PREGABALIN 150 MG CAPSULE 06/27/2022 30 60 each Jorge Mandril, NP [facility] [phone] - E...  PREGABALIN 150 MG CAPSULE 05/30/2022 30 60 each Kirstie Peri, MD [facility] [phone] - E...  PREGABALIN 150 MG CAPSULE 05/02/2022 30 60 each Kirstie Peri, MD [facility] [phone] - E...  PREGABALIN 150MG  CAP 01/31/2022 90 180 each Jorge Mandril, NP [facility] [phone] ...  PREGABALIN 300MG     CAP 01/12/2022 30 60 each Windell Norfolk, MD [facility] [phone] ...  PREGABALIN 150MG  CAP 10/27/2021 90 180 each Windell Norfolk, MD [facility] [phone] ...       Routing to provider to review for refill. Please be aware its been sent by multiple providers

## 2022-08-01 NOTE — Telephone Encounter (Signed)
Please add the patient on the waiting list to been seen for dizziness

## 2022-08-02 ENCOUNTER — Encounter (HOSPITAL_COMMUNITY): Payer: 59

## 2022-08-03 ENCOUNTER — Other Ambulatory Visit: Payer: Self-pay | Admitting: *Deleted

## 2022-08-03 ENCOUNTER — Encounter: Payer: Self-pay | Admitting: Orthopaedic Surgery

## 2022-08-03 ENCOUNTER — Ambulatory Visit (INDEPENDENT_AMBULATORY_CARE_PROVIDER_SITE_OTHER): Payer: 59 | Admitting: Orthopaedic Surgery

## 2022-08-03 VITALS — Ht 70.0 in | Wt 315.0 lb

## 2022-08-03 DIAGNOSIS — G5601 Carpal tunnel syndrome, right upper limb: Secondary | ICD-10-CM

## 2022-08-03 DIAGNOSIS — G5622 Lesion of ulnar nerve, left upper limb: Secondary | ICD-10-CM

## 2022-08-03 MED ORDER — LISINOPRIL 20 MG PO TABS: 20.0000 mg | ORAL_TABLET | Freq: Every day | ORAL | 1 refills | Status: DC

## 2022-08-03 NOTE — Telephone Encounter (Signed)
noted 

## 2022-08-03 NOTE — Progress Notes (Signed)
Post-Op Visit Note   Patient: Micheal Ross.           Date of Birth: 03-27-1960           MRN: [id number] Visit Date: 08/03/2022 PCP: Kirstie Peri, MD   Assessment & Plan: Follow-up left cubital tunnel release.  States sensation of the left hand is much better sutures are removed Steri-Strips applied left elbow.  Last year he had carpal tunnel release on the left and states left hand is doing well.  Electrical test done 4//24 showed moderate right carpal tunnel syndrome.  He states he would like to have the right carpal tunnel release done in a few weeks.  He has failed splinting.  Patient is disabled but states hand wakes him up at night has to shake his hand bothers him with driving and he drops objects.  Chief Complaint:  Chief Complaint  Patient presents with   Left Elbow - Routine Post Op, Follow-up    07/19/2022 left cubital tunnel release   Visit Diagnoses:  1. Carpal tunnel syndrome of right wrist   2. Ulnar neuropathy at elbow of left upper extremity     Plan: Patient doing well post left cubital tunnel release.  We can schedule right carpal tunnel release in 3 to 4 weeks.  Outpatient procedure.  We discussed the carpal tunnel release he is familiar with this since he had the left hand done last year and left hand is doing well.  Follow-Up Instructions: No follow-ups on file.   Orders:  No orders of the defined types were placed in this encounter.  No orders of the defined types were placed in this encounter.   Imaging: No results found.  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   Rectal bleeding 01/15/2019   Gastroesophageal reflux disease 01/15/2019   Shortness of breath 01/15/2019   H/O adenomatous polyp of colon 11/14/2018   Hypertension 03/29/2018   Hyperlipidemia 03/29/2018   Meningioma (HCC) 09/20/2012   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)   Dizziness    Hypercholesterolemia    Hypertension    Neck pain    Shoulder pain    Sleep apnea    uses bipap at home    Family History  Problem Relation Age of Onset   Thyroid disease Mother    COPD Father    Cancer Maternal Aunt    Cancer Maternal Uncle        back   Cancer Paternal Uncle        throat    Cancer Cousin        prostate   Colon cancer Neg Hx    Colon polyps Neg Hx    Allergic rhinitis Neg Hx    Angioedema Neg Hx    Asthma Neg Hx    Eczema Neg Hx     Past Surgical History:  Procedure Laterality Date  ANKLE SURGERY     BIOPSY  04/21/2022   Procedure: BIOPSY;  Surgeon: Lanelle Bal, DO;  [facility];  Service: Endoscopy;;   brain tumor removal  2014   CARPAL TUNNEL RELEASE Left 08/15/2021   Procedure: LEFT CARPAL TUNNEL RELEASE;  Surgeon: Eldred Manges, MD;  Location: [facility];  Service: Orthopedics;  Laterality: Left;   COLONOSCOPY WITH PROPOFOL N/A 01/13/2019   Procedure: COLONOSCOPY WITH PROPOFOL;  Surgeon: Corbin Ade, MD;  20 mm polyp found in the cecum that was removed via piecemeal hot snare polypectomy. Repeat in 4 months. path tubulovillous adenoma, negative for high-grade dysplasia   COLONOSCOPY WITH PROPOFOL N/A 01/20/2019   Procedure: COLONOSCOPY WITH PROPOFOL;  Surgeon: West Bali, MD; large defect (2 cm x 1.5 cm with small visible vessel with stigmata of recent bleed) in the cecum.  3 hemostatic clips placed (MR conditional).  One 3 mm polyp in the cecum, tortuous colon,  external and internal hemorrhoids.  Pathology revealed tubular adenoma.   COLONOSCOPY WITH PROPOFOL N/A 10/06/2021   Procedure: COLONOSCOPY WITH PROPOFOL;  Surgeon: Lanelle Bal, DO;  [facility];  Service: Endoscopy;  Laterality: N/A;  7:30am   ESOPHAGOGASTRODUODENOSCOPY (EGD) WITH PROPOFOL N/A 04/21/2022   Procedure: ESOPHAGOGASTRODUODENOSCOPY (EGD) WITH PROPOFOL;  Surgeon: Lanelle Bal, DO;  [facility];  Service: Endoscopy;  Laterality: N/A;  145pm, asa 3, pt can't come earlier - transportation   HERNIA REPAIR  2010   umbilical   NASAL SEPTOPLASTY W/ TURBINOPLASTY Bilateral 11/30/2020   Procedure: NASAL SEPTOPLASTY WITH BILATERAL  TURBINATE REDUCTION;  Surgeon: Newman Pies, MD;  Location: [facility];  Service: ENT;  Laterality: Bilateral;   POLYPECTOMY  01/13/2019   Procedure: POLYPECTOMY;  Surgeon: Corbin Ade, MD;  [facility];  Service: Endoscopy;;  Large Cecal Polyp    POLYPECTOMY  10/06/2021   Procedure: POLYPECTOMY;  Surgeon: Lanelle Bal, DO;  [facility];  Service: Endoscopy;;   SINOSCOPY     ULNAR TUNNEL RELEASE Left 07/19/2022   Procedure: LEFT CUBITAL TUNNEL RELEASE;  Surgeon: Eldred Manges, MD;  Location: [facility];  Service: Orthopedics;  Laterality: Left;   Social History   Occupational History   Not on file  Tobacco Use   Smoking status: Former    Packs/day: 0.50    Years: 2.00    Additional pack years: 0.00    Total pack years: 1.00    Types: Cigarettes    Quit date: 03/30/1983    Years since quitting: 39.3    Passive exposure: Past   Smokeless tobacco: Never  Vaping Use   Vaping Use: Never used  Substance and Sexual Activity   Alcohol use: Not Currently    Comment: occ   Drug use: No   Sexual activity: Yes    Comment: married for 1985, 6 grown kids

## 2022-08-08 NOTE — Progress Notes (Unsigned)
Referring Provider: Kirstie Peri, MD Primary Care Physician:  Kirstie Peri, MD Primary GI Physician: Dr. Marletta Lor  No chief complaint on file.   HPI:   Micheal Ross. is a 62 y.o. male with history of tubulovillous adenoma in 2020 complicated by post polypectomy bleed, last colonoscopy 2023 with multiple small tubular adenomas, on recall in 2028, history of constipation, H. pylori gastric ulcer February 2024 treated with quadruple therapy with tetracycline, metronidazole, bismuth, pantoprazole twice daily, with confirmed eradication, presenting today with chief complaint of ***  Last seen in our office 07/05/2022.  He reported abdominal pain vastly improved, stop pantoprazole 2 weeks ago.  No other significant GI symptoms.  Constipation well-controlled on MiraLAX as needed.  Recommended completing H. pylori breath test.  Okay to remain off PPI therapy for now, but may need to reinstitute if symptoms return.  Recommended 58-month follow-up.  H. pylori breath test was negative.  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)   Dizziness    Hypercholesterolemia    Hypertension    Neck pain    Shoulder pain    Sleep apnea    uses bipap at home    Past Surgical History:  Procedure Laterality Date   ANKLE SURGERY     BIOPSY  04/21/2022   Procedure: BIOPSY;  Surgeon: Lanelle Bal, DO;  [facility];  Service: Endoscopy;;   brain tumor removal  2014   CARPAL TUNNEL RELEASE Left 08/15/2021   Procedure: LEFT CARPAL TUNNEL RELEASE;  Surgeon: Eldred Manges, MD;  Location: [facility];  Service: Orthopedics;  Laterality: Left;   COLONOSCOPY WITH PROPOFOL N/A 01/13/2019   Procedure: COLONOSCOPY WITH PROPOFOL;  Surgeon: Corbin Ade, MD;  20 mm polyp found in the cecum that was removed via piecemeal hot snare polypectomy. Repeat in 4 months. path tubulovillous adenoma, negative for  high-grade dysplasia   COLONOSCOPY WITH PROPOFOL N/A 01/20/2019   Procedure: COLONOSCOPY WITH PROPOFOL;  Surgeon: West Bali, MD; large defect (2 cm x 1.5 cm with small visible vessel with stigmata of recent bleed) in the cecum.  3 hemostatic clips placed (MR conditional).  One 3 mm polyp in the cecum, tortuous colon, external and internal hemorrhoids.  Pathology revealed tubular adenoma.   COLONOSCOPY WITH PROPOFOL N/A 10/06/2021   Procedure: COLONOSCOPY WITH PROPOFOL;  Surgeon: Lanelle Bal, DO;  [facility];  Service: Endoscopy;  Laterality: N/A;  7:30am   ESOPHAGOGASTRODUODENOSCOPY (EGD) WITH PROPOFOL N/A 04/21/2022   Procedure: ESOPHAGOGASTRODUODENOSCOPY (EGD) WITH PROPOFOL;  Surgeon: Lanelle Bal, DO;  [facility];  Service: Endoscopy;  Laterality: N/A;  145pm, asa 3, pt can't come earlier - transportation   HERNIA REPAIR  2010   umbilical   NASAL SEPTOPLASTY W/ TURBINOPLASTY Bilateral 11/30/2020   Procedure: NASAL SEPTOPLASTY WITH BILATERAL  TURBINATE REDUCTION;  Surgeon: Newman Pies, MD;  Location: [facility];  Service: ENT;  Laterality: Bilateral;   POLYPECTOMY  01/13/2019   Procedure: POLYPECTOMY;  Surgeon: Corbin Ade, MD;  [facility];  Service: Endoscopy;;  Large Cecal Polyp    POLYPECTOMY  10/06/2021   Procedure: POLYPECTOMY;  Surgeon: Lanelle Bal, DO;  [facility];  Service: Endoscopy;;   SINOSCOPY     ULNAR TUNNEL RELEASE Left 07/19/2022   Procedure: LEFT CUBITAL TUNNEL RELEASE;  Surgeon: Ophelia Charter,  Veverly Fells, MD;  Location: [facility];  Service: Orthopedics;  Laterality: Left;    Current Outpatient Medications  Medication Sig Dispense Refill   amLODipine (NORVASC) 10 MG tablet Take 1 tablet (10 mg total) by mouth daily. 90 tablet 1   Azelastine HCl (ASTEPRO) 0.15 % SOLN Place 1 spray into the nose daily in the afternoon.     chlorthalidone (HYGROTON) 25 MG tablet Take 0.5 tablets (12.5  mg total) by mouth every other day. 45 tablet 1   fluticasone (FLONASE) 50 MCG/ACT nasal spray SPRAY 2 SPRAYS INTO EACH NOSTRIL EVERY DAY 16 mL 5   lisinopril (ZESTRIL) 20 MG tablet Take 1 tablet (20 mg total) by mouth daily. 90 tablet 1   Olopatadine HCl 0.2 % SOLN Apply 1 drop to eye daily as needed. 2.5 mL 5   oxyCODONE-acetaminophen (PERCOCET) 5-325 MG tablet Take 1-2 tablets by mouth every 6 (six) hours as needed for severe pain. (Patient not taking: Reported on 07/27/2022) 30 tablet 0   pregabalin (LYRICA) 150 MG capsule Take 1 capsule (150 mg total) by mouth 2 (two) times daily. 120 capsule 5   rosuvastatin (CRESTOR) 5 MG tablet Take 5 mg by mouth at bedtime.     No current facility-administered medications for this visit.    Allergies as of 08/10/2022 - Review Complete 08/03/2022  Allergen Reaction Noted   Methocarbamol Other (See Comments) 10/12/2021    Family History  Problem Relation Age of Onset   Thyroid disease Mother    COPD Father    Cancer Maternal Aunt    Cancer Maternal Uncle        back   Cancer Paternal Uncle        throat    Cancer Cousin        prostate   Colon cancer Neg Hx    Colon polyps Neg Hx    Allergic rhinitis Neg Hx    Angioedema Neg Hx    Asthma Neg Hx    Eczema Neg Hx     Social History   Socioeconomic History   Marital status: Married    Spouse name: Not on file   Number of children: Not on file   Years of education: Not on file   Highest education level: Not on file  Occupational History   Not on file  Tobacco Use   Smoking status: Former    Packs/day: 0.50    Years: 2.00    Additional pack years: 0.00    Total pack years: 1.00    Types: Cigarettes    Quit date: 03/30/1983    Years since quitting: 39.3    Passive exposure: Past   Smokeless tobacco: Never  Vaping Use   Vaping Use: Never used  Substance and Sexual Activity   Alcohol use: Not Currently    Comment: occ   Drug use: No   Sexual activity: Yes    Comment:  married for 1985, 6 grown kids  Other Topics Concern   Not on file  Social History Narrative   Not on file   Social Determinants of Health   Financial Resource Strain: Not on file  Food Insecurity: Not on file  Transportation Needs: Not on file  Physical Activity: Not on file  Stress: Not on file  Social Connections: Not on file    Review of Systems: Gen: Denies fever, chills, anorexia. Denies fatigue, weakness, weight loss.  CV: Denies chest pain, palpitations, syncope, peripheral edema, and claudication. Resp: Denies dyspnea  at rest, cough, wheezing, coughing up blood, and pleurisy. GI: Denies vomiting blood, jaundice, and fecal incontinence.   Denies dysphagia or odynophagia. Derm: Denies rash, itching, dry skin Psych: Denies depression, anxiety, memory loss, confusion. No homicidal or suicidal ideation.  Heme: Denies bruising, bleeding, and enlarged lymph nodes.  Physical Exam: There were no vitals taken for this visit. General:   Alert and oriented. No distress noted. Pleasant and cooperative.  Head:  Normocephalic and atraumatic. Eyes:  Conjuctiva clear without scleral icterus. Heart:  S1, S2 present without murmurs appreciated. Lungs:  Clear to auscultation bilaterally. No wheezes, rales, or rhonchi. No distress.  Abdomen:  +BS, soft, non-tender and non-distended. No rebound or guarding. No HSM or masses noted. Msk:  Symmetrical without gross deformities. Normal posture. Extremities:  Without edema. Neurologic:  Alert and  oriented x4 Psych:  Normal mood and affect.    Assessment:     Plan:  ***   Ermalinda Memos, PA-C [facility] 08/10/2022

## 2022-08-09 ENCOUNTER — Ambulatory Visit (HOSPITAL_COMMUNITY): Payer: 59 | Attending: Neurology

## 2022-08-09 DIAGNOSIS — R293 Abnormal posture: Secondary | ICD-10-CM | POA: Insufficient documentation

## 2022-08-09 DIAGNOSIS — G8929 Other chronic pain: Secondary | ICD-10-CM | POA: Diagnosis not present

## 2022-08-09 DIAGNOSIS — M25511 Pain in right shoulder: Secondary | ICD-10-CM | POA: Insufficient documentation

## 2022-08-09 DIAGNOSIS — M542 Cervicalgia: Secondary | ICD-10-CM | POA: Insufficient documentation

## 2022-08-09 DIAGNOSIS — R29898 Other symptoms and signs involving the musculoskeletal system: Secondary | ICD-10-CM | POA: Diagnosis not present

## 2022-08-09 DIAGNOSIS — M25512 Pain in left shoulder: Secondary | ICD-10-CM | POA: Diagnosis not present

## 2022-08-09 NOTE — Therapy (Signed)
OUTPATIENT PHYSICAL THERAPY CERVICAL TREATMENT   Patient Name: Micheal Ross. 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)   Dizziness    Hypercholesterolemia    Hypertension    Neck pain    Shoulder pain    Sleep apnea    uses bipap at home   Past Surgical History:  Procedure Laterality Date   ANKLE SURGERY     BIOPSY  04/21/2022   Procedure: BIOPSY;  Surgeon: Lanelle Bal, DO;  [facility];  Service: Endoscopy;;   brain tumor removal  2014   CARPAL TUNNEL RELEASE Left 08/15/2021   Procedure: LEFT CARPAL TUNNEL RELEASE;  Surgeon: Eldred Manges, MD;  Location: [facility];  Service: Orthopedics;  Laterality: Left;   COLONOSCOPY WITH PROPOFOL N/A 01/13/2019   Procedure: COLONOSCOPY WITH PROPOFOL;  Surgeon: Corbin Ade, MD;  20 mm polyp found in the cecum that was removed via piecemeal hot snare polypectomy. Repeat in 4 months. path tubulovillous adenoma, negative for high-grade dysplasia   COLONOSCOPY WITH PROPOFOL N/A 01/20/2019   Procedure: COLONOSCOPY WITH PROPOFOL;  Surgeon: West Bali, MD; large defect (2 cm x 1.5 cm with small visible vessel with stigmata of recent bleed) in the cecum.  3 hemostatic clips placed (MR conditional).  One 3 mm polyp in the cecum, tortuous colon, external and internal hemorrhoids.  Pathology revealed tubular adenoma.    COLONOSCOPY WITH PROPOFOL N/A 10/06/2021   Procedure: COLONOSCOPY WITH PROPOFOL;  Surgeon: Lanelle Bal, DO;  [facility];  Service: Endoscopy;  Laterality: N/A;  7:30am   ESOPHAGOGASTRODUODENOSCOPY (EGD) WITH PROPOFOL N/A 04/21/2022   Procedure: ESOPHAGOGASTRODUODENOSCOPY (EGD) WITH PROPOFOL;  Surgeon: Lanelle Bal, DO;  [facility];  Service: Endoscopy;  Laterality: N/A;  145pm, asa 3, pt can't come earlier - transportation   HERNIA REPAIR  2010   umbilical   NASAL SEPTOPLASTY W/ TURBINOPLASTY Bilateral 11/30/2020   Procedure: NASAL SEPTOPLASTY WITH BILATERAL  TURBINATE REDUCTION;  Surgeon: Newman Pies, MD;  Location: [facility];  Service: ENT;  Laterality: Bilateral;   POLYPECTOMY  01/13/2019   Procedure: POLYPECTOMY;  Surgeon: Corbin Ade, MD;  [facility];  Service: Endoscopy;;  Large Cecal Polyp    POLYPECTOMY  10/06/2021   Procedure: POLYPECTOMY;  Surgeon: Lanelle Bal, DO;  [facility];  Service: Endoscopy;;   SINOSCOPY     ULNAR TUNNEL RELEASE Left 07/19/2022   Procedure: LEFT CUBITAL TUNNEL RELEASE;  Surgeon: Eldred Manges, MD;  Location: [facility];  Service: Orthopedics;  Laterality: Left;   Patient Active Problem List   Diagnosis Date Noted   Cubital tunnel  syndrome on left 06/29/2022   Carpal tunnel syndrome of right wrist 06/12/2022   Ulnar neuropathy at elbow 06/12/2022   Low back pain 10/13/2021   Carpal tunnel syndrome, left upper limb 04/28/2021   Bee sting 12/05/2019   Obstructive sleep apnea 03/31/2019   Acute blood loss anemia 01/20/2019   Tachycardia 01/20/2019   Lower GI bleed    Weakness of both lower extremities 01/15/2019   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   Rectal bleeding 01/15/2019   Gastroesophageal reflux disease 01/15/2019   Shortness of breath 01/15/2019   H/O adenomatous polyp of colon 11/14/2018   Hypertension 03/29/2018   Hyperlipidemia 03/29/2018    Meningioma (HCC) 09/20/2012    PCP: Kirstie Peri MD  REFERRING PROVIDER: Kirstie Peri MD  REFERRING DIAG:  [phone] (ICD-10-CM) - Cervical radiculopathy  M54.2 (ICD-10-CM) - Cervicalgia  M25.512 (ICD-10-CM) - Left shoulder pain, unspecified chronicity    THERAPY DIAG:  Cervicalgia  Abnormal posture  Rationale for Evaluation and Treatment: Rehabilitation  ONSET DATE: ~ 2 years  SUBJECTIVE:                                                                                                                                                                                                         SUBJECTIVE STATEMENT: Patient reports he had surgery 5/15 left medial elbow per Dr. Ophelia Charter; stitches out last week; looking Right CPT surgery sometime in August.  Neck is a little better overall; 5-6/10 pain today but pain never goes away all the way; going to try a chiropractor next week    EVAL:Pt reporting that he has been having neck pain for approximately 2 years.  He reported that his neck pain is primarily in his left neck and left upper shoulder blade.  He also reported left mass that started occurring in January of this year and he has seen 3 doctors have regarded the mass as just "fat."  Patient states that the mass will be bigger in the morning get smaller throughout the day. 5-6/10 currently, previously 8-9/10. Reports sharp, sharp pain. Hand dominance: Right  PERTINENT HISTORY:    PAIN:  Are you having pain? Yes: NPRS scale: 6/10 Pain location: Proximal left scapular muscular pain and left lateral neck Pain description: sharp Aggravating factors: tactile pressure, laying on back.  Relieving factors: "don't know"  PRECAUTIONS: None  WEIGHT BEARING RESTRICTIONS: No  FALLS:  Has patient fallen in last 6 months? 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"  NEXT MD VISIT: *assess next session*  OBJECTIVE:   DIAGNOSTIC FINDINGS:    PATIENT SURVEYS:  FOTO 42%  COGNITION: Overall cognitive status: Within functional limits for tasks assessed  SENSATION: WFL  POSTURE: rounded shoulders and forward head  PALPATION: Edematous round mass proximal to left collarbone   CERVICAL ROM:   Active ROM A/PROM (deg) eval  Flexion WFL  Extension 50%  Right lateral flexion 50%  Left lateral flexion 50%  Right rotation 50%  Left rotation 50%   (Blank rows = not tested)  UPPER EXTREMITY ROM:  Active ROM Right eval Left eval  Shoulder flexion 90% 70%  Shoulder extension    Shoulder abduction 70% 60%  Shoulder adduction    Shoulder extension    Shoulder internal rotation 70% 60%  Shoulder external rotation 70% 60%  Elbow flexion    Elbow extension    Wrist flexion    Wrist extension    Wrist ulnar deviation    Wrist radial deviation    Wrist pronation    Wrist supination     (Blank rows = not tested)  UPPER EXTREMITY MMT:  MMT Right eval Left eval  Shoulder flexion 4+ 3+  Shoulder extension    Shoulder abduction 4- 3+  Shoulder adduction    Shoulder extension    Shoulder internal rotation 3+ 3  Shoulder external rotation 3+ 3  Middle trapezius    Lower trapezius    Elbow flexion    Elbow extension    Wrist flexion    Wrist extension    Wrist ulnar deviation    Wrist radial deviation    Wrist pronation    Wrist supination    Grip strength     (Blank rows = not tested)  CERVICAL SPECIAL TESTS:  Neck flexor muscle endurance test: next session and Upper limb tension test (ULTT): next session  FUNCTIONAL TESTS:    TODAY'S TREATMENT:                                                                                                                              DATE:  08/09/22 Review of HEP and  goals Seated scapular retractions 3" hold x 10 Grip putty green x 5' Upper trap stretch 3 x 20" each side Cervical AROM x 10 each rotation, flexion and extension Cervical retractions x 10   07/11/22 PT Evaluation, HEP, Attendance Policy.    PATIENT EDUCATION:  Education details: PT evaluation, posture, diet/nutrition/physical activity requirements. Person educated: Patient Education method: Medical illustrator Education comprehension: verbalized understanding  HOME EXERCISE PROGRAM: Access Code: [email or web address]   Exercises - Seated Scapular Retraction  - 2 x daily - 7 x weekly - 1 sets - 10 reps - Putty Squeezes  - 2 x daily - 7 x weekly - 1 sets - 10 reps - Seated Cervical Rotation  AROM  - 2 x daily - 7 x weekly - 1 sets - 10 reps - Seated Cervical Extension AROM  - 2 x daily - 7 x weekly - 1 sets - 10 reps - Seated Cervical Flexion AROM  - 2 x daily - 7 x weekly - 1 sets - 10 reps - Seated Upper Trapezius Stretch  - 2 x daily - 7 x weekly - 1 sets - 3 reps - 20 sec hold - Seated Cervical Retraction  - 2 x daily - 7 x weekly - 1 sets - 10 reps  Access Code: [email or web address] URL: [email or web address] Date: 07/11/2022 Prepared by: Starling Manns  Exercises - Seated Scapular Retraction  - 1 x daily - 7 x weekly - 3 sets - 10 reps  ASSESSMENT:  CLINICAL IMPRESSION: Today's session started with a review of HEP and goals.   Patient agreeable to set rehab goals. Updated HEP; issued grip putty for home use.  Healing incision left medial elbow noted.  Patient with most discomfort with turning his head to the left; left cervical rotation; cause a pinch sensation left upper trap area where swollen area is present. Did not try heavy Upper extremity exercises today as patient still recovering from left elbow surgery.  Patient will benefit from continued skilled therapy services to address deficits and promote return to optimal function.      Eval:Pt is a pleasant 62 year old  male with who is presenting to physical therapy today for neck and shoulder pain on his left side.  Patient is referred to physical therapy by his PCP for cervicalgia and left shoulder pain. Pt's past medical history includes: Bouts of physical therapy in 2023 for similar symptoms, multiple blood pressure medications, and various checkups for new fat mass on left side of neck.. Pt currently is on disability limited in doing a lot outside the home due to reports of increased dizziness with excessive walking, patient has just started riding recumbent bike at home 3X a week, with changes in diet as well.  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  Based upon today's evaluation, pt is demonstrating functional activity impairments, limited activity tolerance, impaired upper extremity activities of daily living with little L UE, poor sitting and standing postural performance due to ROM deficits, left UE weakness, myofascial pain and soft tissue mobility.  Patient would benefit from skilled physical therapy services to address the above impairments/limitations and improve functional UE mobility and overall QOL..    OBJECTIVE IMPAIRMENTS: decreased mobility, decreased ROM, decreased strength, hypomobility, increased edema, increased fascial restrictions, impaired flexibility, impaired UE functional use, and postural dysfunction.   ACTIVITY LIMITATIONS: carrying, lifting, sitting, and standing  PARTICIPATION LIMITATIONS: cleaning, laundry, driving, community activity, occupation, and yard work  PERSONAL FACTORS: Age, Behavior pattern, Fitness, and Past/current experiences are also affecting patient's functional outcome.   REHAB POTENTIAL: Good  CLINICAL DECISION MAKING: Evolving/moderate complexity  EVALUATION COMPLEXITY: Moderate   GOALS: Goals reviewed with patient? No  SHORT TERM GOALS: Target date: 08/22/2022  Patient will be independent with  HEP in order to demonstrate participation in Physical Therapy POC.  Baseline: See education section Goal status: IN PROGRESS  2.  Patient will report 4/10 pain at rest to demonstrate improved reduced pain and impact on functional activities. Baseline:  Goal status: IN PROGRESS  LONG TERM GOALS: Target date: 09/12/2022  Patient will report 2/10 pain at rest and with activities to demonstrate improved  functional pain tolerance with L UE activities. Baseline: See pain section Goal status: IN PROGRESS  2.  Patient will improved left UE MMT to 4/5 in order to demonstrate improved muscular strength and endurance. Baseline: See objective Goal status: IN PROGRESS  3.  Patient will improve gross cervical ROM to >65% in order to demonstrate improved cervical range of motion and muscle extensibility. Baseline: See objective Goal status: IN PROGRESS  4.  Patient will improve Foto score by >8 points to demonstrate improved functional outcomes and reduced pain levels. Baseline: See objective Goal status: IN PROGRESS   PLAN:  PT FREQUENCY: 1-2x/week  PT DURATION: 6 weeks  PLANNED INTERVENTIONS: Therapeutic exercises, Therapeutic activity, Neuromuscular re-education, Balance training, Gait training, Patient/Family education, Self Care, Joint mobilization, Manual therapy, and Re-evaluation  PLAN FOR NEXT SESSION: Heavy scapular strengthening, rotator cuff strengthening, cervical isometrics   8:58 AM, 08/09/22 Lemuel Boodram Small Michah Minton MPT [facility] [place] [phone] [phone]

## 2022-08-10 ENCOUNTER — Encounter: Payer: Self-pay | Admitting: Gastroenterology

## 2022-08-10 ENCOUNTER — Ambulatory Visit: Payer: 59 | Admitting: Gastroenterology

## 2022-08-10 ENCOUNTER — Encounter: Payer: Self-pay | Admitting: *Deleted

## 2022-08-10 VITALS — BP 122/80 | HR 90 | Temp 97.7°F | Ht 70.0 in | Wt 312.4 lb

## 2022-08-10 DIAGNOSIS — Z8711 Personal history of peptic ulcer disease: Secondary | ICD-10-CM | POA: Diagnosis not present

## 2022-08-10 DIAGNOSIS — R1013 Epigastric pain: Secondary | ICD-10-CM | POA: Diagnosis not present

## 2022-08-10 MED ORDER — PANTOPRAZOLE SODIUM 40 MG PO TBEC
40.0000 mg | DELAYED_RELEASE_TABLET | Freq: Every day | ORAL | 3 refills | Status: DC
Start: 2022-08-10 — End: 2022-11-30

## 2022-08-10 NOTE — Patient Instructions (Addendum)
We will arrange for you to have an upper endoscopy with Dr. Marletta Lor at [facility] in the near future.  Start pantoprazole 40 mg daily 30 minutes before breakfast.  Continue to avoid all NSAID products.  You may take Tylenol as needed.  We will follow-up with you in the office after your endoscopy.  If you have any worsening symptoms or questions/concerns, please let me know.  It was good to see you today!  Ermalinda Memos, PA-C [facility]

## 2022-08-11 ENCOUNTER — Encounter: Payer: Self-pay | Admitting: *Deleted

## 2022-08-15 ENCOUNTER — Telehealth: Payer: Self-pay

## 2022-08-15 NOTE — Telephone Encounter (Signed)
I called patient to discuss scheduling right CTR.  Left voice mail for him to return my call.

## 2022-08-16 ENCOUNTER — Ambulatory Visit (HOSPITAL_COMMUNITY): Payer: 59

## 2022-08-16 ENCOUNTER — Encounter (HOSPITAL_COMMUNITY): Payer: Self-pay

## 2022-08-16 DIAGNOSIS — G8929 Other chronic pain: Secondary | ICD-10-CM

## 2022-08-16 DIAGNOSIS — R29898 Other symptoms and signs involving the musculoskeletal system: Secondary | ICD-10-CM | POA: Diagnosis not present

## 2022-08-16 DIAGNOSIS — M542 Cervicalgia: Secondary | ICD-10-CM

## 2022-08-16 DIAGNOSIS — M25511 Pain in right shoulder: Secondary | ICD-10-CM

## 2022-08-16 DIAGNOSIS — M25512 Pain in left shoulder: Secondary | ICD-10-CM | POA: Diagnosis not present

## 2022-08-16 DIAGNOSIS — R293 Abnormal posture: Secondary | ICD-10-CM

## 2022-08-16 NOTE — Therapy (Signed)
OUTPATIENT PHYSICAL THERAPY CERVICAL TREATMENT   Patient Name: Micheal Ross. MRN: [id number] DOB:December 02, 1960, 62 y.o., male Today's Date: 08/16/2022  END OF SESSION:  PT End of Session - 08/16/22 0858     Visit Number 3    Number of Visits 30    Date for PT Re-Evaluation 09/12/22    Authorization Type [employer]    Authorization - Visit Number 2    Authorization - Number of Visits 30    Progress Note Due on Visit 14    PT Start Time 0900    PT Stop Time 0944    PT Time Calculation (min) 44 min    Activity Tolerance Patient tolerated treatment well;Patient limited by pain    Behavior During Therapy WFL for tasks assessed/performed             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)   Dizziness    Hypercholesterolemia    Hypertension    Neck pain    Shoulder pain    Sleep apnea    uses bipap at home   Past Surgical History:  Procedure Laterality Date   ANKLE SURGERY     BIOPSY  04/21/2022   Procedure: BIOPSY;  Surgeon: Lanelle Bal, DO;  Location: [facility];  Service: Endoscopy;;   brain tumor removal  2014   CARPAL TUNNEL RELEASE Left 08/15/2021   Procedure: LEFT CARPAL TUNNEL RELEASE;  Surgeon: Eldred Manges, MD;  Location: [facility];  Service: Orthopedics;  Laterality: Left;   COLONOSCOPY WITH PROPOFOL N/A 01/13/2019   Procedure: COLONOSCOPY WITH PROPOFOL;  Surgeon: Corbin Ade, MD;  20 mm polyp found in the cecum that was removed via piecemeal hot snare polypectomy. Repeat in 4 months. path tubulovillous adenoma, negative for high-grade dysplasia   COLONOSCOPY WITH PROPOFOL N/A 01/20/2019   Procedure: COLONOSCOPY WITH PROPOFOL;  Surgeon: West Bali, MD; large defect (2 cm x 1.5 cm with small visible vessel with stigmata of recent bleed) in the cecum.  3 hemostatic clips placed (MR conditional).  One 3 mm polyp in the cecum, tortuous colon, external and  internal hemorrhoids.  Pathology revealed tubular adenoma.   COLONOSCOPY WITH PROPOFOL N/A 10/06/2021   Procedure: COLONOSCOPY WITH PROPOFOL;  Surgeon: Lanelle Bal, DO;  Location: [facility];  Service: Endoscopy;  Laterality: N/A;  7:30am   ESOPHAGOGASTRODUODENOSCOPY (EGD) WITH PROPOFOL N/A 04/21/2022   Procedure: ESOPHAGOGASTRODUODENOSCOPY (EGD) WITH PROPOFOL;  Surgeon: Lanelle Bal, DO;  Location: [facility];  Service: Endoscopy;  Laterality: N/A;  145pm, asa 3, pt can't come earlier - transportation   HERNIA REPAIR  2010   umbilical   NASAL SEPTOPLASTY W/ TURBINOPLASTY Bilateral 11/30/2020   Procedure: NASAL SEPTOPLASTY WITH BILATERAL  TURBINATE REDUCTION;  Surgeon: Newman Pies, MD;  Location: [facility];  Service: ENT;  Laterality: Bilateral;   POLYPECTOMY  01/13/2019   Procedure: POLYPECTOMY;  Surgeon: Corbin Ade, MD;  Location: [facility];  Service: Endoscopy;;  Large Cecal Polyp    POLYPECTOMY  10/06/2021   Procedure: POLYPECTOMY;  Surgeon: Lanelle Bal, DO;  Location: [facility];  Service: Endoscopy;;   SINOSCOPY     ULNAR TUNNEL RELEASE Left 07/19/2022   Procedure: LEFT CUBITAL TUNNEL RELEASE;  Surgeon: Eldred Manges, MD;  Location:  [facility];  Service: Orthopedics;  Laterality: Left;   Patient Active  Problem List   Diagnosis Date Noted   Cubital tunnel syndrome on left 06/29/2022   Carpal tunnel syndrome of right wrist 06/12/2022   Ulnar neuropathy at elbow 06/12/2022   Low back pain 10/13/2021   Carpal tunnel syndrome, left upper limb 04/28/2021   Bee sting 12/05/2019   Obstructive sleep apnea 03/31/2019   Acute blood loss anemia 01/20/2019   Tachycardia 01/20/2019   Lower GI bleed    Weakness of both lower extremities 01/15/2019   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   Rectal bleeding 01/15/2019   Gastroesophageal reflux disease 01/15/2019   Shortness of breath 01/15/2019   H/O adenomatous polyp of colon 11/14/2018    Hypertension 03/29/2018   Hyperlipidemia 03/29/2018   Meningioma (HCC) 09/20/2012    PCP: Kirstie Peri MD  REFERRING PROVIDER: Kirstie Peri MD  REFERRING DIAG:  M54.12 (ICD-10-CM) - Cervical radiculopathy  M54.2 (ICD-10-CM) - Cervicalgia  M25.512 (ICD-10-CM) - Left shoulder pain, unspecified chronicity    THERAPY DIAG:  Cervicalgia  Abnormal posture  Chronic left shoulder pain  Other symptoms and signs involving the musculoskeletal system  Acute pain of right shoulder  Neck pain  Rationale for Evaluation and Treatment: Rehabilitation  ONSET DATE: ~ 2 years  SUBJECTIVE:                                                                                                                                                                                                         SUBJECTIVE STATEMENT: 08/16/22:  Pt stated he is going to chiropractor tomorrow.  Reports constant dizziness that increases with head movements and while standing, making fast movements.  Reports compliance with HEP.  Lt shoulder is really swollen, pain scale 5/10.    EVAL:Pt reporting that he has been having neck pain for approximately 2 years.  He reported that his neck pain is primarily in his left neck and left upper shoulder blade.  He also reported left mass that started occurring in January of this year and he has seen 3 doctors have regarded the mass as just "fat."  Patient states that the mass will be bigger in the morning get smaller throughout the day. 5-6/10 currently, previously 8-9/10. Reports sharp, sharp pain. Hand dominance: Right  PERTINENT HISTORY:    PAIN:  Are you having pain? Yes: NPRS scale: 5/10 Pain location: Proximal left scapular muscular pain and left lateral neck Pain description: sharp Aggravating factors: tactile pressure, laying on back.  Relieving factors: "don't know"  PRECAUTIONS: None  WEIGHT BEARING RESTRICTIONS: No  FALLS:  Has patient fallen in last 6 months?  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"  NEXT MD VISIT: 1/61/09  OBJECTIVE:   DIAGNOSTIC FINDINGS:    PATIENT SURVEYS:  FOTO 42%  COGNITION: Overall cognitive status: Within functional limits for tasks assessed  SENSATION: WFL  POSTURE: rounded shoulders and forward head  PALPATION: Edematous round mass proximal to left collarbone   CERVICAL ROM:   Active ROM A/PROM (deg) eval  Flexion WFL  Extension 50%  Right lateral flexion 50%  Left lateral flexion 50%  Right rotation 50%  Left rotation 50%   (Blank rows = not tested)  UPPER EXTREMITY ROM:  Active ROM Right eval Left eval  Shoulder flexion 90% 70%  Shoulder extension    Shoulder abduction 70% 60%  Shoulder adduction    Shoulder extension    Shoulder internal rotation 70% 60%  Shoulder external rotation 70% 60%  Elbow flexion    Elbow extension    Wrist flexion    Wrist extension    Wrist ulnar deviation    Wrist radial deviation    Wrist pronation    Wrist supination     (Blank rows = not tested)  UPPER EXTREMITY MMT:  MMT Right eval Left eval  Shoulder flexion 4+ 3+  Shoulder extension    Shoulder abduction 4- 3+  Shoulder adduction    Shoulder extension    Shoulder internal rotation 3+ 3  Shoulder external rotation 3+ 3  Middle trapezius    Lower trapezius    Elbow flexion    Elbow extension    Wrist flexion    Wrist extension    Wrist ulnar deviation    Wrist radial deviation    Wrist pronation    Wrist supination    Grip strength     (Blank rows = not tested)  CERVICAL SPECIAL TESTS:  Neck flexor muscle endurance test: 08/16/22: 5.3" with reports of pain on Rt side of neck and Upper limb tension test (ULTT): next session  FUNCTIONAL TESTS:     TODAY'S TREATMENT:                                                                                                                              DATE:  08/16/22: Seated: Cervical retraction 10x 2 sets  Seated ER with theraband Cervical AROM x 10 each rotation, flexion and extension Isometric cervical sidebend and rotation 5x 5" Scapular retraction 10x Posterior shoulders rolls- up, back and down 10x slowly Supine: neck flexor endurance testing- 5.3" max of 3 with reports of neck pain   08/09/22 Review of HEP and goals Seated scapular retractions 3" hold x 10 Grip putty green x 5' Upper trap stretch 3 x 20" each side Cervical AROM x 10 each rotation, flexion and extension Cervical retractions  x 10   07/11/22 PT Evaluation, HEP, Attendance Policy.    PATIENT EDUCATION:  Education details: PT evaluation, posture, diet/nutrition/physical activity requirements. Person educated: Patient Education method: Medical illustrator Education comprehension: verbalized understanding  HOME EXERCISE PROGRAM: Access Code: [email or web address]   Exercises - Seated Scapular Retraction  - 2 x daily - 7 x weekly - 1 sets - 10 reps - Putty Squeezes  - 2 x daily - 7 x weekly - 1 sets - 10 reps - Seated Cervical Rotation AROM  - 2 x daily - 7 x weekly - 1 sets - 10 reps - Seated Cervical Extension AROM  - 2 x daily - 7 x weekly - 1 sets - 10 reps - Seated Cervical Flexion AROM  - 2 x daily - 7 x weekly - 1 sets - 10 reps - Seated Upper Trapezius Stretch  - 2 x daily - 7 x weekly - 1 sets - 3 reps - 20 sec hold - Seated Cervical Retraction  - 2 x daily - 7 x weekly - 1 sets - 10 reps  Access Code: [email or web address] URL: [email or web address] Date: 07/11/2022 Prepared by: Starling Manns  Exercises - Seated Scapular Retraction  - 1 x daily - 7 x weekly - 3 sets - 10 reps  ASSESSMENT:  CLINICAL IMPRESSION: Session focus with cervical mobility and postural strengthening.  Pt reports of  decreased popping, no c/o pinching sensation, and pain reduced with left cervical rotation though continues to be limited range.  Added isometric and postural strengthening exercises, monitored pain through session.  Reports of relief following posterior shoulder rolls, add to HEP.  Educated benefits with deep breathing to address CNS for pain control.  Neck flexor endurance testing complete with 5.3" max and pain with testing.    Eval:Pt is a pleasant 62 year old male with who is presenting to physical therapy today for neck and shoulder pain on his left side.  Patient is referred to physical therapy by his PCP for cervicalgia and left shoulder pain. Pt's past medical history includes: Bouts of physical therapy in 2023 for similar symptoms, multiple blood pressure medications, and various checkups for new fat mass on left side of neck.. Pt currently is on disability limited in doing a lot outside the home due to reports of increased dizziness with excessive walking, patient has just started riding recumbent bike at home 3X a week, with changes in diet as well.  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  Based upon today's evaluation, pt is demonstrating functional activity impairments, limited activity tolerance, impaired upper extremity activities of daily living with little L UE, poor sitting and standing postural performance due to ROM deficits, left UE weakness, myofascial pain and soft tissue mobility.  Patient would benefit from skilled physical therapy services to address the above impairments/limitations and improve functional UE mobility and overall QOL..    OBJECTIVE IMPAIRMENTS: decreased mobility, decreased ROM, decreased strength, hypomobility, increased edema, increased fascial restrictions, impaired flexibility, impaired UE functional use, and postural dysfunction.   ACTIVITY LIMITATIONS: carrying, lifting, sitting, and  standing  PARTICIPATION LIMITATIONS: cleaning, laundry, driving, community activity, occupation, and yard work  PERSONAL FACTORS: Age, Behavior pattern, Fitness, and Past/current experiences are also affecting patient's functional outcome.   REHAB POTENTIAL: Good  CLINICAL DECISION MAKING: Evolving/moderate complexity  EVALUATION COMPLEXITY: Moderate   GOALS: Goals reviewed with patient? No  SHORT TERM GOALS: Target date: 08/22/2022  Patient will be  independent with HEP in order to demonstrate participation in Physical Therapy POC.  Baseline: See education section Goal status: IN PROGRESS  2.  Patient will report 4/10 pain at rest to demonstrate improved reduced pain and impact on functional activities. Baseline:  Goal status: IN PROGRESS  LONG TERM GOALS: Target date: 09/12/2022  Patient will report 2/10 pain at rest and with activities to demonstrate improved functional pain tolerance with L UE activities. Baseline: See pain section Goal status: IN PROGRESS  2.  Patient will improved left UE MMT to 4/5 in order to demonstrate improved muscular strength and endurance. Baseline: See objective Goal status: IN PROGRESS  3.  Patient will improve gross cervical ROM to >65% in order to demonstrate improved cervical range of motion and muscle extensibility. Baseline: See objective Goal status: IN PROGRESS  4.  Patient will improve Foto score by >8 points to demonstrate improved functional outcomes and reduced pain levels. Baseline: See objective Goal status: IN PROGRESS   PLAN:  PT FREQUENCY: 1-2x/week  PT DURATION: 6 weeks  PLANNED INTERVENTIONS: Therapeutic exercises, Therapeutic activity, Neuromuscular re-education, Balance training, Gait training, Patient/Family education, Self Care, Joint mobilization, Manual therapy, and Re-evaluation  PLAN FOR NEXT SESSION: Heavy scapular strengthening, rotator cuff strengthening, cervical isometrics  Becky Sax, LPTA/CLT;  [facility] [phone]  Juel Burrow, PTA 08/16/2022, 9:59 AM  9:59 AM, 08/16/22

## 2022-08-17 DIAGNOSIS — M531 Cervicobrachial syndrome: Secondary | ICD-10-CM | POA: Diagnosis not present

## 2022-08-17 DIAGNOSIS — M47812 Spondylosis without myelopathy or radiculopathy, cervical region: Secondary | ICD-10-CM | POA: Diagnosis not present

## 2022-08-17 DIAGNOSIS — M47816 Spondylosis without myelopathy or radiculopathy, lumbar region: Secondary | ICD-10-CM | POA: Diagnosis not present

## 2022-08-17 DIAGNOSIS — S233XXA Sprain of ligaments of thoracic spine, initial encounter: Secondary | ICD-10-CM | POA: Diagnosis not present

## 2022-08-23 ENCOUNTER — Encounter (HOSPITAL_COMMUNITY): Payer: 59

## 2022-08-25 DIAGNOSIS — G4733 Obstructive sleep apnea (adult) (pediatric): Secondary | ICD-10-CM | POA: Diagnosis not present

## 2022-08-29 DIAGNOSIS — H2513 Age-related nuclear cataract, bilateral: Secondary | ICD-10-CM | POA: Diagnosis not present

## 2022-08-29 DIAGNOSIS — H25043 Posterior subcapsular polar age-related cataract, bilateral: Secondary | ICD-10-CM | POA: Diagnosis not present

## 2022-08-30 ENCOUNTER — Encounter (HOSPITAL_COMMUNITY): Payer: 59

## 2022-09-01 DIAGNOSIS — M531 Cervicobrachial syndrome: Secondary | ICD-10-CM | POA: Diagnosis not present

## 2022-09-01 DIAGNOSIS — M47812 Spondylosis without myelopathy or radiculopathy, cervical region: Secondary | ICD-10-CM | POA: Diagnosis not present

## 2022-09-01 DIAGNOSIS — S233XXA Sprain of ligaments of thoracic spine, initial encounter: Secondary | ICD-10-CM | POA: Diagnosis not present

## 2022-09-01 DIAGNOSIS — M47816 Spondylosis without myelopathy or radiculopathy, lumbar region: Secondary | ICD-10-CM | POA: Diagnosis not present

## 2022-09-06 ENCOUNTER — Ambulatory Visit (HOSPITAL_COMMUNITY): Payer: 59 | Attending: Neurology

## 2022-09-06 ENCOUNTER — Encounter: Payer: Self-pay | Admitting: Cardiology

## 2022-09-06 DIAGNOSIS — M542 Cervicalgia: Secondary | ICD-10-CM | POA: Diagnosis not present

## 2022-09-06 DIAGNOSIS — G8929 Other chronic pain: Secondary | ICD-10-CM | POA: Diagnosis not present

## 2022-09-06 DIAGNOSIS — R293 Abnormal posture: Secondary | ICD-10-CM | POA: Diagnosis not present

## 2022-09-06 DIAGNOSIS — M25512 Pain in left shoulder: Secondary | ICD-10-CM | POA: Insufficient documentation

## 2022-09-06 NOTE — Therapy (Signed)
OUTPATIENT PHYSICAL THERAPY CERVICAL DISCHARGE PHYSICAL THERAPY DISCHARGE SUMMARY  Visits from Start of Care: 4  Current functional level related to goals / functional outcomes: See below   Remaining deficits: See below   Education / Equipment: See below   Patient agrees to discharge. Patient goals were not met. Patient is being discharged due to the patient's request. Currently following up with a chiropractor.    Patient Name: Micheal Ross. MRN: [id number] DOB:1960-05-14, 62 y.o., male Today's Date: 09/06/2022  END OF SESSION:  PT End of Session - 09/06/22 0904     Visit Number 4    Number of Visits 30    Date for PT Re-Evaluation 09/12/22    Authorization Type [employer]    Authorization - Visit Number 3    Authorization - Number of Visits 30    Progress Note Due on Visit 14    PT Start Time 0905    PT Stop Time 0945    PT Time Calculation (min) 40 min    Activity Tolerance Patient tolerated treatment well;Patient limited by pain    Behavior During Therapy WFL for tasks assessed/performed             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)   Dizziness    Hypercholesterolemia    Hypertension    Neck pain    Shoulder pain    Sleep apnea    uses bipap at home   Past Surgical History:  Procedure Laterality Date   ANKLE SURGERY     BIOPSY  04/21/2022   Procedure: BIOPSY;  Surgeon: Lanelle Bal, DO;  [facility]: [facility];  Service: Endoscopy;;   brain tumor removal  2014   CARPAL TUNNEL RELEASE Left 08/15/2021   Procedure: LEFT CARPAL TUNNEL RELEASE;  Surgeon: Eldred Manges, MD;  Location: [facility];  Service: Orthopedics;  Laterality: Left;   COLONOSCOPY WITH PROPOFOL N/A 01/13/2019   Procedure: COLONOSCOPY WITH PROPOFOL;  Surgeon: Corbin Ade, MD;  20 mm polyp found in the cecum that was removed via piecemeal hot snare polypectomy. Repeat in 4  months. path tubulovillous adenoma, negative for high-grade dysplasia   COLONOSCOPY WITH PROPOFOL N/A 01/20/2019   Procedure: COLONOSCOPY WITH PROPOFOL;  Surgeon: West Bali, MD; large defect (2 cm x 1.5 cm with small visible vessel with stigmata of recent bleed) in the cecum.  3 hemostatic clips placed (MR conditional).  One 3 mm polyp in the cecum, tortuous colon, external and internal hemorrhoids.  Pathology revealed tubular adenoma.   COLONOSCOPY WITH PROPOFOL N/A 10/06/2021   Procedure: COLONOSCOPY WITH PROPOFOL;  Surgeon: Lanelle Bal, DO;  Location: [facility];  Service: Endoscopy;  Laterality: N/A;  7:30am   ESOPHAGOGASTRODUODENOSCOPY (EGD) WITH PROPOFOL N/A 04/21/2022   Procedure: ESOPHAGOGASTRODUODENOSCOPY (EGD) WITH PROPOFOL;  Surgeon: Lanelle Bal, DO;  Location: [facility];  Service: Endoscopy;  Laterality: N/A;  145pm, asa 3, pt can't come earlier - transportation   HERNIA REPAIR  2010   umbilical   NASAL SEPTOPLASTY W/ TURBINOPLASTY Bilateral 11/30/2020   Procedure: NASAL SEPTOPLASTY WITH BILATERAL  TURBINATE REDUCTION;  Surgeon: Newman Pies, MD;  Location: [facility];  Service: ENT;  Laterality: Bilateral;   POLYPECTOMY  01/13/2019   Procedure: POLYPECTOMY;  Surgeon: Corbin Ade, MD;  Location: [facility];  Service: Endoscopy;;  Large Cecal Polyp  POLYPECTOMY  10/06/2021   Procedure: POLYPECTOMY;  Surgeon: Lanelle Bal, DO;  Location: [facility];  Service: Endoscopy;;   SINOSCOPY     ULNAR TUNNEL RELEASE Left 07/19/2022   Procedure: LEFT CUBITAL TUNNEL RELEASE;  Surgeon: Eldred Manges, MD;  Location: [facility];  Service: Orthopedics;  Laterality: Left;   Patient Active Problem List   Diagnosis Date Noted   Cubital tunnel syndrome on left 06/29/2022   Carpal tunnel syndrome of right wrist 06/12/2022   Ulnar neuropathy at elbow 06/12/2022   Low back pain 10/13/2021   Carpal tunnel syndrome, left upper limb  04/28/2021   Bee sting 12/05/2019   Obstructive sleep apnea 03/31/2019   Acute blood loss anemia 01/20/2019   Tachycardia 01/20/2019   Lower GI bleed    Weakness of both lower extremities 01/15/2019   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   Rectal bleeding 01/15/2019   Gastroesophageal reflux disease 01/15/2019   Shortness of breath 01/15/2019   H/O adenomatous polyp of colon 11/14/2018   Hypertension 03/29/2018   Hyperlipidemia 03/29/2018   Meningioma (HCC) 09/20/2012    PCP: Kirstie Peri MD  REFERRING PROVIDER: Kirstie Peri MD  REFERRING DIAG:  M54.12 (ICD-10-CM) - Cervical radiculopathy  M54.2 (ICD-10-CM) - Cervicalgia  M25.512 (ICD-10-CM) - Left shoulder pain, unspecified chronicity    THERAPY DIAG:  Cervicalgia  Abnormal posture  Chronic left shoulder pain  Rationale for Evaluation and Treatment: Rehabilitation  ONSET DATE: ~ 2 years  SUBJECTIVE:                                                                                                                                                                                                         SUBJECTIVE STATEMENT: Seeing a chiropractor for dizziness as well and some adjustments for his neck. 4/10 pain this morning.  No change in symptoms since start of therapy.   Ready for discharge.     EVAL:Pt reporting that he has been having neck pain for approximately 2 years.  He reported that his neck pain is primarily in his left neck and left upper shoulder blade.  He also reported left mass that started occurring in January of this year and he has seen 3 doctors have regarded the mass as just "fat."  Patient states that the mass will be bigger in the morning get smaller throughout the day. 5-6/10 currently, previously 8-9/10. Reports sharp, sharp pain. Hand dominance: Right  PERTINENT HISTORY:    PAIN:  Are you having pain? Yes: NPRS scale: 5/10 Pain location: Proximal left scapular muscular pain  and left lateral neck Pain  description: sharp Aggravating factors: tactile pressure, laying on back.  Relieving factors: "don't know"  PRECAUTIONS: None  WEIGHT BEARING RESTRICTIONS: No  FALLS:  Has patient fallen in last 6 months? 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"  NEXT MD VISIT: 1/61/09  OBJECTIVE:   DIAGNOSTIC FINDINGS:    PATIENT SURVEYS:  FOTO 42%  COGNITION: Overall cognitive status: Within functional limits for tasks assessed  SENSATION: WFL  POSTURE: rounded shoulders and forward head  PALPATION: Edematous round mass proximal to left collarbone   CERVICAL ROM:   Active ROM A/PROM (deg) eval AROM degrees  Flexion WFL 10  Extension 50% 36  Right lateral flexion 50% 33  Left lateral flexion 50% 34  Right rotation 50% 49  Left rotation 50% 42   (Blank rows = not tested)  UPPER EXTREMITY ROM:  Active ROM Right eval Left eval  Shoulder flexion 90% 70%  Shoulder extension    Shoulder abduction 70% 60%  Shoulder adduction    Shoulder extension    Shoulder internal rotation 70% 60%  Shoulder external rotation 70% 60%  Elbow flexion    Elbow extension    Wrist flexion    Wrist extension    Wrist ulnar deviation    Wrist radial deviation    Wrist pronation    Wrist supination     (Blank rows = not tested)  UPPER EXTREMITY MMT:  MMT Right eval Left eval Right 09/06/22 Left 09/06/22  Shoulder flexion 4+ 3+ 4+ 4  Shoulder extension      Shoulder abduction 4- 3+ 4 4-  Shoulder adduction      Shoulder extension      Shoulder internal rotation 3+ 3    Shoulder external rotation 3+ 3    Middle trapezius      Lower trapezius      Elbow flexion   4+ 4  Elbow extension   4+ 4  Wrist flexion      Wrist extension      Wrist ulnar  deviation      Wrist radial deviation      Wrist pronation      Wrist supination      Grip strength       (Blank rows = not tested)  CERVICAL SPECIAL TESTS:  Neck flexor muscle endurance test: 08/16/22: 5.3" with reports of pain on Rt side of neck and Upper limb tension test (ULTT): next session  FUNCTIONAL TESTS:    TODAY'S TREATMENT:                                                                                                                              DATE:  09/06/22 Progress/discharge note FOTO 33 AROM of  cervical spine see above UE MMT's Review of HEP Updated HEP   08/16/22: Seated: Cervical retraction 10x 2 sets  Seated ER with theraband Cervical AROM x 10 each rotation, flexion and extension Isometric cervical sidebend and rotation 5x 5" Scapular retraction 10x Posterior shoulders rolls- up, back and down 10x slowly Supine: neck flexor endurance testing- 5.3" max of 3 with reports of neck pain   08/09/22 Review of HEP and goals Seated scapular retractions 3" hold x 10 Grip putty green x 5' Upper trap stretch 3 x 20" each side Cervical AROM x 10 each rotation, flexion and extension Cervical retractions x 10   07/11/22 PT Evaluation, HEP, Attendance Policy.    PATIENT EDUCATION:  Education details: PT evaluation, posture, diet/nutrition/physical activity requirements. Person educated: Patient Education method: Medical illustrator Education comprehension: verbalized understanding  HOME EXERCISE PROGRAM: Access Code: [email or web address]   Exercises - Seated Scapular Retraction  - 2 x daily - 7 x weekly - 1 sets - 10 reps - Putty Squeezes  - 2 x daily - 7 x weekly - 1 sets - 10 reps - Seated Cervical Rotation AROM  - 2 x daily - 7 x weekly - 1 sets - 10 reps - Seated Cervical Extension AROM  - 2 x daily - 7 x weekly - 1 sets - 10 reps - Seated Cervical Flexion AROM  - 2 x daily - 7 x weekly - 1 sets - 10 reps - Seated Upper Trapezius Stretch  - 2 x daily - 7  x weekly - 1 sets - 3 reps - 20 sec hold - Seated Cervical Retraction  - 2 x daily - 7 x weekly - 1 sets - 10 reps  Access Code: [email or web address] URL: [email or web address] Date: 07/11/2022 Prepared by: Starling Manns  Exercises - Seated Scapular Retraction  - 1 x daily - 7 x weekly - 3 sets - 10 reps  ASSESSMENT:  CLINICAL IMPRESSION: Progress note/discharge note today.  Patient has made some improvement with upper extremity strength but demonstrates decreased FOTO score and continues with pain cervical spine.  He is agreeable to discharge today; he is seeing a chiropractor for 13 weeks and wants to focus on his care there.     Eval:Pt is a pleasant 62 year old male with who is presenting to physical therapy today for neck and shoulder pain on his left side.  Patient is referred to physical therapy by his PCP for cervicalgia and left shoulder pain. Pt's past medical history includes: Bouts of physical therapy in 2023 for similar symptoms, multiple blood pressure medications, and various checkups for new fat mass on left side of neck.. Pt currently is on disability limited in doing a lot outside the home due to reports of increased dizziness with excessive walking, patient has just started riding recumbent bike at home 3X a week, with changes in diet as well.  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  Based upon today's evaluation, pt is demonstrating functional activity impairments, limited activity tolerance, impaired upper extremity activities of daily living with little L UE, poor sitting and standing postural performance due to ROM deficits, left UE weakness, myofascial pain and soft tissue mobility.  Patient would benefit from skilled physical therapy services to address the above impairments/limitations and improve functional UE mobility and overall QOL..    OBJECTIVE IMPAIRMENTS: decreased mobility, decreased ROM, decreased  strength, hypomobility, increased edema, increased fascial restrictions, impaired flexibility, impaired  UE functional use, and postural dysfunction.   ACTIVITY LIMITATIONS: carrying, lifting, sitting, and standing  PARTICIPATION LIMITATIONS: cleaning, laundry, driving, community activity, occupation, and yard work  PERSONAL FACTORS: Age, Behavior pattern, Fitness, and Past/current experiences are also affecting patient's functional outcome.   REHAB POTENTIAL: Good  CLINICAL DECISION MAKING: Evolving/moderate complexity  EVALUATION COMPLEXITY: Moderate   GOALS: Goals reviewed with patient? No  SHORT TERM GOALS: Target date: 08/22/2022  Patient will be independent with HEP in order to demonstrate participation in Physical Therapy POC.  Baseline: See education section Goal status: MET  2.  Patient will report 4/10 pain at rest to demonstrate improved reduced pain and impact on functional activities. Baseline:  Goal status: MET  LONG TERM GOALS: Target date: 09/12/2022  Patient will report 2/10 pain at rest and with activities to demonstrate improved functional pain tolerance with L UE activities. Baseline: See pain section Goal status: IN PROGRESS  2.  Patient will improved left UE MMT to 4/5 in order to demonstrate improved muscular strength and endurance. Baseline: See objective Goal status: IN PROGRESS  3.  Patient will improve gross cervical ROM to >65% in order to demonstrate improved cervical range of motion and muscle extensibility. Baseline: See objective Goal status: IN PROGRESS  4.  Patient will improve Foto score by >8 points to demonstrate improved functional outcomes and reduced pain levels. Baseline: See objective Goal status: IN PROGRESS   PLAN:  PT FREQUENCY: 1-2x/week  PT DURATION: 6 weeks  PLANNED INTERVENTIONS: Therapeutic exercises, Therapeutic activity, Neuromuscular re-education, Balance training, Gait training, Patient/Family education, Self Care,  Joint mobilization, Manual therapy, and Re-evaluation  PLAN FOR NEXT SESSION: discharge  9:25 AM, 09/06/22 Toy Samarin Small Mitra Duling MPT [facility] [place] [phone] Ph:[phone]

## 2022-09-08 ENCOUNTER — Telehealth: Payer: Self-pay | Admitting: Internal Medicine

## 2022-09-08 NOTE — Telephone Encounter (Signed)
Pt has been scheduled for 09/15/22 for EGD while at office visit.

## 2022-09-08 NOTE — Telephone Encounter (Signed)
Pt had EGD in Feb 2024 and to follow up in 3 months and have repeat EGD. Pt seen [facility] in June and is to have another EGD.

## 2022-09-11 DIAGNOSIS — S233XXA Sprain of ligaments of thoracic spine, initial encounter: Secondary | ICD-10-CM | POA: Diagnosis not present

## 2022-09-11 DIAGNOSIS — M531 Cervicobrachial syndrome: Secondary | ICD-10-CM | POA: Diagnosis not present

## 2022-09-11 DIAGNOSIS — M47816 Spondylosis without myelopathy or radiculopathy, lumbar region: Secondary | ICD-10-CM | POA: Diagnosis not present

## 2022-09-11 DIAGNOSIS — M47812 Spondylosis without myelopathy or radiculopathy, cervical region: Secondary | ICD-10-CM | POA: Diagnosis not present

## 2022-09-11 NOTE — Telephone Encounter (Signed)
BP's and HR's look very good, no changes needed  Dominga Ferry MD

## 2022-09-12 ENCOUNTER — Encounter (HOSPITAL_COMMUNITY)
Admission: RE | Admit: 2022-09-12 | Discharge: 2022-09-12 | Disposition: A | Discharge status: Home / Self Care | Payer: 59 | Source: Ambulatory Visit | Attending: Internal Medicine | Admitting: Internal Medicine

## 2022-09-12 ENCOUNTER — Encounter (HOSPITAL_COMMUNITY): Payer: Self-pay

## 2022-09-14 DIAGNOSIS — K219 Gastro-esophageal reflux disease without esophagitis: Secondary | ICD-10-CM | POA: Diagnosis not present

## 2022-09-14 DIAGNOSIS — M1990 Unspecified osteoarthritis, unspecified site: Secondary | ICD-10-CM | POA: Diagnosis not present

## 2022-09-14 DIAGNOSIS — H269 Unspecified cataract: Secondary | ICD-10-CM | POA: Diagnosis not present

## 2022-09-14 DIAGNOSIS — Z809 Family history of malignant neoplasm, unspecified: Secondary | ICD-10-CM | POA: Diagnosis not present

## 2022-09-14 DIAGNOSIS — Z87891 Personal history of nicotine dependence: Secondary | ICD-10-CM | POA: Diagnosis not present

## 2022-09-14 DIAGNOSIS — J301 Allergic rhinitis due to pollen: Secondary | ICD-10-CM | POA: Diagnosis not present

## 2022-09-14 DIAGNOSIS — M792 Neuralgia and neuritis, unspecified: Secondary | ICD-10-CM | POA: Diagnosis not present

## 2022-09-14 DIAGNOSIS — I10 Essential (primary) hypertension: Secondary | ICD-10-CM | POA: Diagnosis not present

## 2022-09-14 DIAGNOSIS — E785 Hyperlipidemia, unspecified: Secondary | ICD-10-CM | POA: Diagnosis not present

## 2022-09-14 DIAGNOSIS — Z8249 Family history of ischemic heart disease and other diseases of the circulatory system: Secondary | ICD-10-CM | POA: Diagnosis not present

## 2022-09-14 DIAGNOSIS — M545 Low back pain, unspecified: Secondary | ICD-10-CM | POA: Diagnosis not present

## 2022-09-14 DIAGNOSIS — Z008 Encounter for other general examination: Secondary | ICD-10-CM | POA: Diagnosis not present

## 2022-09-15 ENCOUNTER — Encounter (HOSPITAL_COMMUNITY)
Admission: RE | Disposition: A | Discharge status: Home / Self Care | Payer: Self-pay | Source: Home / Self Care | Attending: Internal Medicine

## 2022-09-15 ENCOUNTER — Ambulatory Visit (HOSPITAL_COMMUNITY): Payer: 59 | Admitting: Anesthesiology

## 2022-09-15 ENCOUNTER — Encounter (HOSPITAL_COMMUNITY): Payer: Self-pay

## 2022-09-15 ENCOUNTER — Ambulatory Visit (HOSPITAL_BASED_OUTPATIENT_CLINIC_OR_DEPARTMENT_OTHER): Payer: 59 | Admitting: Anesthesiology

## 2022-09-15 ENCOUNTER — Ambulatory Visit (HOSPITAL_COMMUNITY)
Admission: RE | Admit: 2022-09-15 | Discharge: 2022-09-15 | Disposition: A | Discharge status: Home / Self Care | Payer: 59 | Attending: Internal Medicine | Admitting: Internal Medicine

## 2022-09-15 DIAGNOSIS — K317 Polyp of stomach and duodenum: Secondary | ICD-10-CM | POA: Diagnosis not present

## 2022-09-15 DIAGNOSIS — K219 Gastro-esophageal reflux disease without esophagitis: Secondary | ICD-10-CM | POA: Diagnosis not present

## 2022-09-15 DIAGNOSIS — K2950 Unspecified chronic gastritis without bleeding: Secondary | ICD-10-CM | POA: Diagnosis not present

## 2022-09-15 DIAGNOSIS — R1013 Epigastric pain: Secondary | ICD-10-CM | POA: Insufficient documentation

## 2022-09-15 DIAGNOSIS — I10 Essential (primary) hypertension: Secondary | ICD-10-CM | POA: Diagnosis not present

## 2022-09-15 DIAGNOSIS — K279 Peptic ulcer, site unspecified, unspecified as acute or chronic, without hemorrhage or perforation: Secondary | ICD-10-CM

## 2022-09-15 DIAGNOSIS — Z6841 Body Mass Index (BMI) 40.0 and over, adult: Secondary | ICD-10-CM

## 2022-09-15 DIAGNOSIS — G4730 Sleep apnea, unspecified: Secondary | ICD-10-CM | POA: Insufficient documentation

## 2022-09-15 DIAGNOSIS — Z8711 Personal history of peptic ulcer disease: Secondary | ICD-10-CM | POA: Insufficient documentation

## 2022-09-15 DIAGNOSIS — K2289 Other specified disease of esophagus: Secondary | ICD-10-CM | POA: Insufficient documentation

## 2022-09-15 DIAGNOSIS — Z87891 Personal history of nicotine dependence: Secondary | ICD-10-CM | POA: Diagnosis not present

## 2022-09-15 DIAGNOSIS — K2970 Gastritis, unspecified, without bleeding: Secondary | ICD-10-CM | POA: Insufficient documentation

## 2022-09-15 HISTORY — PX: BIOPSY: SHX5522

## 2022-09-15 HISTORY — PX: POLYPECTOMY: SHX5525

## 2022-09-15 HISTORY — PX: ESOPHAGOGASTRODUODENOSCOPY (EGD) WITH PROPOFOL: SHX5813

## 2022-09-15 SURGERY — ESOPHAGOGASTRODUODENOSCOPY (EGD) WITH PROPOFOL
Anesthesia: General

## 2022-09-15 MED ORDER — PHENYLEPHRINE 80 MCG/ML (10ML) SYRINGE FOR IV PUSH (FOR BLOOD PRESSURE SUPPORT)
PREFILLED_SYRINGE | INTRAVENOUS | Status: DC | PRN
  Administered 2022-09-15: 160 ug via INTRAVENOUS
  Administered 2022-09-15: 80 ug via INTRAVENOUS

## 2022-09-15 MED ORDER — PHENYLEPHRINE 80 MCG/ML (10ML) SYRINGE FOR IV PUSH (FOR BLOOD PRESSURE SUPPORT)
PREFILLED_SYRINGE | INTRAVENOUS | Status: AC
  Filled 2022-09-15: qty 10

## 2022-09-15 MED ORDER — LIDOCAINE HCL (CARDIAC) PF 100 MG/5ML IV SOSY
PREFILLED_SYRINGE | INTRAVENOUS | Status: DC | PRN
  Administered 2022-09-15: 50 mg via INTRAVENOUS

## 2022-09-15 MED ORDER — LACTATED RINGERS IV SOLN: INTRAVENOUS | Status: DC

## 2022-09-15 MED ORDER — DEXMEDETOMIDINE HCL IN NACL 80 MCG/20ML IV SOLN
INTRAVENOUS | Status: AC
  Filled 2022-09-15: qty 20

## 2022-09-15 MED ORDER — DEXMEDETOMIDINE HCL IN NACL 80 MCG/20ML IV SOLN
INTRAVENOUS | Status: DC | PRN
  Administered 2022-09-15: 8 ug via INTRAVENOUS

## 2022-09-15 MED ORDER — PROPOFOL 10 MG/ML IV BOLUS
INTRAVENOUS | Status: DC | PRN
Start: 2022-09-15 — End: 2022-09-15
  Administered 2022-09-15: 50 mg via INTRAVENOUS
  Administered 2022-09-15: 60 mg via INTRAVENOUS
  Administered 2022-09-15: 50 mg via INTRAVENOUS
  Administered 2022-09-15: 140 mg via INTRAVENOUS

## 2022-09-15 NOTE — Anesthesia Procedure Notes (Signed)
Date/Time: 09/15/2022 7:49 AM  Performed by: Julian Reil, CRNAPre-anesthesia Checklist: Patient identified, Emergency Drugs available, Suction available and Patient being monitored Oxygen Delivery Method: Nasal cannula Induction Type: IV induction Placement Confirmation: positive ETCO2 Comments: Optiflow [place] high O2 flow used

## 2022-09-15 NOTE — Transfer of Care (Signed)
Immediate Anesthesia Transfer of Care Note  Patient: Micheal Ross.  Procedure(s) Performed: ESOPHAGOGASTRODUODENOSCOPY (EGD) WITH PROPOFOL POLYPECTOMY BIOPSY  Patient Location: Short Stay  Anesthesia Type:General  Level of Consciousness: awake  Airway & Oxygen Therapy: Patient Spontanous Breathing  Post-op Assessment: Report given to RN and Post -op Vital signs reviewed and stable  Post vital signs: Reviewed and stable  Last Vitals:  Vitals Value Taken Time  BP    Temp    Pulse    Resp    SpO2      Last Pain:  Vitals:   09/15/22 0740  TempSrc:   PainSc: 0-No pain         Complications: No notable events documented.

## 2022-09-15 NOTE — Discharge Instructions (Addendum)
EGD Discharge instructions Please read the instructions outlined below and refer to this sheet in the next few weeks. These discharge instructions provide you with general information on caring for yourself after you leave the hospital. Your doctor may also give you specific instructions. While your treatment has been planned according to the most current medical practices available, unavoidable complications occasionally occur. If you have any problems or questions after discharge, please call your doctor. ACTIVITY You may resume your regular activity but move at a slower pace for the next 24 hours.  Take frequent rest periods for the next 24 hours.  Walking will help expel (get rid of) the air and reduce the bloated feeling in your abdomen.  No driving for 24 hours (because of the anesthesia (medicine) used during the test).  You may shower.  Do not sign any important legal documents or operate any machinery for 24 hours (because of the anesthesia used during the test).  NUTRITION Drink plenty of fluids.  You may resume your normal diet.  Begin with a light meal and progress to your normal diet.  Avoid alcoholic beverages for 24 hours or as instructed by your caregiver.  MEDICATIONS You may resume your normal medications unless your caregiver tells you otherwise.  WHAT YOU CAN EXPECT TODAY You may experience abdominal discomfort such as a feeling of fullness or "gas" pains.  FOLLOW-UP Your doctor will discuss the results of your test with you.  SEEK IMMEDIATE MEDICAL ATTENTION IF ANY OF THE FOLLOWING OCCUR: Excessive nausea (feeling sick to your stomach) and/or vomiting.  Severe abdominal pain and distention (swelling).  Trouble swallowing.  Temperature over 101 F (37.8 C).  Rectal bleeding or vomiting of blood.    Your EGD revealed mild amount inflammation in your stomach.  I took biopsies of this to rule out infection with a bacteria called H. pylori.    Previously noted ulcer  has healed.  You did have a small polyp in your stomach which I removed today.  Small bowel appeared normal.  Await pathology results, my office will contact you.  Follow-up in GI office in 3 months.  Continue on pantoprazole daily  I hope you have a great rest of your week!  Hennie Duos. Marletta Lor, D.O. Gastroenterology and Hepatology [facility]

## 2022-09-15 NOTE — H&P (Signed)
Primary Care Physician:  Kirstie Peri, MD Primary Gastroenterologist:  Dr. Marletta Lor  Pre-Procedure History & Physical: HPI:  Micheal Deharo. is a 62 y.o. male is here for an EGD to be performed for epigastric pain, peptic ulcer disease.   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)   Dizziness    Hypercholesterolemia    Hypertension    Neck pain    Shoulder pain    Sleep apnea    uses bipap at home    Past Surgical History:  Procedure Laterality Date   ANKLE SURGERY     BIOPSY  04/21/2022   Procedure: BIOPSY;  Surgeon: Lanelle Bal, DO;  [facility];  Service: Endoscopy;;   brain tumor removal  2014   CARPAL TUNNEL RELEASE Left 08/15/2021   Procedure: LEFT CARPAL TUNNEL RELEASE;  Surgeon: Eldred Manges, MD;  Location: [facility];  Service: Orthopedics;  Laterality: Left;   COLONOSCOPY WITH PROPOFOL N/A 01/13/2019   Procedure: COLONOSCOPY WITH PROPOFOL;  Surgeon: Corbin Ade, MD;  20 mm polyp found in the cecum that was removed via piecemeal hot snare polypectomy. Repeat in 4 months. path tubulovillous adenoma, negative for high-grade dysplasia   COLONOSCOPY WITH PROPOFOL N/A 01/20/2019   Procedure: COLONOSCOPY WITH PROPOFOL;  Surgeon: West Bali, MD; large defect (2 cm x 1.5 cm with small visible vessel with stigmata of recent bleed) in the cecum.  3 hemostatic clips placed (MR conditional).  One 3 mm polyp in the cecum, tortuous colon, external and internal hemorrhoids.  Pathology revealed tubular adenoma.   COLONOSCOPY WITH PROPOFOL N/A 10/06/2021   Procedure: COLONOSCOPY WITH PROPOFOL;  Surgeon: Lanelle Bal, DO;  [facility];  Service: Endoscopy;  Laterality: N/A;  7:30am   ESOPHAGOGASTRODUODENOSCOPY (EGD) WITH PROPOFOL N/A 04/21/2022   Procedure: ESOPHAGOGASTRODUODENOSCOPY (EGD) WITH PROPOFOL;  Surgeon: Lanelle Bal, DO;  [facility]: [facility];   Service: Endoscopy;  Laterality: N/A;  145pm, asa 3, pt can't come earlier - transportation   HERNIA REPAIR  2010   umbilical   NASAL SEPTOPLASTY W/ TURBINOPLASTY Bilateral 11/30/2020   Procedure: NASAL SEPTOPLASTY WITH BILATERAL  TURBINATE REDUCTION;  Surgeon: Newman Pies, MD;  Location: [facility];  Service: ENT;  Laterality: Bilateral;   POLYPECTOMY  01/13/2019   Procedure: POLYPECTOMY;  Surgeon: Corbin Ade, MD;  [facility];  Service: Endoscopy;;  Large Cecal Polyp    POLYPECTOMY  10/06/2021   Procedure: POLYPECTOMY;  Surgeon: Lanelle Bal, DO;  [facility]: [facility];  Service: Endoscopy;;   SINOSCOPY     ULNAR TUNNEL RELEASE Left 07/19/2022   Procedure: LEFT CUBITAL TUNNEL RELEASE;  Surgeon: Eldred Manges, MD;  Location: [facility];  Service: Orthopedics;  Laterality: Left;    Prior to Admission medications   Medication Sig Start Date End Date Taking? Authorizing Provider  amLODipine (NORVASC) 10 MG tablet Take 1 tablet (10 mg total) by mouth daily. 06/06/22  Yes Branch, Dorothe Pea, MD  Azelastine HCl (ASTEPRO) 0.15 % SOLN Place 1 spray into the nose daily in the afternoon. 07/07/22  Yes Coralyn Helling, MD  chlorthalidone (HYGROTON) 25 MG tablet Take 0.5 tablets (12.5 mg total) by mouth every other day. Patient taking differently: Take 25 mg by mouth every other day. 06/06/22  Yes Antoine Poche, MD  fluticasone (FLONASE) 50 MCG/ACT nasal spray SPRAY  2 SPRAYS INTO EACH NOSTRIL EVERY DAY 07/12/22  Yes Birder Robson, MD  lisinopril (ZESTRIL) 20 MG tablet Take 1 tablet (20 mg total) by mouth daily. 08/03/22  Yes BranchDorothe Pea, MD  Olopatadine HCl 0.2 % SOLN Apply 1 drop to eye daily as needed. Patient taking differently: Place 1 drop into both eyes daily. 01/16/22  Yes Birder Robson, MD  pantoprazole (PROTONIX) 40 MG tablet Take 1 tablet (40 mg total) by mouth daily before breakfast. 08/10/22  Yes Ermalinda Memos S, PA-C  pregabalin (LYRICA)  150 MG capsule Take 1 capsule (150 mg total) by mouth 2 (two) times daily. 08/01/22  Yes Camara, Amalia Hailey, MD  rosuvastatin (CRESTOR) 5 MG tablet Take 5 mg by mouth at bedtime. 07/15/21  Yes [provider]    Allergies as of 08/10/2022 - Review Complete 08/10/2022  Allergen Reaction Noted   Methocarbamol Other (See Comments) 10/12/2021    Family History  Problem Relation Age of Onset   Thyroid disease Mother    COPD Father    Cancer Maternal Aunt    Cancer Maternal Uncle        back   Cancer Paternal Uncle        throat    Cancer Cousin        prostate   Colon cancer Neg Hx    Colon polyps Neg Hx    Allergic rhinitis Neg Hx    Angioedema Neg Hx    Asthma Neg Hx    Eczema Neg Hx     Social History   Socioeconomic History   Marital status: Married    Spouse name: Not on file   Number of children: Not on file   Years of education: Not on file   Highest education level: Not on file  Occupational History   Not on file  Tobacco Use   Smoking status: Former    Current packs/day: 0.00    Average packs/day: 0.5 packs/day for 2.0 years (1.0 ttl pk-yrs)    Types: Cigarettes    Start date: 03/29/1981    Quit date: 03/30/1983    Years since quitting: 39.4    Passive exposure: Past   Smokeless tobacco: Never  Vaping Use   Vaping status: Never Used  Substance and Sexual Activity   Alcohol use: Not Currently    Comment: occ   Drug use: No   Sexual activity: Yes    Comment: married for 1985, 6 grown kids  Other Topics Concern   Not on file  Social History Narrative   Not on file   Social Determinants of Health   Financial Resource Strain: Not on file  Food Insecurity: Not on file  Transportation Needs: Not on file  Physical Activity: Not on file  Stress: Not on file  Social Connections: Not on file  Intimate Partner Violence: Not on file    Review of Systems: General: Negative for fever, chills, fatigue, weakness. Eyes: Negative for vision changes.  ENT:  Negative for hoarseness, difficulty swallowing , nasal congestion. CV: Negative for chest pain, angina, palpitations, dyspnea on exertion, peripheral edema.  Respiratory: Negative for dyspnea at rest, dyspnea on exertion, cough, sputum, wheezing.  GI: See history of present illness. GU:  Negative for dysuria, hematuria, urinary incontinence, urinary frequency, nocturnal urination.  MS: Negative for joint pain, low back pain.  Derm: Negative for rash or itching.  Neuro: Negative for weakness, abnormal sensation, seizure, frequent headaches, memory loss, confusion.  Psych: Negative for anxiety, depression  Endo: Negative for unusual weight change.  Heme: Negative for bruising or bleeding. Allergy: Negative for rash or hives.  Physical Exam: Vital signs in last 24 hours: Temp:  [98.6 F (37 C)] 98.6 F (37 C) (07/12 0717) Pulse Rate:  [93] 93 (07/12 0717) Resp:  [15] 15 (07/12 0717) BP: (127)/(88) 127/88 (07/12 0717) SpO2:  [99 %] 99 % (07/12 0717) Weight:  [140.6 kg] 140.6 kg (07/12 0717)   General:   Alert,  Well-developed, well-nourished, pleasant and cooperative in NAD Head:  Normocephalic and atraumatic. Eyes:  Sclera clear, no icterus.   Conjunctiva pink. Ears:  Normal auditory acuity. Nose:  No deformity, discharge,  or lesions. Msk:  Symmetrical without gross deformities. Normal posture. Extremities:  Without clubbing or edema. Neurologic:  Alert and  oriented x4;  grossly normal neurologically. Skin:  Intact without significant lesions or rashes. Psych:  Alert and cooperative. Normal mood and affect.   Impression/Plan: Micheal Ross. is here for an EGD to be performed for epigastric pain, peptic ulcer disease.   Risks, benefits, limitations, imponderables and alternatives regarding EGD have been reviewed with the patient. Questions have been answered. All parties agreeable.

## 2022-09-15 NOTE — Anesthesia Preprocedure Evaluation (Addendum)
Anesthesia Evaluation  Patient identified by MRN, date of birth, ID band Patient awake    Reviewed: Allergy & Precautions, H&P , NPO status , Patient's Chart, lab work & pertinent test results  Airway Mallampati: III  TM Distance: >3 FB Neck ROM: Full    Dental  (+) Missing, Dental Advisory Given   Pulmonary shortness of breath and with exertion, sleep apnea and Continuous Positive Airway Pressure Ventilation , former smoker   Pulmonary exam normal breath sounds clear to auscultation       Cardiovascular Exercise Tolerance: Good hypertension, Pt. on medications + DOE  Normal cardiovascular exam Rhythm:Regular Rate:Normal     Neuro/Psych Meningioma - removed in 2014  Neuromuscular disease  negative psych ROS   GI/Hepatic Neg liver ROS,GERD  Medicated and Poorly Controlled,,  Endo/Other    Morbid obesity  Renal/GU negative Renal ROS  negative genitourinary   Musculoskeletal negative musculoskeletal ROS (+)    Abdominal   Peds negative pediatric ROS (+)  Hematology  (+) Blood dyscrasia, anemia   Anesthesia Other Findings   Reproductive/Obstetrics negative OB ROS                             Anesthesia Physical Anesthesia Plan  ASA: 3  Anesthesia Plan: General   Post-op Pain Management: Minimal or no pain anticipated   Induction: Intravenous  PONV Risk Score and Plan: 1 and Propofol infusion  Airway Management Planned: Nasal Cannula, Natural Airway and Simple Face Mask  Additional Equipment:   Intra-op Plan:   Post-operative Plan:   Informed Consent: I have reviewed the patients History and Physical, chart, labs and discussed the procedure including the risks, benefits and alternatives for the proposed anesthesia with the patient or authorized representative who has indicated his/her understanding and acceptance.     Dental advisory given  Plan Discussed with: CRNA and  Surgeon  Anesthesia Plan Comments:        Anesthesia Quick Evaluation

## 2022-09-15 NOTE — Anesthesia Postprocedure Evaluation (Signed)
Anesthesia Post Note  Patient: Micheal Ross.  Procedure(s) Performed: ESOPHAGOGASTRODUODENOSCOPY (EGD) WITH PROPOFOL POLYPECTOMY BIOPSY  Patient location during evaluation: Phase II Anesthesia Type: General Level of consciousness: awake and alert and oriented Pain management: pain level controlled Vital Signs Assessment: post-procedure vital signs reviewed and stable Respiratory status: spontaneous breathing, nonlabored ventilation and respiratory function stable Cardiovascular status: blood pressure returned to baseline and stable Postop Assessment: no apparent nausea or vomiting Anesthetic complications: no  No notable events documented.   Last Vitals:  Vitals:   09/15/22 0717 09/15/22 0801  BP: 127/88 (!) 97/55  Pulse: 93 62  Resp: 15 16  Temp: 37 C 36.7 C  SpO2: 99% 96%    Last Pain:  Vitals:   09/15/22 0801  TempSrc: Oral  PainSc: 0-No pain                 Jaelynn Currier C Damichael Hofman

## 2022-09-15 NOTE — Op Note (Signed)
[facility] Patient Name: Micheal Ross Procedure Date: 09/15/2022 7:10 AM MRN: [id number] Date of Birth: [birth year] Attending MD: Hennie Duos. Marletta Lor , [place], [id number] CSN: [id number] Age: 62 Admit Type: Outpatient Procedure:                Upper GI endoscopy Indications:              Epigastric abdominal pain, Follow-up of peptic ulcer Providers:                Hennie Duos. Marletta Lor, DO, Sheran Fava, Zena Amos Referring MD:              Medicines:                See the Anesthesia note for documentation of the                            administered medications Complications:            No immediate complications. Estimated Blood Loss:     Estimated blood loss was minimal. Procedure:                Pre-Anesthesia Assessment:                           - The anesthesia plan was to use monitored                            anesthesia care (MAC).                           After obtaining informed consent, the endoscope was                            passed under direct vision. Throughout the                            procedure, the patient's blood pressure, pulse, and                            oxygen saturations were monitored continuously. The                            GIF-H190 ([id number]) scope was introduced through the                            mouth, and advanced to the second part of duodenum.                            The upper GI endoscopy was accomplished without                            difficulty. The patient tolerated the procedure                            well. Scope  In: 7:46:17 AM Scope Out: 7:53:01 AM Total Procedure Duration: 0 hours 6 minutes 44 seconds  Findings:      The Z-line was variable. Previoously biopsied (negative for Barrett's)      Patchy mild inflammation characterized by erythema was found in the       gastric body and in the gastric antrum. Biopsies were taken with a cold       forceps for Helicobacter  pylori testing.      A single 5 mm sessile polyp with no stigmata of recent bleeding was       found in the gastric antrum. The polyp was removed with a cold snare.       Resection and retrieval were complete.      The duodenal bulb, first portion of the duodenum and second portion of       the duodenum were normal. Impression:               - Z-line variable.                           - Gastritis. Biopsied.                           - A single gastric polyp. Resected and retrieved.                           - Normal duodenal bulb, first portion of the                            duodenum and second portion of the duodenum. Moderate Sedation:      Per Anesthesia Care Recommendation:           - Patient has a contact number available for                            emergencies. The signs and symptoms of potential                            delayed complications were discussed with the                            patient. Return to normal activities tomorrow.                            Written discharge instructions were provided to the                            patient.                           - Resume previous diet.                           - Continue present medications.                           - Await pathology results.                           -  Use Protonix (pantoprazole) 40 mg PO daily.                           - Return to GI office in 3 months. Procedure Code(s):        --- Professional ---                           [phone], Esophagogastroduodenoscopy, flexible,                            transoral; with removal of tumor(s), polyp(s), or                            other lesion(s) by snare technique                           43239, 59, Esophagogastroduodenoscopy, flexible,                            transoral; with biopsy, single or multiple Diagnosis Code(s):        --- Professional ---                           K22.89, Other specified disease of esophagus                            K29.70, Gastritis, unspecified, without bleeding                           K31.7, Polyp of stomach and duodenum                           R10.13, Epigastric pain                           K27.9, Peptic ulcer, site unspecified, unspecified                            as acute or chronic, without hemorrhage or                            perforation CPT copyright 2022 American Medical Association. All rights reserved. The codes documented in this report are preliminary and upon coder review may  be revised to meet current compliance requirements. Hennie Duos. Marletta Lor, DO Hennie Duos. Marletta Lor, DO 09/15/2022 8:00:05 AM This report has been signed electronically. Number of Addenda: 0

## 2022-09-18 DIAGNOSIS — S233XXA Sprain of ligaments of thoracic spine, initial encounter: Secondary | ICD-10-CM | POA: Diagnosis not present

## 2022-09-18 DIAGNOSIS — M47812 Spondylosis without myelopathy or radiculopathy, cervical region: Secondary | ICD-10-CM | POA: Diagnosis not present

## 2022-09-18 DIAGNOSIS — M47816 Spondylosis without myelopathy or radiculopathy, lumbar region: Secondary | ICD-10-CM | POA: Diagnosis not present

## 2022-09-18 DIAGNOSIS — M531 Cervicobrachial syndrome: Secondary | ICD-10-CM | POA: Diagnosis not present

## 2022-09-19 DIAGNOSIS — H2513 Age-related nuclear cataract, bilateral: Secondary | ICD-10-CM | POA: Diagnosis not present

## 2022-09-19 DIAGNOSIS — H40003 Preglaucoma, unspecified, bilateral: Secondary | ICD-10-CM | POA: Diagnosis not present

## 2022-09-19 LAB — SURGICAL PATHOLOGY

## 2022-09-20 ENCOUNTER — Encounter (HOSPITAL_COMMUNITY): Payer: Self-pay | Admitting: Internal Medicine

## 2022-09-21 ENCOUNTER — Encounter (INDEPENDENT_AMBULATORY_CARE_PROVIDER_SITE_OTHER): Payer: Self-pay

## 2022-09-21 DIAGNOSIS — M47812 Spondylosis without myelopathy or radiculopathy, cervical region: Secondary | ICD-10-CM | POA: Diagnosis not present

## 2022-09-21 DIAGNOSIS — M542 Cervicalgia: Secondary | ICD-10-CM | POA: Diagnosis not present

## 2022-09-21 DIAGNOSIS — S233XXA Sprain of ligaments of thoracic spine, initial encounter: Secondary | ICD-10-CM | POA: Diagnosis not present

## 2022-09-21 DIAGNOSIS — G8929 Other chronic pain: Secondary | ICD-10-CM | POA: Diagnosis not present

## 2022-09-21 DIAGNOSIS — M47816 Spondylosis without myelopathy or radiculopathy, lumbar region: Secondary | ICD-10-CM | POA: Diagnosis not present

## 2022-09-21 DIAGNOSIS — M549 Dorsalgia, unspecified: Secondary | ICD-10-CM | POA: Diagnosis not present

## 2022-09-21 DIAGNOSIS — M531 Cervicobrachial syndrome: Secondary | ICD-10-CM | POA: Diagnosis not present

## 2022-09-21 DIAGNOSIS — Z299 Encounter for prophylactic measures, unspecified: Secondary | ICD-10-CM | POA: Diagnosis not present

## 2022-09-21 DIAGNOSIS — I10 Essential (primary) hypertension: Secondary | ICD-10-CM | POA: Diagnosis not present

## 2022-09-25 ENCOUNTER — Telehealth: Payer: Self-pay | Admitting: Internal Medicine

## 2022-09-25 ENCOUNTER — Telehealth: Payer: Self-pay

## 2022-09-25 DIAGNOSIS — M47816 Spondylosis without myelopathy or radiculopathy, lumbar region: Secondary | ICD-10-CM | POA: Diagnosis not present

## 2022-09-25 DIAGNOSIS — M47812 Spondylosis without myelopathy or radiculopathy, cervical region: Secondary | ICD-10-CM | POA: Diagnosis not present

## 2022-09-25 DIAGNOSIS — R109 Unspecified abdominal pain: Secondary | ICD-10-CM

## 2022-09-25 DIAGNOSIS — M531 Cervicobrachial syndrome: Secondary | ICD-10-CM | POA: Diagnosis not present

## 2022-09-25 DIAGNOSIS — S233XXA Sprain of ligaments of thoracic spine, initial encounter: Secondary | ICD-10-CM | POA: Diagnosis not present

## 2022-09-25 NOTE — Telephone Encounter (Signed)
Patient left a message .... He said he was told by Dr. Marletta Lor that if he was still having problems that we would schedule him for an ultrasound.  He is wanting to be scheduled.

## 2022-09-25 NOTE — Telephone Encounter (Signed)
Pt stated that you and him had a conversation regarding a ultrasound for the pt. I didn't see where it was in a note from you. Pt states he is still having issues. Please advise regarding a ultrasound?

## 2022-09-25 NOTE — Telephone Encounter (Signed)
Mindy, please order RUQ [name], dx abdominal pain. Thank you

## 2022-09-25 NOTE — Telephone Encounter (Signed)
Already sent Dr Marletta Lor a note

## 2022-09-26 NOTE — Telephone Encounter (Signed)
noted 

## 2022-09-26 NOTE — Addendum Note (Signed)
Addended by: Armstead Peaks on: 09/26/2022 09:09 AM   Modules accepted: Orders

## 2022-09-26 NOTE — Telephone Encounter (Signed)
RUQ [name] scheduled for 8/2, arrival 915am, npo midnight.  Called pt, and he is aware of appt details.

## 2022-09-27 DIAGNOSIS — S233XXA Sprain of ligaments of thoracic spine, initial encounter: Secondary | ICD-10-CM | POA: Diagnosis not present

## 2022-09-27 DIAGNOSIS — M47816 Spondylosis without myelopathy or radiculopathy, lumbar region: Secondary | ICD-10-CM | POA: Diagnosis not present

## 2022-09-27 DIAGNOSIS — M531 Cervicobrachial syndrome: Secondary | ICD-10-CM | POA: Diagnosis not present

## 2022-09-27 DIAGNOSIS — M47812 Spondylosis without myelopathy or radiculopathy, cervical region: Secondary | ICD-10-CM | POA: Diagnosis not present

## 2022-10-02 DIAGNOSIS — M47812 Spondylosis without myelopathy or radiculopathy, cervical region: Secondary | ICD-10-CM | POA: Diagnosis not present

## 2022-10-02 DIAGNOSIS — M531 Cervicobrachial syndrome: Secondary | ICD-10-CM | POA: Diagnosis not present

## 2022-10-02 DIAGNOSIS — M47816 Spondylosis without myelopathy or radiculopathy, lumbar region: Secondary | ICD-10-CM | POA: Diagnosis not present

## 2022-10-02 DIAGNOSIS — S233XXA Sprain of ligaments of thoracic spine, initial encounter: Secondary | ICD-10-CM | POA: Diagnosis not present

## 2022-10-03 ENCOUNTER — Telehealth: Payer: Self-pay | Admitting: Internal Medicine

## 2022-10-03 NOTE — Telephone Encounter (Signed)
Patient called and asked for his last colonoscopy report with the codes on it.  Thurston Hole said you would get him to sign a release and then print for him with the codes at the end of the report.  I told the patient that I would check to see who could get this for him and we would let him know when it was ready.

## 2022-10-04 DIAGNOSIS — M47816 Spondylosis without myelopathy or radiculopathy, lumbar region: Secondary | ICD-10-CM | POA: Diagnosis not present

## 2022-10-04 DIAGNOSIS — M531 Cervicobrachial syndrome: Secondary | ICD-10-CM | POA: Diagnosis not present

## 2022-10-04 DIAGNOSIS — S233XXA Sprain of ligaments of thoracic spine, initial encounter: Secondary | ICD-10-CM | POA: Diagnosis not present

## 2022-10-04 DIAGNOSIS — M47812 Spondylosis without myelopathy or radiculopathy, cervical region: Secondary | ICD-10-CM | POA: Diagnosis not present

## 2022-10-04 NOTE — Telephone Encounter (Signed)
Pt called asking to speak with [facility] regarding what he spoke to you about yesterday. Aware you are in class this morning and he said he will call back this afternoon

## 2022-10-05 NOTE — Telephone Encounter (Signed)
Pt called back as he stated he never heard back from anyone. He just wanted his recent TCS reports. Printed out and left for pick up

## 2022-10-06 ENCOUNTER — Ambulatory Visit (HOSPITAL_COMMUNITY)
Admission: RE | Admit: 2022-10-06 | Discharge: 2022-10-06 | Disposition: A | Discharge status: Home / Self Care | Payer: 59 | Source: Ambulatory Visit | Attending: Internal Medicine | Admitting: Internal Medicine

## 2022-10-06 DIAGNOSIS — R109 Unspecified abdominal pain: Secondary | ICD-10-CM | POA: Diagnosis not present

## 2022-10-06 DIAGNOSIS — H2511 Age-related nuclear cataract, right eye: Secondary | ICD-10-CM | POA: Diagnosis not present

## 2022-10-09 ENCOUNTER — Encounter (HOSPITAL_COMMUNITY)
Admission: RE | Admit: 2022-10-09 | Discharge: 2022-10-09 | Disposition: A | Discharge status: Home / Self Care | Payer: 59 | Source: Ambulatory Visit | Attending: Optometry | Admitting: Optometry

## 2022-10-11 NOTE — H&P (Signed)
Surgical History & Physical  Patient Name: Micheal Ross  DOB: Jun 10, 1960  Surgery: Cataract extraction with intraocular lens implant phacoemulsification; Right Eye Surgeon: Pecolia Ades MD Surgery Date: 10/13/2022 Pre-Op Date: 09/19/2022  HPI: A 48 Yr. old male patient 1. The patient complains of difficulty when seeing street signs, which began 1.5 years ago. Both eyes are affected. The episode is constant. The condition's severity is worsening. 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 This is negatively affecting the patient's quality of life and the patient is unable to function adequately in life with the current level of vision. Seeing what looks like snowflakes in vision, unsure which eye x1 year. Almost always there. The brighter the light is, the worse it gets even in doors. He keeps his sunglasses on most all of the time because of this. Eyes get very red OU. He sees double, triple images at all times with or without glasses on. States present if he covers one eye vs the other, horizontal.  Medical History: Cataracts  High Blood Pressure LDL acid reflux  Review of Systems Cardiovascular High Blood Pressure Neurological vertigo All recorded systems are negative except as noted above.  Social Never smoked   Medication lisinopril ,  rosuvastatin ,  chlorthalidone ,  amlodipine ,  pregabalin ,  pantoprazole   Sx/Procedures Crainiotomy: tumor removed, benign, Sx on nose  Drug Allergies  methocarbamol ,  levonorgestrel-ethinyl estradiol    History & Physical: Heent: cataracts NECK: supple without bruits LUNGS: lungs clear to auscultation CV: regular rate and rhythm Abdomen: soft and non-tender  Impression & Plan: Assessment: 1.  CATARACT AGE-RELATED NUCLEAR; Both Eyes (H25.13) 2.  GLAUCOMA SUSPECT; Both Eyes (H40.003) 3.  Myopia ; Both Eyes (H52.13)  Plan: 1.  Cataracts are visually significant and account for the patient's complaints.  Discussed all risks, benefits, procedures and recovery, including infection, loss of vision and eye, need for glasses after surgery or additional procedures. Patient understands changing glasses will not improve vision. Patient indicated understanding of procedure. All questions answered. Patient desires to have surgery, recommend phacoemulsification with intraocular lens. Patient to have preliminary testing necessary ([employer]/IOL Master, Mac OCT, TOPO) Educational materials provided.  Plan: - Proceed with surgery OD, followed by OS (return for measurements prior to surgery) - DIB00 lens with best distance correction - ok with lying flat - no DM, no prior eye surgery  2.  Large Cups with some thinning inferiorly. IOP 20/21. Will complete work up after cataract extraction.  3.  Continue with current for now.

## 2022-10-13 ENCOUNTER — Ambulatory Visit (HOSPITAL_COMMUNITY): Payer: 59 | Admitting: Anesthesiology

## 2022-10-13 ENCOUNTER — Other Ambulatory Visit: Payer: Self-pay

## 2022-10-13 ENCOUNTER — Ambulatory Visit (HOSPITAL_BASED_OUTPATIENT_CLINIC_OR_DEPARTMENT_OTHER): Payer: 59 | Admitting: Anesthesiology

## 2022-10-13 ENCOUNTER — Encounter (HOSPITAL_COMMUNITY): Payer: Self-pay | Admitting: Optometry

## 2022-10-13 ENCOUNTER — Ambulatory Visit (HOSPITAL_COMMUNITY)
Admission: RE | Admit: 2022-10-13 | Discharge: 2022-10-13 | Disposition: A | Discharge status: Home / Self Care | Payer: 59 | Attending: Optometry | Admitting: Optometry

## 2022-10-13 ENCOUNTER — Encounter (HOSPITAL_COMMUNITY)
Admission: RE | Disposition: A | Discharge status: Home / Self Care | Payer: Self-pay | Source: Home / Self Care | Attending: Optometry

## 2022-10-13 DIAGNOSIS — K219 Gastro-esophageal reflux disease without esophagitis: Secondary | ICD-10-CM | POA: Diagnosis not present

## 2022-10-13 DIAGNOSIS — Z87891 Personal history of nicotine dependence: Secondary | ICD-10-CM | POA: Diagnosis not present

## 2022-10-13 DIAGNOSIS — H5213 Myopia, bilateral: Secondary | ICD-10-CM | POA: Diagnosis not present

## 2022-10-13 DIAGNOSIS — H40003 Preglaucoma, unspecified, bilateral: Secondary | ICD-10-CM | POA: Diagnosis not present

## 2022-10-13 DIAGNOSIS — H2511 Age-related nuclear cataract, right eye: Secondary | ICD-10-CM | POA: Insufficient documentation

## 2022-10-13 DIAGNOSIS — G4733 Obstructive sleep apnea (adult) (pediatric): Secondary | ICD-10-CM | POA: Diagnosis not present

## 2022-10-13 DIAGNOSIS — G4730 Sleep apnea, unspecified: Secondary | ICD-10-CM | POA: Diagnosis not present

## 2022-10-13 DIAGNOSIS — I10 Essential (primary) hypertension: Secondary | ICD-10-CM | POA: Diagnosis not present

## 2022-10-13 DIAGNOSIS — Z6841 Body Mass Index (BMI) 40.0 and over, adult: Secondary | ICD-10-CM | POA: Insufficient documentation

## 2022-10-13 HISTORY — PX: CATARACT EXTRACTION W/PHACO: SHX586

## 2022-10-13 SURGERY — PHACOEMULSIFICATION, CATARACT, WITH IOL INSERTION
Anesthesia: Monitor Anesthesia Care | Site: Eye | Laterality: Right

## 2022-10-13 MED ORDER — MIDAZOLAM HCL 2 MG/2ML IJ SOLN
INTRAMUSCULAR | Status: DC | PRN
  Administered 2022-10-13: 2 mg via INTRAVENOUS

## 2022-10-13 MED ORDER — LIDOCAINE HCL (PF) 1 % IJ SOLN
INTRAMUSCULAR | Status: AC
  Filled 2022-10-13: qty 2

## 2022-10-13 MED ORDER — LIDOCAINE HCL 3.5 % OP GEL
1.0000 | Freq: Once | OPHTHALMIC | Status: AC
  Administered 2022-10-13: 1 via OPHTHALMIC

## 2022-10-13 MED ORDER — STERILE WATER FOR IRRIGATION IR SOLN
Status: DC | PRN
  Administered 2022-10-13: 1

## 2022-10-13 MED ORDER — LIDOCAINE HCL (PF) 1 % IJ SOLN
INTRAMUSCULAR | Status: DC | PRN
  Administered 2022-10-13: 1 mL

## 2022-10-13 MED ORDER — TROPICAMIDE 1 % OP SOLN
1.0000 [drp] | OPHTHALMIC | Status: AC
  Administered 2022-10-13 (×3): 1 [drp] via OPHTHALMIC

## 2022-10-13 MED ORDER — NEOMYCIN-POLYMYXIN-DEXAMETH 3.5-10000-0.1 OP SUSP
OPHTHALMIC | Status: DC | PRN
  Administered 2022-10-13: 2 [drp] via OPHTHALMIC

## 2022-10-13 MED ORDER — BSS IO SOLN
INTRAOCULAR | Status: DC | PRN
  Administered 2022-10-13: 15 mL via INTRAOCULAR

## 2022-10-13 MED ORDER — SIGHTPATH DOSE#1 NA HYALUR & NA CHOND-NA HYALUR IO KIT
PACK | INTRAOCULAR | Status: DC | PRN
  Administered 2022-10-13: 1 via OPHTHALMIC

## 2022-10-13 MED ORDER — MIDAZOLAM HCL 2 MG/2ML IJ SOLN
INTRAMUSCULAR | Status: AC
  Filled 2022-10-13: qty 2

## 2022-10-13 MED ORDER — TETRACAINE HCL 0.5 % OP SOLN
1.0000 [drp] | OPHTHALMIC | Status: AC
  Administered 2022-10-13 (×3): 1 [drp] via OPHTHALMIC

## 2022-10-13 MED ORDER — FENTANYL CITRATE (PF) 100 MCG/2ML IJ SOLN
INTRAMUSCULAR | Status: AC
  Filled 2022-10-13: qty 2

## 2022-10-13 MED ORDER — POVIDONE-IODINE 5 % OP SOLN
OPHTHALMIC | Status: DC | PRN
  Administered 2022-10-13: 1 via OPHTHALMIC

## 2022-10-13 MED ORDER — PHENYLEPHRINE-KETOROLAC 1-0.3 % IO SOLN
INTRAOCULAR | Status: DC | PRN
  Administered 2022-10-13: 500 mL via OPHTHALMIC

## 2022-10-13 MED ORDER — FENTANYL CITRATE (PF) 100 MCG/2ML IJ SOLN
INTRAMUSCULAR | Status: DC | PRN
  Administered 2022-10-13: 50 ug via INTRAVENOUS

## 2022-10-13 MED ORDER — SODIUM CHLORIDE 0.9% FLUSH
INTRAVENOUS | Status: DC | PRN
  Administered 2022-10-13 (×2): 3 mL via INTRAVENOUS

## 2022-10-13 MED ORDER — PHENYLEPHRINE HCL 2.5 % OP SOLN
1.0000 [drp] | OPHTHALMIC | Status: AC
  Administered 2022-10-13 (×3): 1 [drp] via OPHTHALMIC

## 2022-10-13 SURGICAL SUPPLY — 14 items
CATARACT SUITE SIGHTPATH (MISCELLANEOUS) ×1
CLOTH BEACON ORANGE TIMEOUT ST (SAFETY) ×1 IMPLANT
DRSG TEGADERM 4X4.75 (GAUZE/BANDAGES/DRESSINGS) ×1 IMPLANT
EYE SHIELD UNIVERSAL CLEAR (GAUZE/BANDAGES/DRESSINGS) IMPLANT
FEE CATARACT SUITE SIGHTPATH (MISCELLANEOUS) ×1 IMPLANT
GLOVE BIOGEL PI IND STRL 7.0 (GLOVE) ×2 IMPLANT
LENS IOL TECNIS EYHANCE 18.0 (Intraocular Lens) IMPLANT
NDL HYPO 18GX1.5 BLUNT FILL (NEEDLE) ×1 IMPLANT
NEEDLE HYPO 18GX1.5 BLUNT FILL (NEEDLE) ×1
PAD ARMBOARD 7.5X6 YLW CONV (MISCELLANEOUS) ×1 IMPLANT
POSITIONER HEAD 8X9X4 ADT (SOFTGOODS) ×1 IMPLANT
SYR TB 1ML LL NO SAFETY (SYRINGE) ×1 IMPLANT
TAPE SURG TRANSPORE 1 IN (GAUZE/BANDAGES/DRESSINGS) IMPLANT
WATER STERILE IRR 250ML POUR (IV SOLUTION) ×1 IMPLANT

## 2022-10-13 NOTE — Anesthesia Postprocedure Evaluation (Signed)
Anesthesia Post Note  Patient: Micheal Ross.  Procedure(s) Performed: CATARACT EXTRACTION PHACO AND INTRAOCULAR LENS PLACEMENT (IOC) (Right: Eye)  Patient location during evaluation: Phase II Anesthesia Type: MAC Level of consciousness: awake and alert and oriented Pain management: pain level controlled Vital Signs Assessment: post-procedure vital signs reviewed and stable Respiratory status: spontaneous breathing, nonlabored ventilation and respiratory function stable Cardiovascular status: blood pressure returned to baseline and stable Postop Assessment: no apparent nausea or vomiting Anesthetic complications: no  No notable events documented.   Last Vitals:  Vitals:   10/13/22 0748 10/13/22 0940  BP: 119/74 113/81  Pulse: 86 85  Resp: 14 13  Temp: 36.8 C 36.7 C  SpO2: 100% 96%    Last Pain:  Vitals:   10/13/22 0940  TempSrc: Oral  PainSc: 0-No pain                  C 

## 2022-10-13 NOTE — Discharge Instructions (Signed)
Please discharge patient when stable, will follow up today with Dr. Snyder at the [facility] [place] office immediately following discharge.  Leave shield in place until visit.  All paperwork with discharge instructions will be given at the office.  [facility] Address:  [address]  [place], [place] [address]  Dr. Snyder's Phone: [phone]  

## 2022-10-13 NOTE — Transfer of Care (Signed)
Immediate Anesthesia Transfer of Care Note  Patient: Micheal Ross.  Procedure(s) Performed: CATARACT EXTRACTION PHACO AND INTRAOCULAR LENS PLACEMENT (IOC) (Right: Eye)  Patient Location: Short Stay  Anesthesia Type:MAC  Level of Consciousness: awake  Airway & Oxygen Therapy: Patient Spontanous Breathing  Post-op Assessment: Report given to RN and Post -op Vital signs reviewed and stable  Post vital signs: Reviewed and stable  Last Vitals:  Vitals Value Taken Time  BP 113/81 10/13/22 0940  Temp 36.7 C 10/13/22 0940  Pulse 88 10/13/22 0941  Resp 13 10/13/22 0941  SpO2 97 % 10/13/22 0941  Vitals shown include unfiled device data.  Last Pain:  Vitals:   10/13/22 0940  TempSrc: Oral  PainSc: 0-No pain         Complications: No notable events documented.

## 2022-10-13 NOTE — Interval H&P Note (Signed)
History and Physical Interval Note:  10/13/2022 8:02 AM  The H and P was reviewed and updated. The patient was examined.  No changes were found after exam.  The surgical eye was marked.   

## 2022-10-13 NOTE — Anesthesia Procedure Notes (Signed)
Date/Time: 10/13/2022 9:15 AM  Performed by: Franco Nones, CRNAPre-anesthesia Checklist: Patient identified, Emergency Drugs available, Suction available, Timeout performed and Patient being monitored Patient Re-evaluated:Patient Re-evaluated prior to induction Oxygen Delivery Method: Nasal Cannula

## 2022-10-13 NOTE — Anesthesia Preprocedure Evaluation (Signed)
Anesthesia Evaluation  Patient identified by MRN, date of birth, ID band Patient awake    Reviewed: Allergy & Precautions, H&P , NPO status , Patient's Chart, lab work & pertinent test results  Airway Mallampati: III  TM Distance: >3 FB Neck ROM: Full    Dental  (+) Dental Advisory Given, Missing   Pulmonary shortness of breath and with exertion, sleep apnea and Continuous Positive Airway Pressure Ventilation , former smoker   Pulmonary exam normal breath sounds clear to auscultation       Cardiovascular Exercise Tolerance: Good hypertension, Pt. on medications negative cardio ROS Normal cardiovascular exam Rhythm:Regular Rate:Normal     Neuro/Psych Brain tumor   Neuromuscular disease  negative psych ROS   GI/Hepatic Neg liver ROS,GERD  Medicated and Controlled,,  Endo/Other    Morbid obesity  Renal/GU negative Renal ROS  negative genitourinary   Musculoskeletal negative musculoskeletal ROS (+)    Abdominal   Peds negative pediatric ROS (+)  Hematology  (+) Blood dyscrasia, anemia   Anesthesia Other Findings   Reproductive/Obstetrics negative OB ROS                             Anesthesia Physical Anesthesia Plan  ASA: 3  Anesthesia Plan: MAC   Post-op Pain Management: Minimal or no pain anticipated   Induction: Intravenous  PONV Risk Score and Plan: Treatment may vary due to age or medical condition  Airway Management Planned: Nasal Cannula and Natural Airway  Additional Equipment:   Intra-op Plan:   Post-operative Plan:   Informed Consent: I have reviewed the patients History and Physical, chart, labs and discussed the procedure including the risks, benefits and alternatives for the proposed anesthesia with the patient or authorized representative who has indicated his/her understanding and acceptance.     Dental advisory given  Plan Discussed with: CRNA and  Surgeon  Anesthesia Plan Comments:        Anesthesia Quick Evaluation

## 2022-10-13 NOTE — Op Note (Signed)
Date of procedure: 10/13/22  Pre-operative diagnosis: Visually significant age-related nuclear cataract, Right Eye (H25.11)  Post-operative diagnosis: Visually significant age-related nuclear cataract, Right Eye  Procedure: Removal of cataract via phacoemulsification and insertion of intra-ocular lens J&J DIBOO +18.0D into the capsular bag of the Right Eye  Attending surgeon: Pecolia Ades, MD  Anesthesia: MAC, Topical Akten  Complications: None  Estimated Blood Loss: <84mL (minimal)  Specimens: None  Implants:  Implant Name Type Inv. Item Serial No. Manufacturer Lot No. LRB No. Used Action  LENS TECNIS EYHANCE IOL Intraocular Lens  [id number] SIGHTPATH  Right 1 Implanted    Indications:  Visually significant age-related cataract, Right Eye  Procedure:  The patient was seen and identified in the pre-operative area. The operative eye was identified and dilated.  The operative eye was marked.  Topical anesthesia was administered to the operative eye.     The patient was then to the operative suite and placed in the supine position.  A timeout was performed confirming the patient, procedure to be performed, and all other relevant information.   The patient's face was prepped and draped in the usual fashion for intra-ocular surgery.  A lid speculum was placed into the operative eye and the surgical microscope moved into place and focused.  A superotemporal paracentesis was created using a 20 gauge paracentesis blade.  BSS mixed with Omidria, followed by 1% lidocaine was injected into the anterior chamber.  Viscoelastic was injected into the anterior chamber.  A temporal clear-corneal main wound incision was created using a 2.8mm microkeratome.  A continuous curvilinear capsulorrhexis was initiated using an irrigating cystitome and completed using capsulorrhexis forceps.  Hydrodissection and hydrodeliniation were performed.  Viscoelastic was injected into the anterior chamber.  A  phacoemulsification handpiece and a chopper as a second instrument were used to remove the nucleus and epinucleus. The irrigation/aspiration handpiece was used to remove any remaining cortical material.   The capsular bag was reinflated with viscoelastic, checked, and found to be intact.  The intraocular lens was inserted into the capsular bag.  The irrigation/aspiration handpiece was used to remove any remaining viscoelastic.  The clear corneal wound and paracentesis wounds were then hydrated and checked with Weck-Cels to be watertight.  The lid-speculum and drape was removed, and the patient's face was cleaned with a wet and dry 4x4.  Maxitrol drops were instilled onto the eye. A clear shield was taped over the eye. The patient was taken to the post-operative care unit in good condition, having tolerated the procedure well.  Post-Op Instructions: The patient will follow up at [facility] for a same day post-operative evaluation and will receive all other orders and instructions.

## 2022-10-17 ENCOUNTER — Encounter (HOSPITAL_COMMUNITY): Payer: Self-pay | Admitting: Optometry

## 2022-10-20 DIAGNOSIS — H2512 Age-related nuclear cataract, left eye: Secondary | ICD-10-CM | POA: Diagnosis not present

## 2022-10-20 NOTE — H&P (Signed)
Surgical History & Physical  Patient Name: Micheal Ross  DOB: [birth year]  Surgery: Cataract extraction with intraocular lens implant phacoemulsification; Left Eye Surgeon: Pecolia Ades MD Surgery Date: 10/27/2022 Pre-Op Date: 10/18/2022  HPI: A 75 Yr. old male patient present for 5 day post op OD. Very happy with results OD. He is ready to proceed with OS. Using Ciprofloxacin QID and Prednisolone QID OD. Difficulties reading fine print, watching TV, recognizing people from a distance, all activities of daily living OS. Glare problems at night and during the day. This is negatively affecting the patient's quality of life and the patient is unable to function adequately in life with the current level of vision.  Medical History: Cataracts  High Blood Pressure LDL acid reflux  Review of Systems Cardiovascular High Blood Pressure Neurological vertigo All recorded systems are negative except as noted above.  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  INTRAOCULAR LENS IOL (Z96.1) 3.  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  POD#5 Continue with eye drops as directed. Reviewed all post-op precautions. Call with increased redness, swelling, pain or loss of vision. Avoid swimming pools and hot tubs for 1 week.  2.  Doing well since surgery  3.  Cataracts are visually significant and account for the patient's complaints. Discussed all risks, benefits,  procedures and recovery, including infection, loss of vision and eye, need for glasses after surgery or additional procedures. Patient understands changing glasses will not improve vision. Patient indicated understanding of procedure. All questions answered. Patient desires to have surgery, recommend phacoemulsification with intraocular lens. Patient to have preliminary testing necessary ([employer]/IOL Master, Mac OCT, TOPO) Educational materials provided.  Plan: - Proceed with surgery OS when ready - DIB00 lens with best distance correction - ok with lying flat - no DM, no prior eye surgery

## 2022-10-22 ENCOUNTER — Encounter: Payer: Self-pay | Admitting: Cardiology

## 2022-10-23 DIAGNOSIS — M47812 Spondylosis without myelopathy or radiculopathy, cervical region: Secondary | ICD-10-CM | POA: Diagnosis not present

## 2022-10-23 DIAGNOSIS — M531 Cervicobrachial syndrome: Secondary | ICD-10-CM | POA: Diagnosis not present

## 2022-10-23 DIAGNOSIS — S233XXA Sprain of ligaments of thoracic spine, initial encounter: Secondary | ICD-10-CM | POA: Diagnosis not present

## 2022-10-23 DIAGNOSIS — M47816 Spondylosis without myelopathy or radiculopathy, lumbar region: Secondary | ICD-10-CM | POA: Diagnosis not present

## 2022-10-24 NOTE — Telephone Encounter (Signed)
BP and HRs look fine, no changes in meds needed at this time  Dominga Ferry MD

## 2022-10-25 ENCOUNTER — Ambulatory Visit: Payer: 59 | Admitting: Neurology

## 2022-10-25 ENCOUNTER — Encounter (HOSPITAL_COMMUNITY)
Admission: RE | Admit: 2022-10-25 | Discharge: 2022-10-25 | Disposition: A | Discharge status: Home / Self Care | Payer: 59 | Source: Ambulatory Visit | Attending: Optometry | Admitting: Optometry

## 2022-10-26 DIAGNOSIS — S233XXA Sprain of ligaments of thoracic spine, initial encounter: Secondary | ICD-10-CM | POA: Diagnosis not present

## 2022-10-26 DIAGNOSIS — M531 Cervicobrachial syndrome: Secondary | ICD-10-CM | POA: Diagnosis not present

## 2022-10-26 DIAGNOSIS — M47816 Spondylosis without myelopathy or radiculopathy, lumbar region: Secondary | ICD-10-CM | POA: Diagnosis not present

## 2022-10-26 DIAGNOSIS — M47812 Spondylosis without myelopathy or radiculopathy, cervical region: Secondary | ICD-10-CM | POA: Diagnosis not present

## 2022-10-27 ENCOUNTER — Encounter (HOSPITAL_COMMUNITY): Payer: Self-pay | Admitting: Optometry

## 2022-10-27 ENCOUNTER — Ambulatory Visit (HOSPITAL_COMMUNITY): Payer: 59 | Admitting: Certified Registered"

## 2022-10-27 ENCOUNTER — Other Ambulatory Visit: Payer: Self-pay

## 2022-10-27 ENCOUNTER — Ambulatory Visit (HOSPITAL_BASED_OUTPATIENT_CLINIC_OR_DEPARTMENT_OTHER): Payer: 59 | Admitting: Certified Registered"

## 2022-10-27 ENCOUNTER — Ambulatory Visit (HOSPITAL_COMMUNITY)
Admission: RE | Admit: 2022-10-27 | Discharge: 2022-10-27 | Disposition: A | Discharge status: Home / Self Care | Payer: 59 | Attending: Optometry | Admitting: Optometry

## 2022-10-27 ENCOUNTER — Encounter (HOSPITAL_COMMUNITY)
Admission: RE | Disposition: A | Discharge status: Home / Self Care | Payer: Self-pay | Source: Home / Self Care | Attending: Optometry

## 2022-10-27 DIAGNOSIS — Z961 Presence of intraocular lens: Secondary | ICD-10-CM | POA: Insufficient documentation

## 2022-10-27 DIAGNOSIS — H2512 Age-related nuclear cataract, left eye: Secondary | ICD-10-CM | POA: Insufficient documentation

## 2022-10-27 DIAGNOSIS — G4730 Sleep apnea, unspecified: Secondary | ICD-10-CM | POA: Diagnosis not present

## 2022-10-27 DIAGNOSIS — I10 Essential (primary) hypertension: Secondary | ICD-10-CM | POA: Insufficient documentation

## 2022-10-27 DIAGNOSIS — E785 Hyperlipidemia, unspecified: Secondary | ICD-10-CM | POA: Diagnosis not present

## 2022-10-27 DIAGNOSIS — Z87891 Personal history of nicotine dependence: Secondary | ICD-10-CM | POA: Diagnosis not present

## 2022-10-27 DIAGNOSIS — Z9841 Cataract extraction status, right eye: Secondary | ICD-10-CM | POA: Diagnosis not present

## 2022-10-27 DIAGNOSIS — K219 Gastro-esophageal reflux disease without esophagitis: Secondary | ICD-10-CM | POA: Insufficient documentation

## 2022-10-27 DIAGNOSIS — Z6841 Body Mass Index (BMI) 40.0 and over, adult: Secondary | ICD-10-CM | POA: Diagnosis not present

## 2022-10-27 HISTORY — PX: CATARACT EXTRACTION W/PHACO: SHX586

## 2022-10-27 SURGERY — PHACOEMULSIFICATION, CATARACT, WITH IOL INSERTION
Anesthesia: Monitor Anesthesia Care | Site: Eye | Laterality: Left

## 2022-10-27 MED ORDER — TETRACAINE HCL 0.5 % OP SOLN
1.0000 [drp] | OPHTHALMIC | Status: AC | PRN
  Administered 2022-10-27 (×3): 1 [drp] via OPHTHALMIC

## 2022-10-27 MED ORDER — MIDAZOLAM HCL 2 MG/2ML IJ SOLN
INTRAMUSCULAR | Status: AC
  Filled 2022-10-27: qty 2

## 2022-10-27 MED ORDER — SODIUM CHLORIDE 0.9% FLUSH
INTRAVENOUS | Status: DC | PRN
Start: 2022-10-27 — End: 2022-10-27
  Administered 2022-10-27: 10 mL via INTRAVENOUS

## 2022-10-27 MED ORDER — FENTANYL CITRATE (PF) 100 MCG/2ML IJ SOLN
INTRAMUSCULAR | Status: DC | PRN
  Administered 2022-10-27: 100 ug via INTRAVENOUS

## 2022-10-27 MED ORDER — LIDOCAINE HCL 3.5 % OP GEL
1.0000 | Freq: Once | OPHTHALMIC | Status: AC
  Administered 2022-10-27: 1 via OPHTHALMIC

## 2022-10-27 MED ORDER — PHENYLEPHRINE-KETOROLAC 1-0.3 % IO SOLN
INTRAOCULAR | Status: DC | PRN
  Administered 2022-10-27: 500 mL via OPHTHALMIC

## 2022-10-27 MED ORDER — BSS IO SOLN
INTRAOCULAR | Status: DC | PRN
  Administered 2022-10-27: 15 mL via INTRAOCULAR

## 2022-10-27 MED ORDER — PHENYLEPHRINE HCL 2.5 % OP SOLN
1.0000 [drp] | OPHTHALMIC | Status: AC | PRN
  Administered 2022-10-27 (×3): 1 [drp] via OPHTHALMIC

## 2022-10-27 MED ORDER — LIDOCAINE HCL (PF) 1 % IJ SOLN
INTRAMUSCULAR | Status: DC | PRN
  Administered 2022-10-27: 1 mL

## 2022-10-27 MED ORDER — MIDAZOLAM HCL 2 MG/2ML IJ SOLN
INTRAMUSCULAR | Status: DC | PRN
  Administered 2022-10-27: 2 mg via INTRAVENOUS

## 2022-10-27 MED ORDER — POVIDONE-IODINE 5 % OP SOLN
OPHTHALMIC | Status: DC | PRN
  Administered 2022-10-27: 1 via OPHTHALMIC

## 2022-10-27 MED ORDER — FENTANYL CITRATE (PF) 100 MCG/2ML IJ SOLN
INTRAMUSCULAR | Status: AC
  Filled 2022-10-27: qty 2

## 2022-10-27 MED ORDER — SIGHTPATH DOSE#1 NA HYALUR & NA CHOND-NA HYALUR IO KIT
PACK | INTRAOCULAR | Status: DC | PRN
  Administered 2022-10-27: 1 via OPHTHALMIC

## 2022-10-27 MED ORDER — STERILE WATER FOR IRRIGATION IR SOLN
Status: DC | PRN
  Administered 2022-10-27: 1

## 2022-10-27 MED ORDER — TROPICAMIDE 1 % OP SOLN
1.0000 [drp] | OPHTHALMIC | Status: AC | PRN
  Administered 2022-10-27 (×3): 1 [drp] via OPHTHALMIC

## 2022-10-27 MED ORDER — MOXIFLOXACIN 0.1% OPHTHALMIC SOLUTION (0.1ML)
INJECTION | OPHTHALMIC | Status: DC | PRN
  Administered 2022-10-27: 1 mL via OPHTHALMIC

## 2022-10-27 SURGICAL SUPPLY — 14 items
CATARACT SUITE SIGHTPATH (MISCELLANEOUS) ×1
CLOTH BEACON ORANGE TIMEOUT ST (SAFETY) ×1 IMPLANT
DRSG TEGADERM 4X4.75 (GAUZE/BANDAGES/DRESSINGS) ×1 IMPLANT
EYE SHIELD UNIVERSAL CLEAR (GAUZE/BANDAGES/DRESSINGS) IMPLANT
FEE CATARACT SUITE SIGHTPATH (MISCELLANEOUS) ×1 IMPLANT
GLOVE BIOGEL PI IND STRL 7.0 (GLOVE) ×2 IMPLANT
LENS IOL TECNIS EYHANCE 17.0 (Intraocular Lens) IMPLANT
NDL HYPO 18GX1.5 BLUNT FILL (NEEDLE) ×1 IMPLANT
NEEDLE HYPO 18GX1.5 BLUNT FILL (NEEDLE) ×1
PAD ARMBOARD 7.5X6 YLW CONV (MISCELLANEOUS) ×1 IMPLANT
POSITIONER HEAD 8X9X4 ADT (SOFTGOODS) ×1 IMPLANT
SYR TB 1ML LL NO SAFETY (SYRINGE) ×1 IMPLANT
TAPE SURG TRANSPORE 1 IN (GAUZE/BANDAGES/DRESSINGS) IMPLANT
WATER STERILE IRR 250ML POUR (IV SOLUTION) ×1 IMPLANT

## 2022-10-27 NOTE — Discharge Instructions (Signed)
Please discharge patient when stable, will follow up today with Dr. Snyder at the [facility] [place] office immediately following discharge.  Leave shield in place until visit.  All paperwork with discharge instructions will be given at the office.  [facility] Address:  [address]  [place], [place] [address]  Dr. Snyder's Phone: [phone]  

## 2022-10-27 NOTE — Interval H&P Note (Signed)
History and Physical Interval Note:  10/27/2022 10:18 AM  The H and P was reviewed and updated. The patient was examined.  No changes were found after exam.  The surgical eye was marked.  Micheal Ross

## 2022-10-27 NOTE — Anesthesia Preprocedure Evaluation (Signed)
 Anesthesia Evaluation  Patient identified by MRN, date of birth, ID band Patient awake    Reviewed: Allergy & Precautions, H&P , NPO status , Patient's Chart, lab work & pertinent test results  Airway Mallampati: III  TM Distance: >3 FB Neck ROM: Full    Dental  (+) Dental Advisory Given, Missing   Pulmonary shortness of breath and with exertion, sleep apnea and Continuous Positive Airway Pressure Ventilation , former smoker   Pulmonary exam normal breath sounds clear to auscultation       Cardiovascular Exercise Tolerance: Good hypertension, Pt. on medications negative cardio ROS Normal cardiovascular exam Rhythm:Regular Rate:Normal     Neuro/Psych Brain tumor   Neuromuscular disease  negative psych ROS   GI/Hepatic Neg liver ROS,GERD  Medicated and Controlled,,  Endo/Other    Morbid obesity  Renal/GU negative Renal ROS  negative genitourinary   Musculoskeletal negative musculoskeletal ROS (+)    Abdominal   Peds negative pediatric ROS (+)  Hematology  (+) Blood dyscrasia, anemia   Anesthesia Other Findings   Reproductive/Obstetrics negative OB ROS                             Anesthesia Physical Anesthesia Plan  ASA: 3  Anesthesia Plan: MAC   Post-op Pain Management: Minimal or no pain anticipated   Induction: Intravenous  PONV Risk Score and Plan: Treatment may vary due to age or medical condition  Airway Management Planned: Nasal Cannula and Natural Airway  Additional Equipment:   Intra-op Plan:   Post-operative Plan:   Informed Consent: I have reviewed the patients History and Physical, chart, labs and discussed the procedure including the risks, benefits and alternatives for the proposed anesthesia with the patient or authorized representative who has indicated his/her understanding and acceptance.     Dental advisory given  Plan Discussed with: CRNA and  Surgeon  Anesthesia Plan Comments:        Anesthesia Quick Evaluation

## 2022-10-27 NOTE — Op Note (Signed)
Date of procedure: 10/27/22  Pre-operative diagnosis: Visually significant age-related nuclear cataract, Left Eye (H25.12)  Post-operative diagnosis: Visually significant age-related nuclear cataract, Left Eye  Procedure: Removal of cataract via phacoemulsification and insertion of intra-ocular lens J&J DIB00 +17.0D into the capsular bag of the Left Eye  Attending surgeon: Ronal Fear, MD  Anesthesia: MAC, Topical Akten  Complications: None  Estimated Blood Loss: <87mL (minimal)  Specimens: None  Implants:  Implant Name Type Inv. Item Serial No. Manufacturer Lot No. LRB No. Used Action  LENS IOL TECNIS EYHANCE 17.0 - [id number] Intraocular Lens LENS IOL TECNIS EYHANCE 17.0 [id number] SIGHTPATH  Left 1 Implanted    Indications:  Visually significant age-related cataract, Left Eye  Procedure:  The patient was seen and identified in the pre-operative area. The operative eye was identified and dilated.  The operative eye was marked.  Topical anesthesia was administered to the operative eye.     The patient was then to the operative suite and placed in the supine position.  A timeout was performed confirming the patient, procedure to be performed, and all other relevant information.   The patient's face was prepped and draped in the usual fashion for intra-ocular surgery.  A lid speculum was placed into the operative eye and the surgical microscope moved into place and focused.  An inferotemporal paracentesis was created using a 20 gauge paracentesis blade.  BSS mixed with Omidria, followed by 1% lidocaine was injected into the anterior chamber.  Viscoelastic was injected into the anterior chamber.  A temporal clear-corneal main wound incision was created using a 2.83mm microkeratome.  A continuous curvilinear capsulorrhexis was initiated using an irrigating cystitome and completed using capsulorrhexis forceps.  Hydrodissection and hydrodeliniation were performed.  Viscoelastic was  injected into the anterior chamber.  A phacoemulsification handpiece and a chopper as a second instrument were used to remove the nucleus and epinucleus. The irrigation/aspiration handpiece was used to remove any remaining cortical material.   The capsular bag was reinflated with viscoelastic, checked, and found to be intact.  The intraocular lens was inserted into the capsular bag.  The irrigation/aspiration handpiece was used to remove any remaining viscoelastic.  The clear corneal wound and paracentesis wounds were then hydrated and checked with Weck-Cels to be watertight.  The lid-speculum and drape was removed, and the patient's face was cleaned with a wet and dry 4x4.  Maxitrol drops were instilled onto the eye. A clear shield was taped over the eye. The patient was taken to the post-operative care unit in good condition, having tolerated the procedure well.  Post-Op Instructions: The patient will follow up at [facility] for a same day post-operative evaluation and will receive all other orders and instructions.

## 2022-10-27 NOTE — Transfer of Care (Signed)
Immediate Anesthesia Transfer of Care Note  Patient: Micheal Ross.  Procedure(s) Performed: CATARACT EXTRACTION PHACO AND INTRAOCULAR LENS PLACEMENT (IOC) (Left: Eye)  Patient Location: Short Stay  Anesthesia Type:MAC  Level of Consciousness: awake, alert , and patient cooperative  Airway & Oxygen Therapy: Patient Spontanous Breathing  Post-op Assessment: Report given to RN and Post -op Vital signs reviewed and stable  Post vital signs: Reviewed and stable  Last Vitals:  Vitals Value Taken Time  BP 103/70 10/27/22 1119  Temp 37.1 C 10/27/22 1119  Pulse 79 10/27/22 1119  Resp 18 10/27/22 1119  SpO2 99 % 10/27/22 1119    Last Pain:  Vitals:   10/27/22 1119  TempSrc: Oral  PainSc: 0-No pain      Patients Stated Pain Goal: 5 (10/27/22 1119)  Complications: No notable events documented.

## 2022-10-28 NOTE — Anesthesia Postprocedure Evaluation (Signed)
Anesthesia Post Note  Patient: Micheal Ross.  Procedure(s) Performed: CATARACT EXTRACTION PHACO AND INTRAOCULAR LENS PLACEMENT (IOC) (Left: Eye)  Patient location during evaluation: Phase II Anesthesia Type: MAC Level of consciousness: awake Pain management: pain level controlled Vital Signs Assessment: post-procedure vital signs reviewed and stable Respiratory status: spontaneous breathing and respiratory function stable Cardiovascular status: blood pressure returned to baseline and stable Postop Assessment: no headache and no apparent nausea or vomiting Anesthetic complications: no Comments: Late entry   No notable events documented.   Last Vitals:  Vitals:   10/27/22 0950 10/27/22 1119  BP: 106/71 103/70  Pulse: 80 79  Resp: 20 18  Temp: 36.6 C 37.1 C  SpO2: 98% 99%    Last Pain:  Vitals:   10/27/22 1119  TempSrc: Oral  PainSc: 0-No pain                 Windell Norfolk

## 2022-10-30 ENCOUNTER — Encounter (HOSPITAL_COMMUNITY): Payer: Self-pay | Admitting: Optometry

## 2022-11-07 DIAGNOSIS — M47816 Spondylosis without myelopathy or radiculopathy, lumbar region: Secondary | ICD-10-CM | POA: Diagnosis not present

## 2022-11-07 DIAGNOSIS — S233XXA Sprain of ligaments of thoracic spine, initial encounter: Secondary | ICD-10-CM | POA: Diagnosis not present

## 2022-11-07 DIAGNOSIS — M47812 Spondylosis without myelopathy or radiculopathy, cervical region: Secondary | ICD-10-CM | POA: Diagnosis not present

## 2022-11-07 DIAGNOSIS — M531 Cervicobrachial syndrome: Secondary | ICD-10-CM | POA: Diagnosis not present

## 2022-11-09 DIAGNOSIS — M47816 Spondylosis without myelopathy or radiculopathy, lumbar region: Secondary | ICD-10-CM | POA: Diagnosis not present

## 2022-11-09 DIAGNOSIS — M531 Cervicobrachial syndrome: Secondary | ICD-10-CM | POA: Diagnosis not present

## 2022-11-09 DIAGNOSIS — M47812 Spondylosis without myelopathy or radiculopathy, cervical region: Secondary | ICD-10-CM | POA: Diagnosis not present

## 2022-11-09 DIAGNOSIS — S233XXA Sprain of ligaments of thoracic spine, initial encounter: Secondary | ICD-10-CM | POA: Diagnosis not present

## 2022-11-13 DIAGNOSIS — G4733 Obstructive sleep apnea (adult) (pediatric): Secondary | ICD-10-CM | POA: Diagnosis not present

## 2022-11-13 MED ORDER — CHLORTHALIDONE 25 MG PO TABS: 25.0000 mg | ORAL_TABLET | ORAL | 1 refills | Status: DC

## 2022-11-14 DIAGNOSIS — M47812 Spondylosis without myelopathy or radiculopathy, cervical region: Secondary | ICD-10-CM | POA: Diagnosis not present

## 2022-11-14 DIAGNOSIS — M531 Cervicobrachial syndrome: Secondary | ICD-10-CM | POA: Diagnosis not present

## 2022-11-14 DIAGNOSIS — M47816 Spondylosis without myelopathy or radiculopathy, lumbar region: Secondary | ICD-10-CM | POA: Diagnosis not present

## 2022-11-14 DIAGNOSIS — S233XXA Sprain of ligaments of thoracic spine, initial encounter: Secondary | ICD-10-CM | POA: Diagnosis not present

## 2022-11-16 DIAGNOSIS — M47812 Spondylosis without myelopathy or radiculopathy, cervical region: Secondary | ICD-10-CM | POA: Diagnosis not present

## 2022-11-16 DIAGNOSIS — M47816 Spondylosis without myelopathy or radiculopathy, lumbar region: Secondary | ICD-10-CM | POA: Diagnosis not present

## 2022-11-16 DIAGNOSIS — S233XXA Sprain of ligaments of thoracic spine, initial encounter: Secondary | ICD-10-CM | POA: Diagnosis not present

## 2022-11-16 DIAGNOSIS — M531 Cervicobrachial syndrome: Secondary | ICD-10-CM | POA: Diagnosis not present

## 2022-11-20 ENCOUNTER — Encounter: Payer: Self-pay | Admitting: Cardiology

## 2022-11-20 ENCOUNTER — Ambulatory Visit: Payer: 59 | Attending: Neurology | Admitting: Cardiology

## 2022-11-20 VITALS — BP 128/78 | HR 87 | Ht 70.0 in | Wt 312.8 lb

## 2022-11-20 DIAGNOSIS — I10 Essential (primary) hypertension: Secondary | ICD-10-CM | POA: Diagnosis not present

## 2022-11-20 DIAGNOSIS — E782 Mixed hyperlipidemia: Secondary | ICD-10-CM | POA: Diagnosis not present

## 2022-11-20 NOTE — Patient Instructions (Addendum)
Medication Instructions:  Continue all current medications.  Labwork: none  Testing/Procedures: none  Follow-Up: 6 months   Any Other Special Instructions Will Be Listed Below (If Applicable).  If you need a refill on your cardiac medications before your next appointment, please call your pharmacy.  

## 2022-11-20 NOTE — Progress Notes (Signed)
Clinical Summary Mr. Virola is a 62 y.o.male seen today for follow up of the following medical problems.    1. HTN - has had few different ER visitis with HTN - 160/107, 163/113 prior to recent med changes - per pcp now he is on lisinopril 40, norvasc 5mg  daily, clonidine 0.1mg  bid.  - home bp's have been 120s/70s - weight is up  293 -->314 since I saw in 06/2021    - home bp's 117-125/70-80 - taking chlorthalidone about once a week as bp's get low when takes regularly - reports chronic fatigue, perhaps related to clonidine.     - home bp's 102-120s/60s-70s - 07/2022 K 3.8 Cr 1.24     2.Possible pulmonary HTN - CTA Jan 2024 Central pulmonary arteries  are enlarged in keeping with changes of pulmonary arterial  hypertension.  - - 04/2021 echo pcp office: grossly normal LVEF, limited visualization. Grossly no major valve abnormalitiy - he does have history of OSA  06/2022 echo: LVEF 60-65%, grade I dd, normal RV size and function, inadequate TR jet to estimated PASP     3. OSA - compliant with cpap - followed by Dr Craige Cotta, last seen in May 2024  4. HLD - Jan 2024 TC 185 TG 88 HDL 45 LDL 124 - he is on crestor 5mg  daily - reports upcoming labs with pcp        Other medical issues not addressed this visit   1.Syncope - seen in ER 04/06/21 - was having abdominal and neck pain at the time - EKG NSR, benign labs - 04/2021 echo pcp office: grossly normal LVEF, limited visualization. Grossly no major valve abnormalitiy - pcp 14 day zio patch: min HR 50, max HR 152, Avg HR 73. Rare supraventricular ectopy, three runs of SVT longest 8 beats. Frequent isolated PVCs (6.3%), no NSVT or VT.      - symptoms started Jan 30 - Jan 31 in bathroom. Stood up to walk to bathroom, walked to bathroom. Washing your hands. Felt lightheaded, then passed out and felt going down. Came to quickly, very brief LOC. Got up and family helped back to bedroom. Rested and layed in bed - no issues  leading up to that day     Lightheadedness, vision blurry episodes daily. Can occur with with sitting or standing. Can be laying down and symptoms. Some palpitatons at times.    Sometimes if laying on left side and tilts head to left, feels like he may pass out. Resolves with position change.    Pcp started toprol, stopped norvasc.    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 90 tablet 1   Azelastine HCl (ASTEPRO)  0.15 % SOLN Place 1 spray into the nose daily in the afternoon.     chlorthalidone (HYGROTON) 25 MG tablet Take 1 tablet (25 mg total) by mouth every other day. 90 tablet 1   fluticasone (FLONASE) 50 MCG/ACT nasal spray SPRAY 2 SPRAYS INTO EACH NOSTRIL EVERY DAY 16 mL 5   lisinopril (ZESTRIL) 20 MG tablet Take 1 tablet (20 mg total) by mouth daily. 90 tablet 1   Olopatadine HCl 0.2 % SOLN Apply 1 drop to eye daily as needed. (Patient taking differently: Place 1 drop into both eyes daily.) 2.5 mL 5   pantoprazole (PROTONIX) 40 MG tablet Take 1 tablet (40 mg total) by mouth daily before breakfast. 30 tablet 3   pregabalin (LYRICA) 150 MG capsule Take 1 capsule (150 mg total) by mouth 2 (two) times daily. 120 capsule 5   rosuvastatin (CRESTOR) 5 MG tablet Take 5 mg by mouth at bedtime.     No current facility-administered medications for this  visit.     Past Surgical History:  Procedure Laterality Date   ANKLE SURGERY     BIOPSY  04/21/2022   Procedure: BIOPSY;  Surgeon: Lanelle Bal, DO;  Location: [facility];  Service: Endoscopy;;   BIOPSY  09/15/2022   Procedure: BIOPSY;  Surgeon: Lanelle Bal, DO;  Location: [facility];  Service: Endoscopy;;   brain tumor removal  2014   CARPAL TUNNEL RELEASE Left 08/15/2021   Procedure: LEFT CARPAL TUNNEL RELEASE;  Surgeon: Eldred Manges, MD;  Location: [facility];  Service: Orthopedics;  Laterality: Left;   CATARACT EXTRACTION W/PHACO Right 10/13/2022   Procedure: CATARACT EXTRACTION PHACO AND INTRAOCULAR LENS PLACEMENT (IOC);  Surgeon: Pecolia Ades, MD;  Location: [facility];  Service: Ophthalmology;  Laterality: Right;  CDE 2.87   CATARACT EXTRACTION W/PHACO Left 10/27/2022   Procedure: CATARACT EXTRACTION PHACO AND INTRAOCULAR LENS PLACEMENT (IOC);  Surgeon: Pecolia Ades, MD;  Location: [facility];  Service: Ophthalmology;  Laterality: Left;  CDE 3.07   COLONOSCOPY WITH PROPOFOL N/A 01/13/2019   Procedure: COLONOSCOPY WITH PROPOFOL;  Surgeon: Corbin Ade, MD;  20 mm polyp found in the cecum that was removed via piecemeal hot snare polypectomy. Repeat in 4 months. path tubulovillous adenoma, negative for high-grade dysplasia   COLONOSCOPY WITH PROPOFOL N/A 01/20/2019   Procedure: COLONOSCOPY WITH PROPOFOL;  Surgeon: West Bali, MD; large defect (2 cm x 1.5 cm with small visible vessel with stigmata of recent bleed) in the cecum.  3 hemostatic clips placed (MR conditional).  One 3 mm polyp in the cecum, tortuous colon, external and internal hemorrhoids.  Pathology revealed tubular adenoma.   COLONOSCOPY WITH PROPOFOL N/A 10/06/2021   Procedure: COLONOSCOPY WITH PROPOFOL;  Surgeon: Lanelle Bal, DO;  Location: [facility];  Service: Endoscopy;  Laterality: N/A;  7:30am   ESOPHAGOGASTRODUODENOSCOPY (EGD) WITH PROPOFOL N/A 04/21/2022   Procedure:  ESOPHAGOGASTRODUODENOSCOPY (EGD) WITH PROPOFOL;  Surgeon: Lanelle Bal, DO;  [facility]: [facility];  Service: Endoscopy;  Laterality: N/A;  145pm, asa 3, pt can't come earlier - transportation   ESOPHAGOGASTRODUODENOSCOPY (EGD) WITH PROPOFOL N/A 09/15/2022   Procedure: ESOPHAGOGASTRODUODENOSCOPY (EGD) WITH PROPOFOL;  Surgeon: Lanelle Bal, DO;  Location: [facility];  Service: Endoscopy;  Laterality: N/A;  8:00 am , asa 3   HERNIA REPAIR  2010   umbilical   NASAL SEPTOPLASTY W/ TURBINOPLASTY Bilateral 11/30/2020   Procedure: NASAL SEPTOPLASTY WITH BILATERAL  TURBINATE REDUCTION;  Surgeon: Newman Pies, MD;  Location: [facility];  Service: ENT;  Laterality: Bilateral;   POLYPECTOMY  01/13/2019   Procedure: POLYPECTOMY;  Surgeon: Corbin Ade, MD;  [facility];  Service: Endoscopy;;  Large Cecal Polyp    POLYPECTOMY  10/06/2021   Procedure: POLYPECTOMY;  Surgeon: Lanelle Bal, DO;  Location: [facility];  Service: Endoscopy;;   POLYPECTOMY  09/15/2022   Procedure: POLYPECTOMY;  Surgeon: Lanelle Bal, DO;  Location: [facility];  Service: Endoscopy;;   SINOSCOPY     ULNAR TUNNEL RELEASE Left 07/19/2022   Procedure: LEFT CUBITAL TUNNEL RELEASE;  Surgeon: Eldred Manges, MD;  Location: [facility];  Service: Orthopedics;  Laterality: Left;     Allergies  Allergen Reactions   Methocarbamol Other (See Comments)    Twitching in sleep      Family History  Problem Relation Age of Onset   Thyroid disease Mother    COPD Father    Cancer Maternal Aunt    Cancer Maternal Uncle        back   Cancer Paternal Uncle        throat    Cancer Cousin        prostate   Colon cancer Neg Hx    Colon polyps Neg Hx    Allergic rhinitis Neg Hx    Angioedema Neg Hx    Asthma Neg Hx    Eczema Neg Hx      Social History Mr. Shaulis reports that he quit smoking about 39 years ago. His smoking use included cigarettes. He started smoking  about 41 years ago. He has a 1 pack-year smoking history. He has been exposed to tobacco smoke. He has never used smokeless tobacco. Mr. Sepanski reports that he does not currently use alcohol.   Review of Systems CONSTITUTIONAL: No weight loss, fever, chills, weakness or fatigue.  HEENT: Eyes: No visual loss, blurred vision, double vision or yellow sclerae.No hearing loss, sneezing, congestion, runny nose or sore throat.  SKIN: No rash or itching.  CARDIOVASCULAR: per hpi RESPIRATORY: No shortness of breath, cough or sputum.  GASTROINTESTINAL: No anorexia, nausea, vomiting or diarrhea. No abdominal pain or blood.  GENITOURINARY: No burning on urination, no polyuria NEUROLOGICAL: +vertigo MUSCULOSKELETAL: No muscle, back pain, joint pain or stiffness.  LYMPHATICS: No enlarged nodes. No history of splenectomy.  PSYCHIATRIC: No history of depression or anxiety.  ENDOCRINOLOGIC: No reports of sweating, cold or heat intolerance. No polyuria or polydipsia.  [name] Kitchen   Physical Examination Today's Vitals   11/20/22 0818  BP: 128/78  Pulse: 87  SpO2: 97%  Weight: (!) 312 lb 12.8 oz (141.9 kg)  Height: 5\' 10"  (1.778 m)   Body mass index is 44.88 kg/m.  Gen: resting comfortably, no acute distress HEENT: no scleral icterus, pupils equal round and reactive, no palptable cervical adenopathy,  CV: RRR, no m/rg, no jvd Resp: Clear to auscultation bilaterally GI: abdomen is soft, non-tender, non-distended, normal bowel sounds, no hepatosplenomegaly MSK: extremities are warm, no edema.  Skin: warm, no rash Neuro:  no focal deficits Psych: appropriate affect   Diagnostic Studies  06/2022 echo 1. Left ventricular ejection fraction, by estimation, is 60 to 65%. The  left ventricle has normal function. The left ventricle has no regional  wall motion abnormalities. Left ventricular diastolic parameters are  consistent with Grade I diastolic  dysfunction (impaired relaxation). The average left  ventricular global  longitudinal strain is -22.9 %. The global longitudinal strain is  normal.   2. Right ventricular systolic function is normal. The right ventricular  size is normal. Tricuspid regurgitation signal is inadequate for assessing  PA pressure.   3. The mitral valve is normal in structure. No evidence of mitral valve  regurgitation. No evidence of mitral stenosis.   4. The aortic valve is tricuspid. There is mild calcification of the  aortic valve. There is mild thickening of the aortic valve. Aortic valve  regurgitation is not visualized. No aortic stenosis is present.     Assessment and Plan   HTN - at goal, continue current meds  2. Possible pulmonary HTN - suggested by recent CTA. He does have grade I dd, OSA.  - echos have had inadequate TR jet not able to estimate PASP. Have shown normal RV and RA. In absence of symptoms with normal RA/RV would not pursue additional testing at this time. Continue to manage HTN/diastolic dysfunction as well as OSA  3. HLD - continue crestor, upcoming labs with pcp - goal LDL would be <100, may need titration of crestor pending upcoming labs   Antoine Poche, M.D.

## 2022-11-21 DIAGNOSIS — M47816 Spondylosis without myelopathy or radiculopathy, lumbar region: Secondary | ICD-10-CM | POA: Diagnosis not present

## 2022-11-21 DIAGNOSIS — M47812 Spondylosis without myelopathy or radiculopathy, cervical region: Secondary | ICD-10-CM | POA: Diagnosis not present

## 2022-11-21 DIAGNOSIS — S233XXA Sprain of ligaments of thoracic spine, initial encounter: Secondary | ICD-10-CM | POA: Diagnosis not present

## 2022-11-21 DIAGNOSIS — M531 Cervicobrachial syndrome: Secondary | ICD-10-CM | POA: Diagnosis not present

## 2022-11-23 DIAGNOSIS — M47816 Spondylosis without myelopathy or radiculopathy, lumbar region: Secondary | ICD-10-CM | POA: Diagnosis not present

## 2022-11-23 DIAGNOSIS — S233XXA Sprain of ligaments of thoracic spine, initial encounter: Secondary | ICD-10-CM | POA: Diagnosis not present

## 2022-11-23 DIAGNOSIS — M531 Cervicobrachial syndrome: Secondary | ICD-10-CM | POA: Diagnosis not present

## 2022-11-23 DIAGNOSIS — M47812 Spondylosis without myelopathy or radiculopathy, cervical region: Secondary | ICD-10-CM | POA: Diagnosis not present

## 2022-11-24 DIAGNOSIS — Z23 Encounter for immunization: Secondary | ICD-10-CM | POA: Diagnosis not present

## 2022-11-24 DIAGNOSIS — R42 Dizziness and giddiness: Secondary | ICD-10-CM | POA: Diagnosis not present

## 2022-11-24 DIAGNOSIS — I10 Essential (primary) hypertension: Secondary | ICD-10-CM | POA: Diagnosis not present

## 2022-11-24 DIAGNOSIS — Z299 Encounter for prophylactic measures, unspecified: Secondary | ICD-10-CM | POA: Diagnosis not present

## 2022-11-24 DIAGNOSIS — E7800 Pure hypercholesterolemia, unspecified: Secondary | ICD-10-CM | POA: Diagnosis not present

## 2022-11-28 ENCOUNTER — Other Ambulatory Visit: Payer: Self-pay | Admitting: Gastroenterology

## 2022-11-28 DIAGNOSIS — R1013 Epigastric pain: Secondary | ICD-10-CM

## 2022-11-30 DIAGNOSIS — M531 Cervicobrachial syndrome: Secondary | ICD-10-CM | POA: Diagnosis not present

## 2022-11-30 DIAGNOSIS — M47812 Spondylosis without myelopathy or radiculopathy, cervical region: Secondary | ICD-10-CM | POA: Diagnosis not present

## 2022-11-30 DIAGNOSIS — S233XXA Sprain of ligaments of thoracic spine, initial encounter: Secondary | ICD-10-CM | POA: Diagnosis not present

## 2022-11-30 DIAGNOSIS — M47816 Spondylosis without myelopathy or radiculopathy, lumbar region: Secondary | ICD-10-CM | POA: Diagnosis not present

## 2022-12-05 DIAGNOSIS — M47816 Spondylosis without myelopathy or radiculopathy, lumbar region: Secondary | ICD-10-CM | POA: Diagnosis not present

## 2022-12-05 DIAGNOSIS — S233XXA Sprain of ligaments of thoracic spine, initial encounter: Secondary | ICD-10-CM | POA: Diagnosis not present

## 2022-12-05 DIAGNOSIS — M531 Cervicobrachial syndrome: Secondary | ICD-10-CM | POA: Diagnosis not present

## 2022-12-05 DIAGNOSIS — M47812 Spondylosis without myelopathy or radiculopathy, cervical region: Secondary | ICD-10-CM | POA: Diagnosis not present

## 2022-12-07 DIAGNOSIS — M47812 Spondylosis without myelopathy or radiculopathy, cervical region: Secondary | ICD-10-CM | POA: Diagnosis not present

## 2022-12-07 DIAGNOSIS — M47816 Spondylosis without myelopathy or radiculopathy, lumbar region: Secondary | ICD-10-CM | POA: Diagnosis not present

## 2022-12-07 DIAGNOSIS — M25612 Stiffness of left shoulder, not elsewhere classified: Secondary | ICD-10-CM | POA: Diagnosis not present

## 2022-12-07 DIAGNOSIS — M531 Cervicobrachial syndrome: Secondary | ICD-10-CM | POA: Diagnosis not present

## 2022-12-07 DIAGNOSIS — S233XXA Sprain of ligaments of thoracic spine, initial encounter: Secondary | ICD-10-CM | POA: Diagnosis not present

## 2022-12-11 ENCOUNTER — Ambulatory Visit (INDEPENDENT_AMBULATORY_CARE_PROVIDER_SITE_OTHER): Payer: 59 | Admitting: Internal Medicine

## 2022-12-11 ENCOUNTER — Other Ambulatory Visit: Payer: Self-pay | Admitting: Internal Medicine

## 2022-12-11 ENCOUNTER — Encounter: Payer: Self-pay | Admitting: Internal Medicine

## 2022-12-11 ENCOUNTER — Telehealth: Payer: Self-pay | Admitting: Internal Medicine

## 2022-12-11 ENCOUNTER — Other Ambulatory Visit: Payer: Self-pay

## 2022-12-11 VITALS — BP 110/74 | HR 121 | Temp 98.7°F | Resp 16 | Ht 68.5 in | Wt 310.2 lb

## 2022-12-11 DIAGNOSIS — J302 Other seasonal allergic rhinitis: Secondary | ICD-10-CM

## 2022-12-11 DIAGNOSIS — J3089 Other allergic rhinitis: Secondary | ICD-10-CM

## 2022-12-11 MED ORDER — AZELASTINE HCL 0.15 % NA SOLN: 1.0000 | Freq: Two times a day (BID) | NASAL | 5 refills | Status: DC

## 2022-12-11 MED ORDER — FLUTICASONE PROPIONATE 50 MCG/ACT NA SUSP: 2.0000 | Freq: Every day | NASAL | 5 refills | Status: DC

## 2022-12-11 MED ORDER — CETIRIZINE HCL 10 MG PO TABS: 10.0000 mg | ORAL_TABLET | Freq: Every day | ORAL | 5 refills | Status: DC

## 2022-12-11 MED ORDER — EPINEPHRINE 0.3 MG/0.3ML IJ SOAJ: 0.3000 mg | INTRAMUSCULAR | 1 refills | Status: AC | PRN

## 2022-12-11 NOTE — Progress Notes (Signed)
Aeroallergen Immunotherapy   Ordering Provider: Alesia Morin   Patient Details  Name: Micheal Ross.  MRN: [id number]  Date of Birth: 19-May-1960   Order 1 of 2   Vial Label: trees   0.5 ml (Volume)  1:20 Concentration -- Eastern 10 Tree Mix (also Sweet Gum)    0.5  ml Extract Subtotal  4.5  ml Diluent  5.0  ml Maintenance Total   Schedule:  C  Silver Vial (1:1,000,000): Schedule C (5 doses)  Blue Vial (1:100,000): Schedule C (5 doses)  Yellow Vial (1:10,000): Schedule C (5 doses)  Green Vial (1:1,000): Schedule C (5 doses)  Red Vial (1:100): Schedule A (10 doses)   Special Instructions: bring Epipen and sign conset

## 2022-12-11 NOTE — Telephone Encounter (Signed)
Patient came in for an appointment today and called his insurance company. Patient states his insurance does cover allergy shots the appointment has been made for Monday November 4th at 11:00.

## 2022-12-11 NOTE — Progress Notes (Signed)
Aeroallergen Immunotherapy  Ordering Provider: Alesia Morin  Patient Details Name: Micheal Ross. MRN: [id number] Date of Birth: Jun 24, 1960  Order 2 of 2  Vial Label: molds  0.2 ml (Volume)  1:20 Concentration -- Alternaria alternata 0.2 ml (Volume)  1:20 Concentration -- Cladosporium herbarum 0.2 ml (Volume)  1:10 Concentration -- Aspergillus mix 0.2 ml (Volume)  1:10 Concentration -- Penicillium mix 0.2 ml (Volume)  1:20 Concentration -- Bipolaris sorokiniana 0.2 ml (Volume)  1:20 Concentration -- Drechslera spicifera 0.2 ml (Volume)  1:10 Concentration -- Mucor plumbeus 0.2 ml (Volume)  1:10 Concentration -- Fusarium moniliforme 0.2 ml (Volume)  1:40 Concentration -- Aureobasidium pullulans 0.2 ml (Volume)  1:10 Concentration -- Rhizopus oryzae   2  ml Extract Subtotal 3  ml Diluent 5.0  ml Maintenance Total  Schedule:  C Silver Vial (1:1,000,000): Schedule C (5 doses) Blue Vial (1:100,000): Schedule C (5 doses) Yellow Vial (1:10,000): Schedule C (5 doses) Green Vial (1:1,000): Schedule C (5 doses) Red Vial (1:100): Schedule A (10 doses)  Special Instructions: sign consent and Bring Epipen

## 2022-12-11 NOTE — Progress Notes (Signed)
VIALS EXP 12-11-23

## 2022-12-11 NOTE — Patient Instructions (Addendum)
Allergic Rhinitis: - Positive skin test to: trees, molds, tobacco leaf - Avoidance measures discussed. - Use nasal saline rinses before nose sprays such as with Neilmed Sinus Rinse bottle.  Use distilled water with the packets.  - Use Flonase 2 sprays each nostril daily. Aim upward and outward. - Use Azelastine 1 spray each nostril twice daily. Aim upward and outward. - Use Zyrtec 10 mg daily.  - Consider allergy shots as long term control of your symptoms by teaching your immune system to be more tolerant of your allergy triggers.   If interested, call insurance company and let us know.  Must bring Epipen for shots.

## 2022-12-11 NOTE — Progress Notes (Signed)
AIT Rx signed and Epipen sent.

## 2022-12-11 NOTE — Progress Notes (Signed)
FOLLOW UP Date of Service/Encounter:  12/11/22   Subjective:  Micheal Ross. (DOB: Jul 14, 1960) is a 62 y.o. male who [facility] for follow up for allergic rhinitis.   History obtained from: chart review and patient. Last visit was on 01/16/2022 with me and at the time was started on Flonase, Azelastine, Zyrtec.  Since last visit, reports he still has trouble with congestion, stuffiness, pressure in sinuses, drainage.  Also worried about vertigo. Sees chiropractor too who believes he has allergies.  Using saline rinses, Flonase, Zyrtec daily; they do help but don't resolve his symptoms completely.   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 Physical Exam: GEN: alert, well developed HEENT: clear conjunctiva, TM grey and translucent, nose with mild inferior turbinate hypertrophy, pink nasal mucosa, clear rhinorrhea, slight cobblestoning HEART: regular rate and rhythm, no murmur LUNGS: clear to auscultation bilaterally, no coughing, unlabored respiration SKIN: no rashes or lesions   Assessment:   1. Seasonal and perennial allergic rhinitis     Plan/Recommendations:  Allergic Rhinitis: - Remains uncontrolled, will add Azelastine. If no improvement consider AIT.  - Positive skin test to: trees, molds, tobacco leaf - Avoidance measures discussed. - Use nasal saline rinses before nose sprays such as with Neilmed Sinus Rinse bottle.  Use distilled water with the packets.  - Use Flonase 2 sprays each nostril daily. Aim upward  and outward. - Use Azelastine 1 spray each nostril twice daily. Aim upward and outward. - Use Zyrtec 10 mg daily.  - Consider allergy shots as long term control of your symptoms by teaching your immune system to be more tolerant of your allergy triggers.   If interested, call insurance company and let us know.  Must bring Epipen for shots.     Return in about 6 months (around 06/11/2023).  Alesia Morin, MD [facility]

## 2022-12-12 DIAGNOSIS — M531 Cervicobrachial syndrome: Secondary | ICD-10-CM | POA: Diagnosis not present

## 2022-12-12 DIAGNOSIS — M47816 Spondylosis without myelopathy or radiculopathy, lumbar region: Secondary | ICD-10-CM | POA: Diagnosis not present

## 2022-12-12 DIAGNOSIS — S233XXA Sprain of ligaments of thoracic spine, initial encounter: Secondary | ICD-10-CM | POA: Diagnosis not present

## 2022-12-12 DIAGNOSIS — M47812 Spondylosis without myelopathy or radiculopathy, cervical region: Secondary | ICD-10-CM | POA: Diagnosis not present

## 2022-12-12 DIAGNOSIS — M25612 Stiffness of left shoulder, not elsewhere classified: Secondary | ICD-10-CM | POA: Diagnosis not present

## 2022-12-12 DIAGNOSIS — J301 Allergic rhinitis due to pollen: Secondary | ICD-10-CM | POA: Diagnosis not present

## 2022-12-13 DIAGNOSIS — J302 Other seasonal allergic rhinitis: Secondary | ICD-10-CM | POA: Diagnosis not present

## 2022-12-14 DIAGNOSIS — M47812 Spondylosis without myelopathy or radiculopathy, cervical region: Secondary | ICD-10-CM | POA: Diagnosis not present

## 2022-12-14 DIAGNOSIS — M25612 Stiffness of left shoulder, not elsewhere classified: Secondary | ICD-10-CM | POA: Diagnosis not present

## 2022-12-14 DIAGNOSIS — S233XXA Sprain of ligaments of thoracic spine, initial encounter: Secondary | ICD-10-CM | POA: Diagnosis not present

## 2022-12-14 DIAGNOSIS — M531 Cervicobrachial syndrome: Secondary | ICD-10-CM | POA: Diagnosis not present

## 2022-12-14 DIAGNOSIS — M47816 Spondylosis without myelopathy or radiculopathy, lumbar region: Secondary | ICD-10-CM | POA: Diagnosis not present

## 2022-12-18 DIAGNOSIS — M47812 Spondylosis without myelopathy or radiculopathy, cervical region: Secondary | ICD-10-CM | POA: Diagnosis not present

## 2022-12-18 DIAGNOSIS — M25612 Stiffness of left shoulder, not elsewhere classified: Secondary | ICD-10-CM | POA: Diagnosis not present

## 2022-12-18 DIAGNOSIS — M47816 Spondylosis without myelopathy or radiculopathy, lumbar region: Secondary | ICD-10-CM | POA: Diagnosis not present

## 2022-12-18 DIAGNOSIS — S233XXA Sprain of ligaments of thoracic spine, initial encounter: Secondary | ICD-10-CM | POA: Diagnosis not present

## 2022-12-18 DIAGNOSIS — M531 Cervicobrachial syndrome: Secondary | ICD-10-CM | POA: Diagnosis not present

## 2022-12-18 NOTE — Therapy (Signed)
OUTPATIENT PHYSICAL THERAPY VESTIBULAR EVALUATION     Patient Name: Micheal Ross. MRN: [id number] DOB:[birth year], 62 y.o., male Today's Date: 12/19/2022  END OF SESSION:  PT End of Session - 12/19/22 0941     Visit Number 0    Number of Visits 8    Date for PT Re-Evaluation 01/16/23    Authorization Type [employer]    Authorization Time Period no auth; no vl    Authorization - Number of Visits --    Progress Note Due on Visit 8    PT Start Time 0935    PT Stop Time 1015    PT Time Calculation (min) 40 min    Activity Tolerance Patient tolerated treatment well;Patient limited by pain    Behavior During Therapy WFL for tasks assessed/performed             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)   Dizziness    Hypercholesterolemia    Hypertension    Neck pain    Shoulder pain    Sleep apnea    uses bipap at home   Past Surgical History:  Procedure Laterality Date   ANKLE SURGERY     BIOPSY  04/21/2022   Procedure: BIOPSY;  Surgeon: Lanelle Bal, DO;  Location: [facility];  Service: Endoscopy;;   BIOPSY  09/15/2022   Procedure: BIOPSY;  Surgeon: Lanelle Bal, DO;  [facility]: [facility];  Service: Endoscopy;;   brain tumor removal  2014   CARPAL TUNNEL RELEASE Left 08/15/2021   Procedure: LEFT CARPAL TUNNEL RELEASE;  Surgeon: Eldred Manges, MD;  Location: [facility];  Service: Orthopedics;  Laterality: Left;   CATARACT EXTRACTION W/PHACO Right 10/13/2022   Procedure: CATARACT EXTRACTION PHACO AND INTRAOCULAR LENS PLACEMENT (IOC);  Surgeon: Pecolia Ades, MD;  Location: [facility];  Service: Ophthalmology;  Laterality: Right;  CDE 2.87   CATARACT EXTRACTION W/PHACO Left 10/27/2022   Procedure: CATARACT EXTRACTION PHACO AND INTRAOCULAR LENS PLACEMENT (IOC);  Surgeon: Pecolia Ades, MD;  Location: [facility];  Service: Ophthalmology;  Laterality: Left;  CDE 3.07    COLONOSCOPY WITH PROPOFOL N/A 01/13/2019   Procedure: COLONOSCOPY WITH PROPOFOL;  Surgeon: Corbin Ade, MD;  20 mm polyp found in the cecum that was removed via piecemeal hot snare polypectomy. Repeat in 4 months. path tubulovillous adenoma, negative for high-grade dysplasia   COLONOSCOPY WITH PROPOFOL N/A 01/20/2019   Procedure: COLONOSCOPY WITH PROPOFOL;  Surgeon: West Bali, MD; large defect (2 cm x 1.5 cm with small visible vessel with stigmata of recent bleed) in the cecum.  3 hemostatic clips placed (MR conditional).  One 3 mm polyp in the cecum, tortuous colon, external and internal hemorrhoids.  Pathology revealed tubular adenoma.   COLONOSCOPY WITH PROPOFOL N/A 10/06/2021   Procedure: COLONOSCOPY WITH PROPOFOL;  Surgeon: Lanelle Bal, DO;  [facility]: [facility];  Service: Endoscopy;  Laterality: N/A;  7:30am   ESOPHAGOGASTRODUODENOSCOPY (EGD) WITH PROPOFOL N/A 04/21/2022   Procedure: ESOPHAGOGASTRODUODENOSCOPY (EGD) WITH PROPOFOL;  Surgeon: Lanelle Bal, DO;  [facility]: [facility];  Service: Endoscopy;  Laterality: N/A;  145pm, asa 3, pt can't come earlier - transportation   ESOPHAGOGASTRODUODENOSCOPY (EGD) WITH PROPOFOL N/A 09/15/2022   Procedure: ESOPHAGOGASTRODUODENOSCOPY (EGD) WITH PROPOFOL;  Surgeon: Lanelle Bal, DO;  [facility]: [facility];  Service: Endoscopy;  Laterality: N/A;  8:00 am , asa  3   HERNIA REPAIR  2010   umbilical   NASAL SEPTOPLASTY W/ TURBINOPLASTY Bilateral 11/30/2020   Procedure: NASAL SEPTOPLASTY WITH BILATERAL  TURBINATE REDUCTION;  Surgeon: Newman Pies, MD;  Location: [facility];  Service: ENT;  Laterality: Bilateral;   POLYPECTOMY  01/13/2019   Procedure: POLYPECTOMY;  Surgeon: Corbin Ade, MD;  [facility];  Service: Endoscopy;;  Large Cecal Polyp    POLYPECTOMY  10/06/2021   Procedure: POLYPECTOMY;  Surgeon: Lanelle Bal, DO;  [facility]: [facility];  Service: Endoscopy;;   POLYPECTOMY   09/15/2022   Procedure: POLYPECTOMY;  Surgeon: Lanelle Bal, DO;  Location: [facility];  Service: Endoscopy;;   SINOSCOPY     ULNAR TUNNEL RELEASE Left 07/19/2022   Procedure: LEFT CUBITAL TUNNEL RELEASE;  Surgeon: Eldred Manges, MD;  Location: [facility];  Service: Orthopedics;  Laterality: Left;   Patient Active Problem List   Diagnosis Date Noted   Cubital tunnel syndrome on left 06/29/2022   Carpal tunnel syndrome of right wrist 06/12/2022   Ulnar neuropathy at elbow 06/12/2022   Low back pain 10/13/2021   Carpal tunnel syndrome, left upper limb 04/28/2021   Bee sting 12/05/2019   Obstructive sleep apnea 03/31/2019   Acute blood loss anemia 01/20/2019   Tachycardia 01/20/2019   Lower GI bleed    Weakness of both lower extremities 01/15/2019   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 Kirstie Peri REFERRING PROVIDER: Kirstie Peri, MD  REFERRING DIAG: VERTIGO  THERAPY DIAG:  Dizziness and giddiness - Plan: PT plan of care cert/re-cert  Cervicalgia - Plan: PT plan of care cert/re-cert  ONSET DATE: November 2021  Rationale for Evaluation and Treatment: Rehabilitation  SUBJECTIVE:   SUBJECTIVE STATEMENT: Well known to this clinic; treated recently for neck pain; last visit here 09/06/22; last subjective; "Seeing a chiropractor for dizziness as well and some adjustments for his neck. 3/10 pain this morning.  No change in symptoms since start of therapy."  Patient reports dizziness has been ongoing since November 2021 before his neck symptoms started; Went to the ED back in Nov 2021 and was given meclinizine at that time although he is not longer taking it; dizziness is constant but increases if I turn my head quickly or  walk too fast. Can also get dizzy when watching TV if the action on the screen moves too much. Also feels off balance if trying to stand in one place.    Cataracts removed last month so wearing sunglasses.  12/26/22 will be seeing neurologist and having another MRI; MD states it may also be allergy related?     EVAL:Pt reporting that he has been having neck pain for approximately 2 years.  He reported that his neck pain is primarily in his left neck and left upper shoulder blade.  He also reported left mass that started occurring in January of this year and he has seen 3 doctors have regarded the mass as just "fat."  Patient states that the mass will be bigger in the morning get smaller throughout the day. 5-6/10 currently, previously 8-9/10. Reports sharp, sharp pain. Hand dominance: Right Pt accompanied by: self  PERTINENT HISTORY: brain tumor 2014; left hand carpal tunnel surgery; elbow surgery  last year  PAIN:  Are you having pain? Yes: NPRS scale: 3/10 Pain location: neck Pain description: constant Aggravating factors: unknown Relieving factors: chiropractor, heat  PRECAUTIONS: None  RED FLAGS: Denies numbness and tingling anywhere   WEIGHT BEARING RESTRICTIONS: No  FALLS: Has patient fallen in last 6 months? No   PLOF: Independent  PATIENT GOALS: no more dizziness  OBJECTIVE:  Note: Objective measures were completed at Evaluation unless otherwise noted.  DIAGNOSTIC FINDINGS: will have MRI upcoming  COGNITION: Overall cognitive status: Within functional limits for tasks assessed   SENSATION: wfl  EDEMA:  Still has some swelling left side of the neck/upper trap area   POSTURE:  rounded shoulders and forward head  Cervical ROM:  grossly wfl  Active AROM (deg) eval  Flexion   Extension   Right lateral flexion   Left lateral flexion   Right rotation   Left rotation   (Blank rows = not tested)  STRENGTH: not tested   BED MOBILITY:  Not  tested  TRANSFERS: Assistive device utilized: None  Sit to stand: Modified independence Stand to sit: Modified independence Chair to chair:  not tested Floor:  not tested  GAIT: Gait pattern:  decreased gait speed and wide BOS Distance walked: 50 ft in clinic Assistive device utilized: None Level of assistance: Modified independence Comments: no complaint of back pain  FUNCTIONAL TESTS:  SLS 5" right; 4" left 5 times sit to stand: next visit   PATIENT SURVEYS:  DHI 90 (54+ is severe)  VESTIBULAR ASSESSMENT:  GENERAL OBSERVATION: wearing sunglasses   SYMPTOM BEHAVIOR:  Subjective history: see above  Non-Vestibular symptoms: neck pain  Type of dizziness: Imbalance (Disequilibrium), Spinning/Vertigo, and Unsteady with head/body turns  Frequency: constant  Duration: constant  but increases with movement  Aggravating factors: Induced by position change: supine to sit, Induced by motion: occur when walking, bending down to the ground, turning body quickly, and turning head quickly, and Worse in the morning  Relieving factors: no known relieving factors  Progression of symptoms: better  OCULOMOTOR EXAM:  Ocular Alignment: normal; right eye squints some possibly due to light  Ocular ROM: No Limitations  Spontaneous Nystagmus: absent  Gaze-Induced Nystagmus: absent  Smooth Pursuits: intact  Saccades: intact  Convergence/Divergence: 6 inches   VESTIBULAR - OCULAR REFLEX:   Slow VOR: Normal  VOR Cancellation: Normal states "background bothers me"  Head-Impulse Test: HIT Right: negative HIT Left: negative  Dynamic Visual Acuity:  not tested   POSITIONAL TESTING: Other: not tested on eval  MOTION SENSITIVITY:  Motion Sensitivity Quotient Intensity: 0 = none, 1 = Lightheaded, 2 = Mild, 3 = Moderate, 4 = Severe, 5 = Vomiting  Intensity  1. Sitting to supine   2. Supine to L side   3. Supine to R side   4. Supine to sitting   5. L Hallpike-Dix   6. Up from L    7. R  Hallpike-Dix   8. Up from R    9. Sitting, head tipped to L knee 2  10. Head up from L knee 2  11. Sitting, head tipped to R knee 3  12. Head up from R knee 3  13. Sitting head turns x5 3  14.Sitting head nods x5 4  15. In stance, 180 turn to L    16. In stance, 180 turn to R     OTHOSTATICS: not done     VESTIBULAR TREATMENT:  DATE: 12/19/22 physical therapy evaluation and HEP intruction Exercises - Seated Gaze Stabilization with Head Rotation  - 2-3 x daily - 7 x weekly - 1 sets - 10 reps - Seated Gaze Stabilization with Head Nod  - 2-3 x daily - 7 x weekly - 1 sets - 10 reps  Canalith Repositioning:   Gaze Adaptation:   Habituation:   PATIENT EDUCATION: Education details: Patient educated on exam findings, POC, scope of PT, HEP, and what to expect next visit. Person educated: Patient Education method: Explanation, Demonstration, and Handouts Education comprehension: verbalized understanding, returned demonstration, verbal cues required, and tactile cues required  HOME EXERCISE PROGRAM: Access Code: [email or web address] URL: [email or web address] Date: 12/19/2022 Prepared by: [facility]  Exercises - Seated Gaze Stabilization with Head Rotation  - 2-3 x daily - 7 x weekly - 1 sets - 10 reps - Seated Gaze Stabilization with Head Nod  - 2-3 x daily - 7 x weekly - 1 sets - 10 reps   GOALS: Goals reviewed with patient? No  SHORT TERM GOALS: Target date: 01/02/2023  patient will be independent with initial HEP  Baseline: Goal status: INITIAL  2.  Patient will self report 50% improvement to improve tolerance for functional activity  Baseline:  Goal status: INITIAL  LONG TERM GOALS: Target date: 01/16/2023  Patient will be independent in self management strategies to improve quality of life and functional outcomes.  Baseline:  Goal status: INITIAL  2.   Patient will self report 75% improvement to improve tolerance for functional activity  Baseline:  Goal status: INITIAL  3.  Patient will improve score on DHI by 40 points (50 ) Baseline: 90 Goal status: INITIAL  4.  Patient will be able to watch TV x 1 hour without complaints of dizziness Baseline:  Goal status: INITIAL  5.  Patient will be able to stand SLS x 10" each to demonstrate improved functional balance Baseline: 5" right; 4" left Goal status: INITIAL   ASSESSMENT:  CLINICAL IMPRESSION: Patient is a 62 y.o. male who was seen today for physical therapy evaluation and treatment for VERTIGO. Patient demonstrates increased vestibular symptoms with provocative testing which is negatively impacting patient ability to perform ADLs and functional mobility tasks. Patient will benefit from skilled physical therapy services to address these deficits to improve level of function with ADLs, functional mobility tasks, and reduce risk for falls.    OBJECTIVE IMPAIRMENTS: decreased activity tolerance, decreased balance, decreased knowledge of condition, dizziness, impaired perceived functional ability, and pain.   ACTIVITY LIMITATIONS: standing, sleeping, bed mobility, and locomotion level  PARTICIPATION LIMITATIONS: meal prep, cleaning, laundry, driving, shopping, and community activity  REHAB POTENTIAL: Good  CLINICAL DECISION MAKING: Evolving/moderate complexity  EVALUATION COMPLEXITY: Moderate   PLAN:  PT FREQUENCY: 2x/week  PT DURATION: 4 weeks  PLANNED INTERVENTIONS: Therapeutic exercises, Therapeutic activity, Neuromuscular re-education, Balance training, Gait training, Patient/Family education, Joint manipulation, Joint mobilization, Stair training, Orthotic/Fit training, DME instructions, Aquatic Therapy, Dry Needling, Electrical stimulation, Spinal manipulation, Spinal mobilization, Cryotherapy, Moist heat, Compression bandaging, scar mobilization, Splintting, Taping,  Traction, Ultrasound, Ionotophoresis 4mg /ml Dexamethasone, and Manual therapy   PLAN FOR NEXT SESSION: Review goals and HEP; dix hallpike/ positional testing; progress gaze stabilization as able; balance; history of cervical issues so question possible cervical in origin?also is seeing a chiropractor 2 x a week.    12:10 PM, 12/19/22 Renarda Mullinix Small Jamiyah Dingley MPT [facility] [place] [phone]

## 2022-12-19 ENCOUNTER — Ambulatory Visit (HOSPITAL_COMMUNITY): Payer: 59 | Attending: Neurology

## 2022-12-19 ENCOUNTER — Other Ambulatory Visit: Payer: Self-pay

## 2022-12-19 DIAGNOSIS — R42 Dizziness and giddiness: Secondary | ICD-10-CM | POA: Insufficient documentation

## 2022-12-19 DIAGNOSIS — R293 Abnormal posture: Secondary | ICD-10-CM | POA: Insufficient documentation

## 2022-12-19 DIAGNOSIS — M542 Cervicalgia: Secondary | ICD-10-CM | POA: Insufficient documentation

## 2022-12-21 DIAGNOSIS — M47816 Spondylosis without myelopathy or radiculopathy, lumbar region: Secondary | ICD-10-CM | POA: Diagnosis not present

## 2022-12-21 DIAGNOSIS — M531 Cervicobrachial syndrome: Secondary | ICD-10-CM | POA: Diagnosis not present

## 2022-12-21 DIAGNOSIS — M25612 Stiffness of left shoulder, not elsewhere classified: Secondary | ICD-10-CM | POA: Diagnosis not present

## 2022-12-21 DIAGNOSIS — S233XXA Sprain of ligaments of thoracic spine, initial encounter: Secondary | ICD-10-CM | POA: Diagnosis not present

## 2022-12-21 DIAGNOSIS — M47812 Spondylosis without myelopathy or radiculopathy, cervical region: Secondary | ICD-10-CM | POA: Diagnosis not present

## 2022-12-25 ENCOUNTER — Ambulatory Visit (HOSPITAL_COMMUNITY): Payer: 59

## 2022-12-25 ENCOUNTER — Ambulatory Visit: Payer: 59 | Admitting: Neurology

## 2022-12-25 DIAGNOSIS — R42 Dizziness and giddiness: Secondary | ICD-10-CM

## 2022-12-25 NOTE — Therapy (Signed)
OUTPATIENT PHYSICAL THERAPY VESTIBULAR TREATMENT     Patient Name: Micheal Ross. 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)   Dizziness    Hypercholesterolemia    Hypertension    Neck pain    Shoulder pain    Sleep apnea    uses bipap at home   Past Surgical History:  Procedure Laterality Date   ANKLE SURGERY     BIOPSY  04/21/2022   Procedure: BIOPSY;  Surgeon: Lanelle Bal, DO;  Location: [facility];  Service: Endoscopy;;   BIOPSY  09/15/2022   Procedure: BIOPSY;  Surgeon: Lanelle Bal, DO;  [facility]: [facility];  Service: Endoscopy;;   brain tumor removal  2014   CARPAL TUNNEL RELEASE Left 08/15/2021   Procedure: LEFT CARPAL TUNNEL RELEASE;  Surgeon: Eldred Manges, MD;  Location: [facility];  Service: Orthopedics;  Laterality: Left;   CATARACT EXTRACTION W/PHACO Right 10/13/2022   Procedure: CATARACT EXTRACTION PHACO AND INTRAOCULAR LENS PLACEMENT (IOC);  Surgeon: Pecolia Ades, MD;  Location: [facility];  Service: Ophthalmology;  Laterality: Right;  CDE 2.87   CATARACT EXTRACTION W/PHACO Left 10/27/2022   Procedure: CATARACT EXTRACTION PHACO AND INTRAOCULAR LENS PLACEMENT (IOC);  Surgeon: Pecolia Ades, MD;  Location: [facility];  Service: Ophthalmology;  Laterality: Left;  CDE 3.07   COLONOSCOPY WITH PROPOFOL N/A 01/13/2019   Procedure:  COLONOSCOPY WITH PROPOFOL;  Surgeon: Corbin Ade, MD;  20 mm polyp found in the cecum that was removed via piecemeal hot snare polypectomy. Repeat in 4 months. path tubulovillous adenoma, negative for high-grade dysplasia   COLONOSCOPY WITH PROPOFOL N/A 01/20/2019   Procedure: COLONOSCOPY WITH PROPOFOL;  Surgeon: West Bali, MD; large defect (2 cm x 1.5 cm with small visible vessel with stigmata of recent bleed) in the cecum.  3 hemostatic clips placed (MR conditional).  One 3 mm polyp in the cecum, tortuous colon, external and internal hemorrhoids.  Pathology revealed tubular adenoma.   COLONOSCOPY WITH PROPOFOL N/A 10/06/2021   Procedure: COLONOSCOPY WITH PROPOFOL;  Surgeon: Lanelle Bal, DO;  [facility]: [facility];  Service: Endoscopy;  Laterality: N/A;  7:30am   ESOPHAGOGASTRODUODENOSCOPY (EGD) WITH PROPOFOL N/A 04/21/2022   Procedure: ESOPHAGOGASTRODUODENOSCOPY (EGD) WITH PROPOFOL;  Surgeon: Lanelle Bal, DO;  [facility]: [facility];  Service: Endoscopy;  Laterality: N/A;  145pm, asa 3, pt can't come earlier - transportation   ESOPHAGOGASTRODUODENOSCOPY (EGD) WITH PROPOFOL N/A 09/15/2022   Procedure: ESOPHAGOGASTRODUODENOSCOPY (EGD) WITH PROPOFOL;  Surgeon: Lanelle Bal, DO;  [facility]: [facility];  Service: Endoscopy;  Laterality: N/A;  8:00 am , asa 3   HERNIA REPAIR  2010   umbilical  NASAL SEPTOPLASTY W/ TURBINOPLASTY Bilateral 11/30/2020   Procedure: NASAL SEPTOPLASTY WITH BILATERAL  TURBINATE REDUCTION;  Surgeon: Newman Pies, MD;  Location: [facility];  Service: ENT;  Laterality: Bilateral;   POLYPECTOMY  01/13/2019   Procedure: POLYPECTOMY;  Surgeon: Corbin Ade, MD;  [facility]: [facility];  Service: Endoscopy;;  Large Cecal Polyp    POLYPECTOMY  10/06/2021   Procedure: POLYPECTOMY;  Surgeon: Lanelle Bal, DO;  [facility]: [facility];  Service: Endoscopy;;   POLYPECTOMY  09/15/2022   Procedure: POLYPECTOMY;  Surgeon: Lanelle Bal,  DO;  Location: [facility];  Service: Endoscopy;;   SINOSCOPY     ULNAR TUNNEL RELEASE Left 07/19/2022   Procedure: LEFT CUBITAL TUNNEL RELEASE;  Surgeon: Eldred Manges, MD;  Location: [facility];  Service: Orthopedics;  Laterality: Left;   Patient Active Problem List   Diagnosis Date Noted   Cubital tunnel syndrome on left 06/29/2022   Carpal tunnel syndrome of right wrist 06/12/2022   Ulnar neuropathy at elbow 06/12/2022   Low back pain 10/13/2021   Carpal tunnel syndrome, left upper limb 04/28/2021   Bee sting 12/05/2019   Obstructive sleep apnea 03/31/2019   Acute blood loss anemia 01/20/2019   Tachycardia 01/20/2019   Lower GI bleed    Weakness of both lower extremities 01/15/2019   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 Kirstie Peri REFERRING PROVIDER: Kirstie Peri, MD  REFERRING DIAG: VERTIGO  THERAPY DIAG:  Dizziness and giddiness  ONSET DATE: November 2021  Rationale for Evaluation and Treatment: Rehabilitation  SUBJECTIVE:   SUBJECTIVE STATEMENT:  Doing well today. Patient just had cataract surgery is doing a lot better now. Also c/o swollen upper back (upper trapezius area) and told him that it was a fat build up. C/O L shoulder pain = 3/10.   Well known to this clinic; treated recently for neck pain; last visit here 09/06/22; last subjective; "Seeing a chiropractor for dizziness as well and some adjustments for his neck. 3/10 pain this morning.  No change in symptoms since start of therapy."  Patient reports dizziness has been ongoing since November 2021 before his neck symptoms started; Went to the ED back in Nov 2021 and was given meclinizine at that time although he is not longer taking it; dizziness is  constant but increases if I turn my head quickly or walk too fast. Can also get dizzy when watching TV if the action on the screen moves too much. Also feels off balance if trying to stand in one place.    Cataracts removed last month so wearing sunglasses.  12/26/22 will be seeing neurologist and having another MRI; MD states it may also be allergy related?     EVAL:Pt reporting that he has been having neck pain for approximately 2 years.  He reported that his neck pain is primarily in his left neck and left upper shoulder blade.  He also reported left mass that started occurring in January of this year and he has seen 3 doctors have regarded the mass as just "fat."  Patient states that the mass will be bigger in the morning get smaller throughout the day. 5-6/10 currently, previously 8-9/10. Reports sharp, sharp pain. Hand dominance: Right Pt accompanied by: self  PERTINENT HISTORY: brain tumor 2014; left hand carpal tunnel surgery; elbow surgery last year  PAIN:  Are you having pain? Yes: NPRS scale: 3/10 Pain location: neck Pain description: constant Aggravating factors: unknown Relieving factors: chiropractor, heat  PRECAUTIONS: None  RED FLAGS: Denies numbness and tingling anywhere   WEIGHT BEARING RESTRICTIONS: No  FALLS: Has patient fallen in last 6 months? No   PLOF: Independent  PATIENT GOALS: no more dizziness  OBJECTIVE:  Note: Objective measures were completed at Evaluation unless otherwise noted.  DIAGNOSTIC FINDINGS: will have MRI upcoming  COGNITION: Overall cognitive status: Within functional limits for tasks assessed   SENSATION: wfl  EDEMA:  Still has some swelling left side of the neck/upper trap area   POSTURE:  rounded shoulders and forward head  Cervical ROM:  grossly wfl  Active AROM (deg) eval  Flexion   Extension   Right lateral flexion   Left lateral flexion   Right rotation   Left rotation   (Blank rows = not tested)  STRENGTH:  not tested   BED MOBILITY:  Not tested  TRANSFERS: Assistive device utilized: None  Sit to stand: Modified independence Stand to sit: Modified independence Chair to chair:  not tested Floor:  not tested  GAIT: Gait pattern:  decreased gait speed and wide BOS Distance walked: 50 ft in clinic Assistive device utilized: None Level of assistance: Modified independence Comments: no complaint of back pain  FUNCTIONAL TESTS:  SLS 5" right; 4" left 5 times sit to stand: next visit   PATIENT SURVEYS:  DHI 90 (54+ is severe)  VESTIBULAR ASSESSMENT:  GENERAL OBSERVATION: wearing sunglasses   SYMPTOM BEHAVIOR:  Subjective history: see above  Non-Vestibular symptoms: neck pain  Type of dizziness: Imbalance (Disequilibrium), Spinning/Vertigo, and Unsteady with head/body turns  Frequency: constant  Duration: constant  but increases with movement  Aggravating factors: Induced by position change: supine to sit, Induced by motion: occur when walking, bending down to the ground, turning body quickly, and turning head quickly, and Worse in the morning  Relieving factors: no known relieving factors  Progression of symptoms: better  OCULOMOTOR EXAM:  Ocular Alignment: normal; right eye squints some possibly due to light  Ocular ROM: No Limitations  Spontaneous Nystagmus: absent  Gaze-Induced Nystagmus: absent  Smooth Pursuits: intact  Saccades: intact  Convergence/Divergence: 6 inches   VESTIBULAR - OCULAR REFLEX:   Slow VOR: Normal  VOR Cancellation: Normal states "background bothers me"  Head-Impulse Test: HIT Right: negative HIT Left: negative  Dynamic Visual Acuity:  not tested   POSITIONAL TESTING: Other: not tested on eval  MOTION SENSITIVITY:  Motion Sensitivity Quotient Intensity: 0 = none, 1 = Lightheaded, 2 = Mild, 3 = Moderate, 4 = Severe, 5 = Vomiting  Intensity  1. Sitting to supine   2. Supine to L side   3. Supine to R side   4. Supine to sitting   5. L  Hallpike-Dix   6. Up from L    7. R Hallpike-Dix   8. Up from R    9. Sitting, head tipped to L knee 2  10. Head up from L knee 2  11. Sitting, head tipped to R knee 3  12. Head up from R knee 3  13. Sitting head turns x5 3  14.Sitting head nods x5 4  15. In stance, 180 turn to L    16. In stance, 180 turn to R     OTHOSTATICS: not done  VESTIBULAR TREATMENT:  DATE:  12/25/22 Review of goals NuStep, seat 12, level 1 x 5' Seated calf stretch x 30"  Cervical rotation AROM X 10 on each Patient education on neuroplasticity and vestibular system Active VBI testing (negative on B sides) Modified Dix-Hallpike and supine roll test (both are negative, no nystagmus and no vertigo reported) Oculomotor reflex testing (c/o dizziness, L > R) Seated:  Horizontal saccades, identifying numbers x 1'  VOR 1, identifying numbers x 1'  VOR cancellation, identifying numbers x 1'  12/19/22 physical therapy evaluation and HEP intruction Exercises - Seated Gaze Stabilization with Head Rotation  - 2-3 x daily - 7 x weekly - 1 sets - 10 reps - Seated Gaze Stabilization with Head Nod  - 2-3 x daily - 7 x weekly - 1 sets - 10 reps  Canalith Repositioning:   Gaze Adaptation:   Habituation:   PATIENT EDUCATION: Education details: Patient educated on exam findings, POC, scope of PT, HEP, and what to expect next visit. Person educated: Patient Education method: Explanation, Demonstration, and Handouts Education comprehension: verbalized understanding, returned demonstration, verbal cues required, and tactile cues required  HOME EXERCISE PROGRAM: HOME EXERCISES FOR YOUR DIZZINESS Do these 2x/day, 7 days/week Do these in sitting GAZE STABILIZATION EXERCISES These exercises are designed to improve your eyes' ability to track objects without getting dizzy. When performing these exercises, make sure you  are facing a blank wall (like a white wall) to avoid distraction. You can wear your glasses/contact lenses if you must.   Before anything, warm-up your neck muscles by bending it forward and back 10 times, side-to-side 10 times, and slowly turn your head left and right 10 times.  Exercise 1 (Horizontal Saccades) Use two cards for this exercise While holding the cards on each hand side by side in sitting with arms outstretched, look at the first item on the other card then look at the next item on the second card then alternate your gaze sequentially. Do NOT move your cards nor the your head. Do the following for one minute each: Identifying numbers Reading words  Exercise 2 (Paradigm x1) Use only one card for this exercise While holding the card in sitting with your arm outstretched, turn your head left and right at your own pace while keeping the card stationary.  Do this for one minute each while you are: Identifying numbers Reading words  Exercise 4 (VOR cancellation) Use only one card for this exercise While holding the card in sitting with your arms outstretched, turn your body, arm, and head at the same time left and right at your own pace Do this for one minute each while you are: Identifying numbers Reading words  Access Code: [email or web address] URL: [email or web address] Date: 12/25/2022 Prepared by: Krystal Clark  Exercises - Seated Calf Stretch with Strap  - 1-2 x daily - 7 x weekly - 3 reps - 30 hold  Access Code: [email or web address] URL: [email or web address] Date: 12/19/2022 Prepared by: [facility]  Exercises - Seated Gaze Stabilization with Head Rotation  - 2-3 x daily - 7 x weekly - 1 sets - 10 reps - Seated Gaze Stabilization with Head Nod  - 2-3 x daily - 7 x weekly - 1 sets - 10 reps   GOALS: Goals reviewed with patient? Yes  SHORT TERM GOALS: Target date: 01/02/2023  patient will be independent with initial HEP  Baseline: Goal status:  INITIAL  2.  Patient will self report 50% improvement to improve tolerance for functional activity  Baseline:  Goal status: INITIAL  LONG TERM GOALS: Target date: 01/16/2023  Patient will be independent in self management strategies to improve quality of life and functional outcomes.  Baseline:  Goal status: INITIAL  2.  Patient will self report 75% improvement to improve tolerance for functional activity  Baseline:  Goal status: INITIAL  3.  Patient will improve score on DHI by 40 points (50 ) Baseline: 90 Goal status: INITIAL  4.  Patient will be able to watch TV x 1 hour without complaints of dizziness Baseline:  Goal status: INITIAL  5.  Patient will be able to stand SLS x 10" each to demonstrate improved functional balance Baseline: 5" right; 4" left Goal status: INITIAL   ASSESSMENT:  CLINICAL IMPRESSION: Interventions today were geared towards LE strengthening, flexibility and gaze stabilization. Tolerated all activities without worsening of symptoms except when doing VOR cancellation where patient reported of slight dizziness. Patient is negative for the modified Dix-Hallpike R/L and supine roll test R/L so patient doesn't exhibit signs of BPPV. Patient is also negative for the active VBI test. Demonstrated appropriate levels of fatigue. Provided slight amount of cueing to ensure correct execution of activity with good carry-over. To date, skilled PT is required to address the impairments and improve function.  EVAL: Patient is a 62 y.o. male who was seen today for physical therapy evaluation and treatment for VERTIGO. Patient demonstrates increased vestibular symptoms with provocative testing which is negatively impacting patient ability to perform ADLs and functional mobility tasks. Patient will benefit from skilled physical therapy services to address these deficits to improve level of function with ADLs, functional mobility tasks, and reduce risk for falls.     OBJECTIVE IMPAIRMENTS: decreased activity tolerance, decreased balance, decreased knowledge of condition, dizziness, impaired perceived functional ability, and pain.   ACTIVITY LIMITATIONS: standing, sleeping, bed mobility, and locomotion level  PARTICIPATION LIMITATIONS: meal prep, cleaning, laundry, driving, shopping, and community activity  REHAB POTENTIAL: Good  CLINICAL DECISION MAKING: Evolving/moderate complexity  EVALUATION COMPLEXITY: Moderate   PLAN:  PT FREQUENCY: 2x/week  PT DURATION: 4 weeks  PLANNED INTERVENTIONS: Therapeutic exercises, Therapeutic activity, Neuromuscular re-education, Balance training, Gait training, Patient/Family education, Joint manipulation, Joint mobilization, Stair training, Orthotic/Fit training, DME instructions, Aquatic Therapy, Dry Needling, Electrical stimulation, Spinal manipulation, Spinal mobilization, Cryotherapy, Moist heat, Compression bandaging, scar mobilization, Splintting, Taping, Traction, Ultrasound, Ionotophoresis 4mg /ml Dexamethasone, and Manual therapy   PLAN FOR NEXT SESSION: progress gaze stabilization as able; balance; history of cervical issues so question possible cervical in origin?also is seeing a chiropractor 2 x a week.    Tish Frederickson. Anyelo Mccue, PT, DPT, OCS Board-Certified Clinical Specialist in Orthopedic PT PT Compact Privilege # ([place]): [phone] T 8:54 AM, 12/25/22

## 2022-12-26 ENCOUNTER — Ambulatory Visit (INDEPENDENT_AMBULATORY_CARE_PROVIDER_SITE_OTHER): Payer: 59 | Admitting: Neurology

## 2022-12-26 ENCOUNTER — Encounter: Payer: Self-pay | Admitting: Neurology

## 2022-12-26 VITALS — BP 130/85 | HR 94 | Ht 69.0 in | Wt 316.0 lb

## 2022-12-26 DIAGNOSIS — M5412 Radiculopathy, cervical region: Secondary | ICD-10-CM

## 2022-12-26 DIAGNOSIS — G5603 Carpal tunnel syndrome, bilateral upper limbs: Secondary | ICD-10-CM

## 2022-12-26 DIAGNOSIS — G5623 Lesion of ulnar nerve, bilateral upper limbs: Secondary | ICD-10-CM | POA: Diagnosis not present

## 2022-12-26 DIAGNOSIS — M542 Cervicalgia: Secondary | ICD-10-CM | POA: Diagnosis not present

## 2022-12-26 NOTE — Patient Instructions (Signed)
Continue current medications Continue current therapy  Continue to follow-up with PCP return in 6 months or sooner if worse.

## 2022-12-26 NOTE — Progress Notes (Signed)
[facility]  PATIENT: Micheal Ross. DOB: 28-Sep-1960  REQUESTING CLINICIAN: Kirstie Peri, MD HISTORY FROM: Patient and spouse  REASON FOR VISIT: Cervical radiculopathy   HISTORICAL  CHIEF COMPLAINT:  Chief Complaint  Patient presents with   Follow-up    Rm12, alone, Cervical radiculopathy Bilateral carpal tunnel syndrome: pt stated that they have dizziness, neck pain, gait abnormality, pt stated cts is better in left hand since surgery but would also like to have right hand done as well.    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  He did follow-up with Dr. Ophelia Charter, did have left cubital tunnel surgery.  Reports the left arm is better, and is pending a possible right carpal tunnel surgery on the right in January.  Currently he is seeing chiropractor, reports that he is neck pain is improving, he is also doing physical therapy but for his vertigo with some improvement.   INTERVAL HISTORY 4/42024 Patient presents today for follow-up, last visit was 6 months ago.  Since then he reports that his neck and shoulder pain has not improved.  He has seen his PCP, went to the ED multiple time, seen neurosurgery and orthopedist but still cannot seem to improve his pain.  He did have a MRI cervical spine back in December which showed mild to moderate foraminal stenosis but no central canal stenosis.  He is following up with Dr. Johnsie Cancel neurosurgery.  He reports also getting steroid injection with minimal relief.  He is still on the Lyrica 150 mg twice daily.   INTERVAL HISTORY 10/12/21 Patient presents today for follow-up, at last visit plan was to start pregabalin for cervical radiculopathy pain, referral to physical therapy and I also advised patient to follow back with  surgeon regarding carpal tunnel release surgery.  He did have the left carpal tunnel release surgery, for the cervical radiculopathy, surgeon has recommended surgery again but patient is deferring.  He is currently getting physical therapy and reports doing the exercises at home with good improvement.  He is on Lyrica and reported the combination of Lyrica and physical therapy actually helps him with his pain. At this moment he is complaining of right-sided above the temple pain . He reports sharp pressure pain, multiple times a day.  And this type of pain has been going on for the past year.  He reports with the pain, he will have some visual disturbance. He denies any loss of vision, denies any nausea, no vomiting.  Pain is at the surgical site for his meningioma resection.   HISTORY OF PRESENT ILLNESS:  This is a 62 year old gentleman past medical history hypertension, hyperlipidemia, obesity, history of left frontal meningioma that was resected who is presenting with complaint of neck pain radiating to the left shoulder since November 2021.  Patient reports pain is almost constant, described as sharp stabbing pain worse when moving his head to the left or laying on his left side.  He did have MRI of the neck which showed mild to moderate bilateral foraminal narrowing but no signs of myelopathy.  He did have a nerve conduction study which showed bilateral cervical radiculopathy especially involving C8-T1 on the left side and moderate left carpal tunnel syndrome and mild right carpal tunnel syndrome.  He did follow-up with the orthopedic surgery who recommend conservative management for  the cervical radiculopathy and surgical management of the left carpal tunnel syndrome. Currently he is on gabapentin 300 mg twice daily but reported help his pain is not controlled. He did completed physical therapy early last year but again no improvement of his symptoms.  Patient also has been reporting of intermittent  lightheadedness.  He did report having orthostatic hypotension in the past, his primary care doctor changed his blood pressure medications and since then he has been doing well.  He reported in January he did have a syncopal episode, lasted about 10 to 20 seconds with quick regain of consciousness, no confusion.  He reported if he held his head back or to the left he will feel lightheaded like he is going to pass out.   OTHER MEDICAL CONDITIONS: Hypertension, Hyperlipidemia, Obesity, left frontal meningioma (resected)    REVIEW OF SYSTEMS: Full 14 system review of systems performed and negative with exception of: as noted in the HPI   ALLERGIES: Allergies  Allergen Reactions   Methocarbamol Other (See Comments)    Twitching in sleep    HOME MEDICATIONS: Outpatient Medications Prior to Visit  Medication Sig Dispense Refill   amLODipine (NORVASC) 10 MG tablet Take 1 tablet (10 mg total) by mouth daily. 90 tablet 1   Azelastine HCl (ASTEPRO) 0.15 % SOLN Place 1 spray into the nose in the morning and at bedtime. 30 mL 5   cetirizine (ZYRTEC ALLERGY) 10 MG tablet Take 1 tablet (10 mg total) by mouth daily. 30 tablet 5   chlorthalidone (HYGROTON) 25 MG tablet Take 1 tablet (25 mg total) by mouth every other day. 90 tablet 1   EPINEPHrine (EPIPEN 2-PAK) 0.3 mg/0.3 mL IJ SOAJ injection Inject 0.3 mg into the muscle as needed for anaphylaxis. 2 each 1   fluticasone (FLONASE) 50 MCG/ACT nasal spray Place 2 sprays into both nostrils daily. 16 g 5   lisinopril (ZESTRIL) 20 MG tablet Take 1 tablet (20 mg total) by mouth daily. 90 tablet 1   Olopatadine HCl 0.2 % SOLN Apply 1 drop to eye daily as needed. (Patient taking differently: Place 1 drop into both eyes daily.) 2.5 mL 5   pregabalin (LYRICA) 150 MG capsule Take 1 capsule (150 mg total) by mouth 2 (two) times daily. 120 capsule 5   rosuvastatin (CRESTOR) 5 MG tablet Take 5 mg by mouth at bedtime.     No facility-administered medications prior to  visit.    PAST MEDICAL HISTORY: 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)   Dizziness    Hypercholesterolemia    Hypertension    Neck pain    Shoulder pain    Sleep apnea    uses bipap at home    PAST SURGICAL HISTORY: Past Surgical History:  Procedure Laterality Date   ANKLE SURGERY     BIOPSY  04/21/2022   Procedure: BIOPSY;  Surgeon: Lanelle Bal, DO;  [facility]: [facility];  Service: Endoscopy;;   BIOPSY  09/15/2022   Procedure: BIOPSY;  Surgeon: Lanelle Bal, DO;  Location: [facility];  Service: Endoscopy;;   brain tumor removal  2014   CARPAL TUNNEL RELEASE Left 08/15/2021   Procedure: LEFT CARPAL TUNNEL RELEASE;  Surgeon: Eldred Manges, MD;  Location: [facility];  Service: Orthopedics;  Laterality: Left;   CATARACT EXTRACTION W/PHACO Right 10/13/2022   Procedure: CATARACT EXTRACTION PHACO AND INTRAOCULAR LENS PLACEMENT (  IOC);  Surgeon: Pecolia Ades, MD;  Location: [facility];  Service: Ophthalmology;  Laterality: Right;  CDE 2.87   CATARACT EXTRACTION W/PHACO Left 10/27/2022   Procedure: CATARACT EXTRACTION PHACO AND INTRAOCULAR LENS PLACEMENT (IOC);  Surgeon: Pecolia Ades, MD;  Location: [facility];  Service: Ophthalmology;  Laterality: Left;  CDE 3.07   COLONOSCOPY WITH PROPOFOL N/A 01/13/2019   Procedure: COLONOSCOPY WITH PROPOFOL;  Surgeon: Corbin Ade, MD;  20 mm polyp found in the cecum that was removed via piecemeal hot snare polypectomy. Repeat in 4 months. path tubulovillous adenoma, negative for high-grade dysplasia   COLONOSCOPY WITH PROPOFOL N/A 01/20/2019   Procedure: COLONOSCOPY WITH PROPOFOL;  Surgeon: West Bali, MD; large defect (2 cm x 1.5 cm with small visible vessel with stigmata of recent bleed) in the cecum.  3 hemostatic clips placed (MR conditional).  One 3 mm polyp in the cecum, tortuous colon, external and internal hemorrhoids.  Pathology revealed  tubular adenoma.   COLONOSCOPY WITH PROPOFOL N/A 10/06/2021   Procedure: COLONOSCOPY WITH PROPOFOL;  Surgeon: Lanelle Bal, DO;  [facility]: [facility];  Service: Endoscopy;  Laterality: N/A;  7:30am   ESOPHAGOGASTRODUODENOSCOPY (EGD) WITH PROPOFOL N/A 04/21/2022   Procedure: ESOPHAGOGASTRODUODENOSCOPY (EGD) WITH PROPOFOL;  Surgeon: Lanelle Bal, DO;  [facility]: [facility];  Service: Endoscopy;  Laterality: N/A;  145pm, asa 3, pt can't come earlier - transportation   ESOPHAGOGASTRODUODENOSCOPY (EGD) WITH PROPOFOL N/A 09/15/2022   Procedure: ESOPHAGOGASTRODUODENOSCOPY (EGD) WITH PROPOFOL;  Surgeon: Lanelle Bal, DO;  [facility]: [facility];  Service: Endoscopy;  Laterality: N/A;  8:00 am , asa 3   HERNIA REPAIR  2010   umbilical   NASAL SEPTOPLASTY W/ TURBINOPLASTY Bilateral 11/30/2020   Procedure: NASAL SEPTOPLASTY WITH BILATERAL  TURBINATE REDUCTION;  Surgeon: Newman Pies, MD;  Location: [facility];  Service: ENT;  Laterality: Bilateral;   POLYPECTOMY  01/13/2019   Procedure: POLYPECTOMY;  Surgeon: Corbin Ade, MD;  [facility];  Service: Endoscopy;;  Large Cecal Polyp    POLYPECTOMY  10/06/2021   Procedure: POLYPECTOMY;  Surgeon: Lanelle Bal, DO;  [facility]: [facility];  Service: Endoscopy;;   POLYPECTOMY  09/15/2022   Procedure: POLYPECTOMY;  Surgeon: Lanelle Bal, DO;  Location: [facility];  Service: Endoscopy;;   SINOSCOPY     ULNAR TUNNEL RELEASE Left 07/19/2022   Procedure: LEFT CUBITAL TUNNEL RELEASE;  Surgeon: Eldred Manges, MD;  Location: [facility];  Service: Orthopedics;  Laterality: Left;    FAMILY HISTORY: Family History  Problem Relation Age of Onset   Thyroid disease Mother    COPD Father    Cancer Maternal Aunt    Cancer Maternal Uncle        back   Cancer Paternal Uncle        throat    Cancer Cousin        prostate   Colon cancer Neg Hx    Colon polyps Neg Hx    Allergic rhinitis Neg Hx     Angioedema Neg Hx    Asthma Neg Hx    Eczema Neg Hx     SOCIAL HISTORY: Social History   Socioeconomic History   Marital status: Married    Spouse name: Not on file   Number of children: Not on file   Years of education: Not on file   Highest education level: Not on file  Occupational History   Not on file  Tobacco  Use   Smoking status: Former    Current packs/day: 0.00    Average packs/day: 0.5 packs/day for 2.0 years (1.0 ttl pk-yrs)    Types: Cigarettes    Start date: 03/29/1981    Quit date: 03/30/1983    Years since quitting: 39.7    Passive exposure: Past   Smokeless tobacco: Never  Vaping Use   Vaping status: Never Used  Substance and Sexual Activity   Alcohol use: Not Currently    Comment: occ   Drug use: No   Sexual activity: Yes    Comment: married for 1985, 6 grown kids  Other Topics Concern   Not on file  Social History Narrative   Not on file   Social Determinants of Health   Financial Resource Strain: Not on file  Food Insecurity: Not on file  Transportation Needs: Not on file  Physical Activity: Not on file  Stress: Not on file  Social Connections: Not on file  Intimate Partner Violence: Not on file    PHYSICAL EXAM  GENERAL EXAM/CONSTITUTIONAL: Vitals:  Vitals:   12/26/22 0754 12/26/22 0755  BP:  130/85  Pulse:  94  SpO2:  97%  Weight: (!) 316 lb (143.3 kg)   Height: 5\' 9"  (1.753 m)    Body mass index is 46.67 kg/m. Wt Readings from Last 3 Encounters:  12/26/22 (!) 316 lb (143.3 kg)  12/11/22 (!) 310 lb 4 oz (140.7 kg)  11/20/22 (!) 312 lb 12.8 oz (141.9 kg)   Patient is in no distress; well developed, nourished and groomed; neck is supple  MUSCULOSKELETAL: Gait, strength, tone, movements noted in Neurologic exam below. He does have left cervical paraspinal muscle tenderness. There is soft tissue swelling left side of neck.   NEUROLOGIC: MENTAL STATUS:      No data to display         awake, alert, oriented to person,  place and time recent and remote memory intact normal attention and concentration language fluent, comprehension intact, naming intact fund of knowledge appropriate  CRANIAL NERVE:  2nd, 3rd, 4th, 6th - visual fields full to confrontation, extraocular muscles intact, no nystagmus 5th - facial sensation symmetric 7th - facial strength symmetric 8th - hearing intact 9th - palate elevates symmetrically, uvula midline 11th - shoulder shrug symmetric 12th - tongue protrusion midline  MOTOR:  normal bulk and tone, full strength in the BUE, BLE with good effort but the left is limited by pain  SENSORY:  normal and symmetric to light touch  COORDINATION:  finger-nose-finger, fine finger movements normal  REFLEXES:  deep tendon reflexes present and symmetric  GAIT/STATION:  normal  DIAGNOSTIC DATA (LABS, IMAGING, TESTING) - I reviewed patient records, labs, notes, testing and imaging myself where available.  Lab Results  Component Value Date   WBC 4.1 04/01/2022   HGB 13.9 04/01/2022   HCT 39.7 04/01/2022   MCV 91.3 04/01/2022   PLT 182 04/01/2022      Component Value Date/Time   NA 136 07/18/2022 0849   NA 139 03/31/2019 1053   K 3.8 07/18/2022 0849   CL 98 07/18/2022 0849   CO2 28 07/18/2022 0849   GLUCOSE 105 (H) 07/18/2022 0849   BUN 11 07/18/2022 0849   BUN 13 03/31/2019 1053   CREATININE 1.24 07/18/2022 0849   CALCIUM 9.2 07/18/2022 0849   PROT 7.5 04/01/2022 0846   PROT 7.6 03/31/2019 1053   ALBUMIN 4.5 04/01/2022 0846   ALBUMIN 4.8 03/31/2019 1053   AST 20  04/01/2022 0846   ALT 22 04/01/2022 0846   ALKPHOS 61 04/01/2022 0846   BILITOT 0.6 04/01/2022 0846   BILITOT <0.2 03/31/2019 1053   GFRNONAA >60 07/18/2022 0849   GFRAA 72 03/31/2019 1053   Lab Results  Component Value Date   CHOL 224 (H) 03/31/2019   HDL 46 03/31/2019   LDLCALC 147 (H) 03/31/2019   TRIG 173 (H) 03/31/2019   CHOLHDL 4.9 03/31/2019   No results found for: "HGBA1C" No results  found for: "VITAMINB12" No results found for: "TSH"  MRI Cervical Spine 12/2020 1. 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 2. Mild left foraminal narrowing at C2-3. 3. Moderate right and mild left foraminal narrowing at C3-4. 4. Moderate foraminal narrowing bilaterally at C4-5 and C5-6 is worse on the right. 5. Mild right foraminal narrowing at C6-7. 6. Moderate foraminal narrowing bilaterally at C7-T1 is worse on the left.   MRI Brain 12/2020 1. No acute intracranial abnormality or significant interval change. 2. Stable left frontal encephalomalacia subjacent to the tumor resection. 3. Bilateral exophthalmos   MRI Cervical spine 02/18/2022 1. At C3-4 there is a mild broad-based disc bulge. Bilateral uncovertebral degenerative changes. Moderate right foraminal stenosis. Mild left foraminal stenosis. 2. At C4-5 there is a mild broad-based disc bulge. Mild-moderate bilateral foraminal stenosis. 3. At C5-6 there is a mild broad-based disc bulge. Bilateral uncovertebral degenerative changes. Moderate-severe right foraminal stenosis. Moderate left foraminal stenosis. 4. At C6-7 there is a mild broad-based disc bulge. Mild right foraminal stenosis. 5. At C7-T1 there is a mild broad-based disc bulge. Moderate left foraminal stenosis. 6. No acute osseous injury of the cervical spine.  EMG/NCS 06/08/2022 This is an abnormal study. 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  Abnormal nerve conductions in the left median nerves likely due to prior carpal tunnel syndrome status post decompression as he is asymptomatic for left carpal tunnel syndrome. There is evidence for concomitant severe left greater than right ulnar neuropathy across both elbows with acute/ongoing denervation in the distal ADM muscles and chronic neurogenic changes in the distal FDI muscles The radial sensory nerves were within normal limits suggesting no  polyneuropathy.   No denervation in the proximal muscles or cervical paraspinal muscles to suggest cervical radiculopathy.   ASSESSMENT AND PLAN  62 y.o. year old male with history of hypertension, hyperlipidemia, obesity who is presenting for follow up for cervical radiculopathy and carpal tunnel syndrome.  He is status post left carpal tunnel release surgery and left cubital tunnel release surgery.  Doing better and planning for a right carpal tunnel release surgery early next year.  Currently undergoing chiropractic treatment with improvement of his neck pain.  He is also getting vestibular therapy.  Overall he is stable.  Plan for patient to continue his current medications, continue to follow with Dr. Ophelia Charter and I will follow-up in 6 months or sooner if worse.  He voiced understanding.   1. Bilateral carpal tunnel syndrome   2. Ulnar neuropathy of both upper extremities   3. Cervical radiculopathy   4. Cervicalgia   5. Morbid obesity (HCC)      Patient Instructions  Continue current medications Continue current therapy  Continue to follow-up with PCP return in 6 months or sooner if worse.  No orders of the defined types were placed in this encounter.   No orders of the defined types were placed in this encounter.   Return in about 6 months (around 06/26/2023).  Windell Norfolk, MD 12/26/2022, 8:48 AM  [facility] [address] [place], [place] [address] [phone]

## 2022-12-28 DIAGNOSIS — M47812 Spondylosis without myelopathy or radiculopathy, cervical region: Secondary | ICD-10-CM | POA: Diagnosis not present

## 2022-12-28 DIAGNOSIS — M47816 Spondylosis without myelopathy or radiculopathy, lumbar region: Secondary | ICD-10-CM | POA: Diagnosis not present

## 2022-12-28 DIAGNOSIS — M531 Cervicobrachial syndrome: Secondary | ICD-10-CM | POA: Diagnosis not present

## 2022-12-28 DIAGNOSIS — S233XXA Sprain of ligaments of thoracic spine, initial encounter: Secondary | ICD-10-CM | POA: Diagnosis not present

## 2022-12-28 DIAGNOSIS — M25612 Stiffness of left shoulder, not elsewhere classified: Secondary | ICD-10-CM | POA: Diagnosis not present

## 2022-12-29 ENCOUNTER — Ambulatory Visit (HOSPITAL_COMMUNITY): Payer: 59

## 2022-12-29 DIAGNOSIS — M542 Cervicalgia: Secondary | ICD-10-CM

## 2022-12-29 DIAGNOSIS — R42 Dizziness and giddiness: Secondary | ICD-10-CM

## 2022-12-29 NOTE — Therapy (Signed)
OUTPATIENT PHYSICAL THERAPY VESTIBULAR TREATMENT     Patient Name: Micheal Ross. MRN: [id number] DOB:15-Dec-1960, 62 y.o., male Today's Date: 12/29/2022  END OF SESSION:  PT End of Session - 12/29/22 0958     Visit Number 2    Number of Visits 8    Date for PT Re-Evaluation 01/16/23    Authorization Type [employer]    Authorization Time Period no auth; no vl    Authorization - Visit Number 4    Progress Note Due on Visit 8    PT Start Time [phone]    PT Stop Time 1015    PT Time Calculation (min) 39 min    Activity Tolerance Patient tolerated treatment well    Behavior During Therapy WFL for tasks assessed/performed             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)   Dizziness    Hypercholesterolemia    Hypertension    Neck pain    Shoulder pain    Sleep apnea    uses bipap at home   Past Surgical History:  Procedure Laterality Date   ANKLE SURGERY     BIOPSY  04/21/2022   Procedure: BIOPSY;  Surgeon: Lanelle Bal, DO;  [facility]: [facility];  Service: Endoscopy;;   BIOPSY  09/15/2022   Procedure: BIOPSY;  Surgeon: Lanelle Bal, DO;  [facility]: [facility];  Service: Endoscopy;;   brain tumor removal  2014   CARPAL TUNNEL RELEASE Left 08/15/2021   Procedure: LEFT CARPAL TUNNEL RELEASE;  Surgeon: Eldred Manges, MD;  Location: [facility];  Service: Orthopedics;  Laterality: Left;   CATARACT EXTRACTION W/PHACO Right 10/13/2022   Procedure: CATARACT EXTRACTION PHACO AND INTRAOCULAR LENS PLACEMENT (IOC);  Surgeon: Pecolia Ades, MD;  Location: [facility];  Service: Ophthalmology;  Laterality: Right;  CDE 2.87   CATARACT EXTRACTION W/PHACO Left 10/27/2022   Procedure: CATARACT EXTRACTION PHACO AND INTRAOCULAR LENS PLACEMENT (IOC);  Surgeon: Pecolia Ades, MD;  Location: [facility];  Service: Ophthalmology;  Laterality: Left;  CDE 3.07   COLONOSCOPY WITH PROPOFOL  N/A 01/13/2019   Procedure: COLONOSCOPY WITH PROPOFOL;  Surgeon: Corbin Ade, MD;  20 mm polyp found in the cecum that was removed via piecemeal hot snare polypectomy. Repeat in 4 months. path tubulovillous adenoma, negative for high-grade dysplasia   COLONOSCOPY WITH PROPOFOL N/A 01/20/2019   Procedure: COLONOSCOPY WITH PROPOFOL;  Surgeon: West Bali, MD; large defect (2 cm x 1.5 cm with small visible vessel with stigmata of recent bleed) in the cecum.  3 hemostatic clips placed (MR conditional).  One 3 mm polyp in the cecum, tortuous colon, external and internal hemorrhoids.  Pathology revealed tubular adenoma.   COLONOSCOPY WITH PROPOFOL N/A 10/06/2021   Procedure: COLONOSCOPY WITH PROPOFOL;  Surgeon: Lanelle Bal, DO;  [facility]: [facility];  Service: Endoscopy;  Laterality: N/A;  7:30am   ESOPHAGOGASTRODUODENOSCOPY (EGD) WITH PROPOFOL N/A 04/21/2022   Procedure: ESOPHAGOGASTRODUODENOSCOPY (EGD) WITH PROPOFOL;  Surgeon: Lanelle Bal, DO;  [facility];  Service: Endoscopy;  Laterality: N/A;  145pm, asa 3, pt can't come earlier - transportation   ESOPHAGOGASTRODUODENOSCOPY (EGD) WITH PROPOFOL N/A 09/15/2022   Procedure: ESOPHAGOGASTRODUODENOSCOPY (EGD) WITH PROPOFOL;  Surgeon: Lanelle Bal, DO;  [facility];  Service: Endoscopy;  Laterality: N/A;  8:00 am , asa 3   HERNIA  REPAIR  2010   umbilical   NASAL SEPTOPLASTY W/ TURBINOPLASTY Bilateral 11/30/2020   Procedure: NASAL SEPTOPLASTY WITH BILATERAL  TURBINATE REDUCTION;  Surgeon: Newman Pies, MD;  Location: [facility];  Service: ENT;  Laterality: Bilateral;   POLYPECTOMY  01/13/2019   Procedure: POLYPECTOMY;  Surgeon: Corbin Ade, MD;  [facility];  Service: Endoscopy;;  Large Cecal Polyp    POLYPECTOMY  10/06/2021   Procedure: POLYPECTOMY;  Surgeon: Lanelle Bal, DO;  [facility]: [facility];  Service: Endoscopy;;   POLYPECTOMY  09/15/2022   Procedure: POLYPECTOMY;   Surgeon: Lanelle Bal, DO;  [facility];  Service: Endoscopy;;   SINOSCOPY     ULNAR TUNNEL RELEASE Left 07/19/2022   Procedure: LEFT CUBITAL TUNNEL RELEASE;  Surgeon: Eldred Manges, MD;  Location: [facility];  Service: Orthopedics;  Laterality: Left;   Patient Active Problem List   Diagnosis Date Noted   Cubital tunnel syndrome on left 06/29/2022   Carpal tunnel syndrome of right wrist 06/12/2022   Ulnar neuropathy at elbow 06/12/2022   Low back pain 10/13/2021   Carpal tunnel syndrome, left upper limb 04/28/2021   Bee sting 12/05/2019   Obstructive sleep apnea 03/31/2019   Acute blood loss anemia 01/20/2019   Tachycardia 01/20/2019   Lower GI bleed    Weakness of both lower extremities 01/15/2019   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 Kirstie Peri REFERRING PROVIDER: Kirstie Peri, MD  REFERRING DIAG: VERTIGO  THERAPY DIAG:  Dizziness and giddiness  Cervicalgia  ONSET DATE: November 2021  Rationale for Evaluation and Treatment: Rehabilitation  SUBJECTIVE:   SUBJECTIVE STATEMENT:  Saw neurologist yesterday; states neurologist feels that his dizziness could be related to when brain tumor was removed 10 years ago or could be related to fluid behind his sinuses.  Does feel he is walking better  Well known to this clinic; treated recently for neck pain; last visit here 09/06/22; last subjective; "Seeing a chiropractor for dizziness as well and some adjustments for his neck. 3/10 pain this morning.  No change in symptoms since start of therapy."  Patient reports dizziness has been ongoing since November 2021 before his neck symptoms started; Went to the ED back in Nov 2021 and was given meclinizine at that  time although he is not longer taking it; dizziness is constant but increases if I turn my head quickly or walk too fast. Can also get dizzy when watching TV if the action on the screen moves too much. Also feels off balance if trying to stand in one place.    Cataracts removed last month so wearing sunglasses.  12/26/22 will be seeing neurologist and having another MRI; MD states it may also be allergy related?     EVAL:Pt reporting that he has been having neck pain for approximately 2 years.  He reported that his neck pain is primarily in his left neck and left upper shoulder blade.  He also reported left mass that started occurring in January of this year and he has seen 3 doctors have regarded the mass as just "fat."  Patient states that the mass will be bigger in the morning get smaller throughout the day. 5-6/10 currently, previously 8-9/10. Reports sharp, sharp  pain. Hand dominance: Right Pt accompanied by: self  PERTINENT HISTORY: brain tumor 2014; left hand carpal tunnel surgery; elbow surgery last year  PAIN:  Are you having pain? Yes: NPRS scale: 3/10 Pain location: neck Pain description: constant Aggravating factors: unknown Relieving factors: chiropractor, heat  PRECAUTIONS: None  RED FLAGS: Denies numbness and tingling anywhere   WEIGHT BEARING RESTRICTIONS: No  FALLS: Has patient fallen in last 6 months? No   PLOF: Independent  PATIENT GOALS: no more dizziness  OBJECTIVE:  Note: Objective measures were completed at Evaluation unless otherwise noted.  DIAGNOSTIC FINDINGS: will have MRI upcoming  COGNITION: Overall cognitive status: Within functional limits for tasks assessed   SENSATION: wfl  EDEMA:  Still has some swelling left side of the neck/upper trap area   POSTURE:  rounded shoulders and forward head  Cervical ROM:  grossly wfl  Active AROM (deg) eval  Flexion   Extension   Right lateral flexion   Left lateral flexion   Right rotation    Left rotation   (Blank rows = not tested)  STRENGTH: not tested   BED MOBILITY:  Not tested  TRANSFERS: Assistive device utilized: None  Sit to stand: Modified independence Stand to sit: Modified independence Chair to chair:  not tested Floor:  not tested  GAIT: Gait pattern:  decreased gait speed and wide BOS Distance walked: 50 ft in clinic Assistive device utilized: None Level of assistance: Modified independence Comments: no complaint of back pain  FUNCTIONAL TESTS:  SLS 5" right; 4" left 5 times sit to stand: next visit   PATIENT SURVEYS:  DHI 90 (54+ is severe)  VESTIBULAR ASSESSMENT:  GENERAL OBSERVATION: wearing sunglasses   SYMPTOM BEHAVIOR:  Subjective history: see above  Non-Vestibular symptoms: neck pain  Type of dizziness: Imbalance (Disequilibrium), Spinning/Vertigo, and Unsteady with head/body turns  Frequency: constant  Duration: constant  but increases with movement  Aggravating factors: Induced by position change: supine to sit, Induced by motion: occur when walking, bending down to the ground, turning body quickly, and turning head quickly, and Worse in the morning  Relieving factors: no known relieving factors  Progression of symptoms: better  OCULOMOTOR EXAM:  Ocular Alignment: normal; right eye squints some possibly due to light  Ocular ROM: No Limitations  Spontaneous Nystagmus: absent  Gaze-Induced Nystagmus: absent  Smooth Pursuits: intact  Saccades: intact  Convergence/Divergence: 6 inches   VESTIBULAR - OCULAR REFLEX:   Slow VOR: Normal  VOR Cancellation: Normal states "background bothers me"  Head-Impulse Test: HIT Right: negative HIT Left: negative  Dynamic Visual Acuity:  not tested   POSITIONAL TESTING: Other: not tested on eval  MOTION SENSITIVITY:  Motion Sensitivity Quotient Intensity: 0 = none, 1 = Lightheaded, 2 = Mild, 3 = Moderate, 4 = Severe, 5 = Vomiting  Intensity  1. Sitting to supine   2. Supine to L  side   3. Supine to R side   4. Supine to sitting   5. L Hallpike-Dix   6. Up from L    7. R Hallpike-Dix   8. Up from R    9. Sitting, head tipped to L knee 2  10. Head up from L knee 2  11. Sitting, head tipped to R knee 3  12. Head up from R knee 3  13. Sitting head turns x5 3  14.Sitting head nods x5 4  15. In stance, 180 turn to L    16. In stance, 180 turn to R  OTHOSTATICS: not done  VESTIBULAR TREATMENT:                                                                                                   DATE:  12/29/22 Seated:  VOR cancellation, identifying numbers x 2 lateral x 2 vertical  Calf stretch 5 x 20" each  AROM cervical spine x 10 Nustep seat 12 x 5' Standing heel raises 2 x 10   12/25/22 Review of goals NuStep, seat 12, level 1 x 5' Seated calf stretch x 30"  Cervical rotation AROM X 10 on each Patient education on neuroplasticity and vestibular system Active VBI testing (negative on B sides) Modified Dix-Hallpike and supine roll test (both are negative, no nystagmus and no vertigo reported) Oculomotor reflex testing (c/o dizziness, L > R) Seated:  Horizontal saccades, identifying numbers x 1'  VOR 1, identifying numbers x 1'  VOR cancellation, identifying numbers x 1'  12/19/22 physical therapy evaluation and HEP intruction Exercises - Seated Gaze Stabilization with Head Rotation  - 2-3 x daily - 7 x weekly - 1 sets - 10 reps - Seated Gaze Stabilization with Head Nod  - 2-3 x daily - 7 x weekly - 1 sets - 10 reps  Canalith Repositioning:   Gaze Adaptation:   Habituation:   PATIENT EDUCATION: Education details: Patient educated on exam findings, POC, scope of PT, HEP, and what to expect next visit. Person educated: Patient Education method: Explanation, Demonstration, and Handouts Education comprehension: verbalized understanding, returned demonstration, verbal cues required, and tactile cues required  HOME EXERCISE PROGRAM: HOME  EXERCISES FOR YOUR DIZZINESS Do these 2x/day, 7 days/week Do these in sitting GAZE STABILIZATION EXERCISES These exercises are designed to improve your eyes' ability to track objects without getting dizzy. When performing these exercises, make sure you are facing a blank wall (like a white wall) to avoid distraction. You can wear your glasses/contact lenses if you must.   Before anything, warm-up your neck muscles by bending it forward and back 10 times, side-to-side 10 times, and slowly turn your head left and right 10 times.  Exercise 1 (Horizontal Saccades) Use two cards for this exercise While holding the cards on each hand side by side in sitting with arms outstretched, look at the first item on the other card then look at the next item on the second card then alternate your gaze sequentially. Do NOT move your cards nor the your head. Do the following for one minute each: Identifying numbers Reading words  Exercise 2 (Paradigm x1) Use only one card for this exercise While holding the card in sitting with your arm outstretched, turn your head left and right at your own pace while keeping the card stationary.  Do this for one minute each while you are: Identifying numbers Reading words  Exercise 4 (VOR cancellation) Use only one card for this exercise While holding the card in sitting with your arms outstretched, turn your body, arm, and head at the same time left and right at your own pace Do this for one minute each while you are: Identifying numbers  Reading words  Access Code: [email or web address] URL: [email or web address] Date: 12/25/2022 Prepared by: Krystal Clark  Exercises - Seated Calf Stretch with Strap  - 1-2 x daily - 7 x weekly - 3 reps - 30 hold  Access Code: [email or web address] URL: [email or web address] Date: 12/19/2022 Prepared by: [facility]  Exercises - Seated Gaze Stabilization with Head Rotation  - 2-3 x daily - 7 x weekly - 1 sets - 10  reps - Seated Gaze Stabilization with Head Nod  - 2-3 x daily - 7 x weekly - 1 sets - 10 reps   GOALS: Goals reviewed with patient? Yes  SHORT TERM GOALS: Target date: 01/02/2023  patient will be independent with initial HEP  Baseline: Goal status: INITIAL  2.  Patient will self report 50% improvement to improve tolerance for functional activity  Baseline:  Goal status: INITIAL  LONG TERM GOALS: Target date: 01/16/2023  Patient will be independent in self management strategies to improve quality of life and functional outcomes.  Baseline:  Goal status: INITIAL  2.  Patient will self report 75% improvement to improve tolerance for functional activity  Baseline:  Goal status: INITIAL  3.  Patient will improve score on DHI by 40 points (50 ) Baseline: 90 Goal status: INITIAL  4.  Patient will be able to watch TV x 1 hour without complaints of dizziness Baseline:  Goal status: INITIAL  5.  Patient will be able to stand SLS x 10" each to demonstrate improved functional balance Baseline: 5" right; 4" left Goal status: INITIAL   ASSESSMENT:  CLINICAL IMPRESSION: Today continued with VOR cancellation exercises.  Noted continued dizziness with VOR cancellation exercises 6/10.  Noted dizziness with vertical movement as well.  Encouraged patient to continued with HEP.   To date, skilled PT is required to address the impairments and improve function.  EVAL: Patient is a 62 y.o. male who was seen today for physical therapy evaluation and treatment for VERTIGO. Patient demonstrates increased vestibular symptoms with provocative testing which is negatively impacting patient ability to perform ADLs and functional mobility tasks. Patient will benefit from skilled physical therapy services to address these deficits to improve level of function with ADLs, functional mobility tasks, and reduce risk for falls.    OBJECTIVE IMPAIRMENTS: decreased activity tolerance, decreased balance,  decreased knowledge of condition, dizziness, impaired perceived functional ability, and pain.   ACTIVITY LIMITATIONS: standing, sleeping, bed mobility, and locomotion level  PARTICIPATION LIMITATIONS: meal prep, cleaning, laundry, driving, shopping, and community activity  REHAB POTENTIAL: Good  CLINICAL DECISION MAKING: Evolving/moderate complexity  EVALUATION COMPLEXITY: Moderate   PLAN:  PT FREQUENCY: 2x/week  PT DURATION: 4 weeks  PLANNED INTERVENTIONS: Therapeutic exercises, Therapeutic activity, Neuromuscular re-education, Balance training, Gait training, Patient/Family education, Joint manipulation, Joint mobilization, Stair training, Orthotic/Fit training, DME instructions, Aquatic Therapy, Dry Needling, Electrical stimulation, Spinal manipulation, Spinal mobilization, Cryotherapy, Moist heat, Compression bandaging, scar mobilization, Splintting, Taping, Traction, Ultrasound, Ionotophoresis 4mg /ml Dexamethasone, and Manual therapy   PLAN FOR NEXT SESSION: progress gaze stabilization as able; balance; history of cervical issues so question possible cervical in origin?also is seeing a chiropractor 2 x a week.    10:17 AM, 12/29/22 Laniah Grimm Small Alphonse Asbridge MPT [facility] [place] [phone]

## 2023-01-02 DIAGNOSIS — M25612 Stiffness of left shoulder, not elsewhere classified: Secondary | ICD-10-CM | POA: Diagnosis not present

## 2023-01-02 DIAGNOSIS — M531 Cervicobrachial syndrome: Secondary | ICD-10-CM | POA: Diagnosis not present

## 2023-01-02 DIAGNOSIS — S233XXA Sprain of ligaments of thoracic spine, initial encounter: Secondary | ICD-10-CM | POA: Diagnosis not present

## 2023-01-02 DIAGNOSIS — M47812 Spondylosis without myelopathy or radiculopathy, cervical region: Secondary | ICD-10-CM | POA: Diagnosis not present

## 2023-01-02 DIAGNOSIS — M47816 Spondylosis without myelopathy or radiculopathy, lumbar region: Secondary | ICD-10-CM | POA: Diagnosis not present

## 2023-01-03 ENCOUNTER — Ambulatory Visit (HOSPITAL_COMMUNITY): Payer: 59

## 2023-01-03 DIAGNOSIS — R42 Dizziness and giddiness: Secondary | ICD-10-CM | POA: Diagnosis not present

## 2023-01-03 DIAGNOSIS — M542 Cervicalgia: Secondary | ICD-10-CM

## 2023-01-03 DIAGNOSIS — R293 Abnormal posture: Secondary | ICD-10-CM

## 2023-01-03 NOTE — Therapy (Signed)
OUTPATIENT PHYSICAL THERAPY VESTIBULAR TREATMENT     Patient Name: Micheal Ross. MRN: [id number] DOB:[birth year], 62 y.o., male Today's Date: 01/03/2023  END OF SESSION:  PT End of Session - 01/03/23 0915     Visit Number 3    Number of Visits 8    Date for PT Re-Evaluation 01/16/23    Authorization Type [employer]    Authorization Time Period no auth; no vl    Authorization - Visit Number 5    Progress Note Due on Visit 8    PT Start Time 0920    PT Stop Time 1000    PT Time Calculation (min) 40 min    Activity Tolerance Patient tolerated treatment well    Behavior During Therapy WFL for tasks assessed/performed             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)   Dizziness    Hypercholesterolemia    Hypertension    Neck pain    Shoulder pain    Sleep apnea    uses bipap at home   Past Surgical History:  Procedure Laterality Date   ANKLE SURGERY     BIOPSY  04/21/2022   Procedure: BIOPSY;  Surgeon: Lanelle Bal, DO;  [facility];  Service: Endoscopy;;   BIOPSY  09/15/2022   Procedure: BIOPSY;  Surgeon: Lanelle Bal, DO;  [facility];  Service: Endoscopy;;   brain tumor removal  2014   CARPAL TUNNEL RELEASE Left 08/15/2021   Procedure: LEFT CARPAL TUNNEL RELEASE;  Surgeon: Eldred Manges, MD;  Location: [facility];  Service: Orthopedics;  Laterality: Left;   CATARACT EXTRACTION W/PHACO Right 10/13/2022   Procedure: CATARACT EXTRACTION PHACO AND INTRAOCULAR LENS PLACEMENT (IOC);  Surgeon: Pecolia Ades, MD;  Location: [facility];  Service: Ophthalmology;  Laterality: Right;  CDE 2.87   CATARACT EXTRACTION W/PHACO Left 10/27/2022   Procedure: CATARACT EXTRACTION PHACO AND INTRAOCULAR LENS PLACEMENT (IOC);  Surgeon: Pecolia Ades, MD;  Location: [facility];  Service: Ophthalmology;  Laterality: Left;  CDE 3.07   COLONOSCOPY WITH PROPOFOL  N/A 01/13/2019   Procedure: COLONOSCOPY WITH PROPOFOL;  Surgeon: Corbin Ade, MD;  20 mm polyp found in the cecum that was removed via piecemeal hot snare polypectomy. Repeat in 4 months. path tubulovillous adenoma, negative for high-grade dysplasia   COLONOSCOPY WITH PROPOFOL N/A 01/20/2019   Procedure: COLONOSCOPY WITH PROPOFOL;  Surgeon: West Bali, MD; large defect (2 cm x 1.5 cm with small visible vessel with stigmata of recent bleed) in the cecum.  3 hemostatic clips placed (MR conditional).  One 3 mm polyp in the cecum, tortuous colon, external and internal hemorrhoids.  Pathology revealed tubular adenoma.   COLONOSCOPY WITH PROPOFOL N/A 10/06/2021   Procedure: COLONOSCOPY WITH PROPOFOL;  Surgeon: Lanelle Bal, DO;  [facility];  Service: Endoscopy;  Laterality: N/A;  7:30am   ESOPHAGOGASTRODUODENOSCOPY (EGD) WITH PROPOFOL N/A 04/21/2022   Procedure: ESOPHAGOGASTRODUODENOSCOPY (EGD) WITH PROPOFOL;  Surgeon: Lanelle Bal, DO;  [facility];  Service: Endoscopy;  Laterality: N/A;  145pm, asa 3, pt can't come earlier - transportation   ESOPHAGOGASTRODUODENOSCOPY (EGD) WITH PROPOFOL N/A 09/15/2022   Procedure: ESOPHAGOGASTRODUODENOSCOPY (EGD) WITH PROPOFOL;  Surgeon: Lanelle Bal, DO;  [facility];  Service: Endoscopy;  Laterality: N/A;  8:00 am , asa 3   HERNIA  REPAIR  2010   umbilical   NASAL SEPTOPLASTY W/ TURBINOPLASTY Bilateral 11/30/2020   Procedure: NASAL SEPTOPLASTY WITH BILATERAL  TURBINATE REDUCTION;  Surgeon: Newman Pies, MD;  Location: [facility];  Service: ENT;  Laterality: Bilateral;   POLYPECTOMY  01/13/2019   Procedure: POLYPECTOMY;  Surgeon: Corbin Ade, MD;  [facility];  Service: Endoscopy;;  Large Cecal Polyp    POLYPECTOMY  10/06/2021   Procedure: POLYPECTOMY;  Surgeon: Lanelle Bal, DO;  [facility];  Service: Endoscopy;;   POLYPECTOMY  09/15/2022   Procedure: POLYPECTOMY;   Surgeon: Lanelle Bal, DO;  [facility];  Service: Endoscopy;;   SINOSCOPY     ULNAR TUNNEL RELEASE Left 07/19/2022   Procedure: LEFT CUBITAL TUNNEL RELEASE;  Surgeon: Eldred Manges, MD;  Location:  SURGERY CENTER;  Service: Orthopedics;  Laterality: Left;   Patient Active Problem List   Diagnosis Date Noted   Cubital tunnel syndrome on left 06/29/2022   Carpal tunnel syndrome of right wrist 06/12/2022   Ulnar neuropathy at elbow 06/12/2022   Low back pain 10/13/2021   Carpal tunnel syndrome, left upper limb 04/28/2021   Bee sting 12/05/2019   Obstructive sleep apnea 03/31/2019   Acute blood loss anemia 01/20/2019   Tachycardia 01/20/2019   Lower GI bleed    Weakness of both lower extremities 01/15/2019   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 Kirstie Peri REFERRING PROVIDER: Kirstie Peri, MD  REFERRING DIAG: VERTIGO  THERAPY DIAG:  Dizziness and giddiness  Cervicalgia  Abnormal posture  ONSET DATE: November 2021  Rationale for Evaluation and Treatment: Rehabilitation  SUBJECTIVE:   SUBJECTIVE STATEMENT:  Feels he is moving a little better; still does not like looking up; watching TV is still hard for him sometimes when the background gets really busy.  He is having some transportation issues; [facility] will only take him to 2 x a week; starting taking allergy shots 2 x a week so MD thinks allergies are playing a role in dizziness.  He may have to switch his PT to somewhere closer to home due to transportation issues.    Well known to this clinic; treated recently for neck pain; last visit here 09/06/22; last subjective; "Seeing a chiropractor for dizziness as well and some adjustments for his neck. 3/10 pain  this morning.  No change in symptoms since start of therapy."  Patient reports dizziness has been ongoing since November 2021 before his neck symptoms started; Went to the ED back in Nov 2021 and was given meclinizine at that time although he is not longer taking it; dizziness is constant but increases if I turn my head quickly or walk too fast. Can also get dizzy when watching TV if the action on the screen moves too much. Also feels off balance if trying to stand in one place.    Cataracts removed last month so wearing sunglasses.  12/26/22 will be seeing neurologist and having another MRI; MD states it may also be allergy related?     EVAL:Pt reporting that he has been having neck pain for approximately 2 years.  He reported that his neck pain is primarily in his left neck and left upper shoulder blade.  He also reported  left mass that started occurring in January of this year and he has seen 3 doctors have regarded the mass as just "fat."  Patient states that the mass will be bigger in the morning get smaller throughout the day. 5-6/10 currently, previously 8-9/10. Reports sharp, sharp pain. Hand dominance: Right Pt accompanied by: self  PERTINENT HISTORY: brain tumor 2014; left hand carpal tunnel surgery; elbow surgery last year  PAIN:  Are you having pain? Yes: NPRS scale: 3/10 Pain location: neck Pain description: constant Aggravating factors: unknown Relieving factors: chiropractor, heat  PRECAUTIONS: None  RED FLAGS: Denies numbness and tingling anywhere   WEIGHT BEARING RESTRICTIONS: No  FALLS: Has patient fallen in last 6 months? No   PLOF: Independent  PATIENT GOALS: no more dizziness  OBJECTIVE:  Note: Objective measures were completed at Evaluation unless otherwise noted.  DIAGNOSTIC FINDINGS: will have MRI upcoming  COGNITION: Overall cognitive status: Within functional limits for tasks assessed   SENSATION: wfl  EDEMA:  Still has some swelling left side of  the neck/upper trap area   POSTURE:  rounded shoulders and forward head  Cervical ROM:  grossly wfl  Active AROM (deg) eval  Flexion   Extension   Right lateral flexion   Left lateral flexion   Right rotation   Left rotation   (Blank rows = not tested)  STRENGTH: not tested   BED MOBILITY:  Not tested  TRANSFERS: Assistive device utilized: None  Sit to stand: Modified independence Stand to sit: Modified independence Chair to chair:  not tested Floor:  not tested  GAIT: Gait pattern:  decreased gait speed and wide BOS Distance walked: 50 ft in clinic Assistive device utilized: None Level of assistance: Modified independence Comments: no complaint of back pain  FUNCTIONAL TESTS:  SLS 5" right; 4" left 5 times sit to stand: next visit   PATIENT SURVEYS:  DHI 90 (54+ is severe)  VESTIBULAR ASSESSMENT:  GENERAL OBSERVATION: wearing sunglasses   SYMPTOM BEHAVIOR:  Subjective history: see above  Non-Vestibular symptoms: neck pain  Type of dizziness: Imbalance (Disequilibrium), Spinning/Vertigo, and Unsteady with head/body turns  Frequency: constant  Duration: constant  but increases with movement  Aggravating factors: Induced by position change: supine to sit, Induced by motion: occur when walking, bending down to the ground, turning body quickly, and turning head quickly, and Worse in the morning  Relieving factors: no known relieving factors  Progression of symptoms: better  OCULOMOTOR EXAM:  Ocular Alignment: normal; right eye squints some possibly due to light  Ocular ROM: No Limitations  Spontaneous Nystagmus: absent  Gaze-Induced Nystagmus: absent  Smooth Pursuits: intact  Saccades: intact  Convergence/Divergence: 6 inches   VESTIBULAR - OCULAR REFLEX:   Slow VOR: Normal  VOR Cancellation: Normal states "background bothers me"  Head-Impulse Test: HIT Right: negative HIT Left: negative  Dynamic Visual Acuity:  not tested   POSITIONAL TESTING:  Other: not tested on eval  MOTION SENSITIVITY:  Motion Sensitivity Quotient Intensity: 0 = none, 1 = Lightheaded, 2 = Mild, 3 = Moderate, 4 = Severe, 5 = Vomiting  Intensity  1. Sitting to supine   2. Supine to L side   3. Supine to R side   4. Supine to sitting   5. L Hallpike-Dix   6. Up from L    7. R Hallpike-Dix   8. Up from R    9. Sitting, head tipped to L knee 2  10. Head up from L knee 2  11. Sitting, head  tipped to R knee 3  12. Head up from R knee 3  13. Sitting head turns x5 3  14.Sitting head nods x5 4  15. In stance, 180 turn to L    16. In stance, 180 turn to R     OTHOSTATICS: not done  VESTIBULAR TREATMENT:                                                                                                   DATE:  01/03/23 Nustep seat 12 x 5' level 3 dynamic warm up Standing: Heel raises 2 x 10 Slant board 5 x 20" Tandem stance 3 x 10" each no UE assist Small bos with head nods and turns x 10 each with no UE assist Seated: Cervical AROM x 10 each flexion,  extension, rotation, sidebending  VOR cancellation reading words; to the right and to the left reading across then up and down 2 cards with numbers head turns with reading numbers 2 cards without head movement (saccades) reading numbers   12/29/22 Seated:  VOR cancellation, identifying numbers x 2 lateral x 2 vertical  Calf stretch 5 x 20" each  AROM cervical spine x 10 Nustep seat 12 x 5' Standing heel raises 2 x 10   12/25/22 Review of goals NuStep, seat 12, level 1 x 5' Seated calf stretch x 30"  Cervical rotation AROM X 10 on each Patient education on neuroplasticity and vestibular system Active VBI testing (negative on B sides) Modified Dix-Hallpike and supine roll test (both are negative, no nystagmus and no vertigo reported) Oculomotor reflex testing (c/o dizziness, L > R) Seated:  Horizontal saccades, identifying numbers x 1'  VOR 1, identifying numbers x 1'  VOR cancellation,  identifying numbers x 1'  12/19/22 physical therapy evaluation and HEP intruction Exercises - Seated Gaze Stabilization with Head Rotation  - 2-3 x daily - 7 x weekly - 1 sets - 10 reps - Seated Gaze Stabilization with Head Nod  - 2-3 x daily - 7 x weekly - 1 sets - 10 reps  Canalith Repositioning:   Gaze Adaptation:   Habituation:   PATIENT EDUCATION: Education details: Patient educated on exam findings, POC, scope of PT, HEP, and what to expect next visit. Person educated: Patient Education method: Explanation, Demonstration, and Handouts Education comprehension: verbalized understanding, returned demonstration, verbal cues required, and tactile cues required  HOME EXERCISE PROGRAM: HOME EXERCISES FOR YOUR DIZZINESS Do these 2x/day, 7 days/week Do these in sitting GAZE STABILIZATION EXERCISES These exercises are designed to improve your eyes' ability to track objects without getting dizzy. When performing these exercises, make sure you are facing a blank wall (like a white wall) to avoid distraction. You can wear your glasses/contact lenses if you must.   Before anything, warm-up your neck muscles by bending it forward and back 10 times, side-to-side 10 times, and slowly turn your head left and right 10 times.  Exercise 1 (Horizontal Saccades) Use two cards for this exercise While holding the cards on each hand side by side in sitting with arms outstretched, look at the first  item on the other card then look at the next item on the second card then alternate your gaze sequentially. Do NOT move your cards nor the your head. Do the following for one minute each: Identifying numbers Reading words  Exercise 2 (Paradigm x1) Use only one card for this exercise While holding the card in sitting with your arm outstretched, turn your head left and right at your own pace while keeping the card stationary.  Do this for one minute each while you are: Identifying numbers Reading  words  Exercise 4 (VOR cancellation) Use only one card for this exercise While holding the card in sitting with your arms outstretched, turn your body, arm, and head at the same time left and right at your own pace Do this for one minute each while you are: Identifying numbers Reading words  Access Code: [email or web address] URL: [email or web address] Date: 12/25/2022 Prepared by: Krystal Clark  Exercises - Seated Calf Stretch with Strap  - 1-2 x daily - 7 x weekly - 3 reps - 30 hold  Access Code: [email or web address] URL: [email or web address] Date: 12/19/2022 Prepared by: [facility]  Exercises - Seated Gaze Stabilization with Head Rotation  - 2-3 x daily - 7 x weekly - 1 sets - 10 reps - Seated Gaze Stabilization with Head Nod  - 2-3 x daily - 7 x weekly - 1 sets - 10 reps   GOALS: Goals reviewed with patient? Yes  SHORT TERM GOALS: Target date: 01/02/2023  patient will be independent with initial HEP  Baseline: Goal status: INITIAL  2.  Patient will self report 50% improvement to improve tolerance for functional activity  Baseline:  Goal status: INITIAL  LONG TERM GOALS: Target date: 01/16/2023  Patient will be independent in self management strategies to improve quality of life and functional outcomes.  Baseline:  Goal status: INITIAL  2.  Patient will self report 75% improvement to improve tolerance for functional activity  Baseline:  Goal status: INITIAL  3.  Patient will improve score on DHI by 40 points (50 ) Baseline: 90 Goal status: INITIAL  4.  Patient will be able to watch TV x 1 hour without complaints of dizziness Baseline:  Goal status: INITIAL  5.  Patient will be able to stand SLS x 10" each to demonstrate improved functional balance Baseline: 5" right; 4" left Goal status: INITIAL   ASSESSMENT:  CLINICAL IMPRESSION: Focus today on balance; habituation exercises.  Patient with general decreased conditioning so discussed with  him at length the benefits of exercise.  Noted difficulty maintaining balance with head turns and nods; has to use hands to assist to maintain balance.  Seems to have more difficulty with turning his head to the left.  He feels this is likely due to initially having issues with turning his head to the left with cervical issues.  To date, skilled PT is required to address the impairments and improve function.  EVAL: Patient is a 62 y.o. male who was seen today for physical therapy evaluation and treatment for VERTIGO. Patient demonstrates increased vestibular symptoms with provocative testing which is negatively impacting patient ability to perform ADLs and functional mobility tasks. Patient will benefit from skilled physical therapy services to address these deficits to improve level of function with ADLs, functional mobility tasks, and reduce risk for falls.    OBJECTIVE IMPAIRMENTS: decreased activity tolerance, decreased balance, decreased knowledge of condition, dizziness, impaired perceived functional ability, and pain.   ACTIVITY LIMITATIONS: standing, sleeping, bed mobility, and locomotion  level  PARTICIPATION LIMITATIONS: meal prep, cleaning, laundry, driving, shopping, and community activity  REHAB POTENTIAL: Good  CLINICAL DECISION MAKING: Evolving/moderate complexity  EVALUATION COMPLEXITY: Moderate   PLAN:  PT FREQUENCY: 2x/week  PT DURATION: 4 weeks  PLANNED INTERVENTIONS: Therapeutic exercises, Therapeutic activity, Neuromuscular re-education, Balance training, Gait training, Patient/Family education, Joint manipulation, Joint mobilization, Stair training, Orthotic/Fit training, DME instructions, Aquatic Therapy, Dry Needling, Electrical stimulation, Spinal manipulation, Spinal mobilization, Cryotherapy, Moist heat, Compression bandaging, scar mobilization, Splintting, Taping, Traction, Ultrasound, Ionotophoresis 4mg /ml Dexamethasone, and Manual therapy   PLAN FOR NEXT  SESSION: progress gaze stabilization as able; balance; history of cervical issues so question possible cervical in origin?also is seeing a chiropractor 2 x a week.    10:06 AM, 01/03/23 Daoud Lobue Small Renda Pohlman MPT [facility] [place] [phone]

## 2023-01-04 ENCOUNTER — Ambulatory Visit: Payer: 59 | Admitting: Internal Medicine

## 2023-01-04 DIAGNOSIS — M47816 Spondylosis without myelopathy or radiculopathy, lumbar region: Secondary | ICD-10-CM | POA: Diagnosis not present

## 2023-01-04 DIAGNOSIS — S233XXA Sprain of ligaments of thoracic spine, initial encounter: Secondary | ICD-10-CM | POA: Diagnosis not present

## 2023-01-04 DIAGNOSIS — M25612 Stiffness of left shoulder, not elsewhere classified: Secondary | ICD-10-CM | POA: Diagnosis not present

## 2023-01-04 DIAGNOSIS — M47812 Spondylosis without myelopathy or radiculopathy, cervical region: Secondary | ICD-10-CM | POA: Diagnosis not present

## 2023-01-04 DIAGNOSIS — M531 Cervicobrachial syndrome: Secondary | ICD-10-CM | POA: Diagnosis not present

## 2023-01-05 ENCOUNTER — Encounter (HOSPITAL_COMMUNITY): Payer: Self-pay

## 2023-01-05 ENCOUNTER — Encounter (HOSPITAL_COMMUNITY): Payer: 59

## 2023-01-05 NOTE — Therapy (Signed)
[facility] at [facility] [address] [place], [place], [address] Phone: [phone]   Fax:  [phone]  Patient Details  Name: Micheal Ross. MRN: [id number] Date of Birth: [birth year] Referring Provider:  No ref. provider found  Encounter Date: 01/05/2023 PHYSICAL THERAPY DISCHARGE SUMMARY  Visits from Start of Care: 4  Current functional level related to goals / functional outcomes: unknown   Remaining deficits: unknown   Education / Equipment: HEP   Patient agrees to discharge. Patient goals were not met. Patient is being discharged due to  transportation issues; will be attending therapy at a clinic closer to home.   [facility] at [facility] [address] [place], [place], [address] Phone: [phone]   Fax:  [phone]

## 2023-01-08 ENCOUNTER — Ambulatory Visit (INDEPENDENT_AMBULATORY_CARE_PROVIDER_SITE_OTHER): Payer: 59

## 2023-01-08 DIAGNOSIS — J309 Allergic rhinitis, unspecified: Secondary | ICD-10-CM | POA: Diagnosis not present

## 2023-01-08 NOTE — Progress Notes (Signed)
Immunotherapy   Patient Details  Name: Micheal Ross. MRN: [id number] Date of Birth: Feb 14, 1961  01/08/2023  [name]. started injections for  molds, and trees.  Following schedule: C  Frequency:1 time per week Epi-Pen:Epi-Pen Available  Consent signed consent in office today and patient instructions given. Patient sat in the lobby for over thirty minutes without an issue.    Ralene Muskrat 01/08/2023, 10:15 AM

## 2023-01-10 ENCOUNTER — Encounter (HOSPITAL_COMMUNITY): Payer: 59

## 2023-01-12 ENCOUNTER — Encounter (HOSPITAL_COMMUNITY): Payer: 59

## 2023-01-17 ENCOUNTER — Ambulatory Visit (INDEPENDENT_AMBULATORY_CARE_PROVIDER_SITE_OTHER): Payer: Self-pay

## 2023-01-17 ENCOUNTER — Encounter (HOSPITAL_COMMUNITY): Payer: 59

## 2023-01-17 DIAGNOSIS — J309 Allergic rhinitis, unspecified: Secondary | ICD-10-CM | POA: Diagnosis not present

## 2023-01-19 ENCOUNTER — Encounter (HOSPITAL_COMMUNITY): Payer: 59

## 2023-01-24 ENCOUNTER — Ambulatory Visit (INDEPENDENT_AMBULATORY_CARE_PROVIDER_SITE_OTHER): Payer: 59

## 2023-01-24 DIAGNOSIS — J309 Allergic rhinitis, unspecified: Secondary | ICD-10-CM | POA: Diagnosis not present

## 2023-01-29 ENCOUNTER — Ambulatory Visit (INDEPENDENT_AMBULATORY_CARE_PROVIDER_SITE_OTHER): Payer: Self-pay

## 2023-01-29 DIAGNOSIS — J309 Allergic rhinitis, unspecified: Secondary | ICD-10-CM | POA: Diagnosis not present

## 2023-01-29 DIAGNOSIS — G4733 Obstructive sleep apnea (adult) (pediatric): Secondary | ICD-10-CM | POA: Diagnosis not present

## 2023-02-04 ENCOUNTER — Other Ambulatory Visit: Payer: Self-pay | Admitting: Cardiology

## 2023-02-07 ENCOUNTER — Ambulatory Visit (INDEPENDENT_AMBULATORY_CARE_PROVIDER_SITE_OTHER): Payer: Self-pay

## 2023-02-07 DIAGNOSIS — J309 Allergic rhinitis, unspecified: Secondary | ICD-10-CM | POA: Diagnosis not present

## 2023-02-12 ENCOUNTER — Telehealth: Payer: Self-pay | Admitting: Orthopedic Surgery

## 2023-02-12 DIAGNOSIS — G4733 Obstructive sleep apnea (adult) (pediatric): Secondary | ICD-10-CM | POA: Diagnosis not present

## 2023-02-13 NOTE — Telephone Encounter (Signed)
error 

## 2023-02-14 ENCOUNTER — Ambulatory Visit (INDEPENDENT_AMBULATORY_CARE_PROVIDER_SITE_OTHER): Payer: Self-pay

## 2023-02-14 DIAGNOSIS — J309 Allergic rhinitis, unspecified: Secondary | ICD-10-CM

## 2023-02-20 ENCOUNTER — Encounter: Payer: Self-pay | Admitting: Cardiology

## 2023-02-21 ENCOUNTER — Ambulatory Visit (INDEPENDENT_AMBULATORY_CARE_PROVIDER_SITE_OTHER): Payer: Self-pay

## 2023-02-21 DIAGNOSIS — J309 Allergic rhinitis, unspecified: Secondary | ICD-10-CM

## 2023-02-23 NOTE — Telephone Encounter (Signed)
BP's and heart rates look fine, no changes  Dominga Ferry MD

## 2023-02-26 ENCOUNTER — Other Ambulatory Visit: Payer: Self-pay | Admitting: Cardiology

## 2023-02-26 ENCOUNTER — Ambulatory Visit (INDEPENDENT_AMBULATORY_CARE_PROVIDER_SITE_OTHER): Payer: Self-pay

## 2023-02-26 DIAGNOSIS — J309 Allergic rhinitis, unspecified: Secondary | ICD-10-CM | POA: Diagnosis not present

## 2023-03-07 ENCOUNTER — Other Ambulatory Visit: Payer: Self-pay | Admitting: Neurology

## 2023-03-08 NOTE — Telephone Encounter (Signed)
 Dr.Yan you are work in provider ( Dr.Camara patient)  Last seen on 12/26/22 Follow up scheduled on 07/18/23 Last filled on 11/08/22 #120 tablets (60 day supply) Rx pending to be signed

## 2023-03-09 ENCOUNTER — Encounter: Payer: Self-pay | Admitting: Neurology

## 2023-03-13 ENCOUNTER — Other Ambulatory Visit: Payer: Self-pay

## 2023-03-13 NOTE — Telephone Encounter (Signed)
 Medication was already refilled by Dr Terrace Arabia 1/3

## 2023-03-13 NOTE — Telephone Encounter (Signed)
 Requested Prescriptions   Pending Prescriptions Disp Refills   pregabalin  (LYRICA ) 150 MG capsule 120 capsule 2    Sig: Take 1 capsule (150 mg total) by mouth 2 (two) times daily.   Last seen 12/26/22 Next appt 07/18/23  Dispenses    Dispensed Days Supply Quantity Provider Pharmacy  pregabalin  (LYRICA ) capsule 11/08/2022 60 120 Gregg Lek, MD   PREGABALIN  150MG  CAP 08/01/2022 60 120 capsule Gregg Lek, MD [facility]  PREGABALIN  150 MG CAPSULE 06/27/2022 30 60 each Perri Stark, NP [facility] [phone] - E...  PREGABALIN  150 MG CAPSULE 05/30/2022 30 60 each Maree Isles, MD [facility] [phone] - E...  PREGABALIN  150 MG CAPSULE 05/02/2022 30 60 each Maree Isles, MD [facility] [phone] - [place]

## 2023-03-16 ENCOUNTER — Ambulatory Visit (INDEPENDENT_AMBULATORY_CARE_PROVIDER_SITE_OTHER): Payer: Self-pay

## 2023-03-16 DIAGNOSIS — J309 Allergic rhinitis, unspecified: Secondary | ICD-10-CM | POA: Diagnosis not present

## 2023-03-19 MED ORDER — PREGABALIN 150 MG PO CAPS: 150.0000 mg | ORAL_CAPSULE | Freq: Two times a day (BID) | ORAL | 2 refills | Status: DC

## 2023-03-19 NOTE — Telephone Encounter (Signed)
 Pregablin won't arrive in time by mail order resend

## 2023-03-19 NOTE — Addendum Note (Signed)
 Addended by: Danne Harbor on: 03/19/2023 10:58 AM   Modules accepted: Orders

## 2023-03-22 ENCOUNTER — Telehealth: Payer: Self-pay

## 2023-03-22 NOTE — Telephone Encounter (Signed)
Call from patient inquiring on lyrica PA. I explain that Dr. Teresa Coombs sent the script in but insurance requires a prior authorization. He states he has been with out the medication for 6 days. Advised patient of options of paying out of pocket until approval or denial and also asking about using a GOOD Rx coupon cade. Patient states he waiting on call back from pharmacy and will ask about those options.Advised I would follow up with PA team about status He was appreciative of call.

## 2023-03-23 ENCOUNTER — Ambulatory Visit (INDEPENDENT_AMBULATORY_CARE_PROVIDER_SITE_OTHER): Payer: Self-pay

## 2023-03-23 DIAGNOSIS — J309 Allergic rhinitis, unspecified: Secondary | ICD-10-CM

## 2023-03-26 ENCOUNTER — Other Ambulatory Visit (HOSPITAL_COMMUNITY): Payer: Self-pay

## 2023-03-26 ENCOUNTER — Telehealth: Payer: Self-pay

## 2023-03-26 NOTE — Telephone Encounter (Signed)
Pharmacy Patient Advocate Encounter   Received notification from Patient Advice Request messages that prior authorization for Pregabalin 150MG  capsules is required/requested.   Insurance verification completed.   The patient is insured through [facility] .   Per test claim: PA required; PA submitted to above mentioned insurance via CoverMyMeds Key/confirmation #/EOC [facility] Status is pending

## 2023-03-28 ENCOUNTER — Other Ambulatory Visit (HOSPITAL_COMMUNITY): Payer: Self-pay

## 2023-03-28 NOTE — Telephone Encounter (Signed)
Pharmacy Patient Advocate Encounter  Received notification from [facility] that Prior Authorization for Pregabalin 150MG  capsules has been APPROVED from 03/26/23 to 03/24/24. Ran test claim, Copay is $3.05. This test claim was processed through [facility]- copay amounts may vary at other pharmacies due to pharmacy/plan contracts, or as the patient moves through the different stages of their insurance plan.   PA #/Case ID/Reference #: PA Case ID #: [id number]

## 2023-03-30 ENCOUNTER — Ambulatory Visit (INDEPENDENT_AMBULATORY_CARE_PROVIDER_SITE_OTHER): Payer: Self-pay

## 2023-03-30 DIAGNOSIS — J309 Allergic rhinitis, unspecified: Secondary | ICD-10-CM | POA: Diagnosis not present

## 2023-04-04 DIAGNOSIS — H04221 Epiphora due to insufficient drainage, right lacrimal gland: Secondary | ICD-10-CM | POA: Diagnosis not present

## 2023-04-04 DIAGNOSIS — H401132 Primary open-angle glaucoma, bilateral, moderate stage: Secondary | ICD-10-CM | POA: Diagnosis not present

## 2023-04-06 ENCOUNTER — Ambulatory Visit (INDEPENDENT_AMBULATORY_CARE_PROVIDER_SITE_OTHER): Payer: 59

## 2023-04-06 DIAGNOSIS — J309 Allergic rhinitis, unspecified: Secondary | ICD-10-CM | POA: Diagnosis not present

## 2023-04-13 ENCOUNTER — Ambulatory Visit (INDEPENDENT_AMBULATORY_CARE_PROVIDER_SITE_OTHER): Payer: Self-pay

## 2023-04-13 DIAGNOSIS — J309 Allergic rhinitis, unspecified: Secondary | ICD-10-CM

## 2023-04-20 ENCOUNTER — Ambulatory Visit (INDEPENDENT_AMBULATORY_CARE_PROVIDER_SITE_OTHER): Payer: 59 | Admitting: *Deleted

## 2023-04-20 DIAGNOSIS — J309 Allergic rhinitis, unspecified: Secondary | ICD-10-CM | POA: Diagnosis not present

## 2023-04-21 ENCOUNTER — Encounter: Payer: Self-pay | Admitting: Cardiology

## 2023-04-23 ENCOUNTER — Other Ambulatory Visit: Payer: Self-pay | Admitting: *Deleted

## 2023-04-23 MED ORDER — CHLORTHALIDONE 25 MG PO TABS: 25.0000 mg | ORAL_TABLET | ORAL | 1 refills | Status: DC

## 2023-04-27 ENCOUNTER — Ambulatory Visit (INDEPENDENT_AMBULATORY_CARE_PROVIDER_SITE_OTHER): Payer: 59

## 2023-04-27 DIAGNOSIS — J309 Allergic rhinitis, unspecified: Secondary | ICD-10-CM | POA: Diagnosis not present

## 2023-04-28 DIAGNOSIS — H409 Unspecified glaucoma: Secondary | ICD-10-CM | POA: Diagnosis not present

## 2023-04-28 DIAGNOSIS — G629 Polyneuropathy, unspecified: Secondary | ICD-10-CM | POA: Diagnosis not present

## 2023-04-28 DIAGNOSIS — Z818 Family history of other mental and behavioral disorders: Secondary | ICD-10-CM | POA: Diagnosis not present

## 2023-04-28 DIAGNOSIS — I10 Essential (primary) hypertension: Secondary | ICD-10-CM | POA: Diagnosis not present

## 2023-04-28 DIAGNOSIS — E785 Hyperlipidemia, unspecified: Secondary | ICD-10-CM | POA: Diagnosis not present

## 2023-04-28 DIAGNOSIS — Z809 Family history of malignant neoplasm, unspecified: Secondary | ICD-10-CM | POA: Diagnosis not present

## 2023-04-28 DIAGNOSIS — Z8249 Family history of ischemic heart disease and other diseases of the circulatory system: Secondary | ICD-10-CM | POA: Diagnosis not present

## 2023-04-28 DIAGNOSIS — M545 Low back pain, unspecified: Secondary | ICD-10-CM | POA: Diagnosis not present

## 2023-04-28 DIAGNOSIS — J301 Allergic rhinitis due to pollen: Secondary | ICD-10-CM | POA: Diagnosis not present

## 2023-04-28 DIAGNOSIS — K219 Gastro-esophageal reflux disease without esophagitis: Secondary | ICD-10-CM | POA: Diagnosis not present

## 2023-04-28 DIAGNOSIS — M1990 Unspecified osteoarthritis, unspecified site: Secondary | ICD-10-CM | POA: Diagnosis not present

## 2023-04-30 ENCOUNTER — Other Ambulatory Visit (HOSPITAL_COMMUNITY): Payer: Self-pay

## 2023-05-09 ENCOUNTER — Ambulatory Visit: Payer: 59 | Admitting: Cardiology

## 2023-05-10 ENCOUNTER — Encounter: Payer: Self-pay | Admitting: Cardiology

## 2023-05-11 ENCOUNTER — Ambulatory Visit (INDEPENDENT_AMBULATORY_CARE_PROVIDER_SITE_OTHER): Payer: Self-pay

## 2023-05-11 ENCOUNTER — Other Ambulatory Visit: Payer: Self-pay

## 2023-05-11 DIAGNOSIS — J309 Allergic rhinitis, unspecified: Secondary | ICD-10-CM | POA: Diagnosis not present

## 2023-05-11 MED ORDER — LISINOPRIL 20 MG PO TABS: 20.0000 mg | ORAL_TABLET | Freq: Every day | ORAL | 3 refills | Status: DC

## 2023-05-11 MED ORDER — AMLODIPINE BESYLATE 10 MG PO TABS: 10.0000 mg | ORAL_TABLET | Freq: Every day | ORAL | 3 refills | Status: DC

## 2023-05-11 NOTE — Telephone Encounter (Signed)
 [facility]illed [facility]ine and lisinopril

## 2023-05-14 DIAGNOSIS — G4733 Obstructive sleep apnea (adult) (pediatric): Secondary | ICD-10-CM | POA: Diagnosis not present

## 2023-05-18 ENCOUNTER — Ambulatory Visit (INDEPENDENT_AMBULATORY_CARE_PROVIDER_SITE_OTHER): Payer: Self-pay

## 2023-05-18 DIAGNOSIS — J309 Allergic rhinitis, unspecified: Secondary | ICD-10-CM

## 2023-05-21 ENCOUNTER — Other Ambulatory Visit: Payer: Self-pay | Admitting: Cardiology

## 2023-05-21 ENCOUNTER — Other Ambulatory Visit: Payer: Self-pay | Admitting: Internal Medicine

## 2023-05-23 ENCOUNTER — Encounter: Payer: Self-pay | Admitting: Cardiology

## 2023-05-23 ENCOUNTER — Ambulatory Visit: Attending: Cardiology | Admitting: Cardiology

## 2023-05-23 VITALS — BP 122/84 | HR 79 | Ht 70.0 in | Wt 324.6 lb

## 2023-05-23 DIAGNOSIS — I10 Essential (primary) hypertension: Secondary | ICD-10-CM

## 2023-05-23 DIAGNOSIS — I272 Pulmonary hypertension, unspecified: Secondary | ICD-10-CM

## 2023-05-23 DIAGNOSIS — E782 Mixed hyperlipidemia: Secondary | ICD-10-CM | POA: Diagnosis not present

## 2023-05-23 MED ORDER — ROSUVASTATIN CALCIUM 10 MG PO TABS
10.0000 mg | ORAL_TABLET | Freq: Every day | ORAL | 3 refills | Status: DC
Start: 2023-05-23 — End: 2023-08-29

## 2023-05-23 MED ORDER — CHLORTHALIDONE 25 MG PO TABS: 25.0000 mg | ORAL_TABLET | ORAL | Status: DC

## 2023-05-23 NOTE — Patient Instructions (Signed)
 Medication Instructions:  Your physician has recommended you make the following change in your medication:   -Increase Crestor to 10 mg once daily   *If you need a refill on your cardiac medications before your next appointment, please call your pharmacy*   Lab Work: BMET  If you have labs (blood work) drawn today and your tests are completely normal, you will receive your results only by: MyChart Message (if you have MyChart) OR A paper copy in the mail If you have any lab test that is abnormal or we need to change your treatment, we will call you to review the results.   Testing/Procedures: None   Follow-Up: At [facility], you and your health needs are our priority.  As part of our continuing mission to provide you with exceptional heart care, we have created designated Provider Care Teams.  These Care Teams include your primary Cardiologist (physician) and Advanced Practice Providers (APPs -  Physician Assistants and Nurse Practitioners) who all work together to provide you with the care you need, when you need it.  We recommend signing up for the patient portal called "MyChart".  Sign up information is provided on this After Visit Summary.  MyChart is used to connect with patients for Virtual Visits (Telemedicine).  Patients are able to view lab/test results, encounter notes, upcoming appointments, etc.  Non-urgent messages can be sent to your provider as well.   To learn more about what you can do with MyChart, go to [email or web address].    Your next appointment:   6 month(s)  Provider:   You may see Dina Rich, MD or one of the following Advanced Practice Providers on your designated Care Team:   Randall An, PA-C  Jacolyn Reedy, [place]     Other Instructions

## 2023-05-23 NOTE — Progress Notes (Signed)
 Clinical Summary Micheal Ross is a 63 y.o.male seen today for follow up of the following medical problems.    1. HTN - fatigue on clonidine which had been started by another provider, has weaned off.  - home bp's 110s-120s/60s-70s - compliant with meds. Has had slight uptrend in Cr, unclear if related to chlorthalidone.    2.Possible pulmonary HTN - CTA Jan 2024 Central pulmonary arteries  are enlarged in keeping with changes of pulmonary arterial  hypertension.  - - 04/2021 echo pcp office: grossly normal LVEF, limited visualization. Grossly no major valve abnormalitiy - he does have history of OSA   06/2022 echo: LVEF 60-65%, grade I dd, normal RV size and function, inadequate TR jet to estimated PASP - dizziness on higher lisinopril dosing.    3. OSA - compliant with cpap - followed by Dr Craige Cotta, last seen in May 2024   4. HLD - Jan 2024 TC 185 TG 88 HDL 45 LDL 124 - he is on crestor 5mg  daily - reports upcoming labs with pcp   Jan 2025 TC 187 TG 96 HDL 57 LDL 111         Other medical issues not addressed this visit   1.Syncope - seen in ER 04/06/21 - was having abdominal and neck pain at the time - EKG NSR, benign labs - 04/2021 echo pcp office: grossly normal LVEF, limited visualization. Grossly no major valve abnormalitiy - pcp 14 day zio patch: min HR 50, max HR 152, Avg HR 73. Rare supraventricular ectopy, three runs of SVT longest 8 beats. Frequent isolated PVCs (6.3%), no NSVT or VT.      - symptoms started Jan 30 - Jan 31 in bathroom. Stood up to walk to bathroom, walked to bathroom. Washing your hands. Felt lightheaded, then passed out and felt going down. Came to quickly, very brief LOC. Got up and family helped back to bedroom. Rested and layed in bed - no issues leading up to that day    Lightheadedness, vision blurry episodes daily. Can occur with with sitting or standing. Can be laying down and symptoms. Some palpitatons at times.    Sometimes if  laying on left side and tilts head to left, feels like he may pass out. Resolves with position change.  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 90 tablet 3   Azelastine HCl (ASTEPRO) 0.15 % SOLN Place 1 spray into the nose in the morning and at bedtime. 30 mL 5   cetirizine (ZYRTEC) 10 MG tablet TAKE 1 TABLET BY MOUTH EVERY DAY 90 tablet 2   chlorthalidone (HYGROTON) 25 MG tablet TAKE 1/2 TABLET EVERY OTHERDAY 23 tablet 2  EPINEPHrine (EPIPEN 2-PAK) 0.3 mg/0.3 mL IJ SOAJ injection Inject 0.3 mg into the muscle as needed for anaphylaxis. 2 each 1   fluticasone (FLONASE) 50 MCG/ACT nasal spray Place 2 sprays into both nostrils daily. 16 g 5   lisinopril (ZESTRIL) 20 MG tablet Take 1 tablet (20 mg total) by mouth daily. 90 tablet 3   Olopatadine HCl 0.2 % SOLN Apply 1 drop to eye daily as needed. (Patient taking differently: Place 1 drop into both eyes daily.) 2.5 mL 5   pregabalin (LYRICA) 150 MG capsule Take 1 capsule (150 mg total) by mouth 2 (two) times daily. 120 capsule 2   rosuvastatin (CRESTOR) 5 MG tablet Take 5 mg by mouth at bedtime.     No current facility-administered medications for this visit.     Past Surgical History:  Procedure Laterality Date   ANKLE SURGERY     BIOPSY  04/21/2022   Procedure: BIOPSY;  Surgeon:  Lanelle Bal, DO;  [facility]: [facility];  Service: Endoscopy;;   BIOPSY  09/15/2022   Procedure: BIOPSY;  Surgeon: Lanelle Bal, DO;  [facility]: [facility];  Service: Endoscopy;;   brain tumor removal  2014   CARPAL TUNNEL RELEASE Left 08/15/2021   Procedure: LEFT CARPAL TUNNEL RELEASE;  Surgeon: Eldred Manges, MD;  Location: [facility];  Service: Orthopedics;  Laterality: Left;   CATARACT EXTRACTION W/PHACO Right 10/13/2022   Procedure: CATARACT EXTRACTION PHACO AND INTRAOCULAR LENS PLACEMENT (IOC);  Surgeon: Pecolia Ades, MD;  Location: [facility];  Service: Ophthalmology;  Laterality: Right;  CDE 2.87   CATARACT EXTRACTION W/PHACO Left 10/27/2022   Procedure: CATARACT EXTRACTION PHACO AND INTRAOCULAR LENS PLACEMENT (IOC);  Surgeon: Pecolia Ades, MD;  Location: [facility];  Service: Ophthalmology;  Laterality: Left;  CDE 3.07   COLONOSCOPY WITH PROPOFOL N/A 01/13/2019   Procedure: COLONOSCOPY WITH PROPOFOL;  Surgeon: Corbin Ade, MD;  20 mm polyp found in the cecum that was removed via piecemeal hot snare polypectomy. Repeat in 4 months. path tubulovillous adenoma, negative for high-grade dysplasia   COLONOSCOPY WITH PROPOFOL N/A 01/20/2019   Procedure: COLONOSCOPY WITH PROPOFOL;  Surgeon: West Bali, MD; large defect (2 cm x 1.5 cm with small visible vessel with stigmata of recent bleed) in the cecum.  3 hemostatic clips placed (MR conditional).  One 3 mm polyp in the cecum, tortuous colon, external and internal hemorrhoids.  Pathology revealed tubular adenoma.   COLONOSCOPY WITH PROPOFOL N/A 10/06/2021   Procedure: COLONOSCOPY WITH PROPOFOL;  Surgeon: Lanelle Bal, DO;  [facility]: [facility];  Service: Endoscopy;  Laterality: N/A;  7:30am   ESOPHAGOGASTRODUODENOSCOPY (EGD) WITH PROPOFOL N/A 04/21/2022   Procedure: ESOPHAGOGASTRODUODENOSCOPY (EGD) WITH PROPOFOL;  Surgeon: Lanelle Bal, DO;  [facility]: [facility];  Service: Endoscopy;  Laterality:  N/A;  145pm, asa 3, pt can't come earlier - transportation   ESOPHAGOGASTRODUODENOSCOPY (EGD) WITH PROPOFOL N/A 09/15/2022   Procedure: ESOPHAGOGASTRODUODENOSCOPY (EGD) WITH PROPOFOL;  Surgeon: Lanelle Bal, DO;  [facility]: [facility];  Service: Endoscopy;  Laterality: N/A;  8:00 am , asa 3   HERNIA REPAIR  2010   umbilical   NASAL SEPTOPLASTY W/ TURBINOPLASTY Bilateral 11/30/2020   Procedure: NASAL SEPTOPLASTY WITH BILATERAL  TURBINATE REDUCTION;  Surgeon: Newman Pies, MD;  Location: [facility];  Service: ENT;  Laterality: Bilateral;   POLYPECTOMY  01/13/2019   Procedure: POLYPECTOMY;  Surgeon: Corbin Ade, MD;  [facility];  Service: Endoscopy;;  Large Cecal Polyp    POLYPECTOMY  10/06/2021   Procedure: POLYPECTOMY;  Surgeon: Lanelle Bal, DO;  [facility];  Service: Endoscopy;;   POLYPECTOMY  09/15/2022   Procedure: POLYPECTOMY;  Surgeon: Lanelle Bal, DO;  [facility];  Service: Endoscopy;;   SINOSCOPY     ULNAR TUNNEL RELEASE Left 07/19/2022   Procedure: LEFT CUBITAL TUNNEL RELEASE;  Surgeon: Eldred Manges, MD;  Location: [facility];  Service: Orthopedics;  Laterality: Left;     Allergies  Allergen Reactions   Methocarbamol Other (See Comments)    Twitching in sleep      Family History  Problem Relation Age of Onset   Thyroid disease Mother    COPD Father    Cancer Maternal Aunt    Cancer Maternal Uncle        back   Cancer Paternal Uncle        throat    Cancer Cousin        prostate   Colon cancer Neg Hx    Colon polyps Neg Hx    Allergic rhinitis Neg Hx    Angioedema Neg Hx    Asthma Neg Hx    Eczema Neg Hx      Social History Mr. Massie reports that he quit smoking about 40 years ago. His smoking use included cigarettes. He started smoking about 42 years ago. He has a 1 pack-year smoking history. He has been exposed to tobacco smoke. He has never used smokeless tobacco. Mr.  Fehnel reports that he does not currently use alcohol.     Physical Examination Today's Vitals   05/23/23 0830  BP: 122/84  Pulse: 79  SpO2: 98%  Weight: (!) 324 lb 9.6 oz (147.2 kg)  Height: 5\' 10"  (1.778 m)   Body mass index is 46.58 kg/m.  Gen: resting comfortably, no acute distress HEENT: no scleral icterus, pupils equal round and reactive, no palptable cervical adenopathy,  CV: RRR, no m/rg, no jvd Resp: Clear to auscultation bilaterally GI: abdomen is soft, non-tender, non-distended, normal bowel sounds, no hepatosplenomegaly MSK: extremities are warm, no edema.  Skin: warm, no rash Neuro:  no focal deficits Psych: appropriate affect   Diagnostic Studies 06/2022 echo 1. Left ventricular ejection fraction, by estimation, is 60 to 65%. The  left ventricle has normal function. The left ventricle has no regional  wall motion abnormalities. Left ventricular diastolic parameters are  consistent with Grade I diastolic  dysfunction (impaired relaxation). The average left ventricular global  longitudinal strain is -22.9 %. The global longitudinal strain is normal.   2. Right ventricular systolic function is normal. The right ventricular  size is normal. Tricuspid regurgitation signal is inadequate for assessing  PA pressure.   3. The mitral valve is normal in structure. No evidence of mitral valve  regurgitation. No evidence of mitral stenosis.   4. The aortic valve is tricuspid. There is mild calcification of the  aortic valve. There is mild thickening of the aortic valve. Aortic valve  regurgitation is not visualized. No aortic stenosis is present.     Assessment and Plan   HTN - at goal - mild uptrend in Cr, we will repeat. If ongoing may need to come off chlorthalidone, only taking 25mg  every other day. Would consider aldactone as alternative.    2. Possible pulmonary HTN - suggested by recent CTA. He does have grade I dd, OSA.  - echos have had inadequate TR  jet  not able to estimate PASP. Have shown normal RV and RA. In absence of symptoms with normal RA/RV would not pursue additional testing at this time. Continue to manage HTN/diastolic dysfunction as well as OSA - continue to monitor at this time, likely repeat echo next visit   3. HLD - above goal, increase crestor to 10mg  daily  4. Severe Obesity - refer to nutrition  EKG today shows NSR   Antoine Poche, M.D.

## 2023-05-25 ENCOUNTER — Ambulatory Visit (INDEPENDENT_AMBULATORY_CARE_PROVIDER_SITE_OTHER): Payer: Self-pay

## 2023-05-25 DIAGNOSIS — J309 Allergic rhinitis, unspecified: Secondary | ICD-10-CM | POA: Diagnosis not present

## 2023-06-01 ENCOUNTER — Ambulatory Visit (INDEPENDENT_AMBULATORY_CARE_PROVIDER_SITE_OTHER): Payer: Self-pay

## 2023-06-01 DIAGNOSIS — J309 Allergic rhinitis, unspecified: Secondary | ICD-10-CM | POA: Diagnosis not present

## 2023-06-08 ENCOUNTER — Ambulatory Visit (INDEPENDENT_AMBULATORY_CARE_PROVIDER_SITE_OTHER)

## 2023-06-08 DIAGNOSIS — J309 Allergic rhinitis, unspecified: Secondary | ICD-10-CM

## 2023-06-11 ENCOUNTER — Ambulatory Visit: Payer: 59 | Admitting: Internal Medicine

## 2023-06-13 ENCOUNTER — Encounter: Attending: Internal Medicine | Admitting: Nutrition

## 2023-06-13 DIAGNOSIS — Z6841 Body Mass Index (BMI) 40.0 and over, adult: Secondary | ICD-10-CM | POA: Insufficient documentation

## 2023-06-13 DIAGNOSIS — E782 Mixed hyperlipidemia: Secondary | ICD-10-CM | POA: Diagnosis present

## 2023-06-13 NOTE — Progress Notes (Signed)
 Medical Nutrition Therapy  Appointment Start time:  1300  Appointment End time:  1415 Primary concerns today: Obesity  Referral diagnosis: E66.01 Preferred learning style: No Preference  Learning readiness: Ready   NUTRITION ASSESSMENT  63 yr old bmale referred for obesity from his cardiologist, Dr. Amanda Jungling. Using air fryer,  cut out sweets,  Diet has been highly processed with fast foods and fried foods.. He wants to make some changes to lose weight and get healthier. He is willing to work on commitment to a more whole plant based lifestyle for desired weight loss and reduce chronic medical problems.  Clinical Lipid Panel     Component Value Date/Time   CHOL 224 (H) 03/31/2019 1053   TRIG 173 (H) 03/31/2019 1053   HDL 46 03/31/2019 1053   CHOLHDL 4.9 03/31/2019 1053   LDLCALC 147 (H) 03/31/2019 1053   LABVLDL 31 03/31/2019 1053      Latest Ref Rng & Units 07/18/2022    8:49 AM 04/01/2022    8:46 AM 08/05/2021   10:52 AM  CMP  Glucose 70 - 99 mg/dL 604  540    BUN 8 - 23 mg/dL 11  11    Creatinine 9.81 - 1.24 mg/dL 1.91  4.78  2.95   Sodium 135 - 145 mmol/L 136  139    Potassium 3.5 - 5.1 mmol/L 3.8  4.1    Chloride 98 - 111 mmol/L 98  105    CO2 22 - 32 mmol/L 28  26    Calcium  8.9 - 10.3 mg/dL 9.2  9.1    Total Protein 6.5 - 8.1 g/dL  7.5    Total Bilirubin 0.3 - 1.2 mg/dL  0.6    Alkaline Phos 38 - 126 U/L  61    AST 15 - 41 U/L  20    ALT 0 - 44 U/L  22     No results found for: "HGBA1C"  Medical Hx:  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 90 tablet 3   Azelastine  HCl (ASTEPRO ) 0.15 % SOLN  Place 1 spray into the nose in the morning and at bedtime. 30 mL 5   cetirizine  (ZYRTEC ) 10 MG tablet TAKE 1 TABLET BY MOUTH EVERY DAY 90 tablet 2   chlorthalidone  (HYGROTON ) 25 MG tablet Take 1 tablet (25 mg total) by mouth every other day.     fluticasone  (FLONASE ) 50 MCG/ACT nasal spray Place 2 sprays into both nostrils daily. 16 g 5   latanoprost (XALATAN) 0.005 % ophthalmic solution SMARTSIG:In Eye(s)     lisinopril  (ZESTRIL ) 20 MG tablet Take 1 tablet (20 mg total) by mouth daily. 90 tablet 3   Olopatadine  HCl 0.2 % SOLN Apply 1 drop to eye daily as needed. (Patient taking differently: Place 1 drop into both eyes daily.) 2.5 mL 5   pregabalin  (LYRICA ) 150 MG capsule Take 1 capsule (150 mg total) by mouth 2 (two) times daily. 120 capsule 2   rosuvastatin  (CRESTOR ) 10 MG tablet Take 1 tablet (10 mg  total) by mouth daily. 90 tablet 3   EPINEPHrine  (EPIPEN  2-PAK) 0.3 mg/0.3 mL IJ SOAJ injection Inject 0.3 mg into the muscle as needed for anaphylaxis. 2 each 1   No current facility-administered medications on file prior to visit.    Labs:LDL 111 mg/dl.  Notable Signs/Symptoms: None  Lifestyle & Dietary Hx Lives with his wife. His wife does the cooking and shopping.  Estimated daily fluid intake: 40 oz Supplements:  Sleep:  Stress / self-care:  Current average weekly physical activity: ADL  24-Hr Dietary Recall Eats 2 meals per dya  Estimated Energy Needs Calories: 1600 Carbohydrate: 180g Protein: 120g Fat: 44g   NUTRITION DIAGNOSIS  NI-1.7 Predicted excessive energy intake As related to Excessive/High calorie die with 17 lbs weight gain in 6 months.  As evidenced by BMI 46.   NUTRITION INTERVENTION  Nutrition education (E-1) on the following topics:  Nutrition and Diabetes education provided on My Plate, CHO counting, meal planning, portion sizes, timing of meals, avoiding snacks between meals unless having a low blood sugar, target ranges for A1C and blood sugars,  signs/symptoms and treatment of hyper/hypoglycemia, monitoring blood sugars, taking medications as prescribed, benefits of exercising 30 minutes per day and prevention of complications of DM.  Weight loss tips  Handouts Provided Include  Lifestyle Medicine handouts  Learning Style & Readiness for Change Teaching method utilized: Visual & Auditory  Demonstrated degree of understanding via: Teach Back  Barriers to learning/adherence to lifestyle change: size may effect mobility issues for needed exercise.  Goals Established by Pt Goals  Eat three meals per day Watch portion sizes Increase fruits, vegetables and whole grains Use oatmeal Avoid processed foods and junk food Increase exercise 15-30 minutes a day   MONITORING & EVALUATION Dietary intake, weekly physical activity, and  in 1 month.  Next Steps  Patient is to Goals  Eat three meals per day Watch portion sizes Increase fruits, vegetables and whole grains Use oatmeal Avoid processed foods and junk food Increase exercise 15-30 minutes a day.

## 2023-06-13 NOTE — Patient Instructions (Addendum)
 Goals  Eat three meals per day Watch portion sizes Increase fruits, vegetables and whole grains Use oatmeal Avoid processed foods and junk food Increase exercise 15-30 minutes a day

## 2023-06-20 ENCOUNTER — Ambulatory Visit (INDEPENDENT_AMBULATORY_CARE_PROVIDER_SITE_OTHER): Payer: Self-pay

## 2023-06-20 DIAGNOSIS — J309 Allergic rhinitis, unspecified: Secondary | ICD-10-CM

## 2023-06-26 ENCOUNTER — Encounter: Payer: Self-pay | Admitting: Nutrition

## 2023-06-29 ENCOUNTER — Ambulatory Visit (INDEPENDENT_AMBULATORY_CARE_PROVIDER_SITE_OTHER): Payer: Self-pay

## 2023-06-29 ENCOUNTER — Ambulatory Visit: Admitting: Allergy & Immunology

## 2023-06-29 DIAGNOSIS — J309 Allergic rhinitis, unspecified: Secondary | ICD-10-CM | POA: Diagnosis not present

## 2023-07-01 ENCOUNTER — Encounter: Payer: Self-pay | Admitting: Cardiology

## 2023-07-01 ENCOUNTER — Other Ambulatory Visit: Payer: Self-pay | Admitting: Gastroenterology

## 2023-07-01 DIAGNOSIS — R1013 Epigastric pain: Secondary | ICD-10-CM

## 2023-07-02 NOTE — Telephone Encounter (Signed)
 BP's look fine. Did he go for the blood work since last visit? Circulation in the legs would tend to cause more leg muscle cramping with walking as opposed to whiteness of feet. Those symptoms would not make me think of a circulation issue, he my bring to the foot doctors and/or his pcp's attention  Letta Raw MD

## 2023-07-02 NOTE — Telephone Encounter (Signed)
 Jearlean Mince, can you find out if patient is still taking pantoprazole ?  Per note on refill request, states medication was discontinued due to change in therapy on 12/11/2022 by Corinda Dibbles, CMA . I am not sure who this is.

## 2023-07-03 NOTE — Telephone Encounter (Signed)
 Spoke to pt, he informed me that he is no longer having issues with heart burn so he stop taking pantoprazole . Informed pharmacy to stop sending refill request.

## 2023-07-06 ENCOUNTER — Ambulatory Visit (INDEPENDENT_AMBULATORY_CARE_PROVIDER_SITE_OTHER): Payer: Self-pay

## 2023-07-06 DIAGNOSIS — J309 Allergic rhinitis, unspecified: Secondary | ICD-10-CM | POA: Diagnosis not present

## 2023-07-13 ENCOUNTER — Ambulatory Visit (INDEPENDENT_AMBULATORY_CARE_PROVIDER_SITE_OTHER): Payer: Self-pay

## 2023-07-13 DIAGNOSIS — J309 Allergic rhinitis, unspecified: Secondary | ICD-10-CM

## 2023-07-15 ENCOUNTER — Other Ambulatory Visit: Payer: Self-pay | Admitting: Cardiology

## 2023-07-17 ENCOUNTER — Ambulatory Visit: Payer: Self-pay

## 2023-07-17 DIAGNOSIS — I10 Essential (primary) hypertension: Secondary | ICD-10-CM

## 2023-07-17 DIAGNOSIS — E782 Mixed hyperlipidemia: Secondary | ICD-10-CM

## 2023-07-17 NOTE — Telephone Encounter (Signed)
 The patient has been notified of the result and verbalized understanding.  All questions (if any) were answered. Casper Clement, [facility] 07/17/2023 8:23 AM

## 2023-07-17 NOTE — Telephone Encounter (Signed)
-----   Message from [facility] sent at 07/17/2023  7:07 AM EDT -----  ----- Message ----- From: Laurann Pollock, MD Sent: 07/16/2023   2:10 PM EDT To: Quentin Brunner, LPN  Some mild decline in renal function. Would stop his chlorthalidone , needs to be sure to drink at least 2 liters of water  daily. I was waiting more time before rechecking his cholesterol, can add to this lab, please get a fasting lipid panel and a bmet for 3 weeks   Letta Raw MD

## 2023-07-18 ENCOUNTER — Ambulatory Visit: Payer: 59 | Admitting: Neurology

## 2023-07-19 ENCOUNTER — Encounter: Attending: Internal Medicine | Admitting: Nutrition

## 2023-07-19 DIAGNOSIS — N183 Chronic kidney disease, stage 3 unspecified: Secondary | ICD-10-CM | POA: Insufficient documentation

## 2023-07-19 DIAGNOSIS — E782 Mixed hyperlipidemia: Secondary | ICD-10-CM | POA: Insufficient documentation

## 2023-07-19 DIAGNOSIS — Z6841 Body Mass Index (BMI) 40.0 and over, adult: Secondary | ICD-10-CM | POA: Diagnosis present

## 2023-07-19 NOTE — Patient Instructions (Signed)
 Goals   Try to eat 3 meals per day Add more higher foods of sweet potatoes, greens, veggies and fruit, Keep working out. Lose 3-4 lbs per month Keep drinking water 

## 2023-07-19 NOTE — Progress Notes (Signed)
 Medical Nutrition Therapy  Appointment Start time:  1300  Appointment End time:  1415 Primary concerns today: Obesity  Referral diagnosis: E66.01 Preferred learning style: No Preference  Learning readiness: Ready   NUTRITION ASSESSMENT obesity follow up Lost 13 lbs since last visit.  Cut out sweet and sodas.  Drinking only water  Eating a lot more vegetables, dried beans, healthier food choices. Exercising an hour a day on bike and walking. Feelsl much better. Sleeping better. Good family support. MD following up with his kidney labs in near future. Sees Dr. Amanda Jungling in 3 weeks. His fluid pill was stopped to see if it helps his kidney.   Goals set previously;  Eat three meals per day- work in progress. Watch portion sizes- done Increase fruits, vegetables and whole grains-done Use oatmeal-done Avoid processed foods and junk food-done Increase exercise 15-30 minutes a day- now working a hour a day on bike and walking. Doing excellent. Clinical Wt Readings from Last 3 Encounters:  07/19/23 (!) 314 lb (142.4 kg)  06/13/23 (!) 327 lb (148.3 kg)  05/23/23 (!) 324 lb 9.6 oz (147.2 kg)   Ht Readings from Last 3 Encounters:  07/19/23 5\' 10"  (1.778 m)  06/13/23 5\' 10"  (1.778 m)  05/23/23 5\' 10"  (1.778 m)   Body mass index is 45.05 kg/m. @BMIFA @ Facility age limit for growth %iles is 20 years. Facility age limit for growth %iles is 20 years.  Lipid Panel     Component Value Date/Time   CHOL 224 (H) 03/31/2019 1053   TRIG 173 (H) 03/31/2019 1053   HDL 46 03/31/2019 1053   CHOLHDL 4.9 03/31/2019 1053   LDLCALC 147 (H) 03/31/2019 1053   LABVLDL 31 03/31/2019 1053      Latest Ref Rng & Units 07/18/2022    8:49 AM 04/01/2022    8:46 AM 08/05/2021   10:52 AM  CMP  Glucose 70 - 99 mg/dL 161  096    BUN 8 - 23 mg/dL 11  11    Creatinine 0.45 - 1.24 mg/dL 4.09  8.11  9.14   Sodium 135 - 145 mmol/L 136  139    Potassium 3.5 - 5.1 mmol/L 3.8  4.1    Chloride 98 - 111 mmol/L 98   105    CO2 22 - 32 mmol/L 28  26    Calcium  8.9 - 10.3 mg/dL 9.2  9.1    Total Protein 6.5 - 8.1 g/dL  7.5    Total Bilirubin 0.3 - 1.2 mg/dL  0.6    Alkaline Phos 38 - 126 U/L  61    AST 15 - 41 U/L  20    ALT 0 - 44 U/L  22     Medical Hx:  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 90 tablet 3   Azelastine  HCl (ASTEPRO ) 0.15 % SOLN Place 1 spray into the nose in the morning and at bedtime. 30 mL 5   cetirizine  (ZYRTEC ) 10 MG tablet TAKE 1 TABLET BY MOUTH EVERY DAY 90  tablet 2   EPINEPHrine  (EPIPEN  2-PAK) 0.3 mg/0.3 mL IJ SOAJ injection Inject 0.3 mg into the muscle as needed for anaphylaxis. 2 each 1   fluticasone  (FLONASE ) 50 MCG/ACT nasal spray Place 2 sprays into both nostrils daily. 16 g 5   latanoprost (XALATAN) 0.005 % ophthalmic solution SMARTSIG:In Eye(s)     lisinopril  (ZESTRIL ) 20 MG tablet TAKE 1 TABLET BY MOUTH EVERY DAY 30 tablet 6   Olopatadine  HCl 0.2 % SOLN Apply 1 drop to eye daily as needed. (Patient taking differently: Place 1 drop into both eyes daily.) 2.5 mL 5   pregabalin  (LYRICA ) 150 MG capsule Take 1 capsule (150 mg total) by mouth 2 (two) times daily. 120 capsule 2   rosuvastatin  (CRESTOR ) 10 MG tablet Take 1 tablet (10 mg total) by mouth daily. 90 tablet 3   No current facility-administered medications on file prior to visit.    Labs:LDL 111 mg/dl.  Notable Signs/Symptoms: None  Lifestyle & Dietary Hx Lives with his wife. His wife does the cooking and shopping.  Estimated daily fluid intake: 40 oz Supplements:  Sleep:  Stress / self-care:  Current average weekly physical  activity: ADL  24-Hr Dietary Recall B) Pinto beans, with onion, and [place] meat- water   D)5 pm Oatmeal with banana and egg whites. water    Estimated Energy Needs Calories: 1600 Carbohydrate: 180g Protein: 120g Fat: 44g   NUTRITION DIAGNOSIS  NI-1.7 Predicted excessive energy intake As related to Excessive/High calorie die with 17 lbs weight gain in 6 months.  As evidenced by BMI 46.   NUTRITION INTERVENTION  Nutrition education (E-1) on the following topics:  Nutrition and Diabetes education provided on My Plate, CHO counting, meal planning, portion sizes, timing of meals, avoiding snacks between meals unless having a low blood sugar, target ranges for A1C and blood sugars, signs/symptoms and treatment of hyper/hypoglycemia, monitoring blood sugars, taking medications as prescribed, benefits of exercising 30 minutes per day and prevention of complications of DM.  Weight loss tips  Handouts Provided Include  Lifestyle Medicine handouts  Learning Style & Readiness for Change Teaching method utilized: Visual & Auditory  Demonstrated degree of understanding via: Teach Back  Barriers to learning/adherence to lifestyle change: size may effect mobility issues for needed exercise.  Goals Established by Pt Goals Great job!!  Try to eat 3 meals per day Add more higher foods of sweet potatoes, greens, veggies and fruit, Keep working out. Lose 3-4 lbs per month Keep drinking water    MONITORING & EVALUATION Dietary intake, weekly physical activity, and  in 1 month.

## 2023-07-27 ENCOUNTER — Ambulatory Visit: Admitting: Allergy & Immunology

## 2023-07-27 ENCOUNTER — Ambulatory Visit (INDEPENDENT_AMBULATORY_CARE_PROVIDER_SITE_OTHER)

## 2023-07-27 DIAGNOSIS — J309 Allergic rhinitis, unspecified: Secondary | ICD-10-CM

## 2023-08-01 DIAGNOSIS — G4733 Obstructive sleep apnea (adult) (pediatric): Secondary | ICD-10-CM | POA: Diagnosis not present

## 2023-08-03 ENCOUNTER — Ambulatory Visit (INDEPENDENT_AMBULATORY_CARE_PROVIDER_SITE_OTHER): Payer: Self-pay

## 2023-08-03 DIAGNOSIS — J309 Allergic rhinitis, unspecified: Secondary | ICD-10-CM

## 2023-08-08 ENCOUNTER — Encounter: Payer: Self-pay | Admitting: Cardiology

## 2023-08-09 ENCOUNTER — Other Ambulatory Visit: Payer: Self-pay

## 2023-08-09 DIAGNOSIS — E782 Mixed hyperlipidemia: Secondary | ICD-10-CM

## 2023-08-10 ENCOUNTER — Ambulatory Visit (INDEPENDENT_AMBULATORY_CARE_PROVIDER_SITE_OTHER): Payer: Self-pay

## 2023-08-10 DIAGNOSIS — J309 Allergic rhinitis, unspecified: Secondary | ICD-10-CM | POA: Diagnosis not present

## 2023-08-13 DIAGNOSIS — G4733 Obstructive sleep apnea (adult) (pediatric): Secondary | ICD-10-CM | POA: Diagnosis not present

## 2023-08-14 NOTE — Telephone Encounter (Signed)
 BP's look good  Letta Raw MD

## 2023-08-24 ENCOUNTER — Ambulatory Visit (INDEPENDENT_AMBULATORY_CARE_PROVIDER_SITE_OTHER): Payer: Self-pay

## 2023-08-24 DIAGNOSIS — J309 Allergic rhinitis, unspecified: Secondary | ICD-10-CM | POA: Diagnosis not present

## 2023-08-29 ENCOUNTER — Encounter: Payer: Self-pay | Admitting: Cardiology

## 2023-08-29 MED ORDER — ROSUVASTATIN CALCIUM 10 MG PO TABS: 10.0000 mg | ORAL_TABLET | Freq: Every day | ORAL | 1 refills | Status: DC

## 2023-08-31 ENCOUNTER — Telehealth: Payer: Self-pay | Admitting: Allergy & Immunology

## 2023-08-31 ENCOUNTER — Ambulatory Visit (INDEPENDENT_AMBULATORY_CARE_PROVIDER_SITE_OTHER)

## 2023-08-31 DIAGNOSIS — J309 Allergic rhinitis, unspecified: Secondary | ICD-10-CM

## 2023-08-31 NOTE — Telephone Encounter (Signed)
 Micheal Ross called in and states that on July 1st he will no longer have [employer] and will have [employer].  He provided me with the information.  MCR: [id number]  HTA: [id number]   Please add this to his chart on July 1st.  He is worried his injections will not be covered if it doesn't get filed on July 1st.   I will try to keep up with this as well.

## 2023-09-06 ENCOUNTER — Encounter: Payer: Self-pay | Admitting: Neurology

## 2023-09-06 ENCOUNTER — Encounter: Payer: Self-pay | Admitting: Cardiology

## 2023-09-10 MED ORDER — PREGABALIN 150 MG PO CAPS: 150.0000 mg | ORAL_CAPSULE | Freq: Two times a day (BID) | ORAL | 2 refills | Status: AC

## 2023-09-11 ENCOUNTER — Encounter: Payer: Self-pay | Admitting: Cardiology

## 2023-09-12 ENCOUNTER — Ambulatory Visit: Admitting: Allergy & Immunology

## 2023-09-12 ENCOUNTER — Encounter: Payer: Self-pay | Admitting: Allergy & Immunology

## 2023-09-12 VITALS — BP 138/86 | HR 75 | Temp 98.0°F | Ht 68.0 in | Wt 307.4 lb

## 2023-09-12 DIAGNOSIS — R519 Headache, unspecified: Secondary | ICD-10-CM | POA: Diagnosis not present

## 2023-09-12 DIAGNOSIS — J309 Allergic rhinitis, unspecified: Secondary | ICD-10-CM

## 2023-09-12 MED ORDER — MONTELUKAST SODIUM 10 MG PO TABS: 10.0000 mg | ORAL_TABLET | Freq: Every day | ORAL | 1 refills | Status: DC

## 2023-09-12 NOTE — Progress Notes (Signed)
 FOLLOW UP  Date of Service/Encounter:  09/12/23   Assessment:   Perennial and seasonal allergic rhinitis (trees, molds) - allergen immunotherapy maintenance reached May 2025  Chronic headaches and nasal congestion - slightly improve with the allergen immunotherapy  Plan/Recommendations:   Allergic Rhinitis (trees, molds) - on allergy  shots - Continue to avoid the nose sprays since you are getting nose bleeds. - We add on Singulair  (montelukast ) to see if this can help with your nasal congestion. - This can rarely cause irritability, bad dreams, and/or depression, so please stop the medication if this happens again in touch with us . - Continue with Zyrtec  10 mg daily.  - Continue with allergy  shots for long term control. - If this is not help with your congestion, we will order a sinus CT to see if there is something in your sinuses that might need an evaluation by another ENT doctor.   2. Return in about 3 months (around 12/13/2023). You can have the follow up appointment with Dr. Iva or a Nurse Practicioner (our Nurse Practitioners are excellent and always have Physician oversight!).   Subjective:   Micheal Ross. is a 63 y.o. male presenting today for follow up of  Chief Complaint  Patient presents with   Follow-up    Feeling some improvement with AIT    Micheal Ross. 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  Discussed the use of AI scribe software for clinical note transcription with the patient and/or guardian, who gave verbal consent to proceed.  Micheal Ross is a 63 y.o. male presenting for a follow up visit.  He was last seen in October 2024.  At that time, Dr. Tobie added Astelin .  Allergy  shots were discussed for long-term control.  He is continued on Flonase  as well as Zyrtec .  He did decide to start allergen immunotherapy.  Since last visit, he has done very well.  Allergic Rhinitis Symptom History: He reports experiencing ongoing nasal congestion characterized by a sensation of fullness and excessive mucus production. Despite previous treatments, including nasal sprays which were discontinued four to five months ago due to recurrent epistaxis, the congestion persists.  He is not interested in any kind of nose sprays.  He has never been Singulair  for what I can tell.  A history of nasal surgery four years ago, intended to improve breathing, did not yield significant improvement.  Dizziness and balance issues have been persistent, with symptoms severe enough to necessitate an MRI of the head in October 2022, which ruled out a stroke. They continue to experience dizziness and vision problems.   In February 2024, a CT scan of the neck was performed due to persistent localized swelling.  This  was unremarkable.    He did see Dr. Karis 3 or 4 years ago and had some kind of sinus surgery performed.  He does not think that it helped much at all.  Orie is on allergen immunotherapy. He receives two injections. Immunotherapy script #1 contains trees. He currently receives 0.71mL of the RED vial (1/100). Immunotherapy script #2 contains molds. He currently receives 0.81mL of the  RED vial (1/100). He started shots November of 2024 and reached maintenance in May of 2025.  Otherwise, there have been no changes to his past medical history, surgical history, family history, or social history.    Review of systems otherwise negative other than that mentioned in the HPI.    Objective:   Blood pressure 138/86, pulse 75, temperature 98 F (36.7 C), height 5' 8 (1.727 m), weight (!) 307 lb 6 oz (139.4 kg), SpO2 96%. Body mass index is 46.74 kg/m.    Physical Exam Vitals reviewed.  Constitutional:      Appearance: He is well-developed.  HENT:     Head: Normocephalic and atraumatic.     Right Ear: Tympanic membrane, ear canal and external ear normal.     Left Ear: Tympanic membrane, ear canal and external ear normal.     Nose: Mucosal edema and rhinorrhea present. No nasal deformity or septal deviation.     Right Turbinates: Enlarged, swollen and pale.     Left Turbinates: Enlarged, swollen and pale.     Right Sinus: No maxillary sinus tenderness or frontal sinus tenderness.     Left Sinus: No maxillary sinus tenderness or frontal sinus tenderness.     Comments: Bilateral frontal sinus tenderness.    Mouth/Throat:     Lips: Pink.     Mouth: Mucous membranes are moist. Mucous membranes are not pale and not dry.     Pharynx: Uvula midline.     Comments: Cobblestoning present in the posterior oropharynx.  Eyes:     General: Lids are normal. No allergic shiner.       Right eye: No discharge.        Left eye: No discharge.     Conjunctiva/sclera: Conjunctivae normal.     Right eye: Right conjunctiva is not injected. No chemosis.    Left eye: Left conjunctiva is not injected. No chemosis.    Pupils: Pupils are equal, round, and reactive to light.  Cardiovascular:     Rate and Rhythm: Normal rate and regular rhythm.     Heart sounds: Normal heart sounds.  Pulmonary:     Effort: Pulmonary effort is normal. No tachypnea, accessory muscle usage or respiratory  distress.     Breath sounds: Normal breath sounds. No wheezing, rhonchi or rales.  Chest:     Chest wall: No tenderness.  Lymphadenopathy:     Cervical: No cervical adenopathy.  Skin:    Coloration: Skin is not pale.     Findings: No abrasion, erythema, petechiae or rash. Rash is not papular, urticarial or vesicular.  Neurological:     Mental Status: He is alert.  Psychiatric:        Behavior: Behavior is cooperative.      Diagnostic studies: none     Marty Shaggy, MD  [facility] 

## 2023-09-12 NOTE — Patient Instructions (Addendum)
 Allergic Rhinitis (trees, molds) - on allergy  shots - Continue to avoid the nose sprays since you are getting nose bleeds. - We add on Singulair  (montelukast ) to see if this can help with your nasal congestion. - This can rarely cause irritability, bad dreams, and/or depression, so please stop the medication if this happens again in touch with us . - Continue with Zyrtec  10 mg daily.  - Continue with allergy  shots for long term control. - If this is not help with your congestion, we will order a sinus CT to see if there is something in your sinuses that might need an evaluation by another ENT doctor.   2. Return in about 3 months (around 12/13/2023). You can have the follow up appointment with Dr. Iva or a Nurse Practicioner (our Nurse Practitioners are excellent and always have Physician oversight!).    Please inform us  of any Emergency Department visits, hospitalizations, or changes in symptoms. Call us  before going to the ED for breathing or allergy  symptoms since we might be able to fit you in for a sick visit. Feel free to contact us  anytime with any questions, problems, or concerns.  It was a pleasure to meet you today!  Websites that have reliable patient information: 1. American Academy of Asthma, Allergy , and Immunology: www.aaaai.org 2. Food Allergy  Research and Education (FARE): foodallergy.org 3. Mothers of Asthmatics: http://www.asthmacommunitynetwork.org 4. American College of Allergy , Asthma, and Immunology: www.acaai.org      "Like" us  on Facebook and Instagram for our latest updates!      A healthy democracy works best when [employer] participate! Make sure you are registered to vote! If you have moved or changed any of your contact information, you will need to get this updated before voting! Scan the QR codes below to learn more!

## 2023-09-17 ENCOUNTER — Encounter: Payer: Self-pay | Admitting: Neurology

## 2023-09-18 DIAGNOSIS — L603 Nail dystrophy: Secondary | ICD-10-CM | POA: Diagnosis not present

## 2023-09-18 DIAGNOSIS — M79675 Pain in left toe(s): Secondary | ICD-10-CM | POA: Diagnosis not present

## 2023-09-18 DIAGNOSIS — M79674 Pain in right toe(s): Secondary | ICD-10-CM | POA: Diagnosis not present

## 2023-09-19 ENCOUNTER — Other Ambulatory Visit: Payer: Self-pay

## 2023-09-20 ENCOUNTER — Ambulatory Visit: Payer: Self-pay | Admitting: Cardiology

## 2023-09-20 DIAGNOSIS — J301 Allergic rhinitis due to pollen: Secondary | ICD-10-CM | POA: Diagnosis not present

## 2023-09-20 NOTE — Progress Notes (Signed)
 VIALS MADE 09-20-23

## 2023-09-21 ENCOUNTER — Ambulatory Visit (INDEPENDENT_AMBULATORY_CARE_PROVIDER_SITE_OTHER): Payer: Self-pay

## 2023-09-21 DIAGNOSIS — J309 Allergic rhinitis, unspecified: Secondary | ICD-10-CM | POA: Diagnosis not present

## 2023-09-24 DIAGNOSIS — J302 Other seasonal allergic rhinitis: Secondary | ICD-10-CM | POA: Diagnosis not present

## 2023-09-25 DIAGNOSIS — H8190 Unspecified disorder of vestibular function, unspecified ear: Secondary | ICD-10-CM | POA: Diagnosis not present

## 2023-09-25 DIAGNOSIS — R2681 Unsteadiness on feet: Secondary | ICD-10-CM | POA: Diagnosis not present

## 2023-09-25 DIAGNOSIS — R222 Localized swelling, mass and lump, trunk: Secondary | ICD-10-CM | POA: Diagnosis not present

## 2023-09-25 DIAGNOSIS — M549 Dorsalgia, unspecified: Secondary | ICD-10-CM | POA: Diagnosis not present

## 2023-09-25 DIAGNOSIS — Z299 Encounter for prophylactic measures, unspecified: Secondary | ICD-10-CM | POA: Diagnosis not present

## 2023-09-25 DIAGNOSIS — I10 Essential (primary) hypertension: Secondary | ICD-10-CM | POA: Diagnosis not present

## 2023-09-26 ENCOUNTER — Telehealth: Payer: Self-pay | Admitting: *Deleted

## 2023-09-26 MED ORDER — ROSUVASTATIN CALCIUM 5 MG PO TABS: 5.0000 mg | ORAL_TABLET | Freq: Every day | ORAL | 3 refills | Status: DC

## 2023-09-26 NOTE — Telephone Encounter (Signed)
 Rx sent as requested.

## 2023-09-26 NOTE — Telephone Encounter (Signed)
-----   Message from Alvan Carrier sent at 09/26/2023 10:04 AM EDT ----- Regarding: RE: Pt states that he was unable to take Crestor  10mg  d/t Abd pain. Pt states that he would like Crestor  decreased to 5 mg daily. Can lower crestor  back to 5mg  daily  JINNY Alvan MD ----- Message ----- From: Bernett Dorothyann LABOR, RN Sent: 09/25/2023   2:03 PM EDT To: Carrier JULIANNA Alvan, MD Subject: Pt states that he was unable to take Crestor #  Pt states that he was unable to take Crestor  10mg  d/t Abd pain. Pt states that he would like Crestor  decreased to 5 mg daily.

## 2023-09-28 ENCOUNTER — Ambulatory Visit (INDEPENDENT_AMBULATORY_CARE_PROVIDER_SITE_OTHER): Payer: Self-pay

## 2023-09-28 DIAGNOSIS — J309 Allergic rhinitis, unspecified: Secondary | ICD-10-CM

## 2023-10-01 ENCOUNTER — Encounter: Payer: Self-pay | Admitting: Neurology

## 2023-10-03 DIAGNOSIS — H04221 Epiphora due to insufficient drainage, right lacrimal gland: Secondary | ICD-10-CM | POA: Diagnosis not present

## 2023-10-03 DIAGNOSIS — H401132 Primary open-angle glaucoma, bilateral, moderate stage: Secondary | ICD-10-CM | POA: Diagnosis not present

## 2023-10-03 DIAGNOSIS — Z961 Presence of intraocular lens: Secondary | ICD-10-CM | POA: Diagnosis not present

## 2023-10-08 ENCOUNTER — Encounter: Payer: Self-pay | Admitting: Neurology

## 2023-10-08 ENCOUNTER — Ambulatory Visit: Admitting: Neurology

## 2023-10-08 VITALS — BP 124/76 | HR 73 | Resp 17 | Ht 70.0 in | Wt 309.5 lb

## 2023-10-08 DIAGNOSIS — R2689 Other abnormalities of gait and mobility: Secondary | ICD-10-CM | POA: Diagnosis not present

## 2023-10-08 DIAGNOSIS — R269 Unspecified abnormalities of gait and mobility: Secondary | ICD-10-CM | POA: Diagnosis not present

## 2023-10-08 DIAGNOSIS — G5603 Carpal tunnel syndrome, bilateral upper limbs: Secondary | ICD-10-CM | POA: Diagnosis not present

## 2023-10-08 DIAGNOSIS — M25512 Pain in left shoulder: Secondary | ICD-10-CM

## 2023-10-08 DIAGNOSIS — M5412 Radiculopathy, cervical region: Secondary | ICD-10-CM

## 2023-10-08 DIAGNOSIS — G5623 Lesion of ulnar nerve, bilateral upper limbs: Secondary | ICD-10-CM

## 2023-10-08 NOTE — Progress Notes (Signed)
 [facility]  PATIENT: Micheal Ross. DOB: Jun 30, 1960  REQUESTING CLINICIAN: Maree Isles, MD HISTORY FROM: Patient and spouse  REASON FOR VISIT: Cervical radiculopathy   HISTORICAL  CHIEF COMPLAINT:  Chief Complaint  Patient presents with   Carpal Tunnel    Rm12, alone, Bilateral CTS: pt stated that is getting better   Ulnar neuropathy     Rm12, alone, Bilateral upper extremity Ulnar neuropathy:pt denied having ulnar neuropathy pain   Neck Pain    Rm12, alone, Cervical radiculopathy, Cervicalgia: pt stated that the neck pain is slowly getting better   Gait Problem    Rm12, alone, gait: pt stated that he often times needs assistance for walking due to balance issues    INTERVAL HISTORY 10/08/2023 Patient presents today for follow-up, last visit was in October of last year.  Since then he continues to do well, tells me that his carpal tunnel syndrome symptoms are better, having less numbness.  Neck pain is also improving.  He is main complaint today is his balance problem and trouble with balance.  At last visit I did refer him to physical therapy but due to transport he was not able to attend.  Tells me that now he has transport and is able to attend physical therapy in [place] or [place].   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  He did follow-up with Dr. Barbarann, did have left cubital tunnel surgery.  Reports the left arm is better, and is pending a possible right carpal tunnel surgery on the right in January.  Currently he is seeing chiropractor, reports that he is neck pain is improving, he is also doing physical therapy but for his vertigo with some improvement.   INTERVAL HISTORY 4/42024 Patient presents today for follow-up, last visit was 6 months ago.   Since then he reports that his neck and shoulder pain has not improved.  He has seen his PCP, went to the ED multiple time, seen neurosurgery and orthopedist but still cannot seem to improve his pain.  He did have a MRI cervical spine back in December which showed mild to moderate foraminal stenosis but no central canal stenosis.  He is following up with Dr. Rockney neurosurgery.  He reports also getting steroid injection with minimal relief.  He is still on the Lyrica  150 mg twice daily.   INTERVAL HISTORY 10/12/21 Patient presents today for follow-up, at last visit plan was to start pregabalin  for cervical radiculopathy pain, referral to physical therapy and I also advised patient to follow back with surgeon regarding carpal tunnel release surgery.  He did have the left carpal tunnel release surgery, for the cervical radiculopathy, surgeon has recommended surgery again but patient is deferring.  He is currently getting physical therapy and reports doing the exercises at home with good improvement.  He is on Lyrica  and reported the combination of Lyrica  and physical therapy actually helps him with his pain. At this moment he is complaining of right-sided above the temple pain . He reports sharp pressure pain, multiple times a day.  And this type of pain has been going on for the past year.  He reports with the pain, he will have some visual disturbance. He denies any loss of vision, denies any nausea, no vomiting.  Pain is at the surgical site for  his meningioma resection.   HISTORY OF PRESENT ILLNESS:  This is a 63 year old gentleman past medical history hypertension, hyperlipidemia, obesity, history of left frontal meningioma that was resected who is presenting with complaint of neck pain radiating to the left shoulder since November 2021.  Patient reports pain is almost constant, described as sharp stabbing pain worse when moving his head to the left or laying on his left side.  He did have MRI of the  neck which showed mild to moderate bilateral foraminal narrowing but no signs of myelopathy.  He did have a nerve conduction study which showed bilateral cervical radiculopathy especially involving C8-T1 on the left side and moderate left carpal tunnel syndrome and mild right carpal tunnel syndrome.  He did follow-up with the orthopedic surgery who recommend conservative management for the cervical radiculopathy and surgical management of the left carpal tunnel syndrome. Currently he is on gabapentin 300 mg twice daily but reported help his pain is not controlled. He did completed physical therapy early last year but again no improvement of his symptoms.  Patient also has been reporting of intermittent lightheadedness.  He did report having orthostatic hypotension in the past, his primary care doctor changed his blood pressure medications and since then he has been doing well.  He reported in January he did have a syncopal episode, lasted about 10 to 20 seconds with quick regain of consciousness, no confusion.  He reported if he held his head back or to the left he will feel lightheaded like he is going to pass out.   OTHER MEDICAL CONDITIONS: Hypertension, Hyperlipidemia, Obesity, left frontal meningioma (resected)    REVIEW OF SYSTEMS: Full 14 system review of systems performed and negative with exception of: as noted in the HPI   ALLERGIES: Allergies  Allergen Reactions   Methocarbamol  Other (See Comments)    Twitching in sleep    HOME MEDICATIONS: Outpatient Medications Prior to Visit  Medication Sig Dispense Refill   amLODipine  (NORVASC ) 10 MG tablet Take 1 tablet (10 mg total) by mouth daily. 90 tablet 3   cetirizine  (ZYRTEC ) 10 MG tablet TAKE 1 TABLET BY MOUTH EVERY DAY 90 tablet 2   EPINEPHrine  (EPIPEN  2-PAK) 0.3 mg/0.3 mL IJ SOAJ injection Inject 0.3 mg into the muscle as needed for anaphylaxis. 2 each 1   latanoprost (XALATAN) 0.005 % ophthalmic solution SMARTSIG:In Eye(s)      lisinopril  (ZESTRIL ) 20 MG tablet TAKE 1 TABLET BY MOUTH EVERY DAY 30 tablet 6   montelukast  (SINGULAIR ) 10 MG tablet Take 1 tablet (10 mg total) by mouth at bedtime. 90 tablet 1   Olopatadine  HCl 0.2 % SOLN Apply 1 drop to eye daily as needed. (Patient taking differently: Place 1 drop into both eyes daily.) 2.5 mL 5   pregabalin  (LYRICA ) 150 MG capsule Take 1 capsule (150 mg total) by mouth 2 (two) times daily. 120 capsule 2   rosuvastatin  (CRESTOR ) 5 MG tablet Take 1 tablet (5 mg total) by mouth daily. 90 tablet 3   pantoprazole  (PROTONIX ) 40 MG tablet Take 40 mg by mouth every morning.     No facility-administered medications prior to visit.    PAST MEDICAL HISTORY: 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)   Dizziness    Hypercholesterolemia    Hypertension    Neck pain    Shoulder pain    Sleep apnea    uses bipap at home  PAST SURGICAL HISTORY: Past Surgical History:  Procedure Laterality Date   ANKLE SURGERY     BIOPSY  04/21/2022   Procedure: BIOPSY;  Surgeon: Cindie Carlin POUR, DO;  [facility];  Service: Endoscopy;;   BIOPSY  09/15/2022   Procedure: BIOPSY;  Surgeon: Cindie Carlin POUR, DO;  [facility];  Service: Endoscopy;;   brain tumor removal  2014   CARPAL TUNNEL RELEASE Left 08/15/2021   Procedure: LEFT CARPAL TUNNEL RELEASE;  Surgeon: Barbarann Oneil BROCKS, MD;  Location: [facility];  Service: Orthopedics;  Laterality: Left;   CATARACT EXTRACTION W/PHACO Right 10/13/2022   Procedure: CATARACT EXTRACTION PHACO AND INTRAOCULAR LENS PLACEMENT (IOC);  Surgeon: Juli Blunt, MD;  Location: [facility];  Service: Ophthalmology;  Laterality: Right;  CDE 2.87   CATARACT EXTRACTION W/PHACO Left 10/27/2022   Procedure: CATARACT EXTRACTION PHACO AND INTRAOCULAR LENS PLACEMENT (IOC);  Surgeon: Juli Blunt, MD;  Location: [facility];  Service: Ophthalmology;  Laterality: Left;   CDE 3.07   COLONOSCOPY WITH PROPOFOL  N/A 01/13/2019   Procedure: COLONOSCOPY WITH PROPOFOL ;  Surgeon: Shaaron Lamar HERO, MD;  20 mm polyp found in the cecum that was removed via piecemeal hot snare polypectomy. Repeat in 4 months. path tubulovillous adenoma, negative for high-grade dysplasia   COLONOSCOPY WITH PROPOFOL  N/A 01/20/2019   Procedure: COLONOSCOPY WITH PROPOFOL ;  Surgeon: Harvey Margo CROME, MD; large defect (2 cm x 1.5 cm with small visible vessel with stigmata of recent bleed) in the cecum.  3 hemostatic clips placed (MR conditional).  One 3 mm polyp in the cecum, tortuous colon, external and internal hemorrhoids.  Pathology revealed tubular adenoma.   COLONOSCOPY WITH PROPOFOL  N/A 10/06/2021   Procedure: COLONOSCOPY WITH PROPOFOL ;  Surgeon: Cindie Carlin POUR, DO;  [facility];  Service: Endoscopy;  Laterality: N/A;  7:30am   ESOPHAGOGASTRODUODENOSCOPY (EGD) WITH PROPOFOL  N/A 04/21/2022   Procedure: ESOPHAGOGASTRODUODENOSCOPY (EGD) WITH PROPOFOL ;  Surgeon: Cindie Carlin POUR, DO;  [facility];  Service: Endoscopy;  Laterality: N/A;  145pm, asa 3, pt can't come earlier - transportation   ESOPHAGOGASTRODUODENOSCOPY (EGD) WITH PROPOFOL  N/A 09/15/2022   Procedure: ESOPHAGOGASTRODUODENOSCOPY (EGD) WITH PROPOFOL ;  Surgeon: Cindie Carlin POUR, DO;  [facility];  Service: Endoscopy;  Laterality: N/A;  8:00 am , asa 3   HERNIA REPAIR  2010   umbilical   NASAL SEPTOPLASTY W/ TURBINOPLASTY Bilateral 11/30/2020   Procedure: NASAL SEPTOPLASTY WITH BILATERAL  TURBINATE REDUCTION;  Surgeon: Karis Clunes, MD;  Location: [facility];  Service: ENT;  Laterality: Bilateral;   POLYPECTOMY  01/13/2019   Procedure: POLYPECTOMY;  Surgeon: Shaaron Lamar HERO, MD;  [facility];  Service: Endoscopy;;  Large Cecal Polyp    POLYPECTOMY  10/06/2021   Procedure: POLYPECTOMY;  Surgeon: Cindie Carlin POUR, DO;  [facility];  Service: Endoscopy;;   POLYPECTOMY   09/15/2022   Procedure: POLYPECTOMY;  Surgeon: Cindie Carlin POUR, DO;  [facility];  Service: Endoscopy;;   SINOSCOPY     ULNAR TUNNEL RELEASE Left 07/19/2022   Procedure: LEFT CUBITAL TUNNEL RELEASE;  Surgeon: Barbarann Oneil BROCKS, MD;  Location: [facility];  Service: Orthopedics;  Laterality: Left;    FAMILY HISTORY: Family History  Problem Relation Age of Onset   Thyroid  disease Mother    COPD Father    Cancer Maternal Aunt    Cancer Maternal Uncle        back   Cancer Paternal Uncle  throat    Cancer Cousin        prostate   Colon cancer Neg Hx    Colon polyps Neg Hx    Allergic rhinitis Neg Hx    Angioedema Neg Hx    Asthma Neg Hx    Eczema Neg Hx     SOCIAL HISTORY: Social History   Socioeconomic History   Marital status: Married    Spouse name: Not on file   Number of children: Not on file   Years of education: Not on file   Highest education level: Not on file  Occupational History   Not on file  Tobacco Use   Smoking status: Former    Current packs/day: 0.00    Average packs/day: 0.5 packs/day for 2.0 years (1.0 ttl pk-yrs)    Types: Cigarettes    Start date: 03/29/1981    Quit date: 03/30/1983    Years since quitting: 40.5    Passive exposure: Past   Smokeless tobacco: Never  Vaping Use   Vaping status: Never Used  Substance and Sexual Activity   Alcohol use: Not Currently    Comment: occ   Drug use: No   Sexual activity: Yes    Comment: married for 1985, 6 grown kids  Other Topics Concern   Not on file  Social History Narrative   Not on file   Social Drivers of Health   Financial Resource Strain: Not on file  Food Insecurity: Not on file  Transportation Needs: Not on file  Physical Activity: Not on file  Stress: Not on file  Social Connections: Not on file  Intimate Partner Violence: Not on file    PHYSICAL EXAM  GENERAL EXAM/CONSTITUTIONAL: Vitals:  Vitals:   10/08/23 1328  BP: 124/76  Pulse: 73  Resp:  17  SpO2: 95%  Weight: (!) 309 lb 8 oz (140.4 kg)  Height: 5' 10 (1.778 m)   Body mass index is 44.41 kg/m. Wt Readings from Last 3 Encounters:  10/08/23 (!) 309 lb 8 oz (140.4 kg)  09/12/23 (!) 307 lb 6 oz (139.4 kg)  07/19/23 (!) 314 lb (142.4 kg)   Patient is in no distress; well developed, nourished and groomed; neck is supple  MUSCULOSKELETAL: Gait, strength, tone, movements noted in Neurologic exam below. He does have left cervical paraspinal muscle tenderness. There is soft tissue swelling left side of neck.   NEUROLOGIC: MENTAL STATUS:      No data to display         awake, alert, oriented to person, place and time recent and remote memory intact normal attention and concentration language fluent, comprehension intact, naming intact fund of knowledge appropriate  CRANIAL NERVE:  2nd, 3rd, 4th, 6th - visual fields full to confrontation, extraocular muscles intact, no nystagmus 5th - facial sensation symmetric 7th - facial strength symmetric 8th - hearing intact 9th - palate elevates symmetrically, uvula midline 11th - shoulder shrug symmetric 12th - tongue protrusion midline  MOTOR:  normal bulk and tone, full strength in the BUE, BLE   SENSORY:  normal and symmetric to light touch  COORDINATION:  finger-nose-finger, fine finger movements normal   GAIT/STATION:  normal  DIAGNOSTIC DATA (LABS, IMAGING, TESTING) - I reviewed patient records, labs, notes, testing and imaging myself where available.  Lab Results  Component Value Date   WBC 4.1 04/01/2022   HGB 13.9 04/01/2022   HCT 39.7 04/01/2022   MCV 91.3 04/01/2022   PLT 182 04/01/2022  Component Value Date/Time   NA 136 07/18/2022 0849   NA 139 03/31/2019 1053   K 3.8 07/18/2022 0849   CL 98 07/18/2022 0849   CO2 28 07/18/2022 0849   GLUCOSE 105 (H) 07/18/2022 0849   BUN 11 07/18/2022 0849   BUN 13 03/31/2019 1053   CREATININE 1.24 07/18/2022 0849   CALCIUM  9.2 07/18/2022 0849    PROT 7.5 04/01/2022 0846   PROT 7.6 03/31/2019 1053   ALBUMIN 4.5 04/01/2022 0846   ALBUMIN 4.8 03/31/2019 1053   AST 20 04/01/2022 0846   ALT 22 04/01/2022 0846   ALKPHOS 61 04/01/2022 0846   BILITOT 0.6 04/01/2022 0846   BILITOT <0.2 03/31/2019 1053   GFRNONAA >60 07/18/2022 0849   GFRAA 72 03/31/2019 1053   Lab Results  Component Value Date   CHOL 224 (H) 03/31/2019   HDL 46 03/31/2019   LDLCALC 147 (H) 03/31/2019   TRIG 173 (H) 03/31/2019   CHOLHDL 4.9 03/31/2019   No results found for: HGBA1C No results found for: VITAMINB12 No results found for: TSH  MRI Cervical Spine 12/2020 1. 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 2. Mild left foraminal narrowing at C2-3. 3. Moderate right and mild left foraminal narrowing at C3-4. 4. Moderate foraminal narrowing bilaterally at C4-5 and C5-6 is worse on the right. 5. Mild right foraminal narrowing at C6-7. 6. Moderate foraminal narrowing bilaterally at C7-T1 is worse on the left.   MRI Brain 12/2020 1. No acute intracranial abnormality or significant interval change. 2. Stable left frontal encephalomalacia subjacent to the tumor resection. 3. Bilateral exophthalmos   MRI Cervical spine 02/18/2022 1. At C3-4 there is a mild broad-based disc bulge. Bilateral uncovertebral degenerative changes. Moderate right foraminal stenosis. Mild left foraminal stenosis. 2. At C4-5 there is a mild broad-based disc bulge. Mild-moderate bilateral foraminal stenosis. 3. At C5-6 there is a mild broad-based disc bulge. Bilateral uncovertebral degenerative changes. Moderate-severe right foraminal stenosis. Moderate left foraminal stenosis. 4. At C6-7 there is a mild broad-based disc bulge. Mild right foraminal stenosis. 5. At C7-T1 there is a mild broad-based disc bulge. Moderate left foraminal stenosis. 6. No acute osseous injury of the cervical spine.  EMG/NCS 06/08/2022 This is an abnormal study. There is  electrophysiologic evidence of mild to moderate right carpal Tunnel Syndrome.  Abnormal nerve conductions in the left median nerves likely due to prior carpal tunnel syndrome status post decompression as he is asymptomatic for left carpal tunnel syndrome. 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   No denervation in the proximal muscles or cervical paraspinal muscles to suggest cervical radiculopathy.   ASSESSMENT AND PLAN  63 y.o. year old male with history of hypertension, hyperlipidemia, obesity who is presenting for follow up for cervical radiculopathy and carpal tunnel syndrome and gait abnormality.  He is status post left carpal tunnel release surgery and left cubital tunnel release surgery.  Overall doing well.  He is main complaint today is balance problem, we will send him to physical therapy.  Will continue his pregabalin  150 mg twice daily and advised him to continue follow-up with his doctors.  I will see him in 1 year for follow-up or sooner if worse.   1. Bilateral carpal tunnel syndrome   2. Ulnar neuropathy of both  upper extremities   3. Cervical radiculopathy   4. Left shoulder pain, unspecified chronicity   5. Abnormal gait   6. Balance problem      Patient Instructions  Continue current medications Continue follow-up with your doctors Will refer patient to physical therapy for gait training and balance Follow-up in 1 year or sooner if worse.  Orders Placed This Encounter  Procedures   Ambulatory referral to Physical Therapy    No orders of the defined types were placed in this encounter.   Return in about 1 year (around 10/07/2024).   Pastor Falling, MD 10/08/2023, 2:14 PM  [facility] [address], [address] [place], [place] [address] [phone]

## 2023-10-08 NOTE — Patient Instructions (Signed)
 Continue current medications Continue follow-up with your doctors Will refer patient to physical therapy for gait training and balance Follow-up in 1 year or sooner if worse.

## 2023-10-09 ENCOUNTER — Other Ambulatory Visit: Payer: Self-pay | Admitting: Cardiology

## 2023-10-10 ENCOUNTER — Ambulatory Visit: Admitting: Neurology

## 2023-10-10 ENCOUNTER — Encounter: Payer: Self-pay | Admitting: Neurology

## 2023-10-10 ENCOUNTER — Telehealth: Payer: Self-pay | Admitting: Neurology

## 2023-10-10 NOTE — Telephone Encounter (Signed)
 Referral for PT sent to [facility] in [place]. [phone]

## 2023-10-11 NOTE — Telephone Encounter (Signed)
 Spoke with [facility], Shanda; have referral and will call patient by lunch time today to schedule appointment.

## 2023-10-18 DIAGNOSIS — R269 Unspecified abnormalities of gait and mobility: Secondary | ICD-10-CM | POA: Diagnosis not present

## 2023-10-18 DIAGNOSIS — R2689 Other abnormalities of gait and mobility: Secondary | ICD-10-CM | POA: Diagnosis not present

## 2023-10-19 ENCOUNTER — Ambulatory Visit (INDEPENDENT_AMBULATORY_CARE_PROVIDER_SITE_OTHER): Payer: Self-pay

## 2023-10-19 ENCOUNTER — Telehealth: Payer: Self-pay

## 2023-10-19 DIAGNOSIS — R519 Headache, unspecified: Secondary | ICD-10-CM

## 2023-10-19 DIAGNOSIS — J309 Allergic rhinitis, unspecified: Secondary | ICD-10-CM | POA: Diagnosis not present

## 2023-10-19 DIAGNOSIS — J302 Other seasonal allergic rhinitis: Secondary | ICD-10-CM

## 2023-10-19 NOTE — Telephone Encounter (Signed)
 Patient came in for his injection today and expressed that he has stopped the Singular. It cause mood behaviors that he did not like.

## 2023-10-22 ENCOUNTER — Ambulatory Visit: Admitting: Nutrition

## 2023-10-23 DIAGNOSIS — Z6841 Body Mass Index (BMI) 40.0 and over, adult: Secondary | ICD-10-CM | POA: Diagnosis not present

## 2023-10-23 DIAGNOSIS — Z0000 Encounter for general adult medical examination without abnormal findings: Secondary | ICD-10-CM | POA: Diagnosis not present

## 2023-10-23 DIAGNOSIS — Z1331 Encounter for screening for depression: Secondary | ICD-10-CM | POA: Diagnosis not present

## 2023-10-23 DIAGNOSIS — Z299 Encounter for prophylactic measures, unspecified: Secondary | ICD-10-CM | POA: Diagnosis not present

## 2023-10-23 DIAGNOSIS — I10 Essential (primary) hypertension: Secondary | ICD-10-CM | POA: Diagnosis not present

## 2023-10-23 DIAGNOSIS — I471 Supraventricular tachycardia, unspecified: Secondary | ICD-10-CM | POA: Diagnosis not present

## 2023-10-23 DIAGNOSIS — Z1339 Encounter for screening examination for other mental health and behavioral disorders: Secondary | ICD-10-CM | POA: Diagnosis not present

## 2023-10-23 DIAGNOSIS — Z7189 Other specified counseling: Secondary | ICD-10-CM | POA: Diagnosis not present

## 2023-10-23 NOTE — Telephone Encounter (Signed)
Great - thank you for the update!   Salvatore Marvel, MD [facility] [address]

## 2023-10-24 DIAGNOSIS — R2689 Other abnormalities of gait and mobility: Secondary | ICD-10-CM | POA: Diagnosis not present

## 2023-10-24 DIAGNOSIS — R269 Unspecified abnormalities of gait and mobility: Secondary | ICD-10-CM | POA: Diagnosis not present

## 2023-10-26 ENCOUNTER — Ambulatory Visit (INDEPENDENT_AMBULATORY_CARE_PROVIDER_SITE_OTHER)

## 2023-10-26 DIAGNOSIS — J309 Allergic rhinitis, unspecified: Secondary | ICD-10-CM

## 2023-10-26 NOTE — Telephone Encounter (Signed)
 Patient came back on today stating that he was supposed to have a CT scan done for his sinus since he stopped taking the medication.

## 2023-10-30 NOTE — Telephone Encounter (Signed)
 Signed pt orders faxed to [facility] at [phone]

## 2023-10-31 DIAGNOSIS — R2689 Other abnormalities of gait and mobility: Secondary | ICD-10-CM | POA: Diagnosis not present

## 2023-10-31 DIAGNOSIS — R269 Unspecified abnormalities of gait and mobility: Secondary | ICD-10-CM | POA: Diagnosis not present

## 2023-11-07 NOTE — Telephone Encounter (Signed)
 Sinus CT ordered.

## 2023-11-07 NOTE — Addendum Note (Signed)
 Addended by: IVA MARTY SALTNESS on: 11/07/2023 04:50 PM   Modules accepted: Orders

## 2023-11-08 NOTE — Telephone Encounter (Signed)
 I called and left a voicemail for a return call. Patient just needs to be informed that he has a CT ordered.

## 2023-11-08 NOTE — Telephone Encounter (Signed)
 Patient called back regarding voicemail on his CT scan. Patient was informed that Nurse will call him back with a set time/date regarding the CT scan as a PA may need to be put in place first before scheduling. Patient verbalize understanding.

## 2023-11-09 ENCOUNTER — Ambulatory Visit (INDEPENDENT_AMBULATORY_CARE_PROVIDER_SITE_OTHER): Payer: Self-pay

## 2023-11-09 DIAGNOSIS — J309 Allergic rhinitis, unspecified: Secondary | ICD-10-CM

## 2023-11-12 ENCOUNTER — Other Ambulatory Visit: Payer: Self-pay | Admitting: *Deleted

## 2023-11-12 ENCOUNTER — Encounter: Payer: Self-pay | Admitting: Cardiology

## 2023-11-12 MED ORDER — ROSUVASTATIN CALCIUM 5 MG PO TABS: 5.0000 mg | ORAL_TABLET | Freq: Every day | ORAL | 1 refills | Status: AC

## 2023-11-13 DIAGNOSIS — R2689 Other abnormalities of gait and mobility: Secondary | ICD-10-CM | POA: Diagnosis not present

## 2023-11-13 DIAGNOSIS — R269 Unspecified abnormalities of gait and mobility: Secondary | ICD-10-CM | POA: Diagnosis not present

## 2023-11-14 ENCOUNTER — Ambulatory Visit (INDEPENDENT_AMBULATORY_CARE_PROVIDER_SITE_OTHER): Payer: Self-pay

## 2023-11-14 DIAGNOSIS — J309 Allergic rhinitis, unspecified: Secondary | ICD-10-CM

## 2023-11-21 DIAGNOSIS — R2689 Other abnormalities of gait and mobility: Secondary | ICD-10-CM | POA: Diagnosis not present

## 2023-11-21 DIAGNOSIS — R269 Unspecified abnormalities of gait and mobility: Secondary | ICD-10-CM | POA: Diagnosis not present

## 2023-11-28 DIAGNOSIS — R2689 Other abnormalities of gait and mobility: Secondary | ICD-10-CM | POA: Diagnosis not present

## 2023-11-28 DIAGNOSIS — R269 Unspecified abnormalities of gait and mobility: Secondary | ICD-10-CM | POA: Diagnosis not present

## 2023-11-30 ENCOUNTER — Ambulatory Visit (INDEPENDENT_AMBULATORY_CARE_PROVIDER_SITE_OTHER): Payer: Self-pay

## 2023-11-30 DIAGNOSIS — J309 Allergic rhinitis, unspecified: Secondary | ICD-10-CM | POA: Diagnosis not present

## 2023-12-03 DIAGNOSIS — G4733 Obstructive sleep apnea (adult) (pediatric): Secondary | ICD-10-CM | POA: Diagnosis not present

## 2023-12-04 ENCOUNTER — Ambulatory Visit: Payer: Self-pay | Admitting: Nutrition

## 2023-12-04 ENCOUNTER — Encounter: Payer: Self-pay | Attending: Internal Medicine | Admitting: Nutrition

## 2023-12-04 ENCOUNTER — Encounter: Payer: Self-pay | Admitting: Nutrition

## 2023-12-04 DIAGNOSIS — E782 Mixed hyperlipidemia: Secondary | ICD-10-CM | POA: Insufficient documentation

## 2023-12-04 DIAGNOSIS — N183 Chronic kidney disease, stage 3 unspecified: Secondary | ICD-10-CM | POA: Insufficient documentation

## 2023-12-04 NOTE — Progress Notes (Signed)
 Medical Nutrition Therapy  Appointment Start time:  1330  Appointment End time:  1415  Primary concerns today: Obesity  Referral diagnosis: E66.01 Preferred learning style: No Preference  Learning readiness: Ready   NUTRITION ASSESSMENT obesity , hyperlipidemia and CKD follow up Unsure if he is seeing anyone for CKD. Creatine 1.46 mg/dl and EGFR 54 low. Has had a lot of traumatic stress in his life for the last 4 months with family member being serious ill. He is riding his exercise bike 90 minutes 4 times per week and really enjoys it. Willing to change up some exercise to start walking some in his house. Only gained 1 lbs since last visit with me. Still strives to eat better and be more active. Sees his cardiologist, Dr. Alvan soon.  Lipid profile is improved. LDL 73, down from 174. Is on Crestor . Has cut out sweets and a lot of processed foods.  Not ready for mental health services for his stress in his life.  Basic Metabolic Panel Specimen: Blood Component Ref Range & Units 5 mo ago Comments  Sodium 135 - 145 mmol/L 138   Potassium 3.5 - 5.0 mmol/L 3.8   Chloride 98 - 107 mmol/L 99   CO2 21.0 - 32.0 mmol/L 29.1   Anion Gap 3 - 11 mmol/L 10   BUN 8 - 20 mg/dL 11   Creatinine 9.19 - 1.30 mg/dL 8.53 High    BUN/Creatinine Ratio 8   eGFR CKD-EPI (2021) Male >=60 mL/min/1.22m2 54 Low     Lipid Panel Specimen: Blood Component Ref Range & Units 3 mo ago Comments  Cholesterol, Total <200 mg/dL 871   Cholesterol, HDL >40 mg/dL 45   Cholesterol, LDL, Calculated <100 mg/dL 73 Calculated LDL-Cholesterol utilizes the 2024 modified [employer].  Cholesterol, Non-HDL, Calculated <130 mg/dL 83   Triglycerides <849 mg/dL 67    Clinical Wt Readings from Last 3 Encounters:  10/08/23 (!) 309 lb 8 oz (140.4 kg)  09/12/23 (!) 307 lb 6 oz (139.4 kg)  07/19/23 (!) 314 lb (142.4 kg)   Ht Readings from Last 3 Encounters:  10/08/23 5' 10 (1.778 m)  09/12/23 5' 8 (1.727  m)  07/19/23 5' 10 (1.778 m)   There is no height or weight on file to calculate BMI. @BMIFA @ Facility age limit for growth %iles is 20 years. Facility age limit for growth %iles is 20 years.  Lipid Panel     Component Value Date/Time   CHOL 224 (H) 03/31/2019 1053   TRIG 173 (H) 03/31/2019 1053   HDL 46 03/31/2019 1053   CHOLHDL 4.9 03/31/2019 1053   LDLCALC 147 (H) 03/31/2019 1053   LABVLDL 31 03/31/2019 1053      Latest Ref Rng & Units 07/18/2022    8:49 AM 04/01/2022    8:46 AM 08/05/2021   10:52 AM  CMP  Glucose 70 - 99 mg/dL 894  898    BUN 8 - 23 mg/dL 11  11    Creatinine 9.38 - 1.24 mg/dL 8.75  8.84  8.69   Sodium 135 - 145 mmol/L 136  139    Potassium 3.5 - 5.1 mmol/L 3.8  4.1    Chloride 98 - 111 mmol/L 98  105    CO2 22 - 32 mmol/L 28  26    Calcium  8.9 - 10.3 mg/dL 9.2  9.1    Total Protein 6.5 - 8.1 g/dL  7.5    Total Bilirubin 0.3 - 1.2 mg/dL  0.6  Alkaline Phos 38 - 126 U/L  61    AST 15 - 41 U/L  20    ALT 0 - 44 U/L  22     Medical Hx:  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 90 tablet 3   cetirizine  (ZYRTEC ) 10 MG tablet TAKE 1 TABLET BY MOUTH EVERY DAY 90 tablet 2   EPINEPHrine  (EPIPEN  2-PAK) 0.3 mg/0.3 mL IJ SOAJ injection Inject 0.3 mg into the muscle as needed for anaphylaxis. 2 each 1   latanoprost (XALATAN) 0.005 % ophthalmic solution SMARTSIG:In Eye(s)     lisinopril  (ZESTRIL ) 20 MG tablet TAKE 1 TABLET BY MOUTH EVERY DAY 30 tablet 6   montelukast  (SINGULAIR ) 10 MG tablet Take 1 tablet (10 mg total) by mouth at bedtime. 90 tablet 1   Olopatadine  HCl 0.2 % SOLN Apply 1  drop to eye daily as needed. (Patient taking differently: Place 1 drop into both eyes daily.) 2.5 mL 5   pregabalin  (LYRICA ) 150 MG capsule Take 1 capsule (150 mg total) by mouth 2 (two) times daily. 120 capsule 2   rosuvastatin  (CRESTOR ) 5 MG tablet Take 1 tablet (5 mg total) by mouth daily. 90 tablet 1   No current facility-administered medications on file prior to visit.    Labs:LDL 111 mg/dl.  Notable Signs/Symptoms: None  Lifestyle & Dietary Hx Lives with his wife. His wife does the cooking and shopping.  Estimated daily fluid intake: 40 oz Supplements:  Sleep:  Stress / self-care:  Current average weekly physical activity: ADL  24-Hr Dietary Recall  Estimated Energy Needs Calories: 1600 Carbohydrate: 180g Protein: 120g Fat: 44g   NUTRITION DIAGNOSIS  NI-1.7 Predicted excessive energy intake As related to Excessive/High calorie die with 17 lbs weight gain in 6 months.  As evidenced by BMI 46.   NUTRITION INTERVENTION  Nutrition education (E-1) on the following topics:  Nutrition and Diabetes education provided on My Plate, CHO counting, meal planning, portion sizes, timing of meals, avoiding snacks between meals unless having a low blood sugar, target ranges for A1C and blood sugars, signs/symptoms and treatment of hyper/hypoglycemia, monitoring blood sugars, taking medications as prescribed, benefits of exercising 30 minutes per day and prevention of complications of DM.  Weight loss tips  Handouts Provided Include  Lifestyle Medicine handouts  Learning Style & Readiness for Change Teaching method utilized: Visual & Auditory  Demonstrated degree of understanding via: Teach Back  Barriers to learning/adherence to lifestyle change: size may effect mobility issues for needed exercise.  Goals Established by Pt  Goals  Weight 295 lbs. Increase fruits and vegetables  to 5 per day Walking twice a week-on treadmill 7 minutes at time  MONITORING &  EVALUATION Dietary intake, weekly physical activity, and  in 1 month.

## 2023-12-04 NOTE — Patient Instructions (Signed)
 Goals  Weight 295 lbs. Increase fruits and vegetables  to 5 per day Walking twice a week-on treadmill 7 minutes at time

## 2023-12-06 ENCOUNTER — Encounter: Payer: Self-pay | Admitting: Cardiology

## 2023-12-06 ENCOUNTER — Ambulatory Visit: Attending: Cardiology | Admitting: Cardiology

## 2023-12-06 VITALS — BP 126/78 | HR 66 | Ht 70.0 in | Wt 309.2 lb

## 2023-12-06 DIAGNOSIS — I10 Essential (primary) hypertension: Secondary | ICD-10-CM | POA: Diagnosis not present

## 2023-12-06 DIAGNOSIS — G4733 Obstructive sleep apnea (adult) (pediatric): Secondary | ICD-10-CM | POA: Diagnosis not present

## 2023-12-06 DIAGNOSIS — Z79899 Other long term (current) drug therapy: Secondary | ICD-10-CM

## 2023-12-06 DIAGNOSIS — I272 Pulmonary hypertension, unspecified: Secondary | ICD-10-CM | POA: Diagnosis not present

## 2023-12-06 NOTE — Progress Notes (Signed)
 Clinical Summary Micheal Ross is a 63 y.o.male seen today for follow up of the following medical problems.    1. HTN - fatigue on clonidine  which had been started by another provider, has weaned off.  - Cr declined, we stopped chlorthalidone  07/2023 - - dizziness on higher lisinopril  dosing.  - home bp's 110s-120s/70s      2.Possible pulmonary HTN - CTA Jan 2024 Central pulmonary arteries  are enlarged in keeping with changes of pulmonary arterial  hypertension.  - - 04/2021 echo pcp office: grossly normal LVEF, limited visualization. Grossly no major valve abnormalitiy - he does have history of OSA   06/2022 echo: LVEF 60-65%, grade I dd, normal RV size and function, inadequate TR jet to estimated PASP    3. OSA - compliant with cpap - followed by Dr Shellia, last seen in May 2024   4. HLD - Jan 2024 TC 185 TG 88 HDL 45 LDL 124 - he is on crestor  5mg  daily - reports upcoming labs with pcp   Jan 2025 TC 187 TG 96 HDL 57 LDL 111 08/2023 TC 128 HDL 45 LDL 73 TG 67         Other medical issues not addressed this visit   1.Syncope - seen in ER 04/06/21 - was having abdominal and neck pain at the time - EKG NSR, benign labs - 04/2021 echo pcp office: grossly normal LVEF, limited visualization. Grossly no major valve abnormalitiy - pcp 14 day zio patch: min HR 50, max HR 152, Avg HR 73. Rare supraventricular ectopy, three runs of SVT longest 8 beats. Frequent isolated PVCs (6.3%), no NSVT or VT.      - symptoms started Jan 30 - Jan 31 in bathroom. Stood up to walk to bathroom, walked to bathroom. Washing your hands. Felt lightheaded, then passed out and felt going down. Came to quickly, very brief LOC. Got up and family helped back to bedroom. Rested and layed in bed - no issues leading up to that day    Lightheadedness, vision blurry episodes daily. Can occur with with sitting or standing. Can be laying down and symptoms. Some palpitatons at times.    Sometimes if  laying on left side and tilts head to left, feels like he may pass out. Resolves with position change.  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 90 tablet 3   cetirizine  (ZYRTEC ) 10 MG tablet TAKE 1 TABLET BY MOUTH EVERY DAY 90 tablet 2   EPINEPHrine  (EPIPEN  2-PAK) 0.3 mg/0.3 mL IJ SOAJ injection Inject 0.3 mg into the muscle as needed for anaphylaxis. 2 each 1   latanoprost (XALATAN) 0.005 % ophthalmic solution SMARTSIG:In Eye(s)  lisinopril  (ZESTRIL ) 20 MG tablet TAKE 1 TABLET BY MOUTH EVERY DAY 30 tablet 6   montelukast  (SINGULAIR ) 10 MG tablet Take 1 tablet (10 mg total) by mouth at bedtime. 90 tablet 1   Olopatadine  HCl 0.2 % SOLN Apply 1 drop to eye daily as needed. (Patient taking differently: Place 1 drop into both eyes daily.) 2.5 mL 5   pregabalin  (LYRICA ) 150 MG capsule Take 1 capsule (150 mg total) by mouth 2 (two) times daily. 120 capsule 2   rosuvastatin  (CRESTOR ) 5 MG tablet Take 1 tablet (5 mg total) by mouth daily. 90 tablet 1   No current facility-administered medications for this visit.     Past Surgical History:  Procedure Laterality Date   ANKLE SURGERY     BIOPSY  04/21/2022   Procedure: BIOPSY;  Surgeon: Cindie Carlin POUR, DO;  Location: [facility];  Service: Endoscopy;;   BIOPSY  09/15/2022    Procedure: BIOPSY;  Surgeon: Cindie Carlin POUR, DO;  [facility]: [facility];  Service: Endoscopy;;   brain tumor removal  2014   CARPAL TUNNEL RELEASE Left 08/15/2021   Procedure: LEFT CARPAL TUNNEL RELEASE;  Surgeon: Barbarann Oneil BROCKS, MD;  Location: [facility];  Service: Orthopedics;  Laterality: Left;   CATARACT EXTRACTION W/PHACO Right 10/13/2022   Procedure: CATARACT EXTRACTION PHACO AND INTRAOCULAR LENS PLACEMENT (IOC);  Surgeon: Juli Blunt, MD;  Location: [facility];  Service: Ophthalmology;  Laterality: Right;  CDE 2.87   CATARACT EXTRACTION W/PHACO Left 10/27/2022   Procedure: CATARACT EXTRACTION PHACO AND INTRAOCULAR LENS PLACEMENT (IOC);  Surgeon: Juli Blunt, MD;  Location: [facility];  Service: Ophthalmology;  Laterality: Left;  CDE 3.07   COLONOSCOPY WITH PROPOFOL  N/A 01/13/2019   Procedure: COLONOSCOPY WITH PROPOFOL ;  Surgeon: Shaaron Lamar HERO, MD;  20 mm polyp found in the cecum that was removed via piecemeal hot snare polypectomy. Repeat in 4 months. path tubulovillous adenoma, negative for high-grade dysplasia   COLONOSCOPY WITH PROPOFOL  N/A 01/20/2019   Procedure: COLONOSCOPY WITH PROPOFOL ;  Surgeon: Harvey Margo CROME, MD; large defect (2 cm x 1.5 cm with small visible vessel with stigmata of recent bleed) in the cecum.  3 hemostatic clips placed (MR conditional).  One 3 mm polyp in the cecum, tortuous colon, external and internal hemorrhoids.  Pathology revealed tubular adenoma.   COLONOSCOPY WITH PROPOFOL  N/A 10/06/2021   Procedure: COLONOSCOPY WITH PROPOFOL ;  Surgeon: Cindie Carlin POUR, DO;  Location: [facility];  Service: Endoscopy;  Laterality: N/A;  7:30am   ESOPHAGOGASTRODUODENOSCOPY (EGD) WITH PROPOFOL  N/A 04/21/2022   Procedure: ESOPHAGOGASTRODUODENOSCOPY (EGD) WITH PROPOFOL ;  Surgeon: Cindie Carlin POUR, DO;  Location: [facility];  Service: Endoscopy;  Laterality: N/A;  145pm, asa 3, pt can't come earlier - transportation   ESOPHAGOGASTRODUODENOSCOPY (EGD)  WITH PROPOFOL  N/A 09/15/2022   Procedure: ESOPHAGOGASTRODUODENOSCOPY (EGD) WITH PROPOFOL ;  Surgeon: Cindie Carlin POUR, DO;  Location: [facility];  Service: Endoscopy;  Laterality: N/A;  8:00 am , asa 3   HERNIA REPAIR  2010   umbilical   NASAL SEPTOPLASTY W/ TURBINOPLASTY Bilateral 11/30/2020   Procedure: NASAL SEPTOPLASTY WITH BILATERAL  TURBINATE REDUCTION;  Surgeon: Karis Clunes, MD;  Location: [facility];  Service: ENT;  Laterality: Bilateral;   POLYPECTOMY  01/13/2019   Procedure: POLYPECTOMY;  Surgeon: Shaaron Lamar HERO, MD;  Location: [facility];  Service: Endoscopy;;  Large Cecal Polyp    POLYPECTOMY  10/06/2021   Procedure: POLYPECTOMY;  Surgeon: Cindie Carlin POUR, DO;  Location:  [facility];  Service: Endoscopy;;   POLYPECTOMY  09/15/2022   Procedure: POLYPECTOMY;  Surgeon: Cindie Carlin POUR, DO;  Location: [facility];  Service: Endoscopy;;   SINOSCOPY     ULNAR TUNNEL RELEASE Left 07/19/2022   Procedure: LEFT CUBITAL TUNNEL RELEASE;  Surgeon: Barbarann Oneil BROCKS, MD;  [facility];  Service: Orthopedics;  Laterality: Left;     Allergies  Allergen Reactions   Methocarbamol  Other (See Comments)    Twitching in sleep      Family History  Problem Relation Age of Onset   Thyroid  disease Mother    COPD Father    Cancer Maternal Aunt    Cancer Maternal Uncle        back   Cancer Paternal Uncle        throat    Cancer Cousin        prostate   Colon cancer Neg Hx    Colon polyps Neg Hx    Allergic rhinitis Neg Hx    Angioedema Neg Hx    Asthma Neg Hx    Eczema Neg Hx      Social History Mr. Stann reports that he quit smoking about 40 years ago. His smoking use included cigarettes. He started smoking about 42 years ago. He has a 1 pack-year smoking history. He has been exposed to tobacco smoke. He has never used smokeless tobacco. Mr. Haslam reports that he does not currently use alcohol.    Physical Examination Today's Vitals    12/06/23 0922  BP: 126/78  Pulse: 66  SpO2: 98%  Weight: (!) 309 lb 3.2 oz (140.3 kg)  Height: 5' 10 (1.778 m)   Body mass index is 44.37 kg/m.  Gen: resting comfortably, no acute distress HEENT: no scleral icterus, pupils equal round and reactive, no palptable cervical adenopathy,  CV: RRR, no mrg, no jvd Resp: Clear to auscultation bilaterally GI: abdomen is soft, non-tender, non-distended, normal bowel sounds, no hepatosplenomegaly MSK: extremities are warm, no edema.  Skin: warm, no rash Neuro:  no focal deficits Psych: appropriate affect    Assessment and Plan   HTN -bp is at goal - we stopped chorthalidone due to uptrending Cr, will repeat bmet now off medciation   2. Possible pulmonary HTN - suggested by recent CTA. He does have grade I dd, OSA.  - echos have had inadequate TR jet not able to estimate PASP. Have shown normal RV and RA. In absence of symptoms with normal RA/RV would not pursue additional testing at this time.  - will repeat echo   3. HLD - cholsterol at goal, continue current meds   4.OSA - former patient of Dr Magdaleno, needs to reestablish with pulmonary, will refer   Dorn PHEBE Ross, M.D

## 2023-12-06 NOTE — Patient Instructions (Addendum)
 Medication Instructions:   Continue all current medications.   Labwork:  BMET - order given today  Testing/Procedures:  Your physician has requested that you have an echocardiogram. Echocardiography is a painless test that uses sound waves to create images of your heart. It provides your doctor with information about the size and shape of your heart and how well your heart's chambers and valves are working. This procedure takes approximately one hour. There are no restrictions for this procedure. Please do NOT wear cologne, perfume, aftershave, or lotions (deodorant is allowed). Please arrive 15 minutes prior to your appointment time.  Please note: We ask at that you not bring children with you during ultrasound (echo/ vascular) testing. Due to room size and safety concerns, children are not allowed in the ultrasound rooms during exams. Our front office staff cannot provide observation of children in our lobby area while testing is being conducted. An adult accompanying a patient to their appointment will only be allowed in the ultrasound room at the discretion of the ultrasound technician under special circumstances. We apologize for any inconvenience.   Follow-Up:  Office will contact with results via phone, letter or mychart.    6 months   Any Other Special Instructions Will Be Listed Below (If Applicable).  You have been referred to:  Pulmonology   If you need a refill on your cardiac medications before your next appointment, please call your pharmacy.

## 2023-12-07 ENCOUNTER — Ambulatory Visit (INDEPENDENT_AMBULATORY_CARE_PROVIDER_SITE_OTHER)

## 2023-12-07 DIAGNOSIS — J309 Allergic rhinitis, unspecified: Secondary | ICD-10-CM | POA: Diagnosis not present

## 2023-12-11 DIAGNOSIS — Z23 Encounter for immunization: Secondary | ICD-10-CM | POA: Diagnosis not present

## 2023-12-11 DIAGNOSIS — R269 Unspecified abnormalities of gait and mobility: Secondary | ICD-10-CM | POA: Diagnosis not present

## 2023-12-11 DIAGNOSIS — R2689 Other abnormalities of gait and mobility: Secondary | ICD-10-CM | POA: Diagnosis not present

## 2023-12-14 ENCOUNTER — Ambulatory Visit: Admitting: Allergy & Immunology

## 2023-12-14 ENCOUNTER — Other Ambulatory Visit: Payer: Self-pay

## 2023-12-14 ENCOUNTER — Encounter: Payer: Self-pay | Admitting: Allergy & Immunology

## 2023-12-14 VITALS — BP 130/80 | HR 82 | Temp 97.5°F | Resp 18 | Ht 68.9 in | Wt 307.8 lb

## 2023-12-14 DIAGNOSIS — J3089 Other allergic rhinitis: Secondary | ICD-10-CM

## 2023-12-14 DIAGNOSIS — R519 Headache, unspecified: Secondary | ICD-10-CM | POA: Diagnosis not present

## 2023-12-14 DIAGNOSIS — J302 Other seasonal allergic rhinitis: Secondary | ICD-10-CM

## 2023-12-14 DIAGNOSIS — J330 Polyp of nasal cavity: Secondary | ICD-10-CM | POA: Diagnosis not present

## 2023-12-14 NOTE — Progress Notes (Signed)
 FOLLOW UP  Date of Service/Encounter:  12/14/23   Assessment:   Perennial and seasonal allergic rhinitis (trees, molds) - allergen immunotherapy maintenance reached May 2025   Chronic headaches and nasal congestion - slightly improve with the allergen immunotherapyPerennial and seasonal allergic rhinitis (trees, molds) - allergen immunotherapy maintenance reached May 2025   Chronic headaches and nasal congestion - slightly improve with the allergen immunotherapy  Scattered nasal polyps on CT from Feb 2024 (does not tolerate nasal steroids due to epistaxis)   Plan/Recommendations:   Allergic Rhinitis (trees, molds) - on allergy  shots - Continue to avoid the nose sprays since you are getting nose bleeds. - Continue with Zyrtec  10 mg daily.  - Continue with allergy  shots for long term control. - We will follow up for the sinus CT.  - We may consider doing something like Dupixent for nasal polys (information provided). - Nasal steroids are typically the first line treatment for nasal polyps, but you cannot tolerate them.   2. Return in about 3 months (around 03/15/2024). You can have the follow up appointment with Dr. Iva or a Nurse Practicioner (our Nurse Practitioners are excellent and always have Physician oversight!).   Subjective:   Micheal Ross. is a 63 y.o. male presenting today for follow up of  Chief Complaint  Patient presents with   Follow-up    Micheal Ross. 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  Discussed the use of AI scribe software for clinical note transcription with the patient and/or guardian, who gave verbal consent to proceed.  Micheal Ross is a 63 y.o. male presenting for a follow up visit.  He was last seen in July 2025.  At that time, we added on Singulair  to see if that would help with his nasal congestion.  We continue with Zyrtec  and allergy  shots.  We did talk about doing a sinus CT as well.  Since the last visit, he has done well.  Asthma/Respiratory Symptom History: He has a history of sleep apnea, which he has not followed up on in two years, and is apprehensive about returning to his sleep specialist.  Allergic Rhinitis Symptom History: He recalls having surgery in 2020 or 2021 to trim nasal tissue, but polyps were not mentioned at that time. He reports that montelukast  ineffective as there was no improvement in symptoms after two days. He previously used nasal sprays but discontinued them due to epistaxis. No issues with his sense of smell, and breathing is generally fine, with occasional issues.  His nasal symptoms are definitely improved since starting allergy  immunotherapy.  He does have a CT scheduled for Monday.  Demondre is on allergen immunotherapy. He receives two injections. Immunotherapy script #  1 contains trees. He currently receives 0.46mL of the RED vial (1/100). Immunotherapy script #2 contains molds. He currently receives 0.80mL of the RED vial (1/100). He started shots November of 2024 and reached maintenance in May of 2025.  He has been managing his blood pressure, which has improved significantly. He attributes this improvement to  lifestyle changes, including losing 20 pounds over the last three months by eliminating bread and sweets from his diet and engaging in daily cycling exercises.   Otherwise, there have been no changes to his past medical history, surgical history, family history, or social history.    Review of systems otherwise negative other than that mentioned in the HPI.    Objective:   Blood pressure 130/80, pulse 82, temperature (!) 97.5 F (36.4 C), temperature source Temporal, resp. rate 18, height 5' 8.9 (1.75 m), weight (!) 307 lb 12.8 oz (139.6 kg), SpO2 95%. Body mass index is 45.59 kg/m.    Physical Exam Vitals reviewed.  Constitutional:      Appearance: He is well-developed.  HENT:     Head: Normocephalic and atraumatic.     Right Ear: Tympanic membrane, ear canal and external ear normal. No drainage, swelling or tenderness. Tympanic membrane is not injected, scarred, erythematous, retracted or bulging.     Left Ear: Tympanic membrane, ear canal and external ear normal. No drainage, swelling or tenderness. Tympanic membrane is not injected, scarred, erythematous, retracted or bulging.     Nose: No nasal deformity, septal deviation, mucosal edema or rhinorrhea.     Right Turbinates: Enlarged, swollen and pale.     Left Turbinates: Enlarged, swollen and pale.     Right Sinus: No maxillary sinus tenderness or frontal sinus tenderness.     Left Sinus: No maxillary sinus tenderness or frontal sinus tenderness.     Comments: There might be 1 polyp appreciated in the right nasal cavity, but otherwise the turbinates are just massively enlarged.     Mouth/Throat:     Mouth: Mucous membranes are not pale and not dry.     Pharynx: Uvula midline.  Eyes:     General:        Right eye: No discharge.        Left eye: No discharge.     Conjunctiva/sclera: Conjunctivae normal.     Right eye: Right conjunctiva is not injected. No chemosis.    Left eye: Left conjunctiva is not injected. No  chemosis.    Pupils: Pupils are equal, round, and reactive to light.  Cardiovascular:     Rate and Rhythm: Normal rate and regular rhythm.     Heart sounds: Normal heart sounds.  Pulmonary:     Effort: Pulmonary effort is normal. No tachypnea, accessory muscle usage or respiratory distress.     Breath sounds: Normal breath sounds. No wheezing, rhonchi or rales.  Chest:     Chest wall: No tenderness.  Abdominal:     Tenderness: There is no abdominal tenderness. There is no guarding or rebound.  Lymphadenopathy:     Head:     Right side of head: No submandibular, tonsillar or occipital adenopathy.     Left side of head: No submandibular, tonsillar or occipital adenopathy.     Cervical: No cervical adenopathy.  Skin:    Coloration: Skin is not pale.     Findings: No abrasion, erythema, petechiae or rash. Rash is not papular, urticarial or vesicular.  Neurological:     Mental Status: He is alert.  Psychiatric:  Behavior: Behavior is cooperative.      Diagnostic studies: none       Marty Shaggy, MD  [facility] 

## 2023-12-14 NOTE — Patient Instructions (Addendum)
 Allergic Rhinitis (trees, molds) - on allergy  shots - Continue to avoid the nose sprays since you are getting nose bleeds. - Continue with Zyrtec  10 mg daily.  - Continue with allergy  shots for long term control. - We will follow up for the sinus CT.  - We may consider doing something like Dupixent for nasal polys (information provided). - Nasal steroids are typically the first line treatment for nasal polyps, but you cannot tolerate them.   2. Return in about 3 months (around 03/15/2024). You can have the follow up appointment with Dr. Iva or a Nurse Practicioner (our Nurse Practitioners are excellent and always have Physician oversight!).    Please inform us  of any Emergency Department visits, hospitalizations, or changes in symptoms. Call us  before going to the ED for breathing or allergy  symptoms since we might be able to fit you in for a sick visit. Feel free to contact us  anytime with any questions, problems, or concerns.  It was a pleasure to see you again today!  Websites that have reliable patient information: 1. American Academy of Asthma, Allergy , and Immunology: www.aaaai.org 2. Food Allergy  Research and Education (FARE): foodallergy.org 3. Mothers of Asthmatics: http://www.asthmacommunitynetwork.org 4. [employer] of Allergy , Asthma, and Immunology: www.acaai.org      "Like" us  on Facebook and Instagram for our latest updates!      A healthy democracy works best when [employer] participate! Make sure you are registered to vote! If you have moved or changed any of your contact information, you will need to get this updated before voting! Scan the QR codes below to learn more!

## 2023-12-17 ENCOUNTER — Ambulatory Visit (HOSPITAL_COMMUNITY)
Admission: RE | Admit: 2023-12-17 | Discharge: 2023-12-17 | Disposition: A | Discharge status: Home / Self Care | Source: Ambulatory Visit | Attending: Allergy & Immunology | Admitting: Allergy & Immunology

## 2023-12-17 DIAGNOSIS — J302 Other seasonal allergic rhinitis: Secondary | ICD-10-CM | POA: Diagnosis not present

## 2023-12-17 DIAGNOSIS — J3089 Other allergic rhinitis: Secondary | ICD-10-CM | POA: Insufficient documentation

## 2023-12-17 DIAGNOSIS — R519 Headache, unspecified: Secondary | ICD-10-CM | POA: Diagnosis not present

## 2023-12-17 DIAGNOSIS — J309 Allergic rhinitis, unspecified: Secondary | ICD-10-CM | POA: Diagnosis not present

## 2023-12-19 DIAGNOSIS — R269 Unspecified abnormalities of gait and mobility: Secondary | ICD-10-CM | POA: Diagnosis not present

## 2023-12-19 DIAGNOSIS — Z79899 Other long term (current) drug therapy: Secondary | ICD-10-CM | POA: Diagnosis not present

## 2023-12-19 DIAGNOSIS — R2689 Other abnormalities of gait and mobility: Secondary | ICD-10-CM | POA: Diagnosis not present

## 2023-12-19 DIAGNOSIS — I10 Essential (primary) hypertension: Secondary | ICD-10-CM | POA: Diagnosis not present

## 2023-12-20 ENCOUNTER — Ambulatory Visit: Attending: Cardiology

## 2023-12-20 DIAGNOSIS — I272 Pulmonary hypertension, unspecified: Secondary | ICD-10-CM

## 2023-12-21 ENCOUNTER — Ambulatory Visit (INDEPENDENT_AMBULATORY_CARE_PROVIDER_SITE_OTHER): Payer: Self-pay

## 2023-12-21 DIAGNOSIS — J309 Allergic rhinitis, unspecified: Secondary | ICD-10-CM | POA: Diagnosis not present

## 2023-12-21 LAB — ECHOCARDIOGRAM COMPLETE
AR max vel: 1.95 cm2
AV Area VTI: 2.11 cm2
AV Area mean vel: 1.98 cm2
AV Mean grad: 5.7 mmHg
AV Peak grad: 11.3 mmHg
Ao pk vel: 1.68 m/s
Area-P 1/2: 4.39 cm2
Calc EF: 68.4 %
S' Lateral: 3.7 cm
Single Plane A2C EF: 65.5 %
Single Plane A4C EF: 69.4 %

## 2023-12-25 DIAGNOSIS — I471 Supraventricular tachycardia, unspecified: Secondary | ICD-10-CM | POA: Diagnosis not present

## 2023-12-25 DIAGNOSIS — H8190 Unspecified disorder of vestibular function, unspecified ear: Secondary | ICD-10-CM | POA: Diagnosis not present

## 2023-12-25 DIAGNOSIS — Z6841 Body Mass Index (BMI) 40.0 and over, adult: Secondary | ICD-10-CM | POA: Diagnosis not present

## 2023-12-25 DIAGNOSIS — I10 Essential (primary) hypertension: Secondary | ICD-10-CM | POA: Diagnosis not present

## 2023-12-25 DIAGNOSIS — Z299 Encounter for prophylactic measures, unspecified: Secondary | ICD-10-CM | POA: Diagnosis not present

## 2023-12-25 DIAGNOSIS — G4733 Obstructive sleep apnea (adult) (pediatric): Secondary | ICD-10-CM | POA: Diagnosis not present

## 2023-12-27 DIAGNOSIS — R269 Unspecified abnormalities of gait and mobility: Secondary | ICD-10-CM | POA: Diagnosis not present

## 2023-12-27 DIAGNOSIS — R2689 Other abnormalities of gait and mobility: Secondary | ICD-10-CM | POA: Diagnosis not present

## 2023-12-28 ENCOUNTER — Encounter (INDEPENDENT_AMBULATORY_CARE_PROVIDER_SITE_OTHER): Payer: Self-pay

## 2023-12-28 ENCOUNTER — Ambulatory Visit: Payer: Self-pay | Admitting: Allergy & Immunology

## 2023-12-28 DIAGNOSIS — J341 Cyst and mucocele of nose and nasal sinus: Secondary | ICD-10-CM

## 2024-01-02 DIAGNOSIS — R2689 Other abnormalities of gait and mobility: Secondary | ICD-10-CM | POA: Diagnosis not present

## 2024-01-02 DIAGNOSIS — R269 Unspecified abnormalities of gait and mobility: Secondary | ICD-10-CM | POA: Diagnosis not present

## 2024-01-04 ENCOUNTER — Ambulatory Visit (INDEPENDENT_AMBULATORY_CARE_PROVIDER_SITE_OTHER): Payer: Self-pay

## 2024-01-04 DIAGNOSIS — J309 Allergic rhinitis, unspecified: Secondary | ICD-10-CM | POA: Diagnosis not present

## 2024-01-08 DIAGNOSIS — R2689 Other abnormalities of gait and mobility: Secondary | ICD-10-CM | POA: Diagnosis not present

## 2024-01-08 DIAGNOSIS — R269 Unspecified abnormalities of gait and mobility: Secondary | ICD-10-CM | POA: Diagnosis not present

## 2024-01-15 ENCOUNTER — Institutional Professional Consult (permissible substitution) (INDEPENDENT_AMBULATORY_CARE_PROVIDER_SITE_OTHER): Admitting: Otolaryngology

## 2024-01-16 ENCOUNTER — Ambulatory Visit (INDEPENDENT_AMBULATORY_CARE_PROVIDER_SITE_OTHER): Admitting: Otolaryngology

## 2024-01-16 ENCOUNTER — Encounter (INDEPENDENT_AMBULATORY_CARE_PROVIDER_SITE_OTHER): Payer: Self-pay | Admitting: Otolaryngology

## 2024-01-16 VITALS — BP 121/80 | HR 85 | Temp 98.3°F

## 2024-01-16 DIAGNOSIS — J3089 Other allergic rhinitis: Secondary | ICD-10-CM

## 2024-01-16 DIAGNOSIS — J320 Chronic maxillary sinusitis: Secondary | ICD-10-CM

## 2024-01-16 DIAGNOSIS — J341 Cyst and mucocele of nose and nasal sinus: Secondary | ICD-10-CM

## 2024-01-16 MED ORDER — AMOXICILLIN-POT CLAVULANATE 875-125 MG PO TABS: 1.0000 | ORAL_TABLET | Freq: Two times a day (BID) | ORAL | 0 refills | Status: AC

## 2024-01-16 NOTE — Progress Notes (Signed)
 Reason for Consult: Rhinitis Referring Physician: Dr. Iva Ade Jaleal Schliep. is an 63 y.o. male.  HPI: History since 2022 of mucus in his nose.  He did get COVID twice.  He has had persistent mucus that is not colored or purulent.  He has a pretty open nose.  The congestion switches from one side to the other.  He said a dizzy problem for years and was diagnosed with an imbalance problem.  No ear symptoms.  It was suggested that the cyst that he has on CT scan in his maxillary sinuses may have something to do with the imbalance.  He had a CT scan that did show mucous retention cyst in both maxillary sinuses inferiorly.  No other sinus disease.  He had a turbinate reduction by Dr. Karis about 2 years ago.  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)   Dizziness    Hypercholesterolemia    Hypertension    Neck pain    Shoulder pain    Sleep apnea    uses bipap at home    Past Surgical History:  Procedure Laterality Date   ANKLE SURGERY     BIOPSY  04/21/2022   Procedure: BIOPSY;  Surgeon: Cindie Carlin POUR, DO;  [facility];  Service: Endoscopy;;   BIOPSY  09/15/2022   Procedure: BIOPSY;  Surgeon: Cindie Carlin POUR, DO;  [facility];  Service: Endoscopy;;   brain tumor removal  2014   CARPAL TUNNEL RELEASE Left 08/15/2021   Procedure: LEFT CARPAL TUNNEL RELEASE;  Surgeon: Barbarann Oneil BROCKS, MD;  Location: [facility];  Service: Orthopedics;  Laterality: Left;   CATARACT EXTRACTION W/PHACO Right 10/13/2022   Procedure: CATARACT EXTRACTION PHACO AND INTRAOCULAR LENS PLACEMENT (IOC);  Surgeon: Juli Blunt, MD;  Location: [facility];  Service: Ophthalmology;  Laterality: Right;  CDE 2.87   CATARACT EXTRACTION W/PHACO Left 10/27/2022   Procedure: CATARACT EXTRACTION PHACO AND INTRAOCULAR LENS PLACEMENT (IOC);  Surgeon: Juli Blunt, MD;  Location: [facility];  Service: Ophthalmology;   Laterality: Left;  CDE 3.07   COLONOSCOPY WITH PROPOFOL  N/A 01/13/2019   Procedure: COLONOSCOPY WITH PROPOFOL ;  Surgeon: Shaaron Lamar HERO, MD;  20 mm polyp found in the cecum that was removed via piecemeal hot snare polypectomy. Repeat in 4 months. path tubulovillous adenoma, negative for high-grade dysplasia   COLONOSCOPY WITH PROPOFOL  N/A 01/20/2019   Procedure: COLONOSCOPY WITH PROPOFOL ;  Surgeon: Harvey Margo CROME, MD; large defect (2 cm x 1.5 cm with small visible vessel with stigmata of recent bleed) in the cecum.  3 hemostatic clips placed (MR conditional).  One 3 mm polyp in the cecum, tortuous colon, external and internal hemorrhoids.  Pathology revealed tubular adenoma.   COLONOSCOPY WITH PROPOFOL  N/A 10/06/2021   Procedure: COLONOSCOPY WITH PROPOFOL ;  Surgeon: Cindie Carlin POUR, DO;  [facility];  Service: Endoscopy;  Laterality: N/A;  7:30am   ESOPHAGOGASTRODUODENOSCOPY (EGD) WITH PROPOFOL  N/A 04/21/2022   Procedure: ESOPHAGOGASTRODUODENOSCOPY (EGD) WITH PROPOFOL ;  Surgeon: Cindie Carlin POUR, DO;  [facility];  Service: Endoscopy;  Laterality: N/A;  145pm, asa 3, pt can't come earlier - transportation   ESOPHAGOGASTRODUODENOSCOPY (EGD) WITH PROPOFOL  N/A 09/15/2022   Procedure: ESOPHAGOGASTRODUODENOSCOPY (EGD) WITH PROPOFOL ;  Surgeon: Cindie Carlin POUR, DO;  [facility];  Service: Endoscopy;  Laterality: N/A;  8:00 am , asa 3   HERNIA REPAIR  2010  umbilical   NASAL SEPTOPLASTY W/ TURBINOPLASTY Bilateral 11/30/2020   Procedure: NASAL SEPTOPLASTY WITH BILATERAL  TURBINATE REDUCTION;  Surgeon: Karis Clunes, MD;  Location: [facility];  Service: ENT;  Laterality: Bilateral;   POLYPECTOMY  01/13/2019   Procedure: POLYPECTOMY;  Surgeon: Shaaron Lamar HERO, MD;  [facility];  Service: Endoscopy;;  Large Cecal Polyp    POLYPECTOMY  10/06/2021   Procedure: POLYPECTOMY;  Surgeon: Cindie Carlin POUR, DO;  [facility];  Service:  Endoscopy;;   POLYPECTOMY  09/15/2022   Procedure: POLYPECTOMY;  Surgeon: Cindie Carlin POUR, DO;  [facility];  Service: Endoscopy;;   SINOSCOPY     ULNAR TUNNEL RELEASE Left 07/19/2022   Procedure: LEFT CUBITAL TUNNEL RELEASE;  Surgeon: Barbarann Oneil BROCKS, MD;  [facility];  Service: Orthopedics;  Laterality: Left;    Family History  Problem Relation Age of Onset   Thyroid  disease Mother    COPD Father    Cancer Maternal Aunt    Cancer Maternal Uncle        back   Cancer Paternal Uncle        throat    Cancer Cousin        prostate   Colon cancer Neg Hx    Colon polyps Neg Hx    Allergic rhinitis Neg Hx    Angioedema Neg Hx    Asthma Neg Hx    Eczema Neg Hx     Social History:  reports that he quit smoking about 40 years ago. His smoking use included cigarettes. He started smoking about 42 years ago. He has a 1 pack-year smoking history. He has been exposed to tobacco smoke. He has never used smokeless tobacco. He reports that he does not currently use alcohol. He reports that he does not use drugs.  Allergies:  Allergies  Allergen Reactions   Methocarbamol  Other (See Comments)    Twitching in sleep    Medications: I have reviewed the patient's current medications.  No results found for this or any previous visit (from the past 48 hours).  No results found.  ROS Blood pressure 121/80, pulse 85, temperature 98.3 F (36.8 C), SpO2 95%. Physical Exam Constitutional:      Appearance: Normal appearance.  HENT:     Head: Normocephalic and atraumatic.     Right Ear: Tympanic membrane is without lesions and middle ear aerated, ear canal and external ear normal.     Left Ear: Tympanic membrane is without lesions and middle ear aerated, ear canal and external ear normal.     Nose: Nose normal. Turbinates with mild hypertrophy, No significant swelling or masses.     Oral cavity/oropharynx: Mucous membranes are moist. No lesions or masses     Larynx: normal voice. Mirror attempted without success    Eyes:     Extraocular Movements: Extraocular movements intact.     Conjunctiva/sclera: Conjunctivae normal.     Pupils: Pupils are equal, round, and reactive to light.  Cardiovascular:     Rate and Rhythm: Normal rate.  Pulmonary:     Effort: Pulmonary effort is normal.  Musculoskeletal:     Cervical back: Normal range of motion and neck supple. No rigidity.  Lymphadenopathy:     Cervical: No cervical adenopathy or masses.salivary glands without lesions. .  Neurological:     Mental Status: He is alert. CN 2-12 intact. No nystagmus      Assessment/Plan: Sinusitis-his CT scan does show  some mucous retention cyst which necessarily are not sinusitis but could be evidence of previous.  He has a lot of mucus but is not purulent.  I would suggest we try some Augmentin to see if this helps him.  He cannot take nasal steroid sprays because they cause nosebleeds.  He is already seen an allergist so he is on shots.  He is on an antihistamine.  I talked to him about surgery on the cyst but I do not think that is the right thing to do particularly now.  I also do not think these have anything to do with his imbalance problem and he needs to see a neurologist.  Norleen Notice 01/16/2024, 11:16 AM

## 2024-01-22 DIAGNOSIS — R269 Unspecified abnormalities of gait and mobility: Secondary | ICD-10-CM | POA: Diagnosis not present

## 2024-01-22 DIAGNOSIS — R2689 Other abnormalities of gait and mobility: Secondary | ICD-10-CM | POA: Diagnosis not present

## 2024-01-24 ENCOUNTER — Ambulatory Visit: Admitting: Nutrition

## 2024-01-25 ENCOUNTER — Ambulatory Visit (INDEPENDENT_AMBULATORY_CARE_PROVIDER_SITE_OTHER): Payer: Self-pay

## 2024-01-25 DIAGNOSIS — J309 Allergic rhinitis, unspecified: Secondary | ICD-10-CM

## 2024-01-29 NOTE — Progress Notes (Signed)
 "  Established Patient Pulmonology Office Visit   Subjective:  Patient ID: Micheal Lohmeyer., male    DOB: Oct 10, 1960  MRN: [id number]  CC:  Chief Complaint  Patient presents with   Sleep Apnea    Needing new cpap machine 54/64 year old      HPI  Mr. Hodes is a 63 y/o M with a PMH significant for OSA who presents for follow up.  He was last seen by Dr. Shellia on 07/2022, at the time, was doing well on BiPAP 18/13 cm H2O.   Discussed the use of AI scribe software for clinical note transcription with the patient, who gave verbal consent to proceed.  History of Present Illness Micheal Grey. is a 63 year old male with sleep apnea who presents for evaluation of BiPAP usage and machine concerns.  He has been using a BiPAP machine for his sleep apnea, wearing it during sleep and while relaxing or watching TV. He typically sleeps from 9 PM to 8 AM, achieving 8 to 10 hours of sleep per night. He previously used the BiPAP for approximately 17 to 18 hours a day, but has since reduced usage after being advised to cut back. His machine recently indicated it has less than 1,000 hours left, raising concerns about its longevity. His residual AHI is 0.2, and he feels refreshed in the morning after using the BiPAP.  He has a history of high blood pressure and hypercholesterolemia, for which he takes amlodipine , lisinopril , and a cholesterol medication. He has experienced balance and dizziness issues, currently undergoing therapy. He attributes these symptoms to a period of uncontrolled blood pressure, coinciding with two COVID-19 infections. He has lost 20 pounds in the last four months and hopes continued weight loss will further improve his blood pressure and sleep apnea.  He does not smoke, drink alcohol , or use drugs. 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 He has received four COVID-19 vaccinations and a flu  shot this year.  His family history includes an uncle with severe sleep apnea despite being underweight, who is currently battling cancer.     12/09/2021    9:00 AM  Results of the Epworth flowsheet  Sitting and reading 2  Watching TV 2  Sitting, inactive in a public place (e.g. a theatre or a meeting) 1  As a passenger in a car for an hour without a break 1  Lying down to rest in the afternoon when circumstances permit 3  Sitting and talking to someone 1  Sitting quietly after a lunch without alcohol  0  In a car, while stopped for a few minutes in traffic 0  Total score 10    ROS    Current Outpatient Medications:    amLODipine  (NORVASC ) 10 MG tablet, Take 1 tablet (10 mg total) by mouth daily., Disp: 90 tablet, Rfl: 3   cetirizine  (ZYRTEC ) 10 MG tablet, TAKE 1 TABLET BY MOUTH EVERY DAY, Disp: 90 tablet, Rfl: 2   EPINEPHrine  (EPIPEN  2-PAK) 0.3 mg/0.3 mL IJ SOAJ injection, Inject 0.3 mg into the muscle as needed for anaphylaxis., Disp: 2 each, Rfl: 1   latanoprost (XALATAN) 0.005 % ophthalmic solution, SMARTSIG:In Eye(s), Disp: , Rfl:    lisinopril  (ZESTRIL ) 20 MG tablet, TAKE 1 TABLET BY MOUTH EVERY DAY, Disp: 30 tablet, Rfl: 6   Olopatadine  HCl 0.2 % SOLN, Apply 1 drop to eye daily as needed., Disp: 2.5  mL, Rfl: 5   pregabalin  (LYRICA ) 150 MG capsule, Take 1 capsule (150 mg total) by mouth 2 (two) times daily., Disp: 120 capsule, Rfl: 2   rosuvastatin  (CRESTOR ) 5 MG tablet, Take 1 tablet (5 mg total) by mouth daily., Disp: 90 tablet, Rfl: 1   timolol (TIMOPTIC) 0.5 % ophthalmic solution, Place 1 drop into the left eye 2 (two) times daily., Disp: , Rfl:    amoxicillin -clavulanate (AUGMENTIN ) 875-125 MG tablet, Take 1 tablet by mouth 2 (two) times daily., Disp: 20 tablet, Rfl: 0      Objective:  BP 132/83   Pulse 87   Ht 5' 8.9 (1.75 m)   Wt (!) 313 lb (142 kg)   SpO2 96% Comment: ra  BMI 46.36 kg/m  Wt Readings from Last 3 Encounters:  01/30/24 (!) 313 lb (142 kg)   12/14/23 (!) 307 lb 12.8 oz (139.6 kg)  12/06/23 (!) 309 lb 3.2 oz (140.3 kg)   BMI Readings from Last 3 Encounters:  01/30/24 46.36 kg/m  12/14/23 45.59 kg/m  12/06/23 44.37 kg/m   SpO2 Readings from Last 3 Encounters:  01/30/24 96%  01/16/24 95%  12/14/23 95%   Physical Exam General: NAD, alert, WD, WN Eyes: PERRL, no scleral icterus ENMT: oropharynx clear, good dentition, no oral lesions, mallampati score IV Skin: warm, intact, no rashes Neck: JVD flat, neck circ 18 inches CV: RRR, no MRG, nl S1 and S2, no peripheral edema Resp: clear to auscultation bilaterally, no wheezes, rales, or rhonchi, normal effort, no clubbing/cyanosis Neuro: Awake alert oriented to person place time and situation  Diagnostic Review:  Last CBC Lab Results  Component Value Date   WBC 4.1 04/01/2022   HGB 13.9 04/01/2022   HCT 39.7 04/01/2022   MCV 91.3 04/01/2022   MCH 32.0 04/01/2022   RDW 12.5 04/01/2022   PLT 182 04/01/2022   Last metabolic panel Lab Results  Component Value Date   GLUCOSE 105 (H) 07/18/2022   NA 136 07/18/2022   K 3.8 07/18/2022   CL 98 07/18/2022   CO2 28 07/18/2022   BUN 11 07/18/2022   CREATININE 1.24 07/18/2022   GFRNONAA >60 07/18/2022   CALCIUM  9.2 07/18/2022   PROT 7.5 04/01/2022   ALBUMIN 4.5 04/01/2022   LABGLOB 2.8 03/31/2019   AGRATIO 1.7 03/31/2019   BILITOT 0.6 04/01/2022   ALKPHOS 61 04/01/2022   AST 20 04/01/2022   ALT 22 04/01/2022   ANIONGAP 10 07/18/2022    PSG 03/28/19 >> AHI 105.6, SpO2 low 69%   CPAP titration 06/2019    Assessment & Plan:   Assessment & Plan OSA treated with BiPAP Assessment and Plan Assessment & Plan Obstructive sleep apnea managed with BiPAP therapy Obstructive sleep apnea well-managed with BiPAP. The patient is a 100% compliant to therapy. Residual AHI 0.2. Recent weight loss may improve condition. BiPAP nearing end of usage cycle. - Order new BiPAP machine from [facility]. - Advised BiPAP use only  during sleep. - Encouraged continued weight loss.  Orders Placed This Encounter  Procedures   AMB REFERRAL FOR DME   I spent 20 minutes reviewing patient's chart including prior consultant notes, imaging, and PFTs as well as face-to-face with the patient, over half in discussion of the diagnosis and the importance of compliance with the treatment plan.  Return in about 6 months (around 07/29/2024).   Micheal Saylor, MD "

## 2024-01-30 ENCOUNTER — Encounter: Payer: Self-pay | Admitting: Pulmonary Disease

## 2024-01-30 ENCOUNTER — Ambulatory Visit: Admitting: Pulmonary Disease

## 2024-01-30 VITALS — BP 132/83 | HR 87 | Ht 68.9 in | Wt 313.0 lb

## 2024-01-30 DIAGNOSIS — G4733 Obstructive sleep apnea (adult) (pediatric): Secondary | ICD-10-CM

## 2024-01-30 NOTE — Patient Instructions (Signed)
  VISIT SUMMARY: Today, we discussed your BiPAP usage for sleep apnea and addressed concerns about your current machine. Your sleep apnea is well-managed with a residual AHI of 0.2, and your recent weight loss is promising for further improvement.  YOUR PLAN: OBSTRUCTIVE SLEEP APNEA: Your sleep apnea is well-managed with your current BiPAP machine, but it is nearing the end of its usage cycle. -We will order a new BiPAP machine from [facility]. -Use the BiPAP machine only during sleep. -Continue with your weight loss efforts as it may further improve your condition.  Contains text generated by [employer].

## 2024-02-05 DIAGNOSIS — R2689 Other abnormalities of gait and mobility: Secondary | ICD-10-CM | POA: Diagnosis not present

## 2024-02-05 DIAGNOSIS — R269 Unspecified abnormalities of gait and mobility: Secondary | ICD-10-CM | POA: Diagnosis not present

## 2024-02-08 ENCOUNTER — Telehealth: Payer: Self-pay

## 2024-02-08 NOTE — Telephone Encounter (Signed)
 Discussed with Dr. Jude. He notes that even if machine alarms that it has < 1000 hours on it, the machine will continue to work as the patient is using it. I called the patient and informed him to ignore the alarm and to continue to use it as it is still working. The patient noted understanding. I told him if the machine breaks down, to reach out to us .

## 2024-02-08 NOTE — Telephone Encounter (Signed)
 Copied from CRM [id number]. Topic: Clinical - Order For Equipment >> Feb 07, 2024  9:04 AM Ismael A wrote: Reason for CRM: patient states Dr. Catherine sent a RX for a new Bipap since the one he was using has 2 weeks left of life, he states [facility] advised him that his insurance won't cover a new machine until March 2027 - was advised by provider to call back if he had any issues with the new order - please call back [phone]  Please advise (I figured that was going to be the case with this pt)

## 2024-02-12 DIAGNOSIS — R269 Unspecified abnormalities of gait and mobility: Secondary | ICD-10-CM | POA: Diagnosis not present

## 2024-02-12 DIAGNOSIS — R2689 Other abnormalities of gait and mobility: Secondary | ICD-10-CM | POA: Diagnosis not present

## 2024-02-15 ENCOUNTER — Ambulatory Visit

## 2024-02-15 DIAGNOSIS — J302 Other seasonal allergic rhinitis: Secondary | ICD-10-CM

## 2024-02-15 DIAGNOSIS — J309 Allergic rhinitis, unspecified: Secondary | ICD-10-CM

## 2024-02-21 ENCOUNTER — Encounter: Admitting: Nutrition

## 2024-02-22 ENCOUNTER — Ambulatory Visit (INDEPENDENT_AMBULATORY_CARE_PROVIDER_SITE_OTHER)

## 2024-02-22 DIAGNOSIS — J302 Other seasonal allergic rhinitis: Secondary | ICD-10-CM

## 2024-02-22 DIAGNOSIS — J309 Allergic rhinitis, unspecified: Secondary | ICD-10-CM

## 2024-03-06 ENCOUNTER — Other Ambulatory Visit: Payer: Self-pay | Admitting: Allergy & Immunology

## 2024-03-06 ENCOUNTER — Other Ambulatory Visit: Payer: Self-pay | Admitting: Cardiology

## 2024-03-08 ENCOUNTER — Ambulatory Visit: Payer: Self-pay | Admitting: Cardiology

## 2024-03-10 ENCOUNTER — Encounter: Payer: Self-pay | Admitting: *Deleted

## 2024-03-12 ENCOUNTER — Encounter: Admitting: Nutrition

## 2024-03-19 ENCOUNTER — Ambulatory Visit: Admitting: Allergy & Immunology

## 2024-04-01 NOTE — Patient Instructions (Incomplete)
 Allergic rhinitis Continue allergen avoidance measures directed toward tree pollen and mold as listed below Continue allergen immunotherapy and have access to an epinephrine  autoinjector set Continue cetirizine  10 mg once a day if needed for runny nose or itch.  You may take an additional dose of cetirizine  10 mg once a day if needed for breakthrough symptoms  Epistaxis Pinch both nostrils while leaning forward for at least 5 minutes before checking to see if the bleeding has stopped. If bleeding is not controlled within 5-10 minutes apply a cotton ball soaked with oxymetazoline  (Afrin) to the bleeding nostril for a few seconds.  If the problem persists or worsens a referral to ENT for further evaluation may be necessary.  Headache Continue to follow-up with neurology as recommended  Nasal polyposis Continue follow-up with your ENT specialist as recommended  Call the clinic if this treatment plan is not working well for you.  Follow up in *** or sooner if needed.  Reducing Pollen Exposure The American Academy of Allergy , Asthma and Immunology suggests the following steps to reduce your exposure to pollen during allergy  seasons. Do not hang sheets or clothing out to dry; pollen may collect on these items. Do not mow lawns or spend time around freshly cut grass; mowing stirs up pollen. Keep windows closed at night.  Keep car windows closed while driving. Minimize morning activities outdoors, a time when pollen counts are usually at their highest. Stay indoors as much as possible when pollen counts or humidity is high and on windy days when pollen tends to remain in the air longer. Use air conditioning when possible.  Many air conditioners have filters that trap the pollen spores. Use a HEPA room air filter to remove pollen form the indoor air you breathe.  Control of Mold Allergen Mold and fungi can grow on a variety of surfaces provided certain temperature and moisture conditions exist.   Outdoor molds grow on plants, decaying vegetation and soil.  The major outdoor mold, Alternaria and Cladosporium, are found in very high numbers during hot and dry conditions.  Generally, a late Summer - Fall peak is seen for common outdoor fungal spores.  Rain will temporarily lower outdoor mold spore count, but counts rise rapidly when the rainy period ends.  The most important indoor molds are Aspergillus and Penicillium.  Dark, humid and poorly ventilated basements are ideal sites for mold growth.  The next most common sites of mold growth are the bathroom and the kitchen.  Outdoor [employer] Use air conditioning and keep windows closed Avoid exposure to decaying vegetation. Avoid leaf raking. Avoid grain handling. Consider wearing a face mask if working in moldy areas.  Indoor Mold Control Maintain humidity below 50%. Clean washable surfaces with 5% bleach solution. Remove sources e.g. Contaminated carpets. There could be air coming out of carbon hibernation time a mile into early fall crushing stool crushing and not really trying to have something caught a like a hot pepper because my throat the fifth okay

## 2024-04-02 ENCOUNTER — Ambulatory Visit: Admitting: Family Medicine

## 2024-04-04 NOTE — Telephone Encounter (Signed)
BP's look good, no changes   J Erinne Gillentine MD 

## 2024-10-13 ENCOUNTER — Ambulatory Visit: Admitting: Adult Health
